# Patient Record
Sex: Female | Born: 1950
Health system: Southern US, Community
[De-identification: ages and names within clinical notes are randomized; demographics above are authoritative.]

## PROBLEM LIST (undated history)

## (undated) DIAGNOSIS — B019 Varicella without complication: Secondary | ICD-10-CM

## (undated) DIAGNOSIS — J189 Pneumonia, unspecified organism: Secondary | ICD-10-CM

## (undated) DIAGNOSIS — Z9889 Other specified postprocedural states: Secondary | ICD-10-CM

## (undated) DIAGNOSIS — K449 Diaphragmatic hernia without obstruction or gangrene: Secondary | ICD-10-CM

## (undated) DIAGNOSIS — K8020 Calculus of gallbladder without cholecystitis without obstruction: Secondary | ICD-10-CM

## (undated) DIAGNOSIS — I10 Essential (primary) hypertension: Secondary | ICD-10-CM

## (undated) DIAGNOSIS — T7840XA Allergy, unspecified, initial encounter: Secondary | ICD-10-CM

## (undated) DIAGNOSIS — D649 Anemia, unspecified: Secondary | ICD-10-CM

## (undated) DIAGNOSIS — K589 Irritable bowel syndrome without diarrhea: Secondary | ICD-10-CM

## (undated) DIAGNOSIS — K219 Gastro-esophageal reflux disease without esophagitis: Secondary | ICD-10-CM

## (undated) DIAGNOSIS — I272 Pulmonary hypertension, unspecified: Secondary | ICD-10-CM

## (undated) DIAGNOSIS — J302 Other seasonal allergic rhinitis: Secondary | ICD-10-CM

## (undated) DIAGNOSIS — K635 Polyp of colon: Secondary | ICD-10-CM

## (undated) DIAGNOSIS — M797 Fibromyalgia: Secondary | ICD-10-CM

## (undated) DIAGNOSIS — E039 Hypothyroidism, unspecified: Secondary | ICD-10-CM

## (undated) DIAGNOSIS — E041 Nontoxic single thyroid nodule: Secondary | ICD-10-CM

## (undated) DIAGNOSIS — Z91018 Allergy to other foods: Secondary | ICD-10-CM

## (undated) DIAGNOSIS — H269 Unspecified cataract: Secondary | ICD-10-CM

## (undated) DIAGNOSIS — E7800 Pure hypercholesterolemia, unspecified: Secondary | ICD-10-CM

## (undated) DIAGNOSIS — I499 Cardiac arrhythmia, unspecified: Secondary | ICD-10-CM

## (undated) DIAGNOSIS — K625 Hemorrhage of anus and rectum: Secondary | ICD-10-CM

## (undated) DIAGNOSIS — M1990 Unspecified osteoarthritis, unspecified site: Secondary | ICD-10-CM

## (undated) DIAGNOSIS — G43909 Migraine, unspecified, not intractable, without status migrainosus: Secondary | ICD-10-CM

## (undated) DIAGNOSIS — M8580 Other specified disorders of bone density and structure, unspecified site: Secondary | ICD-10-CM

## (undated) DIAGNOSIS — K5790 Diverticulosis of intestine, part unspecified, without perforation or abscess without bleeding: Secondary | ICD-10-CM

## (undated) DIAGNOSIS — R1310 Dysphagia, unspecified: Secondary | ICD-10-CM

## (undated) DIAGNOSIS — R112 Nausea with vomiting, unspecified: Secondary | ICD-10-CM

## (undated) HISTORY — DX: Dysphagia, unspecified: R13.10

## (undated) HISTORY — DX: Diaphragmatic hernia without obstruction or gangrene: K44.9

## (undated) HISTORY — PX: TONSILLECTOMY: SUR1361

## (undated) HISTORY — DX: Allergy to other foods: Z91.018

## (undated) HISTORY — DX: Nontoxic single thyroid nodule: E04.1

## (undated) HISTORY — DX: Diverticulosis of intestine, part unspecified, without perforation or abscess without bleeding: K57.90

## (undated) HISTORY — DX: Hemorrhage of anus and rectum: K62.5

## (undated) HISTORY — DX: Varicella without complication: B01.9

## (undated) HISTORY — PX: COLONOSCOPY: SHX174

## (undated) HISTORY — DX: Unspecified cataract: H26.9

## (undated) HISTORY — DX: Calculus of gallbladder without cholecystitis without obstruction: K80.20

## (undated) HISTORY — PX: COLON SURGERY: SHX602

## (undated) HISTORY — DX: Pulmonary hypertension, unspecified: I27.20

## (undated) HISTORY — PX: UPPER GI ENDOSCOPY: SHX6162

## (undated) HISTORY — DX: Other seasonal allergic rhinitis: J30.2

## (undated) HISTORY — DX: Gastro-esophageal reflux disease without esophagitis: K21.9

## (undated) HISTORY — PX: UPPER GASTROINTESTINAL ENDOSCOPY: SHX188

## (undated) HISTORY — DX: Other specified disorders of bone density and structure, unspecified site: M85.80

## (undated) HISTORY — DX: Unspecified osteoarthritis, unspecified site: M19.90

## (undated) HISTORY — DX: Pure hypercholesterolemia, unspecified: E78.00

## (undated) HISTORY — DX: Anemia, unspecified: D64.9

## (undated) HISTORY — DX: Migraine, unspecified, not intractable, without status migrainosus: G43.909

## (undated) HISTORY — DX: Polyp of colon: K63.5

## (undated) HISTORY — DX: Irritable bowel syndrome, unspecified: K58.9

## (undated) HISTORY — PX: ABDOMINAL HYSTERECTOMY: SHX81

## (undated) HISTORY — DX: Essential (primary) hypertension: I10

## (undated) HISTORY — PX: SMALL INTESTINE SURGERY: SHX150

## (undated) HISTORY — DX: Allergy, unspecified, initial encounter: T78.40XA

## (undated) HISTORY — DX: Hypothyroidism, unspecified: E03.9

## (undated) HISTORY — PX: OTHER SURGICAL HISTORY: SHX169

## (undated) HISTORY — DX: Fibromyalgia: M79.7

## (undated) HISTORY — PX: CATHETER REMOVAL: SHX911

---
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
## 1972-10-14 HISTORY — PX: TUBAL LIGATION: SHX77

## 1980-10-14 HISTORY — PX: APPENDECTOMY: SHX54

## 1986-10-14 HISTORY — PX: TOTAL ABDOMINAL HYSTERECTOMY W/ BILATERAL SALPINGOOPHORECTOMY: SHX83

## 1988-10-14 HISTORY — PX: COLON RESECTION: SHX5231

## 1989-10-14 HISTORY — PX: BREAST LUMPECTOMY: SHX2

## 1993-10-14 HISTORY — PX: BREAST EXCISIONAL BIOPSY: SUR124

## 2002-10-14 HISTORY — PX: NASAL SEPTUM SURGERY: SHX37

## 2003-05-12 ENCOUNTER — Encounter (INDEPENDENT_AMBULATORY_CARE_PROVIDER_SITE_OTHER): Payer: Self-pay

## 2003-05-12 ENCOUNTER — Ambulatory Visit (HOSPITAL_COMMUNITY): Admission: RE | Admit: 2003-05-12 | Discharge: 2003-05-12 | Payer: Self-pay | Admitting: Gastroenterology

## 2004-05-15 ENCOUNTER — Ambulatory Visit: Admission: RE | Admit: 2004-05-15 | Discharge: 2004-05-15 | Payer: Self-pay | Admitting: Pulmonary Disease

## 2004-06-07 ENCOUNTER — Ambulatory Visit (HOSPITAL_COMMUNITY): Admission: RE | Admit: 2004-06-07 | Discharge: 2004-06-07 | Payer: Self-pay | Admitting: Neurosurgery

## 2004-10-14 HISTORY — PX: CARPAL TUNNEL RELEASE: SHX101

## 2005-03-06 ENCOUNTER — Ambulatory Visit (HOSPITAL_COMMUNITY): Admission: RE | Admit: 2005-03-06 | Discharge: 2005-03-06 | Payer: Self-pay | Admitting: Gastroenterology

## 2005-03-06 ENCOUNTER — Encounter (INDEPENDENT_AMBULATORY_CARE_PROVIDER_SITE_OTHER): Payer: Self-pay | Admitting: Specialist

## 2005-04-05 ENCOUNTER — Encounter: Admission: RE | Admit: 2005-04-05 | Discharge: 2005-04-05 | Payer: Self-pay | Admitting: Gastroenterology

## 2005-09-12 ENCOUNTER — Other Ambulatory Visit: Admission: RE | Admit: 2005-09-12 | Discharge: 2005-09-12 | Payer: Self-pay | Admitting: Obstetrics and Gynecology

## 2006-02-27 ENCOUNTER — Encounter: Admission: RE | Admit: 2006-02-27 | Discharge: 2006-02-27 | Payer: Self-pay | Admitting: Gastroenterology

## 2006-05-01 ENCOUNTER — Encounter: Admission: RE | Admit: 2006-05-01 | Discharge: 2006-05-01 | Payer: Self-pay | Admitting: Neurosurgery

## 2006-05-01 HISTORY — PX: OTHER SURGICAL HISTORY: SHX169

## 2006-11-17 ENCOUNTER — Other Ambulatory Visit: Admission: RE | Admit: 2006-11-17 | Discharge: 2006-11-17 | Payer: Self-pay | Admitting: Family Medicine

## 2007-04-15 ENCOUNTER — Encounter: Admission: RE | Admit: 2007-04-15 | Discharge: 2007-04-15 | Payer: Self-pay | Admitting: Otolaryngology

## 2008-12-09 ENCOUNTER — Encounter: Admission: RE | Admit: 2008-12-09 | Discharge: 2008-12-09 | Payer: Self-pay | Admitting: Otolaryngology

## 2009-02-10 ENCOUNTER — Encounter: Admission: RE | Admit: 2009-02-10 | Discharge: 2009-02-10 | Payer: Self-pay | Admitting: Family Medicine

## 2009-12-19 ENCOUNTER — Encounter: Admission: RE | Admit: 2009-12-19 | Discharge: 2009-12-19 | Payer: Self-pay | Admitting: Family Medicine

## 2010-11-04 ENCOUNTER — Encounter: Payer: Self-pay | Admitting: Gastroenterology

## 2010-11-04 ENCOUNTER — Encounter: Payer: Self-pay | Admitting: Otolaryngology

## 2010-11-05 ENCOUNTER — Encounter: Payer: Self-pay | Admitting: Family Medicine

## 2010-11-21 ENCOUNTER — Other Ambulatory Visit: Payer: Self-pay

## 2010-11-21 ENCOUNTER — Other Ambulatory Visit: Payer: Self-pay | Admitting: Family Medicine

## 2010-11-21 DIAGNOSIS — Z1239 Encounter for other screening for malignant neoplasm of breast: Secondary | ICD-10-CM

## 2010-12-25 ENCOUNTER — Ambulatory Visit
Admission: RE | Admit: 2010-12-25 | Discharge: 2010-12-25 | Disposition: A | Discharge status: Home / Self Care | Payer: 59 | Source: Ambulatory Visit

## 2010-12-25 DIAGNOSIS — Z1239 Encounter for other screening for malignant neoplasm of breast: Secondary | ICD-10-CM

## 2011-03-01 NOTE — Op Note (Signed)
NAME:  Rita Levine, Rita Levine                          ACCOUNT NO.:  [id number]   MEDICAL RECORD NO.:  [id number]                   PATIENT TYPE:  OUT   LOCATION:  CARD                                 FACILITY:  [facility]   PHYSICIAN:  Oley Balm. Sung Amabile, M.D. [facility]          DATE OF BIRTH:  December 04, 1950   DATE OF PROCEDURE:  05/15/2004  DATE OF DISCHARGE:  05/15/2004                                 OPERATIVE REPORT   PROCEDURE:  Cardiopulmonary stress test.   INDICATION:  Unexplained dyspnea.   DESCRIPTION OF PROCEDURE:  Cardiopulmonary stress testing was intended to be  performed using the modified Bruce protocol and graded treadmill.  The  subject was able to perform pulmonary function tests but was unable to  maintain the mouthpiece during any exercise and developed a sense of panic  and shortness of breath.  Therefore, no further testing was performed.  Pulmonary function tests do reveal normal spirometry, normal lung volumes,  and normal diffusion capacity.   IMPRESSION:  Unable to perform cardiopulmonary stress testing due to  intolerance to the mouthpiece and due to anxiety.  Consideration could be  given to repeating the test using a face mask rather than a mouth piece.  This will be discussed further with Dr. Jayme Cloud.                                               Oley Balm Sung Amabile, M.D. [facility]    DBS/MEDQ  D:  05/20/2004  T:  05/20/2004  Job:  [id number]   cc:   Danice Goltz, M.D. [facility]

## 2011-03-01 NOTE — Op Note (Signed)
NAMESHANELL, ADEN                ACCOUNT NO.:  [id number]   MEDICAL RECORD NO.:  [id number]          PATIENT TYPE:  AMB   LOCATION:  ENDO                         FACILITY:  [facility]   PHYSICIAN:  Petra Kuba, M.D.    DATE OF BIRTH:  1951-10-10   DATE OF PROCEDURE:  DATE OF DISCHARGE:                                 OPERATIVE REPORT   Audio too short to transcribe (less than 5 seconds)      MEM/MEDQ  D:  03/06/2005  T:  03/06/2005  Job:  [id number]

## 2011-03-01 NOTE — Op Note (Signed)
   NAME:  Rita Levine, Rita Levine                          ACCOUNT NO.:  [id number]   MEDICAL RECORD NO.:  [id number]                   PATIENT TYPE:  AMB   LOCATION:  ENDO                                 FACILITY:  [facility]   PHYSICIAN:  Petra Kuba, M.D.                 DATE OF BIRTH:  [birth year]   DATE OF PROCEDURE:  05/12/2003  DATE OF DISCHARGE:                                 OPERATIVE REPORT   PROCEDURE:  Colonoscopy.   INDICATIONS FOR PROCEDURE:  Multiple abdominal complaints, history of colon  polyps.   Consent was signed after risks, benefits, methods, and options were  thoroughly discussed in the office.   MEDICATIONS:  Demerol 75, Versed 8.   DESCRIPTION OF PROCEDURE:  Rectal inspection was pertinent for external  hemorrhoids, small. Digital exam was negative. The pediatric video  adjustable colonoscope was inserted, fairly easily advanced around the colon  to the level of the ileocecal valve. Unfortunately at that point there was  some looping and to advance to the cecal pole required rolling her on her  back and finally on her right side. Other than melanosis coli and  diverticula, no abnormalities were seen on insertion. The cecum was  identified by the appendiceal orifice and the ileocecal valve. The prep was  fairly adequate. With lots of washing and suctioning, adequate visualization  was obtained. In the cecal pole, a 2 mm polyp was seen and on a low setting  was hot biopsied carefully x1. The scope was further withdrawn. Other than  the left greater than right diverticula and the significant melanosis coli,  no other polypoid lesions were seen as we slowly withdrew back to the  rectum. Anorectal pull-through and retroflexion confirmed the hemorrhoids  which were small. The scope was reinserted a short ways up the  left side of  the colon, air was suctioned, scope removed. The patient tolerated the  procedure well. There was no obvious or immediate complications.   ENDOSCOPIC DIAGNOSIS:  1. Internal and external small hemorrhoids.  2. Melanosis coli throughout.  3. Left greater than right diverticula.  4. Tiny cecal polyp hot biopsied.  5. Otherwise within normal limits to the cecum.   PLAN:  Await pathology and also get her [place] records to try to determine  when the appropriate time is for repeat screening and continue workup in the  meantime with an EGD. Please see that dictation for other recommendations,  workup and plans.                                               Petra Kuba, M.D.    MEM/MEDQ  D:  05/12/2003  T:  05/12/2003  Job:  [id number]   cc:   Okey Regal ______________

## 2011-03-01 NOTE — Op Note (Signed)
   NAME:  THEORA, VANKIRK                          ACCOUNT NO.:  [id number]   MEDICAL RECORD NO.:  [id number]                   PATIENT TYPE:  AMB   LOCATION:  ENDO                                 FACILITY:  [facility]   PHYSICIAN:  Petra Kuba, M.D.                 DATE OF BIRTH:  [birth year]   DATE OF PROCEDURE:  05/12/2003  DATE OF DISCHARGE:                                 OPERATIVE REPORT   PROCEDURE:  Esophagogastroduodenoscopy.   INDICATIONS FOR PROCEDURE:  Chronic upper tract symptoms.   Consent was signed after risks, benefits, methods, and options were  thoroughly discussed in the office. No additional medicines were used for  this procedure since it followed the colonoscopy.   DESCRIPTION OF PROCEDURE:  The video endoscope was inserted by direct  vision, the esophagus was normal, in the distal esophagus was a tiny hiatal  hernia. The scope passed into the stomach, advanced to the antrum where some  minimal antritis was seen and advanced through a normal duodenal bulb,  around the C loop to a normal second portion of the duodenum. Slow  withdrawal back to the bulb confirmed its normal appearance. The scope was  withdrawn back to the stomach which was evaluated on straight and  retroflexed visualization with a good look at the fundus, lesser and greater  curve without additional findings. Air was suctioned, scope slowly  withdrawn. Again a good look at the esophagus was normal, scope was removed.  The patient tolerated the procedure well. There was no obvious or immediate  complication.   ENDOSCOPIC DIAGNOSIS:  1. Tiny hiatal hernia.  2. Minimal antritis.  3. Otherwise normal esophagogastroduodenoscopy.   PLAN:  Continue pump inhibitors, happy to see back p.r.n.  Consideration of  small bowel follow-through or CAT scan if her symptoms continue. Could leave  that to Ms. Womble or happy to orchestrate on followup and see colon  dictation for other workup and plans.                                            Petra Kuba, M.D.    MEM/MEDQ  D:  05/12/2003  T:  05/12/2003  Job:  [id number]   cc:   Katrina Stack, M.D.

## 2011-03-01 NOTE — Op Note (Signed)
Rita Levine, Rita Levine                ACCOUNT NO.:  [id number]   MEDICAL RECORD NO.:  [id number]          PATIENT TYPE:  AMB   LOCATION:  ENDO                         FACILITY:  [facility]   PHYSICIAN:  Petra Kuba, M.D.    DATE OF BIRTH:  1951-05-31   DATE OF PROCEDURE:  03/06/2005  DATE OF DISCHARGE:                                 OPERATIVE REPORT   PROCEDURE:  Colonoscopy with polypectomy.   ENDOSCOPIST:  Petra Kuba, M.D.   INDICATIONS:  Patient with history of colon polyps, increasing abdominal  pain.  Consent was signed after risks, benefits, methods, and options  thoroughly discussed in the office in the past.   MEDICINES USED:  Demerol 75, Versed 8.   DESCRIPTION OF PROCEDURE:  Rectal inspection is pertinent for external  hemorrhoids, small.  A digital exam is negative.  Video pediatric adjustable  colonoscope was inserted and, again, fairly easily advanced around the colon  to the level of the ileocecal valve to advance to the cecal pole required  rolling her on her back and then on her right side with various abdominal  pressures.  The cecum was identified by the appendiceal orifice and the  ileocecal valve.  No obvious abnormality, but some scattered diverticula  were seen on insertion.   The scope was inserted a short ways into the terminal ileum which was  normal.  Photo documentation was obtained.  The scope was slowly withdrawn.  The prep was fairly adequate with lots of washing and suctioning, adequate  visualization was obtained.  On slow withdrawal through the colon noting the  scattered diverticula in the mid transverse colon.  A tiny-to-small polyp  was seen.  Snare electrocautery applied and the polyp was suctioned through  the scope and collected in the trap.  No other polypoid abnormalities were  seen as we slowly withdrew back to the rectum.  Anorectal pull through and  retroflexion confirms some small hemorrhoids.  The scope was straightened  and readvanced  a short ways up the left side of the colon. Air was suctioned  and the scope removed.  The patient tolerated the procedure well.  There  were no obvious immediate complications.   ENDOSCOPIC DIAGNOSIS:  1.  Internal and external hemorrhoids.  2.  Scattered diverticula throughout.  3.  Proximal transverse polyp, status post snare.  4.  Otherwise within normal limits to the terminal ileum.   PLAN:  Await pathology to determine future colonic screening.  Continue  workup with an EGD.      MEM/MEDQ  D:  03/06/2005  T:  03/06/2005  Job:  [id number]   cc:   Petra Kuba, M.D.  [address], [address]  [place]  [place] [address]  Fax: [phone]

## 2011-03-01 NOTE — Op Note (Signed)
Levine, KERN                ACCOUNT NO.:  [id number]   MEDICAL RECORD NO.:  [id number]          PATIENT TYPE:  AMB   LOCATION:  ENDO                         FACILITY:  [facility]   PHYSICIAN:  Petra Kuba, M.D.    DATE OF BIRTH:  11-27-1950   DATE OF PROCEDURE:  03/06/2005  DATE OF DISCHARGE:                                 OPERATIVE REPORT   PROCEDURE:  EGD with biopsy.   INDICATIONS FOR PROCEDURE:  Patient with chronic reflux, want to reevaluate.  Consent was signed after risks, benefits, methods and options were  thoroughly discussed in the office in the past and prior to any premeds  given.   ADDITIONAL MEDICINES FOR THIS PROCEDURE:  Versed 2.   PROCEDURE:  The video endoscope was inserted by direct vision.  Esophagus  was pertinent for some very minimal distal reflux changes but no significant  esophagus or signs of Barrett's.  She might have had a tiny hiatal hernia at  best.  Scope passed into the stomach, advanced to the antrum where some  minimal antritis was seen and advanced through a normal pylorus into a  normal duodenal bulb and around the C loop to a normal second portion of the  duodenum.  Scope was withdrawn back to the vault and a good look there ruled  out ulcers in that location.  Scope was withdrawn back to the stomach and  retroflexed.  Cardia, fundus, angularis, lesser and greater curve were  normal on retroflexed visualization.  Straight visualization in the stomach  did not reveal any additional findings.  Air was suctioned and the scope  slowly withdrawn.  Again, a good look at the esophagus did nontender reveal  any significant abnormalities.  We went ahead and took the few biopsies of  the distal and mid esophagus to rule out any microscopic changes.  Air was  suctioned.  The scope was slowly withdrawn.  Again, a good look at the  esophagus on withdrawal did not reveal any additional findings.  The scope  was removed.  Patient tolerated the procedure  well.  There was  noncontributory obvious immediate complications.   ENDOSCOPIC DIAGNOSES:  1.  Tiny hiatal hernia.  2.  Minimal distal reflux changes, status post biopsy including biopsies of      the mid esophagus which look normal.  3.  Minimal antritis.  4.  Otherwise normal esophagogastroduodenoscopy.   PLAN:  Await pathology.  For her abdominal pain, continue working with small-  bowel follow-through and CAT scan.  Also trial of antispasmodic would be  helpful.  Happy to see back p.r.n. to assist.  Otherwise return care to Dr.  Foy Guadalajara and Dr. Jearld Fenton.      MEM/MEDQ  D:  03/06/2005  T:  03/06/2005  Job:  [id number]   cc:   Molly Maduro L. Foy Guadalajara, M.D.  [address] [place]  [place] [address]  Fax: [phone]   Suzanna Obey, M.D.  [address] [place]  [place] [address]  Fax: [phone]

## 2011-03-01 NOTE — Op Note (Signed)
NAME:  Rita Levine, Rita Levine                          ACCOUNT NO.:  [id number]   MEDICAL RECORD NO.:  [id number]                   PATIENT TYPE:  OIB   LOCATION:  2899                                 FACILITY:  [facility]   PHYSICIAN:  Donalee Citrin, M.D.                     DATE OF BIRTH:  [birth year]   DATE OF PROCEDURE:  06/07/2004  DATE OF DISCHARGE:                                 OPERATIVE REPORT   PREOPERATIVE DIAGNOSIS:  Right carpal tunnel syndrome.   PROCEDURE:  Right carpal tunnel release.   SURGEON:  Donalee Citrin, M.D.   ANESTHESIA:  Bier block.   HISTORY OF PRESENT ILLNESS:  The patient is a very pleasant 60 year old  female who has had longstanding right hand and wrist pain with repetitive  motion.  EMG confirmed moderately severe right carpal tunnel syndrome.  The  patient was refractory to all forms of conservative treatment and was  recommended carpal tunnel release.  I extensively went over the risks and  benefits of surgery with her, and she understands and agreed to proceed  forward.   The patient was brought into the OR, was induced under Bier block  anesthesia.  The right hand was prepped and draped in the usual sterile  fashion.  An incision was made just from the distal wrist crease along the  palmar crease approximately 4 cm long.  The incision was made with a 15  blade scalpel and then the soft tissue was dissected free.  The retinaculum  was immediately identified, and this was incised with a 15 blade scalpel.  Then once the perineural sheath was identified, using a hemostat the  retinaculum was dissected free of the perineural sheath and incised both  proximally and distally.  The hemostats were easily passed both proximally  and distally with no further tension being appreciated.  Once the  retinaculum had been divided, there was no further pressure on the median  nerve.  Care was taken not to violate the perineural sheath.  The wound was  then copiously irrigated.   Meticulous hemostasis was maintained.  The skin  was then reapproximated with an interrupted vertical mattress.  The  tourniquet was released with approximately 35-minute tourniquet time, and  then the wound was dressed.  The patient went to the recovery room in stable  condition.  At the end of the case all needle count and sponge count  __________.                                               Donalee Citrin, M.D.    GC/MEDQ  D:  06/07/2004  T:  06/07/2004  Job:  [id number]

## 2011-03-06 ENCOUNTER — Other Ambulatory Visit: Payer: Self-pay | Admitting: Orthopedic Surgery

## 2011-03-06 ENCOUNTER — Ambulatory Visit
Admission: RE | Admit: 2011-03-06 | Discharge: 2011-03-06 | Disposition: A | Discharge status: Home / Self Care | Payer: 59 | Source: Ambulatory Visit | Attending: Orthopedic Surgery | Admitting: Orthopedic Surgery

## 2011-03-06 DIAGNOSIS — G8929 Other chronic pain: Secondary | ICD-10-CM

## 2011-03-08 ENCOUNTER — Institutional Professional Consult (permissible substitution): Payer: 59 | Admitting: Pulmonary Disease

## 2011-03-29 ENCOUNTER — Encounter: Payer: Self-pay | Admitting: Pulmonary Disease

## 2011-03-29 ENCOUNTER — Encounter: Payer: Self-pay | Admitting: *Deleted

## 2011-04-01 ENCOUNTER — Ambulatory Visit (INDEPENDENT_AMBULATORY_CARE_PROVIDER_SITE_OTHER): Payer: 59 | Admitting: Pulmonary Disease

## 2011-04-01 ENCOUNTER — Encounter: Payer: Self-pay | Admitting: Pulmonary Disease

## 2011-04-01 ENCOUNTER — Institutional Professional Consult (permissible substitution): Payer: 59 | Admitting: Pulmonary Disease

## 2011-04-01 VITALS — BP 118/74 | HR 73 | Temp 98.7°F | Ht 60.0 in | Wt 148.4 lb

## 2011-04-01 DIAGNOSIS — J45909 Unspecified asthma, uncomplicated: Secondary | ICD-10-CM | POA: Insufficient documentation

## 2011-04-01 NOTE — Patient Instructions (Signed)
Continue with advair.  Your spirometry is normal today, indicating the inhaler is doing a good job controlling your airway inflammation. Would recommend getting back with your ENT doctor.  Your asthma will not stay controlled if your sinuses keep flaring.  Stay on reflux meds. Would be happy to see you back if you have pulmonary issues despite control of your sinus disease.

## 2011-04-01 NOTE — Progress Notes (Signed)
  Subjective:    Patient ID: Rita Levine, female    DOB: August 30, 1951, 60 y.o.   MRN: [id number]  HPI The pt is a 60y/o female who I have been asked to see for asthma.  She was diagnosed with asthma about 21yrs ago, and feels the advair controls her symptoms very well unless her sinuses are causing her trouble.  The majority of her flares start with sinus issues.  She is followed by ENT, and most recently has required 2 rounds of abx and a course of prednisone for sinus/chest symptoms.  She is still having sinus pressure, and feels this is still an issue for her.  She feels she has reasonable exercise tolerance, and states that breathing does not limit her.  She has been allergy tested in the past, and currently sees an Administrator, [employer]" in [place].     Review of Systems  Constitutional: Negative for fever and unexpected weight change.  HENT: Positive for ear pain, congestion, sore throat and sneezing. Negative for nosebleeds, rhinorrhea, trouble swallowing, dental problem, postnasal drip and sinus pressure.   Eyes: Negative for redness and itching.  Respiratory: Positive for cough and shortness of breath. Negative for chest tightness and wheezing.   Cardiovascular: Negative for palpitations and leg swelling.  Gastrointestinal: Negative for nausea and vomiting.  Genitourinary: Negative for dysuria.  Musculoskeletal: Negative for joint swelling.  Skin: Negative for rash.  Neurological: Positive for headaches.  Hematological: Does not bruise/bleed easily.  Psychiatric/Behavioral: Negative for dysphoric mood. The patient is not nervous/anxious.        Objective:   Physical Exam Constitutional:  Well developed, no acute distress  HENT:  Nares patent without discharge  Oropharynx without exudate, palate and uvula are normal  Eyes:  Perrla, eomi, no scleral icterus  Neck:  No JVD, no TMG  Cardiovascular:  Normal rate, regular rhythm, no rubs or gallops.  No murmurs        Intact distal  pulses  Pulmonary :  Normal breath sounds, no stridor or respiratory distress   No rales, rhonchi, or wheezing  Abdominal:  Soft, nondistended, bowel sounds present.  No tenderness noted.   Musculoskeletal:  No lower extremity edema noted.  Lymph Nodes:  No cervical lymphadenopathy noted  Skin:  No cyanosis noted  Neurologic:  Alert, appropriate, moves all 4 extremities without obvious deficit.         Assessment & Plan:

## 2011-04-06 NOTE — Assessment & Plan Note (Signed)
The pt feels her current inhaler regimen controls her asthma quite well when her sinus disease is not causing an issue.  Her spirometry today is normal, indicating her asthma is really not an issue right now.  She is still having sinus symptoms despite being treated with 2 rounds of abx and a course of prednisone, and I think she may need ENT evaluation at this point.  She will call for an apptm.  I have discussed with her the role of sinusitis in overall airway inflammation.

## 2011-09-18 ENCOUNTER — Other Ambulatory Visit (INDEPENDENT_AMBULATORY_CARE_PROVIDER_SITE_OTHER): Payer: Self-pay | Admitting: Otolaryngology

## 2011-09-30 ENCOUNTER — Other Ambulatory Visit: Payer: 59

## 2011-10-11 ENCOUNTER — Other Ambulatory Visit: Payer: 59

## 2011-10-11 ENCOUNTER — Ambulatory Visit
Admission: RE | Admit: 2011-10-11 | Discharge: 2011-10-11 | Disposition: A | Discharge status: Home / Self Care | Payer: 59 | Source: Ambulatory Visit | Attending: Otolaryngology | Admitting: Otolaryngology

## 2011-10-23 ENCOUNTER — Other Ambulatory Visit: Payer: Self-pay | Admitting: Family Medicine

## 2011-10-23 DIAGNOSIS — Z1231 Encounter for screening mammogram for malignant neoplasm of breast: Secondary | ICD-10-CM

## 2011-11-08 ENCOUNTER — Other Ambulatory Visit (INDEPENDENT_AMBULATORY_CARE_PROVIDER_SITE_OTHER): Payer: Self-pay | Admitting: Otolaryngology

## 2011-12-31 ENCOUNTER — Ambulatory Visit
Admission: RE | Admit: 2011-12-31 | Discharge: 2011-12-31 | Disposition: A | Discharge status: Home / Self Care | Payer: 59 | Source: Ambulatory Visit | Attending: Family Medicine | Admitting: Family Medicine

## 2011-12-31 DIAGNOSIS — Z1231 Encounter for screening mammogram for malignant neoplasm of breast: Secondary | ICD-10-CM

## 2012-04-13 HISTORY — PX: OTHER SURGICAL HISTORY: SHX169

## 2012-06-12 ENCOUNTER — Other Ambulatory Visit (HOSPITAL_COMMUNITY): Payer: Self-pay | Admitting: Orthopedic Surgery

## 2012-06-12 DIAGNOSIS — M549 Dorsalgia, unspecified: Secondary | ICD-10-CM

## 2012-06-23 ENCOUNTER — Encounter (HOSPITAL_COMMUNITY)
Admission: RE | Admit: 2012-06-23 | Discharge: 2012-06-23 | Disposition: A | Discharge status: Home / Self Care | Payer: 59 | Source: Ambulatory Visit | Attending: Orthopedic Surgery | Admitting: Orthopedic Surgery

## 2012-06-23 DIAGNOSIS — M549 Dorsalgia, unspecified: Secondary | ICD-10-CM

## 2012-06-23 DIAGNOSIS — R079 Chest pain, unspecified: Secondary | ICD-10-CM | POA: Insufficient documentation

## 2012-06-23 MED ORDER — TECHNETIUM TC 99M MEDRONATE IV KIT
25.0000 | PACK | Freq: Once | INTRAVENOUS | Status: AC | PRN
  Administered 2012-06-23: 25 via INTRAVENOUS

## 2012-11-02 ENCOUNTER — Other Ambulatory Visit (HOSPITAL_BASED_OUTPATIENT_CLINIC_OR_DEPARTMENT_OTHER): Payer: Self-pay | Admitting: Family Medicine

## 2012-11-02 DIAGNOSIS — Z1231 Encounter for screening mammogram for malignant neoplasm of breast: Secondary | ICD-10-CM

## 2012-12-31 ENCOUNTER — Ambulatory Visit (HOSPITAL_BASED_OUTPATIENT_CLINIC_OR_DEPARTMENT_OTHER)
Admission: RE | Admit: 2012-12-31 | Discharge: 2012-12-31 | Disposition: A | Discharge status: Home / Self Care | Payer: 59 | Source: Ambulatory Visit | Attending: Family Medicine | Admitting: Family Medicine

## 2012-12-31 DIAGNOSIS — Z1231 Encounter for screening mammogram for malignant neoplasm of breast: Secondary | ICD-10-CM | POA: Insufficient documentation

## 2013-11-26 ENCOUNTER — Other Ambulatory Visit: Payer: Self-pay

## 2013-11-26 DIAGNOSIS — Z1231 Encounter for screening mammogram for malignant neoplasm of breast: Secondary | ICD-10-CM

## 2014-01-03 ENCOUNTER — Ambulatory Visit: Payer: 59

## 2014-01-04 ENCOUNTER — Ambulatory Visit
Admission: RE | Admit: 2014-01-04 | Discharge: 2014-01-04 | Disposition: A | Discharge status: Home / Self Care | Payer: Self-pay | Source: Ambulatory Visit

## 2014-01-04 DIAGNOSIS — Z1231 Encounter for screening mammogram for malignant neoplasm of breast: Secondary | ICD-10-CM

## 2014-04-05 ENCOUNTER — Encounter: Payer: Self-pay | Admitting: Cardiology

## 2014-04-07 ENCOUNTER — Ambulatory Visit (INDEPENDENT_AMBULATORY_CARE_PROVIDER_SITE_OTHER): Payer: 59 | Admitting: Interventional Cardiology

## 2014-04-07 ENCOUNTER — Encounter: Payer: Self-pay | Admitting: Interventional Cardiology

## 2014-04-07 VITALS — BP 126/78 | HR 62 | Ht 60.0 in | Wt 152.0 lb

## 2014-04-07 DIAGNOSIS — R002 Palpitations: Secondary | ICD-10-CM | POA: Insufficient documentation

## 2014-04-07 DIAGNOSIS — R079 Chest pain, unspecified: Secondary | ICD-10-CM

## 2014-04-07 DIAGNOSIS — E782 Mixed hyperlipidemia: Secondary | ICD-10-CM | POA: Insufficient documentation

## 2014-04-07 HISTORY — DX: Chest pain, unspecified: R07.9

## 2014-04-07 NOTE — Progress Notes (Signed)
Patient ID: ADRIEANA Levine, female   DOB: [birth year], 63 y.o.   MRN: [id number]     Patient ID: Rita Levine MRN: [id number] DOB/AGE: 31-Mar-1951 63 y.o.   Referring Physician Dr. Betty Martinique   Reason for Consultation: chest pressure  HPI: 63 y/o woman who has had high cholesterol.  Over the past six months, she has had some chest discomfort and associated arm tingling.  Sx occur about once a week.  They last a few minutes.  No pattern.  She has some nauseated and diaphoresis with the chest discomfort.  No relation to exertion.  No syncope.  Occasional lightheadedness with standing.  She has "bad equilibrium."  She describes palpitations.  Events occur at random-lasting a few seconds.  Rare episodes were she had to sit down in the past.  None recently.  She has had borderline blood sugar.   Mother had CHF.  No early CAD.  Siblings have no early CAD.    Current Outpatient Prescriptions  Medication Sig Dispense Refill  . albuterol (PROAIR HFA) 108 (90 BASE) MCG/ACT inhaler Inhale 2 puffs into the lungs every 6 (six) hours as needed.        Marland Kitchen albuterol (PROVENTIL) (2.5 MG/3ML) 0.083% nebulizer solution Take 2.5 mg by nebulization every 6 (six) hours as needed.        . Ascorbic Acid (VITAMIN C) 1000 MG tablet Take 3,000 mg by mouth daily.       Marland Kitchen atenolol (TENORMIN) 25 MG tablet Take 25 mg by mouth daily.        . Coenzyme Q10 (COQ-10) 100 MG CAPS Take 3 tablets by mouth daily.        Marland Kitchen dexlansoprazole (DEXILANT) 60 MG capsule Take 60 mg by mouth daily.      . Digestive Enzymes (SIMILASE PO) daily.        . fish oil-omega-3 fatty acids 1000 MG capsule Take 1 g by mouth daily.        . fluticasone (FLONASE) 50 MCG/ACT nasal spray Place 2 sprays into the nose daily.        . Fluticasone-Salmeterol (ADVAIR DISKUS) 250-50 MCG/DOSE AEPB Inhale 1 puff into the lungs. Once to twice daily      . Lactobacillus (ACIDOPHILUS PO) Take 1 capsule by mouth daily.        Marland Kitchen loratadine (CLARITIN) 10 MG  tablet Take 10 mg by mouth daily.      . montelukast (SINGULAIR) 10 MG tablet Take 10 mg by mouth at bedtime.      . Multiple Vitamin (MULTIVITAMIN) tablet Take 1 tablet by mouth 2 (two) times daily.       . NON FORMULARY Inhalent drops 3 times daily       . NON FORMULARY Glucose Optimizer 3 tabs daily       . Nutritional Supplements (DHEA PO) Take by mouth. 12.5 mg daily       . Nutritional Supplements (FOOD ALLERGY FORMULA PO) Take by mouth. 3 drops daily       . omeprazole (PRILOSEC) 40 MG capsule Take 40 mg by mouth 2 (two) times daily.        . pregabalin (LYRICA) 50 MG capsule Take 50 mg by mouth. Take 1 to 2 tabs daily      . PROGESTERONE MICRONIZED PO Take 75 mg by mouth daily.        . vitamin B-12 (CYANOCOBALAMIN) 1000 MCG tablet Take 1,000 mcg by mouth daily.        Marland Kitchen  zolpidem (AMBIEN) 10 MG tablet Take 10 mg by mouth at bedtime.         No current facility-administered medications for this visit.   Past Medical History  Diagnosis Date  . Reflux   . Hiatal hernia   . Migraine headache   . Fibromyalgia   . Hypercholesteremia   . Asthma   . Osteoarthritis   . Pulmonary hypertension   . Diverticulosis   . Allergic rhinitis     Family History  Problem Relation Age of Onset  . Hyperlipidemia Father   . Hypertension Father   . Arthritis Father   . Diabetes Father   . Hypertension Mother   . Hyperlipidemia Mother   . Macular degeneration Mother   . Arthritis Mother   . Throat cancer Brother     lung, and tongue  . Diabetes type II Sister   . Lung cancer Sister   . Breast cancer Maternal Aunt   . Lung cancer Other     uncle  . Emphysema Maternal Aunt   . Emphysema Maternal Uncle   . Asthma Father   . Clotting disorder Sister     History   Social History  . Marital Status: Married    Spouse Name: Windy Canny    Number of Children: 2  . Years of Education: N/A   Occupational History  . homemaker    Social History Main Topics  . Smoking status: Never  Smoker   . Smokeless tobacco: Not on file  . Alcohol Use: No  . Drug Use: No  . Sexual Activity: Not on file   Other Topics Concern  . Not on file   Social History Narrative  . No narrative on file    Past Surgical History  Procedure Laterality Date  . Carpal tunnel release  2006    Right wrist  . Appendectomy  1982  . Colon resection  1990  . Breast lumpectomy  1991  . Cesarean section      x 2  . Total abdominal hysterectomy w/ bilateral salpingoophorectomy  1988  . Tubal ligation  1974      (Not in a hospital admission)  Review of systems complete and found to be negative unless listed above .  No nausea, vomiting.  No fever chills, No focal weakness,  Rare palpitations.  Physical Exam: Filed Vitals:   04/07/14 0904  BP: 126/78  Pulse: 62    Weight: 152 lb (68.947 kg)  Physical exam:  /AT EOMI No JVD, No carotid bruit RRR S1S2  No wheezing Soft. NT, nondistended No edema. 2+ PT pulses bilaterally No focal motor or sensory deficits Normal affect  Labs:   No results found for this basename: WBC, HGB, HCT, MCV, PLT   No results found for this basename: NA, K, CL, CO2, BUN, CREATININE, CALCIUM, LABALBU, PROT, BILITOT, ALKPHOS, ALT, AST, GLUCOSE,  in the last 168 hours No results found for this basename: CKTOTAL, CKMB, CKMBINDEX, TROPONINI    No results found for this basename: CHOL   No results found for this basename: HDL   No results found for this basename: LDLCALC   No results found for this basename: TRIG   No results found for this basename: CHOLHDL   No results found for this basename: LDLDIRECT       EKG:  NSR, Q wave in lead 3, no ST segment changes.  ASSESSMENT AND PLAN:   Signed:  1) Chest pain:  Plan ETT.  Atypical chest pain.  Some features concerning for angina.  Ischemia evaluation is reasonable.  2) Hyperlipidemia: LDL 165 after dietary changes.  We discussed lipid-lowering therapy with statin. She is not interested in a  statin at this time. She will increase fish oil to 2 g by mouth twice a day.   3) Palpitations: Not as severe recently. Would not plan any monitor at this time. No syncope.   Mina Marble, MD, [facility] 04/07/2014, 9:17 AM

## 2014-04-07 NOTE — Patient Instructions (Signed)
Your physician has requested that you have an exercise tolerance test. For further information please visit [email or web address]. Please also follow instruction sheet, as given.   

## 2014-04-12 ENCOUNTER — Other Ambulatory Visit (HOSPITAL_BASED_OUTPATIENT_CLINIC_OR_DEPARTMENT_OTHER): Payer: Self-pay | Admitting: Family Medicine

## 2014-04-12 DIAGNOSIS — R109 Unspecified abdominal pain: Secondary | ICD-10-CM

## 2014-04-18 ENCOUNTER — Ambulatory Visit (HOSPITAL_BASED_OUTPATIENT_CLINIC_OR_DEPARTMENT_OTHER)
Admission: RE | Admit: 2014-04-18 | Discharge: 2014-04-18 | Disposition: A | Discharge status: Home / Self Care | Payer: 59 | Source: Ambulatory Visit | Attending: Family Medicine | Admitting: Family Medicine

## 2014-04-18 ENCOUNTER — Encounter (HOSPITAL_BASED_OUTPATIENT_CLINIC_OR_DEPARTMENT_OTHER): Payer: Self-pay

## 2014-04-18 DIAGNOSIS — K5730 Diverticulosis of large intestine without perforation or abscess without bleeding: Secondary | ICD-10-CM | POA: Insufficient documentation

## 2014-04-18 DIAGNOSIS — R109 Unspecified abdominal pain: Secondary | ICD-10-CM | POA: Insufficient documentation

## 2014-04-18 MED ORDER — IOHEXOL 300 MG/ML  SOLN
100.0000 mL | Freq: Once | INTRAMUSCULAR | Status: AC | PRN
  Administered 2014-04-18: 100 mL via INTRAVENOUS

## 2014-05-06 ENCOUNTER — Telehealth (HOSPITAL_COMMUNITY): Payer: Self-pay

## 2014-05-06 NOTE — Telephone Encounter (Signed)
Encounter complete. 

## 2014-05-11 ENCOUNTER — Ambulatory Visit (HOSPITAL_COMMUNITY)
Admission: RE | Admit: 2014-05-11 | Discharge: 2014-05-11 | Disposition: A | Discharge status: Home / Self Care | Payer: 59 | Source: Ambulatory Visit | Attending: Cardiovascular Disease | Admitting: Cardiovascular Disease

## 2014-05-11 DIAGNOSIS — Z0389 Encounter for observation for other suspected diseases and conditions ruled out: Secondary | ICD-10-CM | POA: Insufficient documentation

## 2014-05-11 DIAGNOSIS — R079 Chest pain, unspecified: Secondary | ICD-10-CM | POA: Diagnosis present

## 2014-05-11 NOTE — Procedures (Signed)
Exercise Treadmill Test  Pre-Exercise Testing Evaluation   Test  Exercise Tolerance Test Ordering MD: Larae Grooms    Unique Test No: 1   Treadmill:  1  Indication for ETT: chest pain - rule out ischemia  Contraindication to ETT: No   Stress Modality: exercise - treadmill  Cardiac Imaging Performed: non   Protocol: standard Bruce - maximal  Max BP:  215/84  Max MPHR (bpm):  157 85% MPR (bpm):  133  MPHR obtained (bpm):  166 % MPHR obtained:  105  Reached 85% MPHR (min:sec):  1:20 Total Exercise Time (min-sec):  6  Workload in METS:  7.0 Borg Scale: 15  Reason ETT Terminated:  dyspnea    ST Segment Analysis At Rest: normal ST segments - no evidence of significant ST depression With Exercise: no evidence of significant ST depression  Other Information Arrhythmia:  No Angina during ETT:  absent (0) Quality of ETT:  diagnostic  ETT Interpretation:  normal - no evidence of ischemia by ST analysis  Comments: The patient had a moderate exercise tolerance.  There was no chest pain.  There was an increased level of dyspnea.  There were no arrhythmias, a normal heart rate response and accelerated BP response.  There were no ischemic ST T wave changes.   Recommendations: Negative adequate ETT.  Hypertensive BP response.  Duke score 6.

## 2014-11-29 ENCOUNTER — Other Ambulatory Visit: Payer: Self-pay

## 2014-11-29 DIAGNOSIS — Z1231 Encounter for screening mammogram for malignant neoplasm of breast: Secondary | ICD-10-CM

## 2015-01-16 ENCOUNTER — Ambulatory Visit
Admission: RE | Admit: 2015-01-16 | Discharge: 2015-01-16 | Disposition: A | Discharge status: Home / Self Care | Payer: 59 | Source: Ambulatory Visit

## 2015-01-16 ENCOUNTER — Ambulatory Visit: Payer: Self-pay

## 2015-01-16 DIAGNOSIS — Z1231 Encounter for screening mammogram for malignant neoplasm of breast: Secondary | ICD-10-CM

## 2015-01-17 ENCOUNTER — Ambulatory Visit: Payer: Self-pay

## 2016-01-25 ENCOUNTER — Other Ambulatory Visit: Payer: Self-pay

## 2016-01-25 DIAGNOSIS — Z1231 Encounter for screening mammogram for malignant neoplasm of breast: Secondary | ICD-10-CM

## 2016-02-13 ENCOUNTER — Encounter: Payer: Self-pay | Admitting: Gastroenterology

## 2016-02-14 ENCOUNTER — Ambulatory Visit
Admission: RE | Admit: 2016-02-14 | Discharge: 2016-02-14 | Disposition: A | Discharge status: Home / Self Care | Payer: 59 | Source: Ambulatory Visit

## 2016-02-14 DIAGNOSIS — Z1231 Encounter for screening mammogram for malignant neoplasm of breast: Secondary | ICD-10-CM

## 2016-02-27 ENCOUNTER — Telehealth: Payer: Self-pay | Admitting: Gastroenterology

## 2016-02-27 NOTE — Telephone Encounter (Signed)
5/16 referral routed to Patty to see if we can get patient in sooner.

## 2016-04-15 ENCOUNTER — Encounter: Payer: Self-pay | Admitting: Gastroenterology

## 2016-04-15 ENCOUNTER — Ambulatory Visit (INDEPENDENT_AMBULATORY_CARE_PROVIDER_SITE_OTHER): Payer: 59 | Admitting: Gastroenterology

## 2016-04-15 VITALS — BP 130/80 | HR 68 | Ht 60.0 in | Wt 147.4 lb

## 2016-04-15 DIAGNOSIS — R1013 Epigastric pain: Secondary | ICD-10-CM | POA: Diagnosis not present

## 2016-04-15 NOTE — Patient Instructions (Addendum)
We will get records sent from your previous gastroenterologist Dr. Lavina Hamman [facility]) for review.  This will include any endoscopic (colonoscopy or upper endoscopy) procedures and any associated pathology reports.  Will decide on repeating colonoscopy/EGD after record review. You will be set up for an ultrasound for epigastric pains, intermittent. Please start taking citrucel (orange flavored) powder fiber supplement.  This may cause some bloating at first but that usually goes away. Begin with a small spoonful and work your way up to a large, heaping spoonful daily over a week.   You have been scheduled for an abdominal ultrasound at [facility] (1st floor of hospital) on 04/22/16 at 9:00AM. Please arrive 15 minutes prior to your appointment for registration. Make certain not to have anything to eat or drink 6 hours prior to your appointment. Should you need to reschedule your appointment, please contact radiology at [phone]. This test typically takes about 30 minutes to perform.    I appreciate the opportunity to care for you.

## 2016-04-15 NOTE — Progress Notes (Signed)
HPI: This is a   very pleasant 65 year old woman    who was referred to me by Briscoe Deutscher, MD  to evaluate  epigastric pain, alternating bowel habits, personal history of polyps, GERD .    Chief complaint is epigastric pain, GERD, personal history of colon polyps, alternating bowel habits  Blood work 01/2016: cbc normal, cmet normal.  Lipids elevated.  She says she is "past due" for colonoscopy and EGD.   No definite colon cancer in family.  She had segmental colectomy 20 years ago for diverticular disease.  She believes she had polyps in her colon; Audrie Gallus at [place] (in past years).  She has a hiatal hernia, has has intermittent severe pains in epigastrium.  These intermittent epigastric pains.  These can last 30 min.  She took an ASA and then metamucil.    She will have about once weekly epigastric pains, + nausea.  Not always associated with eating.  Under a lot of stress.  She rarely gets pyrosis.  She takes protonix daily; takes this in AM before BF and also bedtime ranitidine.  She has occasional dysphagia., chronic.  Gaining weight.  Tends to have alternating bowels, constipation/loose stools.  Never constipated more than 1-2 days.   Had 2 months of black stools.  She does not take fiber supplements regularly.  Review of systems: Pertinent positive and negative review of systems were noted in the above HPI section. Complete review of systems was performed and was otherwise normal.   Past Medical History  Diagnosis Date  . Reflux   . Hiatal hernia   . Migraine headache   . Fibromyalgia   . Hypercholesteremia   . Asthma   . Osteoarthritis   . Pulmonary hypertension ([place])   . Diverticulosis   . Allergic rhinitis   . Anemia   . Colon polyp   . GERD (gastroesophageal reflux disease)   . Hypothyroidism     Past Surgical History  Procedure Laterality Date  . Carpal tunnel release  2006    Right wrist  . Appendectomy  1982  . Colon resection  1990   . Breast lumpectomy  1991  . Cesarean section      x 2  . Total abdominal hysterectomy w/ bilateral salpingoophorectomy  1988  . Tubal ligation  1974    Current Outpatient Prescriptions  Medication Sig Dispense Refill  . albuterol (PROAIR HFA) 108 (90 BASE) MCG/ACT inhaler Inhale 2 puffs into the lungs every 6 (six) hours as needed.      Marland Kitchen albuterol (PROVENTIL) (2.5 MG/3ML) 0.083% nebulizer solution Take 2.5 mg by nebulization every 6 (six) hours as needed.      Francia Greaves THYROID 15 MG tablet     . ARMOUR THYROID 30 MG tablet     . Ascorbic Acid (VITAMIN C) 1000 MG tablet Take 3,000 mg by mouth daily.     Marland Kitchen atenolol (TENORMIN) 25 MG tablet Take 25 mg by mouth daily.      . CHLORDIAZEPOXIDE-CLIDINIUM PO Take by mouth.    . Digestive Enzymes (SIMILASE PO) daily.      . fish oil-omega-3 fatty acids 1000 MG capsule Take 1 g by mouth daily.      . fluticasone (FLONASE) 50 MCG/ACT nasal spray Place 2 sprays into the nose daily.      . Fluticasone-Salmeterol (ADVAIR DISKUS) 250-50 MCG/DOSE AEPB Inhale 1 puff into the lungs. Once to twice daily    . Lactobacillus (ACIDOPHILUS PO) Take 1 capsule by  mouth daily.      Marland Kitchen loratadine (CLARITIN) 10 MG tablet Take 10 mg by mouth daily.    . montelukast (SINGULAIR) 10 MG tablet Take 10 mg by mouth at bedtime.    . Multiple Vitamin (MULTIVITAMIN) tablet Take 1 tablet by mouth 2 (two) times daily.     . Nutritional Supplements (DHEA PO) Take by mouth. 12.5 mg daily     . PROGESTERONE MICRONIZED PO Take 75 mg by mouth daily.      . vitamin B-12 (CYANOCOBALAMIN) 1000 MCG tablet Take 1,000 mcg by mouth daily.      Marland Kitchen zolpidem (AMBIEN) 10 MG tablet Take 5 mg by mouth at bedtime.      No current facility-administered medications for this visit.    Allergies as of 04/15/2016 - Review Complete 04/15/2016  Allergen Reaction Noted  . Avelox [moxifloxacin hcl in nacl]  03/29/2011  . Cephalexin Hives 03/29/2011  . Codeine  03/29/2011  . Erythromycin Hives  03/29/2011  . Flagyl [metronidazole hcl]  03/29/2011  . Propranolol Hives 04/15/2016  . Septra [bactrim] Hives 03/29/2011  . Sulfa antibiotics Hives 03/29/2011  . Tetanus toxoids  03/29/2011  . Tramadol Hives 04/05/2014    Family History  Problem Relation Age of Onset  . Hyperlipidemia Father   . Hypertension Father   . Arthritis Father   . Diabetes Father   . Hypertension Mother   . Hyperlipidemia Mother   . Macular degeneration Mother   . Arthritis Mother   . Throat cancer Brother     lung, and tongue  . Diabetes type II Sister   . Lung cancer Sister   . Breast cancer Maternal Aunt   . Lung cancer Other     uncle  . Emphysema Maternal Aunt   . Emphysema Maternal Uncle   . Asthma Father   . Clotting disorder Sister     Social History   Social History  . Marital Status: Married    Spouse Name: Windy Canny  . Number of Children: 2  . Years of Education: N/A   Occupational History  . homemaker    Social History Main Topics  . Smoking status: Never Smoker   . Smokeless tobacco: Never Used  . Alcohol Use: No  . Drug Use: No  . Sexual Activity: Not on file   Other Topics Concern  . Not on file   Social History Narrative     Physical Exam: BP 130/80 mmHg  Pulse 68  Ht 5' (1.524 m)  Wt 147 lb 6.4 oz (66.86 kg)  BMI 28.79 kg/m2 Constitutional: generally well-appearing Psychiatric: alert and oriented x3 Eyes: extraocular movements intact Mouth: oral pharynx moist, no lesions Neck: supple no lymphadenopathy Cardiovascular: heart regular rate and rhythm Lungs: clear to auscultation bilaterally Abdomen: soft, nontender, nondistended, no obvious ascites, no peritoneal signs, normal bowel sounds Extremities: no lower extremity edema bilaterally Skin: no lesions on visible extremities   Assessment and plan: 65 y.o. female with  epigastric pain, GERD, personal history of colon polyps, alternating bowel habits  First she has intermittent epigastric pain  associated with nausea. These are discrete episodes and I think it seems more likely that these are gallstone related than GERD, hiatal hernia related. She takes proton pump inhibitor once daily and H2 blocker at night and on that she has no classic GERD symptoms. I recommended abdominal ultrasound for her to check for gallstones and if there are gallstones in her gallbladder I will likely refer her to general surgery  consider cholecystectomy. She has alternating constipation, loose stools and has never really tried fiber supplements on a daily basis but she will do so now. We will get records sent here for review including her most recent colonoscopy, upper endoscopy reports and after reviewing those I will recommend timing for her next surveillance, screening exam.   Owens Loffler, MD [facility] 04/15/2016, 11:27 AM  Cc: Briscoe Deutscher, MD

## 2016-04-19 ENCOUNTER — Telehealth: Payer: Self-pay | Admitting: Gastroenterology

## 2016-04-19 NOTE — Telephone Encounter (Signed)
Colonoscopy Dr. Lavina Hamman 07/2011 at [facility]) done for "screening... Last colonoscopy: 1993."  Findings diverticulosis throughout the colon, internal hemorrhoids, no polyps. He recommended repeat colonoscopy in 10 years EGD Dr. Lavina Hamman 07/2011 done for "epigastric abd pain, RUQ pain." Findings small HH, otherwise normal. He recommended abd [place]   Please let her know I reviewed her 2012 procedures.  Put her in for recall colonoscopy 07/2021.

## 2016-04-19 NOTE — Telephone Encounter (Signed)
Pt aware and recall in EPIC

## 2016-04-22 ENCOUNTER — Ambulatory Visit (HOSPITAL_COMMUNITY)
Admission: RE | Admit: 2016-04-22 | Discharge: 2016-04-22 | Disposition: A | Discharge status: Home / Self Care | Payer: 59 | Source: Ambulatory Visit | Attending: Gastroenterology | Admitting: Gastroenterology

## 2016-04-22 DIAGNOSIS — R932 Abnormal findings on diagnostic imaging of liver and biliary tract: Secondary | ICD-10-CM | POA: Insufficient documentation

## 2016-04-22 DIAGNOSIS — R1013 Epigastric pain: Secondary | ICD-10-CM | POA: Insufficient documentation

## 2016-05-18 DIAGNOSIS — J029 Acute pharyngitis, unspecified: Secondary | ICD-10-CM | POA: Diagnosis not present

## 2016-05-29 DIAGNOSIS — N951 Menopausal and female climacteric states: Secondary | ICD-10-CM | POA: Diagnosis not present

## 2016-05-29 DIAGNOSIS — R635 Abnormal weight gain: Secondary | ICD-10-CM | POA: Diagnosis not present

## 2016-05-31 ENCOUNTER — Ambulatory Visit (AMBULATORY_SURGERY_CENTER): Payer: Self-pay | Admitting: *Deleted

## 2016-05-31 VITALS — Ht 60.0 in | Wt 147.6 lb

## 2016-05-31 DIAGNOSIS — R1084 Generalized abdominal pain: Secondary | ICD-10-CM

## 2016-06-10 DIAGNOSIS — Z6830 Body mass index (BMI) 30.0-30.9, adult: Secondary | ICD-10-CM | POA: Diagnosis not present

## 2016-06-10 DIAGNOSIS — R7301 Impaired fasting glucose: Secondary | ICD-10-CM | POA: Diagnosis not present

## 2016-06-10 DIAGNOSIS — E538 Deficiency of other specified B group vitamins: Secondary | ICD-10-CM | POA: Diagnosis not present

## 2016-06-14 ENCOUNTER — Ambulatory Visit (AMBULATORY_SURGERY_CENTER): Payer: Medicare Other | Admitting: Gastroenterology

## 2016-06-14 ENCOUNTER — Encounter: Payer: Self-pay | Admitting: Gastroenterology

## 2016-06-14 VITALS — BP 137/60 | HR 76 | Temp 98.4°F | Resp 19 | Ht 60.0 in | Wt 147.0 lb

## 2016-06-14 DIAGNOSIS — K2970 Gastritis, unspecified, without bleeding: Secondary | ICD-10-CM | POA: Diagnosis not present

## 2016-06-14 DIAGNOSIS — R1084 Generalized abdominal pain: Secondary | ICD-10-CM | POA: Diagnosis not present

## 2016-06-14 DIAGNOSIS — K2990 Gastroduodenitis, unspecified, without bleeding: Secondary | ICD-10-CM

## 2016-06-14 DIAGNOSIS — K2950 Unspecified chronic gastritis without bleeding: Secondary | ICD-10-CM | POA: Diagnosis not present

## 2016-06-14 DIAGNOSIS — R109 Unspecified abdominal pain: Secondary | ICD-10-CM | POA: Diagnosis not present

## 2016-06-14 MED ORDER — SODIUM CHLORIDE 0.9 % IV SOLN: 500.0000 mL | INTRAVENOUS | Status: DC

## 2016-06-14 NOTE — Patient Instructions (Signed)
Gastritis biopsies taken today. Handout given on gastritis. Result letter in your mail in 2-3 weeks. Resume current medications. Call us with any questions or concerns. Thank you!   YOU HAD AN ENDOSCOPIC PROCEDURE TODAY AT [facility]:   Refer to the procedure report that was given to you for any specific questions about what was found during the examination.  If the procedure report does not answer your questions, please call your gastroenterologist to clarify.  If you requested that your care partner not be given the details of your procedure findings, then the procedure report has been included in a sealed envelope for you to review at your convenience later.  YOU SHOULD EXPECT: Some feelings of bloating in the abdomen. Passage of more gas than usual.  Walking can help get rid of the air that was put into your GI tract during the procedure and reduce the bloating. If you had a lower endoscopy (such as a colonoscopy or flexible sigmoidoscopy) you may notice spotting of blood in your stool or on the toilet paper. If you underwent a bowel prep for your procedure, you may not have a normal bowel movement for a few days.  Please Note:  You might notice some irritation and congestion in your nose or some drainage.  This is from the oxygen used during your procedure.  There is no need for concern and it should clear up in a day or so.  SYMPTOMS TO REPORT IMMEDIATELY:    Following upper endoscopy (EGD)  Vomiting of blood or coffee ground material  New chest pain or pain under the shoulder blades  Painful or persistently difficult swallowing  New shortness of breath  Fever of 100F or higher  Black, tarry-looking stools  For urgent or emergent issues, a gastroenterologist can be reached at any hour by calling [phone].   DIET:  We do recommend a small meal at first, but then you may proceed to your regular diet.  Drink plenty of fluids but you should avoid alcoholic  beverages for 24 hours.  ACTIVITY:  You should plan to take it easy for the rest of today and you should NOT DRIVE or use heavy machinery until tomorrow (because of the sedation medicines used during the test).    FOLLOW UP: Our staff will call the number listed on your records the next business day following your procedure to check on you and address any questions or concerns that you may have regarding the information given to you following your procedure. If we do not reach you, we will leave a message.  However, if you are feeling well and you are not experiencing any problems, there is no need to return our call.  We will assume that you have returned to your regular daily activities without incident.  If any biopsies were taken you will be contacted by phone or by letter within the next 1-3 weeks.  Please call us at [phone] if you have not heard about the biopsies in 3 weeks.    SIGNATURES/CONFIDENTIALITY: You and/or your care partner have signed paperwork which will be entered into your electronic medical record.  These signatures attest to the fact that that the information above on your After Visit Summary has been reviewed and is understood.  Full responsibility of the confidentiality of this discharge information lies with you and/or your care-partner.

## 2016-06-14 NOTE — Progress Notes (Signed)
Dental advisory given to patient 

## 2016-06-14 NOTE — Progress Notes (Signed)
A/ox3 pleased with MAC, report to Robbin RN 

## 2016-06-14 NOTE — Progress Notes (Signed)
Called to room to assist during endoscopic procedure.  Patient ID and intended procedure confirmed with present staff. Received instructions for my participation in the procedure from the performing physician.  

## 2016-06-14 NOTE — Op Note (Signed)
[facility] Patient Name: Rita Levine Procedure Date: 06/14/2016 10:06 AM MRN: [id number] Endoscopist: Milus Banister , MD Age: 65 Referring MD:  Date of Birth: [birth year] Gender: Female Account #: [id number] Procedure:                Upper GI endoscopy Indications:              Epigastric abdominal pain, Abdominal pain in the                            right upper quadrant, Diarrhea, Nausea Medicines:                Monitored Anesthesia Care Procedure:                Pre-Anesthesia Assessment:                           - Prior to the procedure, a History and Physical                            was performed, and patient medications and                            allergies were reviewed. The patient's tolerance of                            previous anesthesia was also reviewed. The risks                            and benefits of the procedure and the sedation                            options and risks were discussed with the patient.                            All questions were answered, and informed consent                            was obtained. Prior Anticoagulants: The patient has                            taken no previous anticoagulant or antiplatelet                            agents. ASA Grade Assessment: II - A patient with                            mild systemic disease. After reviewing the risks                            and benefits, the patient was deemed in                            satisfactory condition to undergo the procedure.  After obtaining informed consent, the endoscope was                            passed under direct vision. Throughout the                            procedure, the patient's blood pressure, pulse, and                            oxygen saturations were monitored continuously. The                            Model GIF-HQ190 [id number]) scope was introduced                            through the mouth,  and advanced to the second part                            of duodenum. The upper GI endoscopy was                            accomplished without difficulty. The patient                            tolerated the procedure well. Scope In: Scope Out: Findings:                 Diffuse mild inflammation characterized by erythema                            and friability was found in the gastric antrum.                            Biopsies were taken with a cold forceps for                            histology.                           The exam was otherwise without abnormality. Complications:            No immediate complications. Estimated blood loss:                            None. Estimated Blood Loss:     Estimated blood loss: none. Impression:               - Gastritis. Biopsied.                           - The examination was otherwise normal. Recommendation:           - Patient has a contact number available for                            emergencies. The signs and symptoms of potential  delayed complications were discussed with the                            patient. Return to normal activities tomorrow.                            Written discharge instructions were provided to the                            patient.                           - Resume previous diet.                           - Continue present medications.                           - Await pathology results. Milus Banister, MD 06/14/2016 10:33:06 AM This report has been signed electronically.

## 2016-06-18 ENCOUNTER — Other Ambulatory Visit: Payer: Self-pay

## 2016-06-18 ENCOUNTER — Telehealth: Payer: Self-pay

## 2016-06-18 ENCOUNTER — Telehealth: Payer: Self-pay | Admitting: Gastroenterology

## 2016-06-18 DIAGNOSIS — K625 Hemorrhage of anus and rectum: Secondary | ICD-10-CM

## 2016-06-18 NOTE — Telephone Encounter (Signed)
Left message on answering machine. 

## 2016-06-18 NOTE — Telephone Encounter (Signed)
Called pt back, pt states she has had maroon stool since Sunday 06/16/16. Pt had EGD with Dr Ardis Hughs on Friday 06/14/16, advised pt we would contact Dr Ardis Hughs. Dr Ardis Hughs advised for pt to have labs drawn today or tomorrow (cbc and bmp) but it pt starts to see a lot of bright red blood to go to ER. Called pt back and advised of Dr Ardis Hughs recommendations, pt verbalized understanding. Patty was called to place lab orders for cbc, bmp.-cm

## 2016-06-19 ENCOUNTER — Other Ambulatory Visit (INDEPENDENT_AMBULATORY_CARE_PROVIDER_SITE_OTHER): Payer: Medicare Other

## 2016-06-19 DIAGNOSIS — K625 Hemorrhage of anus and rectum: Secondary | ICD-10-CM | POA: Diagnosis not present

## 2016-06-19 LAB — CBC WITH DIFFERENTIAL/PLATELET
BASOS ABS: 0 10*3/uL (ref 0.0–0.1)
BASOS PCT: 0.6 % (ref 0.0–3.0)
EOS PCT: 1.9 % (ref 0.0–5.0)
Eosinophils Absolute: 0.1 10*3/uL (ref 0.0–0.7)
HEMATOCRIT: 34.3 % — AB (ref 36.0–46.0)
Hemoglobin: 11.6 g/dL — ABNORMAL LOW (ref 12.0–15.0)
LYMPHS ABS: 1.9 10*3/uL (ref 0.7–4.0)
LYMPHS PCT: 31.8 % (ref 12.0–46.0)
MCHC: 33.9 g/dL (ref 30.0–36.0)
MCV: 82 fl (ref 78.0–100.0)
MONOS PCT: 10.8 % (ref 3.0–12.0)
Monocytes Absolute: 0.6 10*3/uL (ref 0.1–1.0)
NEUTROS ABS: 3.2 10*3/uL (ref 1.4–7.7)
NEUTROS PCT: 54.9 % (ref 43.0–77.0)
PLATELETS: 242 10*3/uL (ref 150.0–400.0)
RBC: 4.18 Mil/uL (ref 3.87–5.11)
RDW: 14.4 % (ref 11.5–15.5)
WBC: 5.9 10*3/uL (ref 4.0–10.5)

## 2016-06-19 LAB — BASIC METABOLIC PANEL
BUN: 9 mg/dL (ref 6–23)
CALCIUM: 9.1 mg/dL (ref 8.4–10.5)
CHLORIDE: 106 meq/L (ref 96–112)
CO2: 27 mEq/L (ref 19–32)
CREATININE: 0.71 mg/dL (ref 0.40–1.20)
GFR: 87.79 mL/min (ref >60.00)
Glucose, Bld: 105 mg/dL — ABNORMAL HIGH (ref 70–99)
Potassium: 4.7 mEq/L (ref 3.5–5.1)
Sodium: 139 mEq/L (ref 135–145)

## 2016-06-21 ENCOUNTER — Other Ambulatory Visit: Payer: Self-pay

## 2016-06-21 DIAGNOSIS — D649 Anemia, unspecified: Secondary | ICD-10-CM

## 2016-06-26 ENCOUNTER — Other Ambulatory Visit (INDEPENDENT_AMBULATORY_CARE_PROVIDER_SITE_OTHER): Payer: Medicare Other

## 2016-06-26 DIAGNOSIS — D649 Anemia, unspecified: Secondary | ICD-10-CM | POA: Diagnosis not present

## 2016-06-26 LAB — CBC WITH DIFFERENTIAL/PLATELET
BASOS PCT: 0.3 % (ref 0.0–3.0)
Basophils Absolute: 0 10*3/uL (ref 0.0–0.1)
EOS ABS: 0.1 10*3/uL (ref 0.0–0.7)
Eosinophils Relative: 1.5 % (ref 0.0–5.0)
HEMATOCRIT: 33.4 % — AB (ref 36.0–46.0)
HEMOGLOBIN: 11.4 g/dL — AB (ref 12.0–15.0)
LYMPHS PCT: 24.8 % (ref 12.0–46.0)
Lymphs Abs: 1.8 10*3/uL (ref 0.7–4.0)
MCHC: 34.1 g/dL (ref 30.0–36.0)
MCV: 81.2 fl (ref 78.0–100.0)
Monocytes Absolute: 0.6 10*3/uL (ref 0.1–1.0)
Monocytes Relative: 7.9 % (ref 3.0–12.0)
Neutro Abs: 4.7 10*3/uL (ref 1.4–7.7)
Neutrophils Relative %: 65.5 % (ref 43.0–77.0)
Platelets: 289 10*3/uL (ref 150.0–400.0)
RBC: 4.11 Mil/uL (ref 3.87–5.11)
RDW: 14.3 % (ref 11.5–15.5)
WBC: 7.2 10*3/uL (ref 4.0–10.5)

## 2016-06-28 ENCOUNTER — Telehealth: Payer: Self-pay | Admitting: Gastroenterology

## 2016-06-28 DIAGNOSIS — E538 Deficiency of other specified B group vitamins: Secondary | ICD-10-CM | POA: Diagnosis not present

## 2016-06-28 NOTE — Telephone Encounter (Signed)
See result note sent to Dr Ardis Hughs

## 2016-07-03 DIAGNOSIS — Z124 Encounter for screening for malignant neoplasm of cervix: Secondary | ICD-10-CM | POA: Diagnosis not present

## 2016-07-03 DIAGNOSIS — M79642 Pain in left hand: Secondary | ICD-10-CM | POA: Diagnosis not present

## 2016-07-03 DIAGNOSIS — Z23 Encounter for immunization: Secondary | ICD-10-CM | POA: Diagnosis not present

## 2016-07-03 DIAGNOSIS — M6530 Trigger finger, unspecified finger: Secondary | ICD-10-CM | POA: Diagnosis not present

## 2016-07-30 ENCOUNTER — Encounter: Payer: Medicare Other | Admitting: Gastroenterology

## 2016-08-01 ENCOUNTER — Ambulatory Visit (AMBULATORY_SURGERY_CENTER): Payer: Self-pay | Admitting: *Deleted

## 2016-08-01 VITALS — Ht 60.0 in | Wt 150.0 lb

## 2016-08-01 DIAGNOSIS — K625 Hemorrhage of anus and rectum: Secondary | ICD-10-CM

## 2016-08-01 MED ORDER — NA SULFATE-K SULFATE-MG SULF 17.5-3.13-1.6 GM/177ML PO SOLN: 1.0000 | Freq: Once | ORAL | 0 refills | Status: AC

## 2016-08-01 NOTE — Progress Notes (Signed)
No egg or soy allergy. No anesthesia problems.  No home O2.  No diet meds.  

## 2016-08-05 ENCOUNTER — Encounter: Payer: Self-pay | Admitting: Gastroenterology

## 2016-08-05 ENCOUNTER — Ambulatory Visit (AMBULATORY_SURGERY_CENTER): Payer: Medicare Other | Admitting: Gastroenterology

## 2016-08-05 VITALS — BP 129/69 | HR 66 | Temp 98.0°F | Resp 12 | Ht 60.0 in | Wt 147.0 lb

## 2016-08-05 DIAGNOSIS — J45909 Unspecified asthma, uncomplicated: Secondary | ICD-10-CM | POA: Diagnosis not present

## 2016-08-05 DIAGNOSIS — K625 Hemorrhage of anus and rectum: Secondary | ICD-10-CM | POA: Diagnosis not present

## 2016-08-05 DIAGNOSIS — K5730 Diverticulosis of large intestine without perforation or abscess without bleeding: Secondary | ICD-10-CM | POA: Diagnosis not present

## 2016-08-05 DIAGNOSIS — I272 Pulmonary hypertension, unspecified: Secondary | ICD-10-CM | POA: Diagnosis not present

## 2016-08-05 DIAGNOSIS — E039 Hypothyroidism, unspecified: Secondary | ICD-10-CM | POA: Diagnosis not present

## 2016-08-05 DIAGNOSIS — K649 Unspecified hemorrhoids: Secondary | ICD-10-CM | POA: Diagnosis not present

## 2016-08-05 MED ORDER — SODIUM CHLORIDE 0.9 % IV SOLN: 500.0000 mL | INTRAVENOUS | Status: DC

## 2016-08-05 NOTE — Progress Notes (Signed)
To recovery, report to RN, VSS. 

## 2016-08-05 NOTE — Patient Instructions (Signed)
YOU HAD AN ENDOSCOPIC PROCEDURE TODAY AT [facility]:   Refer to the procedure report that was given to you for any specific questions about what was found during the examination.  If the procedure report does not answer your questions, please call your gastroenterologist to clarify.  If you requested that your care partner not be given the details of your procedure findings, then the procedure report has been included in a sealed envelope for you to review at your convenience later.  YOU SHOULD EXPECT: Some feelings of bloating in the abdomen. Passage of more gas than usual.  Walking can help get rid of the air that was put into your GI tract during the procedure and reduce the bloating. If you had a lower endoscopy (such as a colonoscopy or flexible sigmoidoscopy) you may notice spotting of blood in your stool or on the toilet paper. If you underwent a bowel prep for your procedure, you may not have a normal bowel movement for a few days.  Please Note:  You might notice some irritation and congestion in your nose or some drainage.  This is from the oxygen used during your procedure.  There is no need for concern and it should clear up in a day or so.  SYMPTOMS TO REPORT IMMEDIATELY:   Following lower endoscopy (colonoscopy or flexible sigmoidoscopy):  Excessive amounts of blood in the stool  Significant tenderness or worsening of abdominal pains  Swelling of the abdomen that is new, acute  Fever of 100F or higher   For urgent or emergent issues, a gastroenterologist can be reached at any hour by calling [phone]. Please read all your handouts given to you by your recovery Rn today.   DIET:  We do recommend a small meal at first, but then you may proceed to your regular diet.  Drink plenty of fluids but you should avoid alcoholic beverages for 24 hours.  ACTIVITY:  You should plan to take it easy for the rest of today and you should NOT DRIVE or use heavy machinery  until tomorrow (because of the sedation medicines used during the test).    FOLLOW UP: Our staff will call the number listed on your records the next business day following your procedure to check on you and address any questions or concerns that you may have regarding the information given to you following your procedure. If we do not reach you, we will leave a message.  However, if you are feeling well and you are not experiencing any problems, there is no need to return our call.  We will assume that you have returned to your regular daily activities without incident.  If any biopsies were taken you will be contacted by phone or by letter within the next 1-3 weeks.  Please call us at [phone] if you have not heard about the biopsies in 3 weeks.    SIGNATURES/CONFIDENTIALITY: You and/or your care partner have signed paperwork which will be entered into your electronic medical record.  These signatures attest to the fact that that the information above on your After Visit Summary has been reviewed and is understood.  Full responsibility of the confidentiality of this discharge information lies with you and/or your care-partner.  Thank you for letting us take care of your healthcare needs today.

## 2016-08-05 NOTE — Op Note (Signed)
[place] Patient Name: Rita Levine Procedure Date: 08/05/2016 7:49 AM MRN: [id number] Endoscopist: Milus Banister , MD Age: 65 Referring MD:  Date of Birth: Nov 23, 1950 Gender: Female Account #: [id number] Procedure:                Colonoscopy Indications:              New Hematochezia; Colonoscopy Dr. Lavina Hamman 07/2011 at                            [facility]) done for "screening... Last                            colonoscopy: 1993." Findings diverticulosis                            throughout the colon, internal hemorrhoids, no                            polyps. He recommended repeat colonoscopy in 10                            years Medicines:                Monitored Anesthesia Care Procedure:                Pre-Anesthesia Assessment:                           - Prior to the procedure, a History and Physical                            was performed, and patient medications and                            allergies were reviewed. The patient's tolerance of                            previous anesthesia was also reviewed. The risks                            and benefits of the procedure and the sedation                            options and risks were discussed with the patient.                            All questions were answered, and informed consent                            was obtained. Prior Anticoagulants: The patient has                            taken no previous anticoagulant or antiplatelet  agents. ASA Grade Assessment: II - A patient with                            mild systemic disease. After reviewing the risks                            and benefits, the patient was deemed in                            satisfactory condition to undergo the procedure.                           After obtaining informed consent, the colonoscope                            was passed under direct vision. Throughout the                             procedure, the patient's blood pressure, pulse, and                            oxygen saturations were monitored continuously. The                            Model CF-HQ190L [id number]) scope was introduced                            through the anus and advanced to the the cecum,                            identified by appendiceal orifice and ileocecal                            valve. The colonoscopy was performed without                            difficulty. The patient tolerated the procedure                            well. The quality of the bowel preparation was                            good. The ileocecal valve, appendiceal orifice, and                            rectum were photographed. Scope In: 7:53:01 AM Scope Out: 7:59:04 AM Scope Withdrawal Time: 0 hours 4 minutes 40 seconds  Total Procedure Duration: 0 hours 6 minutes 3 seconds  Findings:                 Many small and large-mouthed diverticula were found                            in the entire colon.  External and internal hemorrhoids were found. The                            hemorrhoids were small.                           The exam was otherwise without abnormality on                            direct and retroflexion views. Complications:            No immediate complications. Estimated blood loss:                            None. Estimated Blood Loss:     Estimated blood loss: none. Impression:               - Diverticulosis in the entire examined colon.                           - External and internal hemorrhoids. These are                            small, likely the source of your recent rectal                            bleeding.                           - The examination was otherwise normal on direct                            and retroflexion views.                           - No polyps or cancers Recommendation:           - Patient has a contact number available for                             emergencies. The signs and symptoms of potential                            delayed complications were discussed with the                            patient. Return to normal activities tomorrow.                            Written discharge instructions were provided to the                            patient.                           - Resume previous diet.                           -  Continue present medications.                           - Repeat colonoscopy in 10 years for screening                            purposes. Milus Banister, MD 08/05/2016 8:03:08 AM This report has been signed electronically.

## 2016-08-06 ENCOUNTER — Telehealth: Payer: Self-pay

## 2016-08-06 NOTE — Telephone Encounter (Signed)
  Follow up Call-  Call back number 08/05/2016 06/14/2016  Post procedure Call Back phone  # [phone]  Permission to leave phone message Yes Yes  Some recent data might be hidden     Patient questions:  Do you have a fever, pain , or abdominal swelling? No. Pain Score  0 *  Have you tolerated food without any problems? Yes.    Have you been able to return to your normal activities? Yes.    Do you have any questions about your discharge instructions: Diet   No. Medications  No. Follow up visit  No.  Do you have questions or concerns about your Care? No.  Actions: * If pain score is 4 or above: No action needed, pain <4.

## 2016-08-30 DIAGNOSIS — E538 Deficiency of other specified B group vitamins: Secondary | ICD-10-CM | POA: Diagnosis not present

## 2016-09-03 DIAGNOSIS — E039 Hypothyroidism, unspecified: Secondary | ICD-10-CM | POA: Diagnosis not present

## 2016-09-03 DIAGNOSIS — M797 Fibromyalgia: Secondary | ICD-10-CM | POA: Diagnosis not present

## 2016-09-03 DIAGNOSIS — J069 Acute upper respiratory infection, unspecified: Secondary | ICD-10-CM | POA: Diagnosis not present

## 2016-09-03 DIAGNOSIS — R7301 Impaired fasting glucose: Secondary | ICD-10-CM | POA: Diagnosis not present

## 2016-09-03 DIAGNOSIS — M792 Neuralgia and neuritis, unspecified: Secondary | ICD-10-CM | POA: Diagnosis not present

## 2016-09-03 DIAGNOSIS — N958 Other specified menopausal and perimenopausal disorders: Secondary | ICD-10-CM | POA: Diagnosis not present

## 2016-09-12 DIAGNOSIS — E538 Deficiency of other specified B group vitamins: Secondary | ICD-10-CM | POA: Diagnosis not present

## 2016-09-18 DIAGNOSIS — E538 Deficiency of other specified B group vitamins: Secondary | ICD-10-CM | POA: Diagnosis not present

## 2016-09-25 DIAGNOSIS — M65331 Trigger finger, right middle finger: Secondary | ICD-10-CM | POA: Diagnosis not present

## 2016-09-25 DIAGNOSIS — E538 Deficiency of other specified B group vitamins: Secondary | ICD-10-CM | POA: Diagnosis not present

## 2016-10-02 DIAGNOSIS — E538 Deficiency of other specified B group vitamins: Secondary | ICD-10-CM | POA: Diagnosis not present

## 2016-10-02 DIAGNOSIS — N958 Other specified menopausal and perimenopausal disorders: Secondary | ICD-10-CM | POA: Diagnosis not present

## 2016-10-08 DIAGNOSIS — N951 Menopausal and female climacteric states: Secondary | ICD-10-CM | POA: Diagnosis not present

## 2016-10-08 DIAGNOSIS — E039 Hypothyroidism, unspecified: Secondary | ICD-10-CM | POA: Diagnosis not present

## 2016-10-08 DIAGNOSIS — N958 Other specified menopausal and perimenopausal disorders: Secondary | ICD-10-CM | POA: Diagnosis not present

## 2016-10-08 DIAGNOSIS — E538 Deficiency of other specified B group vitamins: Secondary | ICD-10-CM | POA: Diagnosis not present

## 2016-10-16 DIAGNOSIS — R05 Cough: Secondary | ICD-10-CM | POA: Diagnosis not present

## 2016-10-16 DIAGNOSIS — E538 Deficiency of other specified B group vitamins: Secondary | ICD-10-CM | POA: Diagnosis not present

## 2016-10-24 DIAGNOSIS — M797 Fibromyalgia: Secondary | ICD-10-CM | POA: Diagnosis not present

## 2016-10-24 DIAGNOSIS — J4520 Mild intermittent asthma, uncomplicated: Secondary | ICD-10-CM | POA: Diagnosis not present

## 2016-10-24 DIAGNOSIS — R062 Wheezing: Secondary | ICD-10-CM | POA: Diagnosis not present

## 2016-10-24 DIAGNOSIS — R05 Cough: Secondary | ICD-10-CM | POA: Diagnosis not present

## 2016-10-24 DIAGNOSIS — J208 Acute bronchitis due to other specified organisms: Secondary | ICD-10-CM | POA: Diagnosis not present

## 2016-11-05 DIAGNOSIS — E538 Deficiency of other specified B group vitamins: Secondary | ICD-10-CM | POA: Diagnosis not present

## 2016-11-11 DIAGNOSIS — H1045 Other chronic allergic conjunctivitis: Secondary | ICD-10-CM | POA: Diagnosis not present

## 2016-11-11 DIAGNOSIS — R05 Cough: Secondary | ICD-10-CM | POA: Diagnosis not present

## 2016-11-11 DIAGNOSIS — J309 Allergic rhinitis, unspecified: Secondary | ICD-10-CM | POA: Diagnosis not present

## 2016-11-11 DIAGNOSIS — K219 Gastro-esophageal reflux disease without esophagitis: Secondary | ICD-10-CM | POA: Diagnosis not present

## 2016-11-13 DIAGNOSIS — L570 Actinic keratosis: Secondary | ICD-10-CM | POA: Diagnosis not present

## 2016-11-13 DIAGNOSIS — Z809 Family history of malignant neoplasm, unspecified: Secondary | ICD-10-CM | POA: Diagnosis not present

## 2016-11-13 DIAGNOSIS — D2239 Melanocytic nevi of other parts of face: Secondary | ICD-10-CM | POA: Diagnosis not present

## 2016-11-13 DIAGNOSIS — Z1283 Encounter for screening for malignant neoplasm of skin: Secondary | ICD-10-CM | POA: Diagnosis not present

## 2016-11-25 DIAGNOSIS — E538 Deficiency of other specified B group vitamins: Secondary | ICD-10-CM | POA: Diagnosis not present

## 2016-12-04 DIAGNOSIS — E538 Deficiency of other specified B group vitamins: Secondary | ICD-10-CM | POA: Diagnosis not present

## 2016-12-10 DIAGNOSIS — N958 Other specified menopausal and perimenopausal disorders: Secondary | ICD-10-CM | POA: Diagnosis not present

## 2016-12-10 DIAGNOSIS — L680 Hirsutism: Secondary | ICD-10-CM | POA: Diagnosis not present

## 2016-12-10 DIAGNOSIS — E039 Hypothyroidism, unspecified: Secondary | ICD-10-CM | POA: Diagnosis not present

## 2016-12-11 DIAGNOSIS — L680 Hirsutism: Secondary | ICD-10-CM | POA: Diagnosis not present

## 2016-12-11 DIAGNOSIS — E039 Hypothyroidism, unspecified: Secondary | ICD-10-CM | POA: Diagnosis not present

## 2016-12-11 DIAGNOSIS — N958 Other specified menopausal and perimenopausal disorders: Secondary | ICD-10-CM | POA: Diagnosis not present

## 2016-12-11 DIAGNOSIS — E538 Deficiency of other specified B group vitamins: Secondary | ICD-10-CM | POA: Diagnosis not present

## 2016-12-18 DIAGNOSIS — E538 Deficiency of other specified B group vitamins: Secondary | ICD-10-CM | POA: Diagnosis not present

## 2016-12-18 DIAGNOSIS — Z6830 Body mass index (BMI) 30.0-30.9, adult: Secondary | ICD-10-CM | POA: Diagnosis not present

## 2016-12-18 DIAGNOSIS — R7301 Impaired fasting glucose: Secondary | ICD-10-CM | POA: Diagnosis not present

## 2016-12-25 DIAGNOSIS — E039 Hypothyroidism, unspecified: Secondary | ICD-10-CM | POA: Diagnosis not present

## 2016-12-25 DIAGNOSIS — R7301 Impaired fasting glucose: Secondary | ICD-10-CM | POA: Diagnosis not present

## 2017-01-02 ENCOUNTER — Other Ambulatory Visit: Payer: Self-pay | Admitting: Family Medicine

## 2017-01-02 DIAGNOSIS — Z1231 Encounter for screening mammogram for malignant neoplasm of breast: Secondary | ICD-10-CM

## 2017-01-03 DIAGNOSIS — E538 Deficiency of other specified B group vitamins: Secondary | ICD-10-CM | POA: Diagnosis not present

## 2017-01-03 DIAGNOSIS — R7301 Impaired fasting glucose: Secondary | ICD-10-CM | POA: Diagnosis not present

## 2017-01-03 DIAGNOSIS — Z6828 Body mass index (BMI) 28.0-28.9, adult: Secondary | ICD-10-CM | POA: Diagnosis not present

## 2017-01-03 DIAGNOSIS — E039 Hypothyroidism, unspecified: Secondary | ICD-10-CM | POA: Diagnosis not present

## 2017-01-09 DIAGNOSIS — Z6828 Body mass index (BMI) 28.0-28.9, adult: Secondary | ICD-10-CM | POA: Diagnosis not present

## 2017-01-09 DIAGNOSIS — R7301 Impaired fasting glucose: Secondary | ICD-10-CM | POA: Diagnosis not present

## 2017-01-09 DIAGNOSIS — E039 Hypothyroidism, unspecified: Secondary | ICD-10-CM | POA: Diagnosis not present

## 2017-01-09 DIAGNOSIS — E538 Deficiency of other specified B group vitamins: Secondary | ICD-10-CM | POA: Diagnosis not present

## 2017-01-16 DIAGNOSIS — Z6828 Body mass index (BMI) 28.0-28.9, adult: Secondary | ICD-10-CM | POA: Diagnosis not present

## 2017-01-16 DIAGNOSIS — E538 Deficiency of other specified B group vitamins: Secondary | ICD-10-CM | POA: Diagnosis not present

## 2017-01-16 DIAGNOSIS — R7301 Impaired fasting glucose: Secondary | ICD-10-CM | POA: Diagnosis not present

## 2017-01-23 DIAGNOSIS — Z6828 Body mass index (BMI) 28.0-28.9, adult: Secondary | ICD-10-CM | POA: Diagnosis not present

## 2017-01-23 DIAGNOSIS — R7301 Impaired fasting glucose: Secondary | ICD-10-CM | POA: Diagnosis not present

## 2017-01-23 DIAGNOSIS — E039 Hypothyroidism, unspecified: Secondary | ICD-10-CM | POA: Diagnosis not present

## 2017-01-23 DIAGNOSIS — E538 Deficiency of other specified B group vitamins: Secondary | ICD-10-CM | POA: Diagnosis not present

## 2017-01-29 DIAGNOSIS — E538 Deficiency of other specified B group vitamins: Secondary | ICD-10-CM | POA: Diagnosis not present

## 2017-01-29 DIAGNOSIS — E039 Hypothyroidism, unspecified: Secondary | ICD-10-CM | POA: Diagnosis not present

## 2017-01-29 DIAGNOSIS — Z6827 Body mass index (BMI) 27.0-27.9, adult: Secondary | ICD-10-CM | POA: Diagnosis not present

## 2017-01-29 DIAGNOSIS — R7301 Impaired fasting glucose: Secondary | ICD-10-CM | POA: Diagnosis not present

## 2017-02-05 DIAGNOSIS — Z6827 Body mass index (BMI) 27.0-27.9, adult: Secondary | ICD-10-CM | POA: Diagnosis not present

## 2017-02-05 DIAGNOSIS — E538 Deficiency of other specified B group vitamins: Secondary | ICD-10-CM | POA: Diagnosis not present

## 2017-02-05 DIAGNOSIS — R7301 Impaired fasting glucose: Secondary | ICD-10-CM | POA: Diagnosis not present

## 2017-02-05 DIAGNOSIS — E039 Hypothyroidism, unspecified: Secondary | ICD-10-CM | POA: Diagnosis not present

## 2017-02-12 DIAGNOSIS — Z6827 Body mass index (BMI) 27.0-27.9, adult: Secondary | ICD-10-CM | POA: Diagnosis not present

## 2017-02-12 DIAGNOSIS — E039 Hypothyroidism, unspecified: Secondary | ICD-10-CM | POA: Diagnosis not present

## 2017-02-12 DIAGNOSIS — E538 Deficiency of other specified B group vitamins: Secondary | ICD-10-CM | POA: Diagnosis not present

## 2017-02-12 DIAGNOSIS — R7301 Impaired fasting glucose: Secondary | ICD-10-CM | POA: Diagnosis not present

## 2017-02-17 ENCOUNTER — Ambulatory Visit
Admission: RE | Admit: 2017-02-17 | Discharge: 2017-02-17 | Disposition: A | Discharge status: Home / Self Care | Payer: Medicare Other | Source: Ambulatory Visit | Attending: Family Medicine | Admitting: Family Medicine

## 2017-02-17 DIAGNOSIS — Z1231 Encounter for screening mammogram for malignant neoplasm of breast: Secondary | ICD-10-CM

## 2017-02-19 DIAGNOSIS — Z6826 Body mass index (BMI) 26.0-26.9, adult: Secondary | ICD-10-CM | POA: Diagnosis not present

## 2017-02-19 DIAGNOSIS — R7301 Impaired fasting glucose: Secondary | ICD-10-CM | POA: Diagnosis not present

## 2017-02-19 DIAGNOSIS — E039 Hypothyroidism, unspecified: Secondary | ICD-10-CM | POA: Diagnosis not present

## 2017-02-19 DIAGNOSIS — E538 Deficiency of other specified B group vitamins: Secondary | ICD-10-CM | POA: Diagnosis not present

## 2017-02-27 DIAGNOSIS — R7301 Impaired fasting glucose: Secondary | ICD-10-CM | POA: Diagnosis not present

## 2017-02-27 DIAGNOSIS — N958 Other specified menopausal and perimenopausal disorders: Secondary | ICD-10-CM | POA: Diagnosis not present

## 2017-02-27 DIAGNOSIS — R635 Abnormal weight gain: Secondary | ICD-10-CM | POA: Diagnosis not present

## 2017-02-27 DIAGNOSIS — E039 Hypothyroidism, unspecified: Secondary | ICD-10-CM | POA: Diagnosis not present

## 2017-02-27 DIAGNOSIS — Z6826 Body mass index (BMI) 26.0-26.9, adult: Secondary | ICD-10-CM | POA: Diagnosis not present

## 2017-03-06 DIAGNOSIS — Z6826 Body mass index (BMI) 26.0-26.9, adult: Secondary | ICD-10-CM | POA: Diagnosis not present

## 2017-03-06 DIAGNOSIS — E039 Hypothyroidism, unspecified: Secondary | ICD-10-CM | POA: Diagnosis not present

## 2017-03-06 DIAGNOSIS — E538 Deficiency of other specified B group vitamins: Secondary | ICD-10-CM | POA: Diagnosis not present

## 2017-03-06 DIAGNOSIS — R7301 Impaired fasting glucose: Secondary | ICD-10-CM | POA: Diagnosis not present

## 2017-03-13 DIAGNOSIS — R7301 Impaired fasting glucose: Secondary | ICD-10-CM | POA: Diagnosis not present

## 2017-03-13 DIAGNOSIS — E039 Hypothyroidism, unspecified: Secondary | ICD-10-CM | POA: Diagnosis not present

## 2017-03-13 DIAGNOSIS — E538 Deficiency of other specified B group vitamins: Secondary | ICD-10-CM | POA: Diagnosis not present

## 2017-04-03 DIAGNOSIS — M79645 Pain in left finger(s): Secondary | ICD-10-CM | POA: Diagnosis not present

## 2017-04-03 DIAGNOSIS — M1812 Unilateral primary osteoarthritis of first carpometacarpal joint, left hand: Secondary | ICD-10-CM | POA: Diagnosis not present

## 2017-04-08 DIAGNOSIS — Z780 Asymptomatic menopausal state: Secondary | ICD-10-CM | POA: Diagnosis not present

## 2017-04-08 DIAGNOSIS — I10 Essential (primary) hypertension: Secondary | ICD-10-CM | POA: Diagnosis not present

## 2017-04-08 DIAGNOSIS — E782 Mixed hyperlipidemia: Secondary | ICD-10-CM | POA: Diagnosis not present

## 2017-04-08 DIAGNOSIS — N898 Other specified noninflammatory disorders of vagina: Secondary | ICD-10-CM | POA: Diagnosis not present

## 2017-04-08 DIAGNOSIS — E559 Vitamin D deficiency, unspecified: Secondary | ICD-10-CM | POA: Diagnosis not present

## 2017-04-08 DIAGNOSIS — Z0000 Encounter for general adult medical examination without abnormal findings: Secondary | ICD-10-CM | POA: Diagnosis not present

## 2017-04-08 DIAGNOSIS — M8580 Other specified disorders of bone density and structure, unspecified site: Secondary | ICD-10-CM | POA: Diagnosis not present

## 2017-04-08 DIAGNOSIS — K5790 Diverticulosis of intestine, part unspecified, without perforation or abscess without bleeding: Secondary | ICD-10-CM | POA: Diagnosis not present

## 2017-05-05 DIAGNOSIS — M8588 Other specified disorders of bone density and structure, other site: Secondary | ICD-10-CM | POA: Diagnosis not present

## 2017-05-14 DIAGNOSIS — R05 Cough: Secondary | ICD-10-CM | POA: Diagnosis not present

## 2017-05-14 DIAGNOSIS — H1045 Other chronic allergic conjunctivitis: Secondary | ICD-10-CM | POA: Diagnosis not present

## 2017-05-14 DIAGNOSIS — J300 Vasomotor rhinitis: Secondary | ICD-10-CM | POA: Diagnosis not present

## 2017-05-14 DIAGNOSIS — K219 Gastro-esophageal reflux disease without esophagitis: Secondary | ICD-10-CM | POA: Diagnosis not present

## 2017-06-09 DIAGNOSIS — H524 Presbyopia: Secondary | ICD-10-CM | POA: Diagnosis not present

## 2017-06-09 DIAGNOSIS — H2513 Age-related nuclear cataract, bilateral: Secondary | ICD-10-CM | POA: Diagnosis not present

## 2017-06-09 DIAGNOSIS — H16223 Keratoconjunctivitis sicca, not specified as Sjogren's, bilateral: Secondary | ICD-10-CM | POA: Diagnosis not present

## 2017-06-09 DIAGNOSIS — H5203 Hypermetropia, bilateral: Secondary | ICD-10-CM | POA: Diagnosis not present

## 2017-06-09 DIAGNOSIS — H52223 Regular astigmatism, bilateral: Secondary | ICD-10-CM | POA: Diagnosis not present

## 2017-07-21 DIAGNOSIS — Z23 Encounter for immunization: Secondary | ICD-10-CM | POA: Diagnosis not present

## 2017-09-10 DIAGNOSIS — J45909 Unspecified asthma, uncomplicated: Secondary | ICD-10-CM | POA: Diagnosis not present

## 2017-09-10 DIAGNOSIS — J320 Chronic maxillary sinusitis: Secondary | ICD-10-CM | POA: Diagnosis not present

## 2017-10-14 HISTORY — PX: LAPAROSCOPIC ABDOMINAL EXPLORATION: SHX6249

## 2017-10-20 DIAGNOSIS — R1030 Lower abdominal pain, unspecified: Secondary | ICD-10-CM | POA: Diagnosis not present

## 2017-10-20 DIAGNOSIS — K56609 Unspecified intestinal obstruction, unspecified as to partial versus complete obstruction: Secondary | ICD-10-CM | POA: Diagnosis not present

## 2017-10-20 DIAGNOSIS — R112 Nausea with vomiting, unspecified: Secondary | ICD-10-CM | POA: Diagnosis not present

## 2017-10-20 DIAGNOSIS — R109 Unspecified abdominal pain: Secondary | ICD-10-CM | POA: Diagnosis not present

## 2017-10-20 DIAGNOSIS — R142 Eructation: Secondary | ICD-10-CM | POA: Diagnosis not present

## 2017-10-20 DIAGNOSIS — Z886 Allergy status to analgesic agent status: Secondary | ICD-10-CM | POA: Diagnosis not present

## 2017-10-20 DIAGNOSIS — Z888 Allergy status to other drugs, medicaments and biological substances status: Secondary | ICD-10-CM | POA: Diagnosis not present

## 2017-10-20 DIAGNOSIS — I10 Essential (primary) hypertension: Secondary | ICD-10-CM | POA: Diagnosis not present

## 2017-10-20 DIAGNOSIS — Z90710 Acquired absence of both cervix and uterus: Secondary | ICD-10-CM | POA: Diagnosis not present

## 2017-10-20 DIAGNOSIS — K56600 Partial intestinal obstruction, unspecified as to cause: Secondary | ICD-10-CM | POA: Diagnosis not present

## 2017-10-20 DIAGNOSIS — K5652 Intestinal adhesions [bands] with complete obstruction: Secondary | ICD-10-CM | POA: Diagnosis not present

## 2017-10-20 DIAGNOSIS — K5730 Diverticulosis of large intestine without perforation or abscess without bleeding: Secondary | ICD-10-CM | POA: Diagnosis not present

## 2017-10-20 DIAGNOSIS — Z79899 Other long term (current) drug therapy: Secondary | ICD-10-CM | POA: Diagnosis not present

## 2017-10-20 DIAGNOSIS — R110 Nausea: Secondary | ICD-10-CM | POA: Diagnosis not present

## 2017-10-20 DIAGNOSIS — Z8719 Personal history of other diseases of the digestive system: Secondary | ICD-10-CM | POA: Diagnosis not present

## 2017-10-20 DIAGNOSIS — K5900 Constipation, unspecified: Secondary | ICD-10-CM | POA: Diagnosis present

## 2017-10-20 DIAGNOSIS — R1084 Generalized abdominal pain: Secondary | ICD-10-CM | POA: Diagnosis not present

## 2017-10-20 DIAGNOSIS — R630 Anorexia: Secondary | ICD-10-CM | POA: Diagnosis not present

## 2017-10-20 HISTORY — DX: Unspecified intestinal obstruction, unspecified as to partial versus complete obstruction: K56.609

## 2017-11-21 DIAGNOSIS — R1310 Dysphagia, unspecified: Secondary | ICD-10-CM | POA: Diagnosis not present

## 2017-11-21 DIAGNOSIS — R070 Pain in throat: Secondary | ICD-10-CM | POA: Diagnosis not present

## 2017-12-08 DIAGNOSIS — R070 Pain in throat: Secondary | ICD-10-CM | POA: Diagnosis not present

## 2017-12-17 DIAGNOSIS — M2550 Pain in unspecified joint: Secondary | ICD-10-CM | POA: Diagnosis not present

## 2017-12-22 DIAGNOSIS — R51 Headache: Secondary | ICD-10-CM | POA: Diagnosis not present

## 2017-12-22 DIAGNOSIS — M797 Fibromyalgia: Secondary | ICD-10-CM | POA: Diagnosis not present

## 2017-12-22 DIAGNOSIS — I10 Essential (primary) hypertension: Secondary | ICD-10-CM | POA: Diagnosis not present

## 2017-12-22 DIAGNOSIS — M2550 Pain in unspecified joint: Secondary | ICD-10-CM | POA: Diagnosis not present

## 2018-01-09 ENCOUNTER — Other Ambulatory Visit: Payer: Self-pay | Admitting: Family Medicine

## 2018-01-09 DIAGNOSIS — Z1231 Encounter for screening mammogram for malignant neoplasm of breast: Secondary | ICD-10-CM

## 2018-01-15 DIAGNOSIS — R0781 Pleurodynia: Secondary | ICD-10-CM | POA: Diagnosis not present

## 2018-01-15 DIAGNOSIS — R05 Cough: Secondary | ICD-10-CM | POA: Diagnosis not present

## 2018-01-15 DIAGNOSIS — J209 Acute bronchitis, unspecified: Secondary | ICD-10-CM | POA: Diagnosis not present

## 2018-01-15 DIAGNOSIS — J45901 Unspecified asthma with (acute) exacerbation: Secondary | ICD-10-CM | POA: Diagnosis not present

## 2018-01-15 DIAGNOSIS — R062 Wheezing: Secondary | ICD-10-CM | POA: Diagnosis not present

## 2018-02-13 DIAGNOSIS — R319 Hematuria, unspecified: Secondary | ICD-10-CM | POA: Diagnosis not present

## 2018-02-13 DIAGNOSIS — R109 Unspecified abdominal pain: Secondary | ICD-10-CM | POA: Diagnosis not present

## 2018-02-13 DIAGNOSIS — R8290 Unspecified abnormal findings in urine: Secondary | ICD-10-CM | POA: Diagnosis not present

## 2018-02-18 ENCOUNTER — Ambulatory Visit
Admission: RE | Admit: 2018-02-18 | Discharge: 2018-02-18 | Disposition: A | Discharge status: Home / Self Care | Payer: Medicare Other | Source: Ambulatory Visit | Attending: Family Medicine | Admitting: Family Medicine

## 2018-02-18 DIAGNOSIS — Z1231 Encounter for screening mammogram for malignant neoplasm of breast: Secondary | ICD-10-CM

## 2018-03-05 DIAGNOSIS — R310 Gross hematuria: Secondary | ICD-10-CM | POA: Diagnosis not present

## 2018-03-05 DIAGNOSIS — E569 Vitamin deficiency, unspecified: Secondary | ICD-10-CM | POA: Diagnosis not present

## 2018-03-05 DIAGNOSIS — R1084 Generalized abdominal pain: Secondary | ICD-10-CM | POA: Diagnosis not present

## 2018-03-05 DIAGNOSIS — Z79899 Other long term (current) drug therapy: Secondary | ICD-10-CM | POA: Diagnosis not present

## 2018-03-05 DIAGNOSIS — I10 Essential (primary) hypertension: Secondary | ICD-10-CM | POA: Diagnosis not present

## 2018-03-20 DIAGNOSIS — R1032 Left lower quadrant pain: Secondary | ICD-10-CM | POA: Diagnosis not present

## 2018-03-20 DIAGNOSIS — R109 Unspecified abdominal pain: Secondary | ICD-10-CM | POA: Diagnosis not present

## 2018-03-20 DIAGNOSIS — K8020 Calculus of gallbladder without cholecystitis without obstruction: Secondary | ICD-10-CM | POA: Diagnosis not present

## 2018-03-20 DIAGNOSIS — K5730 Diverticulosis of large intestine without perforation or abscess without bleeding: Secondary | ICD-10-CM | POA: Diagnosis not present

## 2018-06-08 DIAGNOSIS — M25512 Pain in left shoulder: Secondary | ICD-10-CM | POA: Diagnosis not present

## 2018-06-08 DIAGNOSIS — M797 Fibromyalgia: Secondary | ICD-10-CM | POA: Diagnosis not present

## 2018-06-08 DIAGNOSIS — E079 Disorder of thyroid, unspecified: Secondary | ICD-10-CM | POA: Diagnosis not present

## 2018-06-08 DIAGNOSIS — G4700 Insomnia, unspecified: Secondary | ICD-10-CM | POA: Diagnosis not present

## 2018-06-08 DIAGNOSIS — K5900 Constipation, unspecified: Secondary | ICD-10-CM | POA: Diagnosis not present

## 2018-06-08 DIAGNOSIS — J0140 Acute pansinusitis, unspecified: Secondary | ICD-10-CM | POA: Diagnosis not present

## 2018-06-08 DIAGNOSIS — K5790 Diverticulosis of intestine, part unspecified, without perforation or abscess without bleeding: Secondary | ICD-10-CM | POA: Diagnosis not present

## 2018-06-08 DIAGNOSIS — E782 Mixed hyperlipidemia: Secondary | ICD-10-CM | POA: Diagnosis not present

## 2018-06-08 DIAGNOSIS — Z0000 Encounter for general adult medical examination without abnormal findings: Secondary | ICD-10-CM | POA: Diagnosis not present

## 2018-06-08 DIAGNOSIS — K219 Gastro-esophageal reflux disease without esophagitis: Secondary | ICD-10-CM | POA: Diagnosis not present

## 2018-06-08 DIAGNOSIS — J45901 Unspecified asthma with (acute) exacerbation: Secondary | ICD-10-CM | POA: Diagnosis not present

## 2018-06-08 DIAGNOSIS — I10 Essential (primary) hypertension: Secondary | ICD-10-CM | POA: Diagnosis not present

## 2018-06-10 DIAGNOSIS — M25512 Pain in left shoulder: Secondary | ICD-10-CM | POA: Diagnosis not present

## 2018-06-10 DIAGNOSIS — M19012 Primary osteoarthritis, left shoulder: Secondary | ICD-10-CM | POA: Diagnosis not present

## 2018-06-17 DIAGNOSIS — E079 Disorder of thyroid, unspecified: Secondary | ICD-10-CM | POA: Diagnosis not present

## 2018-06-17 DIAGNOSIS — I10 Essential (primary) hypertension: Secondary | ICD-10-CM | POA: Diagnosis not present

## 2018-06-17 DIAGNOSIS — E782 Mixed hyperlipidemia: Secondary | ICD-10-CM | POA: Diagnosis not present

## 2018-07-07 DIAGNOSIS — Z23 Encounter for immunization: Secondary | ICD-10-CM | POA: Diagnosis not present

## 2018-08-13 DIAGNOSIS — K210 Gastro-esophageal reflux disease with esophagitis: Secondary | ICD-10-CM | POA: Diagnosis not present

## 2018-08-13 DIAGNOSIS — R1013 Epigastric pain: Secondary | ICD-10-CM | POA: Diagnosis not present

## 2018-08-13 DIAGNOSIS — E039 Hypothyroidism, unspecified: Secondary | ICD-10-CM | POA: Diagnosis not present

## 2018-08-13 DIAGNOSIS — R0789 Other chest pain: Secondary | ICD-10-CM | POA: Diagnosis not present

## 2018-08-13 DIAGNOSIS — R1310 Dysphagia, unspecified: Secondary | ICD-10-CM | POA: Diagnosis not present

## 2018-08-14 DIAGNOSIS — E039 Hypothyroidism, unspecified: Secondary | ICD-10-CM | POA: Diagnosis not present

## 2018-08-14 DIAGNOSIS — M542 Cervicalgia: Secondary | ICD-10-CM | POA: Diagnosis not present

## 2018-08-14 DIAGNOSIS — E041 Nontoxic single thyroid nodule: Secondary | ICD-10-CM | POA: Diagnosis not present

## 2018-08-14 DIAGNOSIS — R0989 Other specified symptoms and signs involving the circulatory and respiratory systems: Secondary | ICD-10-CM | POA: Diagnosis not present

## 2018-08-14 DIAGNOSIS — R1310 Dysphagia, unspecified: Secondary | ICD-10-CM | POA: Diagnosis not present

## 2018-09-16 ENCOUNTER — Telehealth: Payer: Self-pay | Admitting: Gastroenterology

## 2018-09-16 NOTE — Telephone Encounter (Signed)
Pt states that her acid reflux has gotten worse despite taking dexilant. She just scheduled on appt with Dr. Ardis Hughs in Jan but wants to know if she could have some advise on what to do in the meantime.

## 2018-09-16 NOTE — Telephone Encounter (Signed)
I called the pt and discussed anti reflux precautions and advised she can take protonix BID until appt.  She will try that and keep appt as planned or call if needed prior

## 2018-10-02 DIAGNOSIS — J0100 Acute maxillary sinusitis, unspecified: Secondary | ICD-10-CM | POA: Diagnosis not present

## 2018-10-02 DIAGNOSIS — R05 Cough: Secondary | ICD-10-CM | POA: Diagnosis not present

## 2018-10-14 HISTORY — PX: UPPER GASTROINTESTINAL ENDOSCOPY: SHX188

## 2018-11-03 ENCOUNTER — Ambulatory Visit: Payer: Medicare Other | Admitting: Gastroenterology

## 2018-11-03 DIAGNOSIS — H52223 Regular astigmatism, bilateral: Secondary | ICD-10-CM | POA: Diagnosis not present

## 2018-11-03 DIAGNOSIS — H25813 Combined forms of age-related cataract, bilateral: Secondary | ICD-10-CM | POA: Diagnosis not present

## 2018-12-01 ENCOUNTER — Encounter: Payer: Self-pay | Admitting: Gastroenterology

## 2018-12-01 ENCOUNTER — Ambulatory Visit (INDEPENDENT_AMBULATORY_CARE_PROVIDER_SITE_OTHER): Payer: Medicare Other | Admitting: Gastroenterology

## 2018-12-01 ENCOUNTER — Encounter

## 2018-12-01 VITALS — BP 132/60 | HR 68 | Ht 60.0 in | Wt 141.0 lb

## 2018-12-01 DIAGNOSIS — R1310 Dysphagia, unspecified: Secondary | ICD-10-CM

## 2018-12-01 DIAGNOSIS — K219 Gastro-esophageal reflux disease without esophagitis: Secondary | ICD-10-CM | POA: Diagnosis not present

## 2018-12-01 DIAGNOSIS — N951 Menopausal and female climacteric states: Secondary | ICD-10-CM | POA: Diagnosis not present

## 2018-12-01 DIAGNOSIS — R490 Dysphonia: Secondary | ICD-10-CM | POA: Diagnosis not present

## 2018-12-01 DIAGNOSIS — R5383 Other fatigue: Secondary | ICD-10-CM | POA: Diagnosis not present

## 2018-12-01 DIAGNOSIS — E039 Hypothyroidism, unspecified: Secondary | ICD-10-CM | POA: Diagnosis not present

## 2018-12-01 NOTE — Patient Instructions (Addendum)
You will be set up for an upper endoscopy for dysphagia, GERD, hoarseness.  You should change the way you are taking your antiacid medicine (protonix) so that you are taking it 20-30 minutes prior to a decent meal as that is the way the pill is designed to work most effectively.  Normal BMI (Body Mass Index- based on height and weight) is between 23 and 30. Your BMI today is Body mass index is 27.54 kg/m. Marland Kitchen Please consider follow up  regarding your BMI with your Primary Care Provider.  Thank you for entrusting me with your care and choosing [facility].  Dr Ardis Hughs

## 2018-12-01 NOTE — Progress Notes (Signed)
Review of pertinent gastrointestinal problems: 1. Routine risk for colon cancer: Colonoscopy Dr. Lavina Hamman 07/2011 at [facility]) done for "screening... Last colonoscopy: 1993."  Findings diverticulosis throughout the colon, internal hemorrhoids, no polyps. He recommended repeat colonoscopy in 10 years. Colonoscopy Dr. Ardis Hughs 2017 found diverticlosis throughout and hemorrhoids. 2. Chronic epigastric pains:  EGD Dr. Lavina Hamman 07/2011 done for "epigastric abd pain, RUQ pain." Findings small HH, otherwise normal. He recommended abd [place]. Abd [place] 04/2016 mild fatty liver, otherwise normal examination.  EGD 06/2016 Dr. Thalia Party RUQ, epigastric abd pains: found non-specific gastritis.  Biopsies show no H. Pylori.   HPI: This is a pleasant 68 year old woman whom I last saw the time of a colonoscopy about 2 years ago.  Cough since august, she is hoarse.  She has nodule on her thyroid.  She has had heartburn and pyrosis for years.  She describes some acid regurgitation.  Mild pill associated dysphasia and also cold liquids cause dysphasia as well.  Her weight has been increasing most recently.  Had SBO about a year ago, required x-lap.  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  She is not sure if this was the cause of her hoarseness or not however.   Several of her siblings have had cancer ENT cancer, brother, 2 sisters to lung cancer., Sister with brain tumor., Sister with thyroid nodule, pre-cancerous.  Changed to protonix BID recently and that has helped but incompletely..  Takes 3 hours before breakfast.  And 1.5 hours before dinner.  Famotidine 20mg  at bedtime.  She's been gaining weight.  Chief complaint is GERD, hoarseness, mild intermittent dysphasia  ROS: complete GI ROS as described in HPI, all other review negative.  Constitutional:  No unintentional weight loss   Past Medical History:  Diagnosis Date  . Allergic rhinitis   . Anemia   . Asthma   . Colon polyp    . Diverticulosis   . Fibromyalgia   . GERD (gastroesophageal reflux disease)   . Hiatal hernia   . Hypercholesteremia   . Hypothyroidism   . Migraine headache   . Osteoarthritis   . Osteopenia   . Pulmonary hypertension ([place])    pt denies  . Reflux   . Seasonal allergies     Past Surgical History:  Procedure Laterality Date  . APPENDECTOMY  1982  . BREAST EXCISIONAL BIOPSY Right 1995   benign  . BREAST LUMPECTOMY  1991  . CARPAL TUNNEL RELEASE  2006   Right wrist  . CESAREAN SECTION     x 2  . COLON RESECTION  1990  . COLONOSCOPY    . NASAL SEPTUM SURGERY  2004  . small bowel blockage    . TONSILLECTOMY    . TOTAL ABDOMINAL HYSTERECTOMY W/ BILATERAL SALPINGOOPHORECTOMY  1988  . TUBAL LIGATION  1974  . UPPER GI ENDOSCOPY      Current Outpatient Medications  Medication Sig Dispense Refill  . albuterol (PROAIR HFA) 108 (90 BASE) MCG/ACT inhaler Inhale 2 puffs into the lungs every 6 (six) hours as needed.      Marland Kitchen albuterol (PROVENTIL) (2.5 MG/3ML) 0.083% nebulizer solution Take 2.5 mg by nebulization every 6 (six) hours as needed.      Francia Greaves THYROID 15 MG tablet     . ARMOUR THYROID 30 MG tablet     . Ascorbic Acid (VITAMIN C) 1000 MG tablet Take 3,000 mg by mouth daily.     Marland Kitchen atenolol (TENORMIN) 25 MG  tablet Take 25 mg by mouth daily.      Marland Kitchen azelastine (ASTELIN) 0.1 % nasal spray Place 1 spray into both nostrils 2 (two) times daily. Use in each nostril as directed    . clobetasol cream (TEMOVATE) 8.54 % Apply 1 application topically as needed.    . famotidine (PEPCID) 20 MG tablet Take 20 mg by mouth daily.    . fluticasone (FLONASE) 50 MCG/ACT nasal spray Place 2 sprays into the nose daily.      . Lactobacillus (ACIDOPHILUS PO) Take 1 capsule by mouth daily.      Marland Kitchen loratadine (CLARITIN) 10 MG tablet Take 10 mg by mouth daily.    . montelukast (SINGULAIR) 10 MG tablet Take 10 mg by mouth at bedtime.    . Multiple Vitamin (MULTIVITAMIN) tablet Take 1 tablet by mouth  2 (two) times daily.     . NON FORMULARY Take 1 capsule by mouth daily.    . NON FORMULARY Take 2 tablets by mouth 2 (two) times daily. Glucose Optimizer    . pantoprazole (PROTONIX) 40 MG tablet Take 40 mg by mouth daily.    Marland Kitchen tiZANidine (ZANAFLEX) 4 MG tablet Take 4 mg by mouth every 6 (six) hours as needed for muscle spasms.    . ValACYclovir HCl (VALTREX PO) Take by mouth as needed.    Marland Kitchen VITAMIN A PO Take 1 tablet by mouth daily.    . vitamin B-12 (CYANOCOBALAMIN) 1000 MCG tablet Take 1,000 mcg by mouth daily.      Marland Kitchen zolpidem (AMBIEN) 10 MG tablet Take 5 mg by mouth at bedtime.      Current Facility-Administered Medications  Medication Dose Route Frequency Provider Last Rate Last Dose  . 0.9 %  sodium chloride infusion  500 mL Intravenous Continuous Milus Banister, MD      . 0.9 %  sodium chloride infusion  500 mL Intravenous Continuous Milus Banister, MD        Allergies as of 12/01/2018 - Review Complete 12/01/2018  Allergen Reaction Noted  . Avelox [moxifloxacin hcl in nacl]  03/29/2011  . Cephalexin Hives 03/29/2011  . Codeine  03/29/2011  . Erythromycin Hives 03/29/2011  . Flagyl [metronidazole hcl]  03/29/2011  . Propranolol Hives 04/15/2016  . Septra [bactrim] Hives 03/29/2011  . Sulfa antibiotics Hives 03/29/2011  . Tetanus toxoids  03/29/2011  . Tramadol Hives 04/05/2014    Family History  Problem Relation Age of Onset  . Hyperlipidemia Father   . Hypertension Father   . Arthritis Father   . Diabetes Father   . Asthma Father   . Hypertension Mother   . Hyperlipidemia Mother   . Macular degeneration Mother   . Arthritis Mother   . Throat cancer Brother        lung, and tongue  . Diabetes type II Sister   . Lung cancer Sister   . Breast cancer Maternal Aunt   . Lung cancer Other        uncle  . Emphysema Maternal Aunt   . Emphysema Maternal Uncle   . Clotting disorder Sister   . Breast cancer Cousin   . Colon cancer Neg Hx   . Esophageal cancer Neg  Hx   . Rectal cancer Neg Hx   . Stomach cancer Neg Hx     Social History   Socioeconomic History  . Marital status: Married    Spouse name: Windy Canny  . Number of children: 2  . Years of education:  Not on file  . Highest education level: Not on file  Occupational History  . Occupation: homemaker  Social Needs  . Financial resource strain: Not on file  . Food insecurity:    Worry: Not on file    Inability: Not on file  . Transportation needs:    Medical: Not on file    Non-medical: Not on file  Tobacco Use  . Smoking status: Never Smoker  . Smokeless tobacco: Never Used  Substance and Sexual Activity  . Alcohol use: Yes    Alcohol/week: 1.0 standard drinks    Types: 1 Glasses of wine per week  . Drug use: No  . Sexual activity: Not on file  Lifestyle  . Physical activity:    Days per week: Not on file    Minutes per session: Not on file  . Stress: Not on file  Relationships  . Social connections:    Talks on phone: Not on file    Gets together: Not on file    Attends religious service: Not on file    Active member of club or organization: Not on file    Attends meetings of clubs or organizations: Not on file    Relationship status: Not on file  . Intimate partner violence:    Fear of current or ex partner: Not on file    Emotionally abused: Not on file    Physically abused: Not on file    Forced sexual activity: Not on file  Other Topics Concern  . Not on file  Social History Narrative  . Not on file     Physical Exam: BP 132/60   Pulse 68   Ht 5' (1.524 m)   Wt 141 lb (64 kg)   BMI 27.54 kg/m  Constitutional: generally well-appearing Psychiatric: alert and oriented x3 Abdomen: soft, nontender, nondistended, no obvious ascites, no peritoneal signs, normal bowel sounds No peripheral edema noted in lower extremities  Assessment and plan: 68 y.o. female with GERD, hoarseness, mild intermittent dysphasia  Proton pump inhibitor twice daily is  helping.  She is not taking at the correct time in relation to her meals and she will alter that.  She is very nervous about the possibility that she has underlying malignancy.  I tried to reassure her that that is very unlikely given that she is gaining weight, had an EGD 2 or 3 years ago.  She said she would feel much better if she had an upper endoscopy again and so we agreed to go ahead with that for her.  Please see the "Patient Instructions" section for addition details about the plan.  Owens Loffler, MD [facility] 12/01/2018, 3:51 PM

## 2018-12-07 DIAGNOSIS — R5383 Other fatigue: Secondary | ICD-10-CM | POA: Diagnosis not present

## 2018-12-07 DIAGNOSIS — N951 Menopausal and female climacteric states: Secondary | ICD-10-CM | POA: Diagnosis not present

## 2018-12-07 DIAGNOSIS — Z72820 Sleep deprivation: Secondary | ICD-10-CM | POA: Diagnosis not present

## 2018-12-07 DIAGNOSIS — E039 Hypothyroidism, unspecified: Secondary | ICD-10-CM | POA: Diagnosis not present

## 2018-12-07 DIAGNOSIS — N898 Other specified noninflammatory disorders of vagina: Secondary | ICD-10-CM | POA: Diagnosis not present

## 2018-12-08 ENCOUNTER — Encounter: Payer: Self-pay | Admitting: Gastroenterology

## 2018-12-08 ENCOUNTER — Ambulatory Visit (AMBULATORY_SURGERY_CENTER): Payer: Medicare Other | Admitting: Gastroenterology

## 2018-12-08 VITALS — BP 108/58 | HR 59 | Temp 97.3°F | Resp 10 | Ht 60.0 in | Wt 141.0 lb

## 2018-12-08 DIAGNOSIS — R1310 Dysphagia, unspecified: Secondary | ICD-10-CM

## 2018-12-08 MED ORDER — SODIUM CHLORIDE 0.9 % IV SOLN: 500.0000 mL | Freq: Once | INTRAVENOUS | Status: DC

## 2018-12-08 NOTE — Progress Notes (Signed)
PT taken to PACU. Monitors in place. VSS. Report given to RN. 

## 2018-12-08 NOTE — Op Note (Signed)
[place] Patient Name: Rita Levine Procedure Date: 12/08/2018 9:52 AM MRN: [id number] Endoscopist: Milus Banister , MD Age: 68 Referring MD:  Date of Birth: 1951/05/11 Gender: Female Account #: [id number] Procedure:                Upper GI endoscopy Indications:              New pill associated and cold liquid associated                            dysphagia; also Chronic epigastric pains: EGD Dr.                            Lavina Hamman 07/2011 done for "epigastric abd pain, RUQ                            pain." Findings small HH, otherwise normal. He                            recommended abd [place]. Abd [place] 04/2016 mild fatty liver,                            otherwise normal examination. EGD 06/2016 Dr.                            Thalia Party RUQ, epigastric abd pains: found                            non-specific gastritis. Biopsies showed no H.                            Pylori. Medicines:                Monitored Anesthesia Care Procedure:                Pre-Anesthesia Assessment:                           - Prior to the procedure, a History and Physical                            was performed, and patient medications and                            allergies were reviewed. The patient's tolerance of                            previous anesthesia was also reviewed. The risks                            and benefits of the procedure and the sedation                            options and risks were discussed with the patient.  All questions were answered, and informed consent                            was obtained. Prior Anticoagulants: The patient has                            taken no previous anticoagulant or antiplatelet                            agents. ASA Grade Assessment: II - A patient with                            mild systemic disease. After reviewing the risks                            and benefits, the patient was deemed in              satisfactory condition to undergo the procedure.                           After obtaining informed consent, the endoscope was                            passed under direct vision. Throughout the                            procedure, the patient's blood pressure, pulse, and                            oxygen saturations were monitored continuously. The                            Endoscope was introduced through the mouth, and                            advanced to the second part of duodenum. The upper                            GI endoscopy was accomplished without difficulty.                            The patient tolerated the procedure well. Scope In: Scope Out: Findings:                 The esophagus was normal.                           The stomach was normal.                           The examined duodenum was normal. Complications:            No immediate complications. Estimated blood loss:                            None. Estimated Blood  Loss:     Estimated blood loss: none. Impression:               - Normal UGI tract. Recommendation:           - Patient has a contact number available for                            emergencies. The signs and symptoms of potential                            delayed complications were discussed with the                            patient. Return to normal activities tomorrow.                            Written discharge instructions were provided to the                            patient.                           - Resume previous diet.                           - Continue present medications. Milus Banister, MD 12/08/2018 10:05:38 AM This report has been signed electronically.

## 2018-12-08 NOTE — Patient Instructions (Signed)
YOU HAD AN ENDOSCOPIC PROCEDURE TODAY AT THE  ENDOSCOPY CENTER:   Refer to the procedure report that was given to you for any specific questions about what was found during the examination.  If the procedure report does not answer your questions, please call your gastroenterologist to clarify.  If you requested that your care partner not be given the details of your procedure findings, then the procedure report has been included in a sealed envelope for you to review at your convenience later.  YOU SHOULD EXPECT: Some feelings of bloating in the abdomen. Passage of more gas than usual.  Walking can help get rid of the air that was put into your GI tract during the procedure and reduce the bloating. If you had a lower endoscopy (such as a colonoscopy or flexible sigmoidoscopy) you may notice spotting of blood in your stool or on the toilet paper. If you underwent a bowel prep for your procedure, you may not have a normal bowel movement for a few days.  Please Note:  You might notice some irritation and congestion in your nose or some drainage.  This is from the oxygen used during your procedure.  There is no need for concern and it should clear up in a day or so.  SYMPTOMS TO REPORT IMMEDIATELY:   Following upper endoscopy (EGD)  Vomiting of blood or coffee ground material  New chest pain or pain under the shoulder blades  Painful or persistently difficult swallowing  New shortness of breath  Fever of 100F or higher  Black, tarry-looking stools  For urgent or emergent issues, a gastroenterologist can be reached at any hour by calling [phone].   DIET:  We do recommend a small meal at first, but then you may proceed to your regular diet.  Drink plenty of fluids but you should avoid alcoholic beverages for 24 hours.  ACTIVITY:  You should plan to take it easy for the rest of today and you should NOT DRIVE or use heavy machinery until tomorrow (because of the sedation medicines used  during the test).    FOLLOW UP: Our staff will call the number listed on your records the next business day following your procedure to check on you and address any questions or concerns that you may have regarding the information given to you following your procedure. If we do not reach you, we will leave a message.  However, if you are feeling well and you are not experiencing any problems, there is no need to return our call.  We will assume that you have returned to your regular daily activities without incident.  If any biopsies were taken you will be contacted by phone or by letter within the next 1-3 weeks.  Please call us at [phone] if you have not heard about the biopsies in 3 weeks.    SIGNATURES/CONFIDENTIALITY: You and/or your care partner have signed paperwork which will be entered into your electronic medical record.  These signatures attest to the fact that that the information above on your After Visit Summary has been reviewed and is understood.  Full responsibility of the confidentiality of this discharge information lies with you and/or your care-partner. 

## 2018-12-09 ENCOUNTER — Telehealth: Payer: Self-pay

## 2018-12-09 NOTE — Telephone Encounter (Signed)
  Follow up Call-  Call back number 12/08/2018 08/05/2016 06/14/2016  Post procedure Call Back phone  # [phone]  Permission to leave phone message Yes Yes Yes  Some recent data might be hidden     Patient questions:  Do you have a fever, pain , or abdominal swelling? No. Pain Score  0 *  Have you tolerated food without any problems? Yes.    Have you been able to return to your normal activities? Yes.    Do you have any questions about your discharge instructions: Diet   No. Medications  No. Follow up visit  No.  Do you have questions or concerns about your Care? No.  Actions: * If pain score is 4 or above: No action needed, pain <4.

## 2018-12-09 NOTE — Telephone Encounter (Signed)
First post procedure follow up call, no answer 

## 2018-12-10 ENCOUNTER — Telehealth: Payer: Self-pay

## 2018-12-10 DIAGNOSIS — K589 Irritable bowel syndrome without diarrhea: Secondary | ICD-10-CM | POA: Diagnosis not present

## 2018-12-10 DIAGNOSIS — R6889 Other general symptoms and signs: Secondary | ICD-10-CM | POA: Diagnosis not present

## 2018-12-10 DIAGNOSIS — J189 Pneumonia, unspecified organism: Secondary | ICD-10-CM | POA: Diagnosis not present

## 2018-12-10 NOTE — Telephone Encounter (Signed)
  Follow up Call-  Call back number 12/08/2018 08/05/2016 06/14/2016  Post procedure Call Back phone  # [phone]  Permission to leave phone message Yes Yes Yes  Some recent data might be hidden     Patient questions:  Do you have a fever, pain , or abdominal swelling? No. Pain Score  0 *  Have you tolerated food without any problems? Yes.    Have you been able to return to your normal activities? Yes.    Do you have any questions about your discharge instructions: Diet   No. Medications  No. Follow up visit  No.  Do you have questions or concerns about your Care? No.  Actions: * If pain score is 4 or above: No action needed, pain <4.

## 2018-12-15 ENCOUNTER — Telehealth: Payer: Self-pay | Admitting: Gastroenterology

## 2018-12-15 NOTE — Telephone Encounter (Signed)
The pt developed an URI after her procedure and has since developed "abnormal stools" very small and not normal for her.  She was prescribed an abx for the URI and the symptoms developed after starting.  She was advised to call the prescriber and let them know what symptoms she is having.

## 2018-12-15 NOTE — Telephone Encounter (Signed)
Pt called to inform that she has not had a normal bm since her procedure last Tuesday, pls call her.

## 2018-12-22 DIAGNOSIS — E039 Hypothyroidism, unspecified: Secondary | ICD-10-CM | POA: Diagnosis not present

## 2018-12-22 DIAGNOSIS — M545 Low back pain: Secondary | ICD-10-CM | POA: Diagnosis not present

## 2018-12-22 DIAGNOSIS — I10 Essential (primary) hypertension: Secondary | ICD-10-CM | POA: Diagnosis not present

## 2018-12-22 DIAGNOSIS — E782 Mixed hyperlipidemia: Secondary | ICD-10-CM | POA: Diagnosis not present

## 2018-12-22 DIAGNOSIS — J4520 Mild intermittent asthma, uncomplicated: Secondary | ICD-10-CM | POA: Diagnosis not present

## 2018-12-22 DIAGNOSIS — N951 Menopausal and female climacteric states: Secondary | ICD-10-CM | POA: Diagnosis not present

## 2018-12-22 DIAGNOSIS — G4700 Insomnia, unspecified: Secondary | ICD-10-CM | POA: Diagnosis not present

## 2018-12-22 DIAGNOSIS — R05 Cough: Secondary | ICD-10-CM | POA: Diagnosis not present

## 2018-12-22 DIAGNOSIS — K589 Irritable bowel syndrome without diarrhea: Secondary | ICD-10-CM | POA: Diagnosis not present

## 2018-12-22 DIAGNOSIS — K219 Gastro-esophageal reflux disease without esophagitis: Secondary | ICD-10-CM | POA: Diagnosis not present

## 2018-12-22 DIAGNOSIS — M797 Fibromyalgia: Secondary | ICD-10-CM | POA: Diagnosis not present

## 2018-12-22 DIAGNOSIS — G43009 Migraine without aura, not intractable, without status migrainosus: Secondary | ICD-10-CM | POA: Diagnosis not present

## 2019-03-23 DIAGNOSIS — Z03818 Encounter for observation for suspected exposure to other biological agents ruled out: Secondary | ICD-10-CM | POA: Diagnosis not present

## 2019-06-11 DIAGNOSIS — Z23 Encounter for immunization: Secondary | ICD-10-CM | POA: Diagnosis not present

## 2019-06-22 ENCOUNTER — Other Ambulatory Visit: Payer: Self-pay | Admitting: Physician Assistant

## 2019-06-22 DIAGNOSIS — Z1231 Encounter for screening mammogram for malignant neoplasm of breast: Secondary | ICD-10-CM

## 2019-07-29 DIAGNOSIS — M797 Fibromyalgia: Secondary | ICD-10-CM | POA: Diagnosis not present

## 2019-07-29 DIAGNOSIS — E782 Mixed hyperlipidemia: Secondary | ICD-10-CM | POA: Diagnosis not present

## 2019-07-29 DIAGNOSIS — I10 Essential (primary) hypertension: Secondary | ICD-10-CM | POA: Diagnosis not present

## 2019-07-29 DIAGNOSIS — E041 Nontoxic single thyroid nodule: Secondary | ICD-10-CM | POA: Diagnosis not present

## 2019-07-29 DIAGNOSIS — E039 Hypothyroidism, unspecified: Secondary | ICD-10-CM | POA: Diagnosis not present

## 2019-07-29 DIAGNOSIS — G479 Sleep disorder, unspecified: Secondary | ICD-10-CM | POA: Diagnosis not present

## 2019-07-29 DIAGNOSIS — J45901 Unspecified asthma with (acute) exacerbation: Secondary | ICD-10-CM | POA: Diagnosis not present

## 2019-08-03 DIAGNOSIS — K219 Gastro-esophageal reflux disease without esophagitis: Secondary | ICD-10-CM | POA: Diagnosis not present

## 2019-08-03 DIAGNOSIS — E039 Hypothyroidism, unspecified: Secondary | ICD-10-CM | POA: Diagnosis not present

## 2019-08-03 DIAGNOSIS — K5790 Diverticulosis of intestine, part unspecified, without perforation or abscess without bleeding: Secondary | ICD-10-CM | POA: Diagnosis not present

## 2019-08-03 DIAGNOSIS — R140 Abdominal distension (gaseous): Secondary | ICD-10-CM | POA: Diagnosis not present

## 2019-08-03 DIAGNOSIS — I10 Essential (primary) hypertension: Secondary | ICD-10-CM | POA: Diagnosis not present

## 2019-08-03 DIAGNOSIS — Z23 Encounter for immunization: Secondary | ICD-10-CM | POA: Diagnosis not present

## 2019-08-03 DIAGNOSIS — E782 Mixed hyperlipidemia: Secondary | ICD-10-CM | POA: Diagnosis not present

## 2019-08-04 ENCOUNTER — Other Ambulatory Visit: Payer: Self-pay

## 2019-08-04 ENCOUNTER — Ambulatory Visit
Admission: RE | Admit: 2019-08-04 | Discharge: 2019-08-04 | Disposition: A | Discharge status: Home / Self Care | Payer: Medicare Other | Source: Ambulatory Visit | Attending: Physician Assistant | Admitting: Physician Assistant

## 2019-08-04 DIAGNOSIS — Z1231 Encounter for screening mammogram for malignant neoplasm of breast: Secondary | ICD-10-CM

## 2019-08-06 ENCOUNTER — Other Ambulatory Visit: Payer: Self-pay | Admitting: Physician Assistant

## 2019-08-06 DIAGNOSIS — R928 Other abnormal and inconclusive findings on diagnostic imaging of breast: Secondary | ICD-10-CM

## 2019-08-10 ENCOUNTER — Ambulatory Visit
Admission: RE | Admit: 2019-08-10 | Discharge: 2019-08-10 | Disposition: A | Discharge status: Home / Self Care | Payer: Medicare Other | Source: Ambulatory Visit | Attending: Physician Assistant | Admitting: Physician Assistant

## 2019-08-10 ENCOUNTER — Other Ambulatory Visit: Payer: Self-pay

## 2019-08-10 DIAGNOSIS — R922 Inconclusive mammogram: Secondary | ICD-10-CM | POA: Diagnosis not present

## 2019-08-10 DIAGNOSIS — R928 Other abnormal and inconclusive findings on diagnostic imaging of breast: Secondary | ICD-10-CM

## 2019-08-10 DIAGNOSIS — N6489 Other specified disorders of breast: Secondary | ICD-10-CM | POA: Diagnosis not present

## 2019-08-20 ENCOUNTER — Telehealth: Payer: Self-pay | Admitting: Gastroenterology

## 2019-08-20 NOTE — Telephone Encounter (Signed)
Tried to call the pt 2 times and someone answered the phone but could not hear me.  I tried again and no answer

## 2019-08-24 NOTE — Telephone Encounter (Signed)
I have been unable to reach the pt.  Will wait for further communication from her.

## 2019-10-14 ENCOUNTER — Telehealth: Payer: Self-pay | Admitting: Gastroenterology

## 2019-10-14 NOTE — Telephone Encounter (Signed)
Left message on machine to call back  

## 2019-10-18 NOTE — Telephone Encounter (Signed)
Unable to reach pt will await further communication from the pt  

## 2019-10-18 NOTE — Telephone Encounter (Signed)
Left message on machine to call back  

## 2019-10-29 DIAGNOSIS — J019 Acute sinusitis, unspecified: Secondary | ICD-10-CM | POA: Diagnosis not present

## 2019-11-30 ENCOUNTER — Other Ambulatory Visit: Payer: Self-pay

## 2019-12-01 ENCOUNTER — Encounter: Payer: Self-pay | Admitting: Family Medicine

## 2019-12-01 ENCOUNTER — Ambulatory Visit (INDEPENDENT_AMBULATORY_CARE_PROVIDER_SITE_OTHER): Payer: Medicare HMO | Admitting: Family Medicine

## 2019-12-01 ENCOUNTER — Other Ambulatory Visit: Payer: Self-pay

## 2019-12-01 VITALS — BP 167/90 | HR 73 | Temp 98.1°F | Resp 16 | Ht <= 58 in | Wt 144.0 lb

## 2019-12-01 DIAGNOSIS — J301 Allergic rhinitis due to pollen: Secondary | ICD-10-CM

## 2019-12-01 DIAGNOSIS — E782 Mixed hyperlipidemia: Secondary | ICD-10-CM

## 2019-12-01 DIAGNOSIS — E041 Nontoxic single thyroid nodule: Secondary | ICD-10-CM

## 2019-12-01 DIAGNOSIS — E039 Hypothyroidism, unspecified: Secondary | ICD-10-CM | POA: Diagnosis not present

## 2019-12-01 DIAGNOSIS — J45909 Unspecified asthma, uncomplicated: Secondary | ICD-10-CM

## 2019-12-01 DIAGNOSIS — Z91018 Allergy to other foods: Secondary | ICD-10-CM | POA: Diagnosis not present

## 2019-12-01 DIAGNOSIS — Z7689 Persons encountering health services in other specified circumstances: Secondary | ICD-10-CM

## 2019-12-01 DIAGNOSIS — M5136 Other intervertebral disc degeneration, lumbar region: Secondary | ICD-10-CM

## 2019-12-01 DIAGNOSIS — E669 Obesity, unspecified: Secondary | ICD-10-CM

## 2019-12-01 DIAGNOSIS — J302 Other seasonal allergic rhinitis: Secondary | ICD-10-CM

## 2019-12-01 DIAGNOSIS — K219 Gastro-esophageal reflux disease without esophagitis: Secondary | ICD-10-CM

## 2019-12-01 DIAGNOSIS — K588 Other irritable bowel syndrome: Secondary | ICD-10-CM

## 2019-12-01 DIAGNOSIS — I10 Essential (primary) hypertension: Secondary | ICD-10-CM

## 2019-12-01 DIAGNOSIS — M1990 Unspecified osteoarthritis, unspecified site: Secondary | ICD-10-CM

## 2019-12-01 DIAGNOSIS — Z5320 Procedure and treatment not carried out because of patient's decision for unspecified reasons: Secondary | ICD-10-CM

## 2019-12-01 DIAGNOSIS — M797 Fibromyalgia: Secondary | ICD-10-CM

## 2019-12-01 DIAGNOSIS — G479 Sleep disorder, unspecified: Secondary | ICD-10-CM

## 2019-12-01 DIAGNOSIS — R002 Palpitations: Secondary | ICD-10-CM

## 2019-12-01 DIAGNOSIS — M48061 Spinal stenosis, lumbar region without neurogenic claudication: Secondary | ICD-10-CM

## 2019-12-01 MED ORDER — ALBUTEROL SULFATE HFA 108 (90 BASE) MCG/ACT IN AERS: 2.0000 | INHALATION_SPRAY | Freq: Four times a day (QID) | RESPIRATORY_TRACT | 5 refills | Status: DC | PRN

## 2019-12-01 MED ORDER — ARMOUR THYROID 30 MG PO TABS: 30.0000 mg | ORAL_TABLET | Freq: Every day | ORAL | 2 refills | Status: DC

## 2019-12-01 MED ORDER — HYDROCHLOROTHIAZIDE 12.5 MG PO CAPS: 12.5000 mg | ORAL_CAPSULE | ORAL | 1 refills | Status: DC

## 2019-12-01 MED ORDER — TIZANIDINE HCL 4 MG PO TABS: 4.0000 mg | ORAL_TABLET | Freq: Four times a day (QID) | ORAL | 5 refills | Status: DC | PRN

## 2019-12-01 MED ORDER — MONTELUKAST SODIUM 10 MG PO TABS: 10.0000 mg | ORAL_TABLET | Freq: Every day | ORAL | 1 refills | Status: DC

## 2019-12-01 MED ORDER — FEXOFENADINE HCL 180 MG PO TABS: 180.0000 mg | ORAL_TABLET | Freq: Every day | ORAL | 1 refills | Status: DC

## 2019-12-01 MED ORDER — ARMOUR THYROID 15 MG PO TABS: 15.0000 mg | ORAL_TABLET | Freq: Every day | ORAL | 2 refills | Status: DC

## 2019-12-01 MED ORDER — MELOXICAM 15 MG PO TABS: 15.0000 mg | ORAL_TABLET | Freq: Every day | ORAL | 1 refills | Status: DC

## 2019-12-01 MED ORDER — FLUTICASONE PROPIONATE 50 MCG/ACT NA SUSP: 2.0000 | Freq: Every day | NASAL | 5 refills | Status: DC

## 2019-12-01 MED ORDER — HYDROXYZINE PAMOATE 25 MG PO CAPS: 25.0000 mg | ORAL_CAPSULE | Freq: Every day | ORAL | 1 refills | Status: DC

## 2019-12-01 MED ORDER — BUDESONIDE-FORMOTEROL FUMARATE 80-4.5 MCG/ACT IN AERO: 2.0000 | INHALATION_SPRAY | Freq: Two times a day (BID) | RESPIRATORY_TRACT | 5 refills | Status: DC

## 2019-12-01 MED ORDER — HYOSCYAMINE SULFATE 0.125 MG PO TABS: 0.1250 mg | ORAL_TABLET | Freq: Four times a day (QID) | ORAL | 1 refills | Status: DC | PRN

## 2019-12-01 MED ORDER — PANTOPRAZOLE SODIUM 40 MG PO TBEC: 40.0000 mg | DELAYED_RELEASE_TABLET | Freq: Every day | ORAL | 1 refills | Status: DC

## 2019-12-01 MED ORDER — ATENOLOL 25 MG PO TABS: 25.0000 mg | ORAL_TABLET | Freq: Every day | ORAL | 1 refills | Status: DC

## 2019-12-01 MED ORDER — FAMOTIDINE 20 MG PO TABS: 20.0000 mg | ORAL_TABLET | Freq: Every day | ORAL | 1 refills | Status: DC

## 2019-12-01 NOTE — Progress Notes (Signed)
Patient ID: Rita Levine, female  DOB: December 19, 1950, 69 y.o.   MRN: [id number] Patient Care Team    Relationship Specialty Notifications Start End  Ma Hillock, DO PCP - General Family Medicine  12/01/19   Milus Banister, MD Attending Physician Gastroenterology  12/01/19     Chief Complaint  Patient presents with  . Establish Care    Prior PCP Sammuel Hines PA. Pt has allergies and sinus issues for years and feels she needs different medications. She has taken abx and steroid in Jan for this issues     Subjective:  Rita Levine is a 69 y.o.  female present for new patient establishment. All past medical history, surgical history, allergies, family history, immunizations, medications and social history were updated in the electronic medical record today. All recent labs, ED visits and hospitalizations within the last year were reviewed.  Gastroesophageal reflux disease, unspecified whether esophagitis present Patient reports symptoms are controlled on omeprazole and Pepcid.  Essential hypertension/HLD/Statin declined/Palpitations/Obesity (BMI 30-39.9) Pt reports compliance with atenolol and Microzide-although she admits she forgot to take them this morning. Blood pressures ranges at home within normal limits. Patient denies chest pain, shortness of breath or lower extremity edema. Pt does not daily baby ASA. Pt is not prescribed statin. Labs up-to-date 07/2019 at prior PCP  Fibromyalgia/osteoarthritis, unspecified osteoarthritis type, unspecified site/ DDD (degenerative disc disease), lumbar/Spinal stenosis of lumbar region, unspecified whether neurogenic claudication present Patient reports her fibromyalgia and arthritis symptoms are well controlled on Mobic and Zanaflex.  Seasonal allergies/Allergic rhinitis due to pollen, unspecified seasonality/ Multiple food allergies Patient reports her allergies have always been rather uncontrolled.  [place] she lived in a different  state she had food allergy testing and was allergic to many foods.  She at one time had allergy shots and this is when her allergies were the best as far as control.  She reports frequent occurrence of sinus infections and sinus headaches.  She had an MRI in 2013.  Her current regimen consist of Xyzal, Allegra, Singulair and Flonase.  She has taking Zyrtec in the past.  She reports the Xyzal was added last year but she does not feel its been helpful.  irritable bowel syndrome She reports an extensive history of diverticulitis requiring colon resection and later exploratory lap of the abdomen with removal of adhesions for short bowel obstruction.  SHe has continued irritable bowel symptoms that are controlled on Levsin.  She is established with gastroenterology.  Last colonoscopy 2017, with Dr. Ardis Hughs  Asthma, intrinsic Patient reports her asthma is rather controlled on Symbicort.  She rarely needs to use her albuterol inhaler.  Hypothyroidism, unspecified type/Thyroid nodule Thyroid nodule history.  Last ultrasound 08/17/2018 with left thyroid nodule.  Per radiology report 1 year follow-up was recommended.  Patient does endorse mild compression-like symptoms.  She states her prior PCP had ordered follow-up, but it was canceled secondary to Covid pandemic.  Patient had thyroid ultrasound completed at [facility] radiology in [place].  08/17/2018 11:58 AM EST COMPARISON:  None INDICATION: Other specified symptoms and signs involving the circulatory and respiratory systems  hypothyroid. Globus sensation, dysphagia, neck pain for several months TECHNIQUE: Thyroid ultrasound was performed, utilizing grayscale and color Doppler sonography. Exam date/time:  08/14/2018 2:52 PM FINDINGS:  Right thyroid lobe:   4 x 1.2 x 1.2 cm. No abnormal masses. Normal vascularity. Homogeneous parenchyma. Left thyroid lobe:   3.4 x 1.2 x 1.1 cm.   Heterogeneous isoechoic solid nodule in  the upper pole measures 1.1 x  0.7 x 0.8 cm. Vascularity within the nodule. Normal vascularity. Homogeneous parenchyma. Isthmus:  3 mm (AP diameter). No abnormal masses. IMPRESSION: Small left thyroid nodule measuring 1.1 cm. Recommend follow-up ultrasound in one year.   Depression screen [facility] 2/9 12/03/2019  Decreased Interest 0  Down, Depressed, Hopeless 0  PHQ - 2 Score 0   No flowsheet data found.      Fall Risk  12/03/2019  Falls in the past year? 0  Number falls in past yr: 0  Injury with Fall? 0  Follow up Falls evaluation completed     Immunization History  Administered Date(s) Administered  . Influenza-Unspecified 06/11/2019  . Pneumococcal Conjugate-13 07/03/2016  . Pneumococcal Polysaccharide-23 08/03/2019  . Tdap 05/02/2014  . Zoster 10/14/2010    No exam data present  Past Medical History:  Diagnosis Date  . Allergic rhinitis   . Anemia   . Asthma   . Chest pain, unspecified 04/07/2014  . Chicken pox   . Colon polyp   . Diverticulosis   . Dysphagia   . Fibromyalgia   . Food allergy   . Gallstone   . GERD (gastroesophageal reflux disease)   . Hiatal hernia   . Hypercholesteremia   . Hypertension   . Hypothyroidism   . IBS (irritable bowel syndrome)   . Migraine headache   . Osteoarthritis   . Osteopenia   . Pulmonary hypertension ([place])    pt denies  . Rectal bleeding   . SBO (small bowel obstruction) ([place]) 10/20/2017  . Seasonal allergies   . Thyroid nodule    Allergies  Allergen Reactions  . Sulfamethoxazole-Trimethoprim Hives  . Avelox [Moxifloxacin Hcl In Nacl]     Racing heart  . Cephalexin Hives  . Codeine     Heart racing/hives  . Erythromycin Hives  . Flagyl [Metronidazole]   . Propranolol Hives  . Septra [Bactrim] Hives  . Sulfa Antibiotics Hives  . Tetanus Toxoid Swelling    Reports a fever and headache with swelling.   . Tetanus Toxoids   . Tramadol Hives  . Statins Other (See Comments)    Myalgia    Past Surgical History:  Procedure Laterality  Date  . APPENDECTOMY  1982  . BREAST EXCISIONAL BIOPSY Right 1995   benign  . BREAST LUMPECTOMY  1991  . CARPAL TUNNEL RELEASE Right 2006   Right wrist  . CESAREAN SECTION     x 2  . COLON RESECTION  1990  . COLONOSCOPY    . IMAGE MRI brain:  04/2012   No focal IAC or inner ear lesion to explain hearing loss. Slightly greater than expected number of subcortical T2- hyperintensities bilaterally.  These are nonspecific.  They can be seen in the setting of chronic migraine headaches, demyelinating process, chronic microvascular ischemic, prior infection or inflammation, or vasculitis  . IMAGE MRI lumbar:  05/01/2006   Mild central canal stenosis with facet arthropathy and mildly bulging disc at L4-5.  There is mild narrowing in the left lateral recess at this level.  No definite neural impingement.  Appearance at this level has not markedly Moderately severe facet arthropathy at L5-S1 with mild interval progression of a diffuse broad based disc bulge.  There is mild biforaminal narrowing.  Central canal is open  . LAPAROSCOPIC ABDOMINAL EXPLORATION  2019   adhesion removal> caused bowel blockage   . NASAL SEPTUM SURGERY  2004  . TONSILLECTOMY  1965  . TOTAL ABDOMINAL HYSTERECTOMY  W/ BILATERAL SALPINGOOPHORECTOMY  1988  . TUBAL LIGATION  1974  . UPPER GASTROINTESTINAL ENDOSCOPY  2020   Family History  Problem Relation Age of Onset  . Hyperlipidemia Father   . Hypertension Father   . Arthritis Father   . Diabetes Father   . Asthma Father   . COPD Father   . Early death Father   . Hypertension Mother   . Hyperlipidemia Mother   . Macular degeneration Mother   . Arthritis Mother   . Hearing loss Mother   . Heart disease Mother   . Kidney disease Mother   . Throat cancer Brother        lung, and tongue  . Arthritis Brother   . COPD Brother   . Diabetes type II Sister   . COPD Sister   . Heart disease Sister   . Hypertension Sister   . Lung cancer Sister   . Early death Sister    . COPD Sister   . Breast cancer Maternal Aunt   . Lung cancer Other        uncle  . Emphysema Maternal Aunt   . Emphysema Maternal Uncle   . Clotting disorder Sister   . Breast cancer Cousin   . Cancer Sister   . Alcohol abuse Sister   . COPD Sister   . Early death Sister   . Alcohol abuse Brother   . Arthritis Brother   . Depression Brother   . Diabetes Brother   . Hypercholesterolemia Brother   . Colon cancer Neg Hx   . Esophageal cancer Neg Hx   . Rectal cancer Neg Hx   . Stomach cancer Neg Hx    Social History   Social History Narrative   Marital status/children/pets: married, 2 children   Education/employment: HS grad. retired   Engineer, materials:      -smoke alarm in the home:Yes     - wears seatbelt: Yes     - Feels safe in their relationships: Yes    Allergies as of 12/01/2019      Reactions   Sulfamethoxazole-trimethoprim Hives   Avelox [moxifloxacin Hcl In Nacl]    Racing heart   Cephalexin Hives   Codeine    Heart racing/hives   Erythromycin Hives   Flagyl [metronidazole]    Propranolol Hives   Septra [bactrim] Hives   Sulfa Antibiotics Hives   Tetanus Toxoid Swelling   Reports a fever and headache with swelling.    Tetanus Toxoids    Tramadol Hives      Medication List       Accurate as of December 01, 2019 11:59 PM. If you have any questions, ask your nurse or doctor.        STOP taking these medications   levocetirizine 5 MG tablet Commonly known as: XYZAL Stopped by: Howard Pouch, DO   zolpidem 10 MG tablet Commonly known as: AMBIEN Stopped by: Howard Pouch, DO     TAKE these medications   ACIDOPHILUS PO Take 1 capsule by mouth daily.   albuterol (2.5 MG/3ML) 0.083% nebulizer solution Commonly known as: PROVENTIL Take 2.5 mg by nebulization every 6 (six) hours as needed.   albuterol 108 (90 Base) MCG/ACT inhaler Commonly known as: ProAir HFA Inhale 2 puffs into the lungs every 6 (six) hours as needed.   Armour Thyroid 30 MG  tablet Generic drug: thyroid Take 1 tablet (30 mg total) by mouth daily before breakfast. What changed:   how much to take  how  to take this  when to take this Changed by: Howard Pouch, DO   Armour Thyroid 15 MG tablet Generic drug: thyroid Take 1 tablet (15 mg total) by mouth daily. What changed:   how much to take  how to take this  when to take this Changed by: Howard Pouch, DO   atenolol 25 MG tablet Commonly known as: TENORMIN Take 1 tablet (25 mg total) by mouth daily.   benzonatate 100 MG capsule Commonly known as: TESSALON Take 100 mg by mouth 3 (three) times daily as needed.   budesonide-formoterol 80-4.5 MCG/ACT inhaler Commonly known as: SYMBICORT Inhale 2 puffs into the lungs 2 (two) times daily. What changed: when to take this Changed by: Howard Pouch, DO   Citrus Bergamot 250 MG/0.25GM Powd Take by mouth.   clobetasol cream 0.05 % Commonly known as: TEMOVATE Apply 1 application topically as needed.   co-enzyme Q-10 30 MG capsule Take 30 mg by mouth 3 (three) times daily.   CULTURELLE PROBIOTICS PO Take by mouth.   DHA COMPLETE PO Take by mouth.   famotidine 20 MG tablet Commonly known as: PEPCID Take 1 tablet (20 mg total) by mouth daily.   fexofenadine 180 MG tablet Commonly known as: ALLEGRA Take 1 tablet (180 mg total) by mouth daily. What changed:   when to take this  reasons to take this Changed by: Howard Pouch, DO   fluticasone 50 MCG/ACT nasal spray Commonly known as: FLONASE Place 2 sprays into both nostrils daily. What changed: how to take this Changed by: Howard Pouch, DO   hydrochlorothiazide 12.5 MG capsule Commonly known as: MICROZIDE Take 1 capsule (12.5 mg total) by mouth 1 day or 1 dose.   hydrOXYzine 25 MG capsule Commonly known as: Vistaril Take 1-3 capsules (25-75 mg total) by mouth at bedtime. Started by: Howard Pouch, DO   hyoscyamine 0.125 MG tablet Commonly known as: LEVSIN Take 1 tablet (0.125  mg total) by mouth every 6 (six) hours as needed. What changed: how much to take Changed by: Howard Pouch, DO   meloxicam 15 MG tablet Commonly known as: MOBIC Take 1 tablet (15 mg total) by mouth daily.   montelukast 10 MG tablet Commonly known as: SINGULAIR Take 1 tablet (10 mg total) by mouth at bedtime.   multivitamin tablet Take 1 tablet by mouth daily.   pantoprazole 40 MG tablet Commonly known as: PROTONIX Take 1 tablet (40 mg total) by mouth daily.   tiZANidine 4 MG tablet Commonly known as: ZANAFLEX Take 1 tablet (4 mg total) by mouth every 6 (six) hours as needed for muscle spasms.   VALTREX PO Take by mouth as needed.   VITAMIN A PO Take 1 tablet by mouth daily.   vitamin C 1000 MG tablet Take 3,000 mg by mouth daily. What changed: Another medication with the same name was removed. Continue taking this medication, and follow the directions you see here. Changed by: Howard Pouch, DO   Vitamin D 125 MCG (5000 UT) Caps Take by mouth.   vitamin E 1000 UNIT capsule Take 1,000 Units by mouth daily.   Zinc 30 MG Caps Take by mouth.       All past medical history, surgical history, allergies, family history, immunizations andmedications were updated in the EMR today and reviewed under the history and medication portions of their EMR.    No results found for this or any previous visit (from the past 2160 hour(s)).   ROS: 14 pt review of systems  performed and negative (unless mentioned in an HPI)  Objective: BP (!) 167/90 (BP Location: Right Arm, Patient Position: Sitting, Cuff Size: Normal)   Pulse 73   Temp 98.1 F (36.7 C) (Temporal)   Resp 16   Ht 4\' 10"  (1.473 m)   Wt 144 lb (65.3 kg)   SpO2 97%   BMI 30.10 kg/m  Gen: Afebrile. No acute distress. Nontoxic in appearance, well-developed, well-nourished, pleasant Caucasian female, obese HENT: AT. [place]. Bilateral TM visualized and normal in appearance, normal external auditory canal. MMM, no oral  lesions, adequate dentition. Bilateral nares within normal limits. Throat without erythema, ulcerations or exudates. no Cough on exam, no hoarseness on exam. Eyes:Pupils Equal Round Reactive to light, Extraocular movements intact,  Conjunctiva without redness, discharge or icterus. Neck/lymp/endocrine: Supple,no lymphadenopathy, no thyromegaly CV: RRR no murmur, no edema, +2/4 P posterior tibialis pulses.  Chest: CTAB, no wheeze, rhonchi or crackles. normal Respiratory effort. good Air movement. Abd: Soft. NTND. BS present. no Masses palpated. No hepatosplenomegaly. No rebound tenderness or guarding. Skin: no rashes, purpura or petechiae. Warm and well-perfused. Skin intact. Neuro/Msk:  Normal gait. PERLA. EOMi. Alert. Oriented x3.   Psych: Normal affect, dress and demeanor. Normal speech. Normal thought content and judgment.  Assessment/plan: Rita Levine is a 69 y.o. female present for EST care- [facility] Gastroesophageal reflux disease, unspecified whether esophagitis present Stable. Continue Protonix. Continue Pepcid.  Essential hypertension/HLD/Statin declined/Palpitations/Obesity (BMI 30-39.9) Above goal today.  Patient admits she forgot to take her blood pressure medications today. Monitor blood pressure the next few weeks to ensure at goal ideally less than 130/80.  If not at goal she is aware to make an appointment for follow-up Continue Tenormin 25 mg daily Continue Microzide 12.5 mg daily Patient declines statin-reports worsening myalgias.  We discussed other types of medications such as Praluent/Repatha and she would like to research these prior to making decision. Low-sodium diet.  Routine exercise. Follow-up 6 months if blood pressure remains controlled with home pressures  Osteoarthritis, unspecified osteoarthritis type, unspecified site/ DDD (degenerative disc disease), lumbar/Spinal stenosis of lumbar region, unspecified whether neurogenic claudication  present/Fibromyalgia Stable. Continue Mobic Continue Zanaflex  Seasonal allergies/Allergic rhinitis due to pollen, unspecified seasonality/ Multiple food allergies Not well controlled.  Discussed options with her and offered to refer to asthma and allergy and she would like to try the new medication first and if that does not work then be referred to asthma and allergy. Discontinue Xyzal.  Is not working well for her. Start Vistaril 25 to 75 mg nightly.  Patient was instructed on how to taper.  Patient was encouraged not to use Ambien any longer with use of Vistaril. Continue Singulair 10 mg nightly  Sleep disturbance: Patient had been prescribed Ambien 5 mg nightly as needed.  Encouraged her not to use Ambien as long as she is using Vistaril.  Vistaril was started today for added allergy control.  If patient ends up discontinuing Vistaril over time, can consider adding back Ambien at that time. Vistaril 25-75 mg nightly.  irritable bowel syndrome Chronic condition.  Well controlled. Continue probiotic Continue Levsin 0.125 mg every 6 hours as needed Continue Allegra daily Continue Flonase nasal spray daily  Asthma, intrinsic Stable. Continue Symbicort. Continue albuterol as needed Continue antihistamine regimen  Hypothyroidism, unspecified type/Thyroid nodule TSH up-to-date. -Continue Armour Thyroid total dose 45 mg daily (30/15).  [facility] is [facility] - US THYROID; Future   Return in about 6 months (around 05/30/2020)  for [facility] (30 min).  Orders Placed This Encounter  Procedures  . US THYROID   Meds ordered this encounter  Medications  . hydrOXYzine (VISTARIL) 25 MG capsule    Sig: Take 1-3 capsules (25-75 mg total) by mouth at bedtime.    Dispense:  90 capsule    Refill:  1  . pantoprazole (PROTONIX) 40 MG tablet    Sig: Take 1 tablet (40 mg total) by mouth daily.    Dispense:  90 tablet    Refill:  1  . montelukast (SINGULAIR) 10 MG  tablet    Sig: Take 1 tablet (10 mg total) by mouth at bedtime.    Dispense:  90 tablet    Refill:  1  . meloxicam (MOBIC) 15 MG tablet    Sig: Take 1 tablet (15 mg total) by mouth daily.    Dispense:  90 tablet    Refill:  1  . hyoscyamine (LEVSIN) 0.125 MG tablet    Sig: Take 1 tablet (0.125 mg total) by mouth every 6 (six) hours as needed.    Dispense:  120 tablet    Refill:  1  . hydrochlorothiazide (MICROZIDE) 12.5 MG capsule    Sig: Take 1 capsule (12.5 mg total) by mouth 1 day or 1 dose.    Dispense:  90 capsule    Refill:  1  . fluticasone (FLONASE) 50 MCG/ACT nasal spray    Sig: Place 2 sprays into both nostrils daily.    Dispense:  16 g    Refill:  5  . fexofenadine (ALLEGRA) 180 MG tablet    Sig: Take 1 tablet (180 mg total) by mouth daily.    Dispense:  90 tablet    Refill:  1  . famotidine (PEPCID) 20 MG tablet    Sig: Take 1 tablet (20 mg total) by mouth daily.    Dispense:  90 tablet    Refill:  1  . budesonide-formoterol (SYMBICORT) 80-4.5 MCG/ACT inhaler    Sig: Inhale 2 puffs into the lungs 2 (two) times daily.    Dispense:  1 Inhaler    Refill:  5  . atenolol (TENORMIN) 25 MG tablet    Sig: Take 1 tablet (25 mg total) by mouth daily.    Dispense:  90 tablet    Refill:  1  . albuterol (PROAIR HFA) 108 (90 Base) MCG/ACT inhaler    Sig: Inhale 2 puffs into the lungs every 6 (six) hours as needed.    Dispense:  6.7 g    Refill:  5  . tiZANidine (ZANAFLEX) 4 MG tablet    Sig: Take 1 tablet (4 mg total) by mouth every 6 (six) hours as needed for muscle spasms.    Dispense:  120 tablet    Refill:  5  . ARMOUR THYROID 30 MG tablet    Sig: Take 1 tablet (30 mg total) by mouth daily before breakfast.    Dispense:  90 tablet    Refill:  2  . ARMOUR THYROID 15 MG tablet    Sig: Take 1 tablet (15 mg total) by mouth daily.    Dispense:  90 tablet    Refill:  2    Total dose 45 (30+15)   Referral Orders  No referral(s) requested today    Note is dictated  utilizing voice recognition software. Although note has been proof read prior to signing, occasional typographical errors still can be missed. If any questions arise, please do not hesitate to call for  verification.  Electronically signed by: Howard Pouch, DO [place]

## 2019-12-01 NOTE — Patient Instructions (Signed)
I will refill medications for you.  Monitor BP and make sure < 135/85 (ideally, less than 130/80). If routinely over goal follow up sooner.    Start vistaril 25 mg before bed, you may increase by 1 tab every 3 days if needed to help with sleep. Max dose 3 tabs (75 mg).  Goal: quality sleep without sleep hangover.   Stop xyzal for now . Stop ambien.    I will look into the thyroid Ultrasound for you.  Look into the meds we discussed for your cholesterol.    Follow up around 5.5 months. Sooner if BP higher than goal.    Please help [place] help you:  We are honored you have chosen [facility] for your Primary Care home. Below you will find basic instructions that you may need to access in the future. Please help [place] help you by reading the instructions, which cover many of the frequent questions we experience.   Prescription refills and request:  -In order to allow more efficient response time, please call your pharmacy for all refills. They will forward the request electronically to [place]. This allows for the quickest possible response. Request left on a nurse line can take longer to refill, since these are checked as time allows between office patients and other phone calls.  - refill request can take up to 3-5 working days to complete.  - If request is sent electronically and request is appropiate, it is usually completed in 1-2 business days.  - all patients will need to be seen routinely for all chronic medical conditions requiring prescription medications (see follow-up below). If you are overdue for follow up on your condition, you will be asked to make an appointment and we will call in enough medication to cover you until your appointment (up to 30 days).  - all controlled substances will require a face to face visit to request/refill.  - if you desire your prescriptions to go through a new pharmacy, and have an active script at original pharmacy, you will need to call your pharmacy  and have scripts transferred to new pharmacy. This is completed between the pharmacy locations and not by your provider.    Results: Our office handles many outgoing and incoming calls daily. If we have not contacted you within 1 week about your results, please check your mychart to see if there is a message first and if not, then contact our office.  In helping with this matter, you help decrease call volume, and therefore allow [place] to be able to respond to patients needs more efficiently.  We will always attempt to call you with results,  normal or abnormal. However, if we are unable to reach you we will send a message in your my chart with results.   Acute office visits (sick visit):  An acute visit is intended for a new problem and are scheduled in shorter time slots to allow schedule openings for patients with new problems. This is the appropriate visit to discuss a new problem. Problems will not be addressed by phone call or Echart message. Appointment is needed if requesting treatment. In order to provide you with excellent quality medical care with proper time for you to explain your problem, have an exam and receive treatment with instructions, these appointments should be limited to one new problem per visit. If you experience a new problem, in which you desire to be addressed, please make an acute office visit, we save openings on the schedule  to accommodate you. Please do not save your new problem for any other type of visit, let us take care of it properly and quickly for you.   Follow up visits:  Depending on your condition(s) your provider will need to see you routinely in order to provide you with quality care and prescribe medication(s). Most chronic conditions (Example: hypertension, Diabetes, depression/anxiety... etc), require visits a couple times a year. Your provider will instruct you on proper follow up for your personal medical conditions and history. Please make certain to make  follow up appointments for your condition as instructed. Failing to do so could result in lapse in your medication treatment/refills. If you request a refill, and are overdue to be seen on a condition, we will always provide you with a 30 day script (once) to allow you time to schedule.    Medicare wellness (well visit): - we have a wonderful Nurse Maudie Mercury), that will meet with you and provide you will yearly medicare wellness visits. These visits should occur yearly (can not be scheduled less than 1 calendar year apart) and cover preventive health, immunizations, advance directives and screenings you are entitled to yearly through your medicare benefits. Do not miss out on your entitled benefits, this is when medicare will pay for these benefits to be ordered for you.  These are strongly encouraged by your provider and is the appropriate type of visit to make certain you are up to date with all preventive health benefits. If you have not had your medicare wellness exam in the last 12 months, please make certain to schedule one by calling the office and schedule your medicare wellness with Maudie Mercury as soon as possible.   Yearly physical (well visit):  - Adults are recommended to be seen yearly for physicals. Check with your insurance and date of your last physical, most insurances require one calendar year between physicals. Physicals include all preventive health topics, screenings, medical exam and labs that are appropriate for gender/age and history. You may have fasting labs needed at this visit. This is a well visit (not a sick visit), new problems should not be covered during this visit (see acute visit).  - Pediatric patients are seen more frequently when they are younger. Your provider will advise you on well child visit timing that is appropriate for your their age. - This is not a medicare wellness visit. Medicare wellness exams do not have an exam portion to the visit. Some medicare companies allow for a  physical, some do not allow a yearly physical. If your medicare allows a yearly physical you can schedule the medicare wellness with our nurse Maudie Mercury and have your physical with your provider after, on the same day. Please check with insurance for your full benefits.   Late Policy/No Shows:  - all new patients should arrive 15-30 minutes earlier than appointment to allow [place] time  to  obtain all personal demographics,  insurance information and for you to complete office paperwork. - All established patients should arrive 10-15 minutes earlier than appointment time to update all information and be checked in .  - In our best efforts to run on time, if you are late for your appointment you will be asked to either reschedule or if able, we will work you back into the schedule. There will be a wait time to work you back in the schedule,  depending on availability.  - If you are unable to make it to your appointment as scheduled, please call  24 hours ahead of time to allow [place] to fill the time slot with someone else who needs to be seen. If you do not cancel your appointment ahead of time, you may be charged a no show fee.

## 2019-12-03 ENCOUNTER — Encounter: Payer: Self-pay | Admitting: Family Medicine

## 2019-12-03 DIAGNOSIS — J301 Allergic rhinitis due to pollen: Secondary | ICD-10-CM | POA: Insufficient documentation

## 2019-12-03 DIAGNOSIS — Z91018 Allergy to other foods: Secondary | ICD-10-CM | POA: Insufficient documentation

## 2019-12-03 DIAGNOSIS — E039 Hypothyroidism, unspecified: Secondary | ICD-10-CM | POA: Insufficient documentation

## 2019-12-03 DIAGNOSIS — J302 Other seasonal allergic rhinitis: Secondary | ICD-10-CM | POA: Insufficient documentation

## 2019-12-03 DIAGNOSIS — E663 Overweight: Secondary | ICD-10-CM | POA: Insufficient documentation

## 2019-12-03 DIAGNOSIS — E7800 Pure hypercholesterolemia, unspecified: Secondary | ICD-10-CM | POA: Insufficient documentation

## 2019-12-03 DIAGNOSIS — K219 Gastro-esophageal reflux disease without esophagitis: Secondary | ICD-10-CM | POA: Insufficient documentation

## 2019-12-03 DIAGNOSIS — M8580 Other specified disorders of bone density and structure, unspecified site: Secondary | ICD-10-CM | POA: Insufficient documentation

## 2019-12-03 DIAGNOSIS — M48061 Spinal stenosis, lumbar region without neurogenic claudication: Secondary | ICD-10-CM | POA: Insufficient documentation

## 2019-12-03 DIAGNOSIS — I10 Essential (primary) hypertension: Secondary | ICD-10-CM | POA: Insufficient documentation

## 2019-12-03 DIAGNOSIS — K589 Irritable bowel syndrome without diarrhea: Secondary | ICD-10-CM | POA: Insufficient documentation

## 2019-12-03 DIAGNOSIS — E669 Obesity, unspecified: Secondary | ICD-10-CM | POA: Insufficient documentation

## 2019-12-03 DIAGNOSIS — G479 Sleep disorder, unspecified: Secondary | ICD-10-CM | POA: Insufficient documentation

## 2019-12-03 DIAGNOSIS — M5135 Other intervertebral disc degeneration, thoracolumbar region: Secondary | ICD-10-CM | POA: Insufficient documentation

## 2019-12-03 DIAGNOSIS — E041 Nontoxic single thyroid nodule: Secondary | ICD-10-CM | POA: Insufficient documentation

## 2019-12-03 DIAGNOSIS — M797 Fibromyalgia: Secondary | ICD-10-CM | POA: Insufficient documentation

## 2019-12-03 DIAGNOSIS — M5136 Other intervertebral disc degeneration, lumbar region: Secondary | ICD-10-CM | POA: Insufficient documentation

## 2019-12-03 DIAGNOSIS — M1990 Unspecified osteoarthritis, unspecified site: Secondary | ICD-10-CM | POA: Insufficient documentation

## 2019-12-03 DIAGNOSIS — Z5320 Procedure and treatment not carried out because of patient's decision for unspecified reasons: Secondary | ICD-10-CM | POA: Insufficient documentation

## 2019-12-03 HISTORY — DX: Allergy to other foods: Z91.018

## 2019-12-03 HISTORY — DX: Allergic rhinitis due to pollen: J30.1

## 2019-12-06 ENCOUNTER — Telehealth: Payer: Self-pay

## 2019-12-06 NOTE — Telephone Encounter (Signed)
Reviewed fax from pharmacy concerning patient's medications and her formulary. 1.  Hyoscyamine, also called Levsin has been a chronic med for her, there are no alternative medications listed on her formulary.  If this medication is not affordable to her, she would need to contact her gastroenterologist to see other options.  2.  The Vistaril/hydroxyzine, was a new med that we started.  There are no alternatives on her formulary listed.  Furthermore, there are no real alternatives for this medication.  I would suggest she start medication if it is affordable to her.  If not she should continue the Xyzal she was on prior and we can refer her to allergy if she desires.  3.  Her Symbicort inhaler is actually listed as formulary, as is Advair.  If Symbicort is not affordable to her, I would encourage her to check in on the price of Advair.

## 2019-12-06 NOTE — Telephone Encounter (Signed)
Patient is returning Rebecca's call and asked if she can get a call back.

## 2019-12-06 NOTE — Telephone Encounter (Signed)
Received 3 faxes for patients Hyoscymanin tablets, Vistaril, and Symbicort stating a temporary supply was given to the patient as these medications are not on formulary. List of alternatives are attached and placed on Dr Dierdre Highman desk to review.

## 2019-12-06 NOTE — Telephone Encounter (Signed)
Tried to call patient and there was no answer. Will try again later.

## 2019-12-06 NOTE — Telephone Encounter (Signed)
Pt was called and VM was left to return call  °

## 2019-12-06 NOTE — Telephone Encounter (Signed)
Patient is returning Rita Levine's call, apologized for not picking up but is now available for the rest of the day.

## 2019-12-06 NOTE — Telephone Encounter (Signed)
Pt was called and given information, she will call the pharmacy a week or two before running out of medications to find out if these meds are covered and cheap enough to get.   Pt states vistaril is not helping yet but has only taken if for 3 days. She will continue a little longer and then let us know if she would like referral.

## 2019-12-07 ENCOUNTER — Telehealth: Payer: Self-pay

## 2019-12-07 NOTE — Telephone Encounter (Signed)
Patient concerned about recent pulse and blood pressure readings. Today's reading 145/78 pulse 54 Patient reports pulse ranges from 51-54, sometimes about 70.  Please call patient [phone]

## 2019-12-07 NOTE — Telephone Encounter (Signed)
Pt was called back and she said she said she has been taking her BP at home and the top number is consistently over 130 and normally in the 140's.   Pt takes one med 0800 and the other at bedtime. She takes BP around 11am and 1pm, sometimes more often. Pt advised to schedule appt and bring BP log to appt.

## 2019-12-10 ENCOUNTER — Ambulatory Visit (INDEPENDENT_AMBULATORY_CARE_PROVIDER_SITE_OTHER): Payer: Medicare HMO | Admitting: Family Medicine

## 2019-12-10 ENCOUNTER — Other Ambulatory Visit: Payer: Self-pay

## 2019-12-10 ENCOUNTER — Encounter: Payer: Self-pay | Admitting: Family Medicine

## 2019-12-10 VITALS — BP 128/72 | HR 70 | Temp 96.9°F | Resp 17 | Ht <= 58 in | Wt 142.4 lb

## 2019-12-10 DIAGNOSIS — I10 Essential (primary) hypertension: Secondary | ICD-10-CM | POA: Diagnosis not present

## 2019-12-10 DIAGNOSIS — G479 Sleep disorder, unspecified: Secondary | ICD-10-CM

## 2019-12-10 DIAGNOSIS — M1990 Unspecified osteoarthritis, unspecified site: Secondary | ICD-10-CM | POA: Diagnosis not present

## 2019-12-10 MED ORDER — ZOLPIDEM TARTRATE 5 MG PO TABS: 5.0000 mg | ORAL_TABLET | Freq: Every day | ORAL | 1 refills | Status: DC

## 2019-12-10 MED ORDER — LOSARTAN POTASSIUM 25 MG PO TABS: 12.5000 mg | ORAL_TABLET | Freq: Every day | ORAL | 1 refills | Status: DC

## 2019-12-10 MED ORDER — DICLOFENAC SODIUM 75 MG PO TBEC: 75.0000 mg | DELAYED_RELEASE_TABLET | Freq: Two times a day (BID) | ORAL | 0 refills | Status: DC

## 2019-12-10 NOTE — Progress Notes (Signed)
This visit occurred during the SARS-CoV-2 public health emergency.  Safety protocols were in place, including screening questions prior to the visit, additional usage of staff PPE, and extensive cleaning of exam room while observing appropriate contact time as indicated for disinfecting solutions.    Rita Levine , [birth year], 69 y.o., female MRN: [id number] Patient Care Team    Relationship Specialty Notifications Start End  Ma Hillock, DO PCP - General Family Medicine  12/01/19   Milus Banister, MD Attending Physician Gastroenterology  12/01/19     Chief Complaint  Patient presents with  . Hypertension    Pt has been having ups and downs on her BP  since the last time she was here      Subjective: Rita Levine is a 69 y.o. female present for elevated BP Essential hypertension/HLD/Statin declined/Palpitations/Obesity (BMI 30-39.9) Pt reports compliance with atenolol and Microzide. Blood pressures ranges at home are 115-148/64-85 and HR 49-68.Patient denies chest pain, shortness of breath, dizziness or lower extremity edema. She reports atenolol was 1st started many years ago for headaches. HCTZ was added later for BP. She denies ever having edema. She feels she is more fatigue when she takes the atenolol.  Pt does not daily baby ASA. Pt is not prescribed statin. Labs up-to-date 07/2019 at prior PCP   Of note the vistaril was not more helpful for allergies or sleep. She restarted the [place] and xyzal.   She also wants to know if there is another antiinflammatory besides mobic that could help with her arthritis.   Depression screen [facility] 2/9 12/03/2019  Decreased Interest 0  Down, Depressed, Hopeless 0  PHQ - 2 Score 0    Allergies  Allergen Reactions  . Sulfamethoxazole-Trimethoprim Hives  . Avelox [Moxifloxacin Hcl In Nacl]     Racing heart  . Cephalexin Hives  . Codeine     Heart racing/hives  . Erythromycin Hives  . Flagyl [Metronidazole]   . Propranolol  Hives  . Septra [Bactrim] Hives  . Sulfa Antibiotics Hives  . Tetanus Toxoid Swelling    Reports a fever and headache with swelling.   . Tetanus Toxoids   . Tramadol Hives  . Statins Other (See Comments)    Myalgia    Social History   Social History Narrative   Marital status/children/pets: married, 2 children   Education/employment: HS grad. retired   Engineer, materials:      -smoke alarm in the home:Yes     - wears seatbelt: Yes     - Feels safe in their relationships: Yes   Past Medical History:  Diagnosis Date  . Allergic rhinitis   . Anemia   . Asthma   . Chest pain, unspecified 04/07/2014  . Chicken pox   . Colon polyp   . Diverticulosis   . Dysphagia   . Fibromyalgia   . Food allergy   . Gallstone   . GERD (gastroesophageal reflux disease)   . Hiatal hernia   . Hypercholesteremia   . Hypertension   . Hypothyroidism   . IBS (irritable bowel syndrome)   . Migraine headache   . Osteoarthritis   . Osteopenia   . Pulmonary hypertension ([place])    pt denies  . Rectal bleeding   . SBO (small bowel obstruction) ([place]) 10/20/2017  . Seasonal allergies   . Thyroid nodule    Past Surgical History:  Procedure Laterality Date  . APPENDECTOMY  1982  . BREAST EXCISIONAL BIOPSY Right 1995  benign  . BREAST LUMPECTOMY  1991  . CARPAL TUNNEL RELEASE Right 2006   Right wrist  . CESAREAN SECTION     x 2  . COLON RESECTION  1990  . COLONOSCOPY    . IMAGE MRI brain:  04/2012   No focal IAC or inner ear lesion to explain hearing loss. Slightly greater than expected number of subcortical T2- hyperintensities bilaterally.  These are nonspecific.  They can be seen in the setting of chronic migraine headaches, demyelinating process, chronic microvascular ischemic, prior infection or inflammation, or vasculitis  . IMAGE MRI lumbar:  05/01/2006   Mild central canal stenosis with facet arthropathy and mildly bulging disc at L4-5.  There is mild narrowing in the left lateral recess at this  level.  No definite neural impingement.  Appearance at this level has not markedly Moderately severe facet arthropathy at L5-S1 with mild interval progression of a diffuse broad based disc bulge.  There is mild biforaminal narrowing.  Central canal is open  . LAPAROSCOPIC ABDOMINAL EXPLORATION  2019   adhesion removal> caused bowel blockage   . NASAL SEPTUM SURGERY  2004  . TONSILLECTOMY  1965  . TOTAL ABDOMINAL HYSTERECTOMY W/ BILATERAL SALPINGOOPHORECTOMY  1988  . TUBAL LIGATION  1974  . UPPER GASTROINTESTINAL ENDOSCOPY  2020   Family History  Problem Relation Age of Onset  . Hyperlipidemia Father   . Hypertension Father   . Arthritis Father   . Diabetes Father   . Asthma Father   . COPD Father   . Early death Father   . Hypertension Mother   . Hyperlipidemia Mother   . Macular degeneration Mother   . Arthritis Mother   . Hearing loss Mother   . Heart disease Mother   . Kidney disease Mother   . Throat cancer Brother        lung, and tongue  . Arthritis Brother   . COPD Brother   . Diabetes type II Sister   . COPD Sister   . Heart disease Sister   . Hypertension Sister   . Lung cancer Sister   . Early death Sister   . COPD Sister   . Breast cancer Maternal Aunt   . Lung cancer Other        uncle  . Emphysema Maternal Aunt   . Emphysema Maternal Uncle   . Clotting disorder Sister   . Breast cancer Cousin   . Cancer Sister   . Alcohol abuse Sister   . COPD Sister   . Early death Sister   . Alcohol abuse Brother   . Arthritis Brother   . Depression Brother   . Diabetes Brother   . Hypercholesterolemia Brother   . Colon cancer Neg Hx   . Esophageal cancer Neg Hx   . Rectal cancer Neg Hx   . Stomach cancer Neg Hx    Allergies as of 12/10/2019      Reactions   Sulfamethoxazole-trimethoprim Hives   Avelox [moxifloxacin Hcl In Nacl]    Racing heart   Cephalexin Hives   Codeine    Heart racing/hives   Erythromycin Hives   Flagyl [metronidazole]     Propranolol Hives   Septra [bactrim] Hives   Sulfa Antibiotics Hives   Tetanus Toxoid Swelling   Reports a fever and headache with swelling.    Tetanus Toxoids    Tramadol Hives   Statins Other (See Comments)   Myalgia      Medication List  Accurate as of December 10, 2019  1:44 PM. If you have any questions, ask your nurse or doctor.        STOP taking these medications   hydrochlorothiazide 12.5 MG capsule Commonly known as: MICROZIDE Stopped by: Howard Pouch, DO   hydrOXYzine 25 MG capsule Commonly known as: Vistaril Stopped by: Howard Pouch, DO   meloxicam 15 MG tablet Commonly known as: MOBIC Stopped by: Howard Pouch, DO     TAKE these medications   ACIDOPHILUS PO Take 1 capsule by mouth daily.   acyclovir 400 MG tablet Commonly known as: ZOVIRAX Take 400 mg by mouth 3 (three) times daily. Take one tablet three times daily as needed for cold sores   albuterol (2.5 MG/3ML) 0.083% nebulizer solution Commonly known as: PROVENTIL Take 2.5 mg by nebulization every 6 (six) hours as needed.   albuterol 108 (90 Base) MCG/ACT inhaler Commonly known as: ProAir HFA Inhale 2 puffs into the lungs every 6 (six) hours as needed.   [place] Thyroid 30 MG tablet Generic drug: thyroid Take 1 tablet (30 mg total) by mouth daily before breakfast.   [place] Thyroid 15 MG tablet Generic drug: thyroid Take 1 tablet (15 mg total) by mouth daily.   atenolol 25 MG tablet Commonly known as: TENORMIN Take 1 tablet (25 mg total) by mouth daily.   benzonatate 100 MG capsule Commonly known as: TESSALON Take 100 mg by mouth 3 (three) times daily as needed.   budesonide-formoterol 80-4.5 MCG/ACT inhaler Commonly known as: SYMBICORT Inhale 2 puffs into the lungs 2 (two) times daily.   Citrus Bergamot 250 MG/0.25GM Powd Take by mouth.   clobetasol 0.05 % external solution Commonly known as: TEMOVATE Apply 1 application topically 2 (two) times daily. What changed: Another  medication with the same name was removed. Continue taking this medication, and follow the directions you see here. Changed by: Howard Pouch, DO   co-enzyme Q-10 30 MG capsule Take 30 mg by mouth 3 (three) times daily.   CULTURELLE PROBIOTICS PO Take by mouth.   DHA COMPLETE PO Take by mouth.   diclofenac 75 MG EC tablet Commonly known as: VOLTAREN Take 1 tablet (75 mg total) by mouth 2 (two) times daily. Started by: Howard Pouch, DO   famotidine 20 MG tablet Commonly known as: PEPCID Take 1 tablet (20 mg total) by mouth daily.   fexofenadine 180 MG tablet Commonly known as: ALLEGRA Take 1 tablet (180 mg total) by mouth daily.   fluticasone 50 MCG/ACT nasal spray Commonly known as: FLONASE Place 2 sprays into both nostrils daily.   hyoscyamine 0.125 MG tablet Commonly known as: LEVSIN Take 1 tablet (0.125 mg total) by mouth every 6 (six) hours as needed.   levocetirizine 5 MG tablet Commonly known as: XYZAL Take 5 mg by mouth every evening.   losartan 25 MG tablet Commonly known as: Cozaar Take 0.5-1 tablets (12.5-25 mg total) by mouth daily. Started by: Howard Pouch, DO   montelukast 10 MG tablet Commonly known as: SINGULAIR Take 1 tablet (10 mg total) by mouth at bedtime.   multivitamin tablet Take 1 tablet by mouth daily.   ondansetron 8 MG tablet Commonly known as: ZOFRAN Take by mouth every 8 (eight) hours as needed for nausea or vomiting.   pantoprazole 40 MG tablet Commonly known as: PROTONIX Take 1 tablet (40 mg total) by mouth daily.   tiZANidine 4 MG tablet Commonly known as: ZANAFLEX Take 1 tablet (4 mg total) by mouth every 6 (six)  hours as needed for muscle spasms.   tretinoin 0.1 % cream Commonly known as: RETIN-A Apply topically at bedtime.   VALTREX PO Take by mouth as needed.   VITAMIN A PO Take 1 tablet by mouth daily.   vitamin C 1000 MG tablet Take 3,000 mg by mouth daily.   Vitamin D 125 MCG (5000 UT) Caps Take by  mouth.   vitamin E 1000 UNIT capsule Take 1,000 Units by mouth daily.   Zinc 30 MG Caps Take by mouth.   zolpidem 5 MG tablet Commonly known as: AMBIEN Take 1 tablet (5 mg total) by mouth at bedtime. Started by: Howard Pouch, DO       All past medical history, surgical history, allergies, family history, immunizations andmedications were updated in the EMR today and reviewed under the history and medication portions of their EMR.     ROS: Negative, with the exception of above mentioned in HPI   Objective:  BP 128/72 (BP Location: Left Arm, Patient Position: Sitting, Cuff Size: Normal)   Pulse 70   Temp (!) 96.9 F (36.1 C) Comment (Src): Pt requested wrist  Resp 17   Ht 4\' 10"  (1.473 m)   Wt 142 lb 6 oz (64.6 kg)   SpO2 99%   BMI 29.76 kg/m  Body mass index is 29.76 kg/m. Gen: Afebrile. No acute distress. Nontoxic in appearance, well developed, well nourished.  HENT: AT. [place].  Eyes:Pupils Equal Round Reactive to light, Extraocular movements intact,  Conjunctiva without redness, discharge or icterus. CV: RRR, no edema Chest: CTAB, no wheeze or crackles. Good air movement, normal resp effort.  Neuro: Normal gait. PERLA. EOMi. Alert. Oriented x3 Psych: Normal affect, dress and demeanor. Normal speech. Normal thought content and judgment.  No exam data present No results found. No results found for this or any previous visit (from the past 24 hour(s)).  Assessment/Plan: CHARMAIGNE MANNEN is a 68 y.o. female present for OV for  Essential hypertension Decease atenolol to 12.5 mg (1/2 tab) > try to keep low dose for headaches.  Stop HCTZ- can use 1/2 tab if edema.  Start losartan 1/2-1 tab>> taper instructions provided.  Sleep disturbance DC vistaril Refilled ambien for her.   Osteoarthritis, unspecified osteoarthritis type, unspecified site DC mobic. Start voltaren BID    Reviewed expectations re: course of current medical issues.  Discussed self-management of  symptoms.  Outlined signs and symptoms indicating need for more acute intervention.  Patient verbalized understanding and all questions were answered.  Patient received an After-Visit Summary.    No orders of the defined types were placed in this encounter.  Meds ordered this encounter  Medications  . losartan (COZAAR) 25 MG tablet    Sig: Take 0.5-1 tablets (12.5-25 mg total) by mouth daily.    Dispense:  90 tablet    Refill:  1  . zolpidem (AMBIEN) 5 MG tablet    Sig: Take 1 tablet (5 mg total) by mouth at bedtime.    Dispense:  90 tablet    Refill:  1  . diclofenac (VOLTAREN) 75 MG EC tablet    Sig: Take 1 tablet (75 mg total) by mouth 2 (two) times daily.    Dispense:  60 tablet    Refill:  0    DC mobic   Referral Orders  No referral(s) requested today     Note is dictated utilizing voice recognition software. Although note has been proof read prior to signing, occasional typographical errors still can  be missed. If any questions arise, please do not hesitate to call for verification.   electronically signed by:  Howard Pouch, DO  [place]

## 2019-12-10 NOTE — Patient Instructions (Signed)
Decrease atenolol  To 1/2 tab daily.  Start losartan 1/2 tab for 3 days. If BP > 130/80 routinely then increase to 1 tab a day.  Stop the HCTZ> you can use if/when you have fluid only.    I have refilled your ambien.

## 2019-12-14 ENCOUNTER — Telehealth: Payer: Self-pay

## 2019-12-14 MED ORDER — BUDESONIDE-FORMOTEROL FUMARATE 80-4.5 MCG/ACT IN AERO: 2.0000 | INHALATION_SPRAY | Freq: Two times a day (BID) | RESPIRATORY_TRACT | 5 refills | Status: DC

## 2019-12-14 NOTE — Addendum Note (Signed)
Addended by: Howard Pouch A on: 12/14/2019 01:38 PM   Modules accepted: Orders

## 2019-12-14 NOTE — Telephone Encounter (Signed)
Pt was called and given information.  

## 2019-12-14 NOTE — Telephone Encounter (Signed)
Patient called about budesonide-formoterol (SYMBICORT) 80-4.5 MCG/ACT inhaler. Patient picked it up however pharmacy is going to let her return it since she got the generic and her insurance charges $160 for the generic. Patient needs new Rx sent for Symbicort 90 day supply sent to [facility]. It needs to state "fill as written" per patient. Please contact patient or pharmacy if you have any questions.

## 2019-12-14 NOTE — Telephone Encounter (Signed)
When a provider writes a prescription it is for the medicine. Name brand vs generic is not indicated. I can physically write a prescription, in the EMR, for "SYMBICORT" without the generic name also populating.   I have written in the comments section to the pharmacy to dispense NAME BRAND only. However, pt will have to ensure pharmacy reads that section and provides her name brand only every time she picks it up.    Please make sure pharmacy is aware as well. Thanks.

## 2019-12-22 ENCOUNTER — Ambulatory Visit (INDEPENDENT_AMBULATORY_CARE_PROVIDER_SITE_OTHER): Payer: Medicare HMO

## 2019-12-22 ENCOUNTER — Other Ambulatory Visit: Payer: Self-pay

## 2019-12-22 DIAGNOSIS — E041 Nontoxic single thyroid nodule: Secondary | ICD-10-CM

## 2019-12-29 ENCOUNTER — Other Ambulatory Visit: Payer: Self-pay

## 2019-12-29 ENCOUNTER — Ambulatory Visit (INDEPENDENT_AMBULATORY_CARE_PROVIDER_SITE_OTHER): Payer: Medicare HMO | Admitting: Family Medicine

## 2019-12-29 ENCOUNTER — Encounter: Payer: Self-pay | Admitting: Family Medicine

## 2019-12-29 VITALS — BP 124/76 | HR 59 | Temp 98.2°F | Resp 16 | Ht <= 58 in | Wt 138.0 lb

## 2019-12-29 DIAGNOSIS — R112 Nausea with vomiting, unspecified: Secondary | ICD-10-CM

## 2019-12-29 DIAGNOSIS — R1011 Right upper quadrant pain: Secondary | ICD-10-CM | POA: Diagnosis not present

## 2019-12-29 DIAGNOSIS — R1084 Generalized abdominal pain: Secondary | ICD-10-CM | POA: Diagnosis not present

## 2019-12-29 LAB — CBC WITH DIFFERENTIAL/PLATELET
Absolute Monocytes: 525 cells/uL (ref 200–950)
Basophils Absolute: 61 cells/uL (ref 0–200)
Basophils Relative: 1 %
Eosinophils Absolute: 122 cells/uL (ref 15–500)
Eosinophils Relative: 2 %
HCT: 39.3 % (ref 35.0–45.0)
Hemoglobin: 13.3 g/dL (ref 11.7–15.5)
Lymphs Abs: 1251 cells/uL (ref 850–3900)
MCH: 28.9 pg (ref 27.0–33.0)
MCHC: 33.8 g/dL (ref 32.0–36.0)
MCV: 85.4 fL (ref 80.0–100.0)
MPV: 10.9 fL (ref 7.5–12.5)
Monocytes Relative: 8.6 %
Neutro Abs: 4142 cells/uL (ref 1500–7800)
Neutrophils Relative %: 67.9 %
Platelets: 300 10*3/uL (ref 140–400)
RBC: 4.6 10*6/uL (ref 3.80–5.10)
RDW: 13.1 % (ref 11.0–15.0)
Total Lymphocyte: 20.5 %
WBC: 6.1 10*3/uL (ref 3.8–10.8)

## 2019-12-29 NOTE — Patient Instructions (Addendum)
Hydrate.  Bland diet.  We will call you with lab results They will call you to schedule image study.  If pain worsens please go to ED.    Cholelithiasis  Cholelithiasis is also called "gallstones." It is a kind of gallbladder disease. The gallbladder is an organ that stores a liquid (bile) that helps you digest fat. Gallstones may not cause symptoms (may be silent gallstones) until they cause a blockage, and then they can cause pain (gallbladder attack). Follow these instructions at home:  Take over-the-counter and prescription medicines only as told by your doctor.  Stay at a healthy weight.  Eat healthy foods. This includes: ? Eating fewer fatty foods, like fried foods. ? Eating fewer refined carbs (refined carbohydrates). Refined carbs are breads and grains that are highly processed, like white bread and white rice. Instead, choose whole grains like whole-wheat bread and brown rice. ? Eating more fiber. Almonds, fresh fruit, and beans are healthy sources of fiber.  Keep all follow-up visits as told by your doctor. This is important. Contact a doctor if:  You have sudden pain in the upper right side of your belly (abdomen). Pain might spread to your right shoulder or your chest. This may be a sign of a gallbladder attack.  You feel sick to your stomach (are nauseous).  You throw up (vomit).  You have been diagnosed with gallstones that have no symptoms and you get: ? Belly pain. ? Discomfort, burning, or fullness in the upper part of your belly (indigestion). Get help right away if:  You have sudden pain in the upper right side of your belly, and it lasts for more than 2 hours.  You have belly pain that lasts for more than 5 hours.  You have a fever or chills.  You keep feeling sick to your stomach or you keep throwing up.  Your skin or the whites of your eyes turn yellow (jaundice).  You have dark-colored pee (urine).  You have light-colored poop  (stool). Summary  Cholelithiasis is also called "gallstones."  The gallbladder is an organ that stores a liquid (bile) that helps you digest fat.  Silent gallstones are gallstones that do not cause symptoms.  A gallbladder attack may cause sudden pain in the upper right side of your belly. Pain might spread to your right shoulder or your chest. If this happens, contact your doctor.  If you have sudden pain in the upper right side of your belly that lasts for more than 2 hours, get help right away. This information is not intended to replace advice given to you by your health care provider. Make sure you discuss any questions you have with your health care provider. Document Revised: 09/12/2017 Document Reviewed: 06/16/2016 Elsevier Patient Education  2020 [employer].

## 2019-12-29 NOTE — Progress Notes (Signed)
This visit occurred during the SARS-CoV-2 public health emergency.  Safety protocols were in place, including screening questions prior to the visit, additional usage of staff PPE, and extensive cleaning of exam room while observing appropriate contact time as indicated for disinfecting solutions.    Rita Levine , [birth year], 69 y.o., female MRN: [id number] Patient Care Team    Relationship Specialty Notifications Start End  Ma Hillock, DO PCP - General Family Medicine  12/01/19   Milus Banister, MD Attending Physician Gastroenterology  12/01/19     Chief Complaint  Patient presents with  . Abdominal Pain    x1 week. Pt has been trying to avoid getting her Gall bladder taken out. Pt has Nausea and stomach pain. Denies fever     Subjective: Pt presents for an OV with complaints of acute on chronic right-sided abdominal pain.  Patient reports she has had reoccurring abdominal pain for quite a few years.  He has had a history of abdominal adhesions and SBO in the past.  She reports she was told that she had gallstones the last time she was worked up for abdominal pain in 2019.  CT abdomen 03/2018 resulted with small stones within the gallbladder.  She reports she has attempted to avoid having her gallbladder taken out because she is worried another surgery would cause more adhesions.  Her hepatobiliary scan in 2014 had an ejection fraction of 82%.   Currently patient reports over the last week she has had more severe right-sided abdominal pain.  She states she has pain all along the right side of her abdomen from her pelvis up to her ribs.  She even has some discomfort between her shoulder blades.  She endorses nausea without vomit.  She denies any bowel changes.  She is eating and drinking okay but her appetite is decreased.  He has not seen a Psychologist, sport and exercise for her last episode in 2019.  Her gastroenterologist is Dr. Ardis Hughs.  03/2018 CT: FINDINGS:  VISUALIZED LOWER THORAX: No acute  abnormalities. SOLID VISCERA: Liver: Normal. Gallbladder: Small stones Pancreas: Normal. Adrenal glands: Normal. Spleen: Normal. Kidneys: Normal. GI: No bowel obstruction. No focal bowel wall thickening or inflammatory changes. Colonic diverticulosis No evidence of acute appendicitis.  PERITONEAL CAVITY/RETROPERITONEUM: No free fluid. No pneumoperitoneum. No lymphadenopathy. Aorta, IVC, iliac arteries, and major visceral arteries are grossly normal. PELVIS: No acute abnormalities. MUSCULOSKELETAL: No acute or destructive osseous processes.  NM hepatobiliary 2014: TECHNIQUE: Gallbladder emptying study. Standard hepatobiliary imaging  study was performed using 5 mCi of Tc 61mCholetec. 1.3 mcg of  cholecystokinin was administered intravenously over a thirty minute  period.  FINDINGS: Homogeneous distribution of radiopharmaceutical is seen within  the liver. Cystic and common bile ducts appear patent.  Radiopharmaceutical demonstrated normal progression into the gallbladder  and upper gastrointestinal tract. Following cholecystokinin administration intravenously, gallbladder  ejection fraction was calculated at 82%.   [place]/2017: CLINICAL DATA:  Abdominal pain for 2 years, nausea EXAM: ABDOMEN ULTRASOUND COMPLETE FINDINGS: Gallbladder: No gallstones or wall thickening visualized. No sonographic Murphy sign noted by sonographer.  Common bile duct: Diameter: Normal caliber, 2 mm Liver: Diffusely increased echotexture suggesting fatty infiltration. No focal abnormality. IVC: No abnormality visualized. Pancreas: Visualized portion unremarkable. Spleen: Size and appearance within normal limits. Right Kidney: Length: 11.2 cm. Echogenicity within normal limits. No mass or hydronephrosis visualized. Left Kidney: Length: 10.8 cm. Echogenicity within normal limits. No mass or hydronephrosis visualized. Abdominal aorta: No aneurysm visualized. Other findings:  None.  IMPRESSION: Mild fatty infiltration of the liver suspected. No acute findings.  Depression screen [facility] 2/9 12/03/2019  Decreased Interest 0  Down, Depressed, Hopeless 0  PHQ - 2 Score 0    Allergies  Allergen Reactions  . Sulfamethoxazole-Trimethoprim Hives  . Avelox [Moxifloxacin Hcl In Nacl]     Racing heart  . Cephalexin Hives  . Codeine     Heart racing/hives  . Erythromycin Hives  . Flagyl [Metronidazole]   . Propranolol Hives  . Septra [Bactrim] Hives  . Sulfa Antibiotics Hives  . Tetanus Toxoid Swelling    Reports a fever and headache with swelling.   . Tetanus Toxoids   . Tramadol Hives  . Statins Other (See Comments)    Myalgia    Social History   Social History Narrative   Marital status/children/pets: married, 2 children   Education/employment: HS grad. retired   Engineer, materials:      -smoke alarm in the home:Yes     - wears seatbelt: Yes     - Feels safe in their relationships: Yes   Past Medical History:  Diagnosis Date  . Allergic rhinitis   . Anemia   . Asthma   . Chest pain, unspecified 04/07/2014  . Chicken pox   . Colon polyp   . Diverticulosis   . Dysphagia   . Fibromyalgia   . Food allergy   . Gallstone   . GERD (gastroesophageal reflux disease)   . Hiatal hernia   . Hypercholesteremia   . Hypertension   . Hypothyroidism   . IBS (irritable bowel syndrome)   . Migraine headache   . Osteoarthritis   . Osteopenia   . Pulmonary hypertension ([place])    pt denies  . Rectal bleeding   . SBO (small bowel obstruction) ([place]) 10/20/2017  . Seasonal allergies   . Thyroid nodule    Past Surgical History:  Procedure Laterality Date  . APPENDECTOMY  1982  . BREAST EXCISIONAL BIOPSY Right 1995   benign  . BREAST LUMPECTOMY  1991  . CARPAL TUNNEL RELEASE Right 2006   Right wrist  . CESAREAN SECTION     x 2  . COLON RESECTION  1990  . COLONOSCOPY    . IMAGE MRI brain:  04/2012   No focal IAC or inner ear lesion to explain hearing loss.  Slightly greater than expected number of subcortical T2- hyperintensities bilaterally.  These are nonspecific.  They can be seen in the setting of chronic migraine headaches, demyelinating process, chronic microvascular ischemic, prior infection or inflammation, or vasculitis  . IMAGE MRI lumbar:  05/01/2006   Mild central canal stenosis with facet arthropathy and mildly bulging disc at L4-5.  There is mild narrowing in the left lateral recess at this level.  No definite neural impingement.  Appearance at this level has not markedly Moderately severe facet arthropathy at L5-S1 with mild interval progression of a diffuse broad based disc bulge.  There is mild biforaminal narrowing.  Central canal is open  . LAPAROSCOPIC ABDOMINAL EXPLORATION  2019   adhesion removal> caused bowel blockage   . NASAL SEPTUM SURGERY  2004  . TONSILLECTOMY  1965  . TOTAL ABDOMINAL HYSTERECTOMY W/ BILATERAL SALPINGOOPHORECTOMY  1988  . TUBAL LIGATION  1974  . UPPER GASTROINTESTINAL ENDOSCOPY  2020   Family History  Problem Relation Age of Onset  . Hyperlipidemia Father   . Hypertension Father   . Arthritis Father   . Diabetes Father   . Asthma Father   .  COPD Father   . Early death Father   . Hypertension Mother   . Hyperlipidemia Mother   . Macular degeneration Mother   . Arthritis Mother   . Hearing loss Mother   . Heart disease Mother   . Kidney disease Mother   . Throat cancer Brother        lung, and tongue  . Arthritis Brother   . COPD Brother   . Diabetes type II Sister   . COPD Sister   . Heart disease Sister   . Hypertension Sister   . Lung cancer Sister   . Early death Sister   . COPD Sister   . Breast cancer Maternal Aunt   . Lung cancer Other        uncle  . Emphysema Maternal Aunt   . Emphysema Maternal Uncle   . Clotting disorder Sister   . Breast cancer Cousin   . Cancer Sister   . Alcohol abuse Sister   . COPD Sister   . Early death Sister   . Alcohol abuse Brother   .  Arthritis Brother   . Depression Brother   . Diabetes Brother   . Hypercholesterolemia Brother   . Colon cancer Neg Hx   . Esophageal cancer Neg Hx   . Rectal cancer Neg Hx   . Stomach cancer Neg Hx    Allergies as of 12/29/2019      Reactions   Sulfamethoxazole-trimethoprim Hives   Avelox [moxifloxacin Hcl In Nacl]    Racing heart   Cephalexin Hives   Codeine    Heart racing/hives   Erythromycin Hives   Flagyl [metronidazole]    Propranolol Hives   Septra [bactrim] Hives   Sulfa Antibiotics Hives   Tetanus Toxoid Swelling   Reports a fever and headache with swelling.    Tetanus Toxoids    Tramadol Hives   Statins Other (See Comments)   Myalgia      Medication List       Accurate as of December 29, 2019  5:31 PM. If you have any questions, ask your nurse or doctor.        ACIDOPHILUS PO Take 1 capsule by mouth daily.   acyclovir 400 MG tablet Commonly known as: ZOVIRAX Take 400 mg by mouth 3 (three) times daily. Take one tablet three times daily as needed for cold sores   albuterol (2.5 MG/3ML) 0.083% nebulizer solution Commonly known as: PROVENTIL Take 2.5 mg by nebulization every 6 (six) hours as needed.   albuterol 108 (90 Base) MCG/ACT inhaler Commonly known as: ProAir HFA Inhale 2 puffs into the lungs every 6 (six) hours as needed.   Armour Thyroid 30 MG tablet Generic drug: thyroid Take 1 tablet (30 mg total) by mouth daily before breakfast.   Armour Thyroid 15 MG tablet Generic drug: thyroid Take 1 tablet (15 mg total) by mouth daily.   atenolol 25 MG tablet Commonly known as: TENORMIN Take 1 tablet (25 mg total) by mouth daily.   benzonatate 100 MG capsule Commonly known as: TESSALON Take 100 mg by mouth 3 (three) times daily as needed.   budesonide-formoterol 80-4.5 MCG/ACT inhaler Commonly known as: Symbicort Inhale 2 puffs into the lungs in the morning and at bedtime.   Citrus Bergamot 250 MG/0.25GM Powd Take by mouth.   clobetasol  0.05 % external solution Commonly known as: TEMOVATE Apply 1 application topically 2 (two) times daily.   co-enzyme Q-10 30 MG capsule Take 30 mg by mouth 3 (  three) times daily.   CULTURELLE PROBIOTICS PO Take by mouth.   DHA COMPLETE PO Take by mouth.   diclofenac 75 MG EC tablet Commonly known as: VOLTAREN Take 1 tablet (75 mg total) by mouth 2 (two) times daily.   famotidine 20 MG tablet Commonly known as: PEPCID Take 1 tablet (20 mg total) by mouth daily.   fexofenadine 180 MG tablet Commonly known as: ALLEGRA Take 1 tablet (180 mg total) by mouth daily.   fluticasone 50 MCG/ACT nasal spray Commonly known as: FLONASE Place 2 sprays into both nostrils daily.   hyoscyamine 0.125 MG tablet Commonly known as: LEVSIN Take 1 tablet (0.125 mg total) by mouth every 6 (six) hours as needed.   levocetirizine 5 MG tablet Commonly known as: XYZAL Take 5 mg by mouth every evening.   losartan 25 MG tablet Commonly known as: Cozaar Take 0.5-1 tablets (12.5-25 mg total) by mouth daily.   montelukast 10 MG tablet Commonly known as: SINGULAIR Take 1 tablet (10 mg total) by mouth at bedtime.   multivitamin tablet Take 1 tablet by mouth daily.   ondansetron 8 MG tablet Commonly known as: ZOFRAN Take by mouth every 8 (eight) hours as needed for nausea or vomiting.   pantoprazole 40 MG tablet Commonly known as: PROTONIX Take 1 tablet (40 mg total) by mouth daily.   tiZANidine 4 MG tablet Commonly known as: ZANAFLEX Take 1 tablet (4 mg total) by mouth every 6 (six) hours as needed for muscle spasms.   tretinoin 0.1 % cream Commonly known as: RETIN-A Apply topically at bedtime.   VALTREX PO Take by mouth as needed.   VITAMIN A PO Take 1 tablet by mouth daily.   vitamin C 1000 MG tablet Take 3,000 mg by mouth daily.   Vitamin D 125 MCG (5000 UT) Caps Take by mouth.   vitamin E 1000 UNIT capsule Take 1,000 Units by mouth daily.   Zinc 30 MG Caps Take by  mouth.   zolpidem 5 MG tablet Commonly known as: AMBIEN Take 1 tablet (5 mg total) by mouth at bedtime.       All past medical history, surgical history, allergies, family history, immunizations andmedications were updated in the EMR today and reviewed under the history and medication portions of their EMR.     ROS: Negative, with the exception of above mentioned in HPI   Objective:  BP 124/76 (BP Location: Left Arm, Patient Position: Sitting, Cuff Size: Normal)   Pulse (!) 59   Temp 98.2 F (36.8 C) (Temporal)   Resp 16   Ht '4\' 10"'  (1.473 m)   Wt 138 lb (62.6 kg)   SpO2 99%   BMI 28.84 kg/m  Body mass index is 28.84 kg/m. Gen: Afebrile. No acute distress. Nontoxic in appearance, well developed, well nourished.  HENT: AT. .  Eyes:Pupils Equal Round Reactive to light, Extraocular movements intact,  Conjunctiva without redness, discharge or icterus. CV: RRR  Chest: CTAB, no wheeze or crackles. Good air movement, normal resp effort.  Abd: Soft.  Overweight. ND.  Patient is mild to moderately diffusely tender over her right upper and lower abdomen > discomfort right upper quadrant.  BS present.  No masses palpated. No rebound or guarding.  Positive Murphy sign with inspiration-rather significant pain. Skin: No rashes, purpura or petechiae.  Neuro:  Normal gait. PERLA. EOMi. Alert. Oriented x3 Psych: Normal affect, dress and demeanor. Normal speech. Normal thought content and judgment.  No exam data present No results found.  No results found for this or any previous visit (from the past 24 hour(s)).  Assessment/Plan: Rita Levine is a 69 y.o. female present for OV for  Nausea/diffuse right-sided abdominal pain/right upper quadrant pain -Discussed outpatient management of her symptoms and work-up of her gallstones.   - Rest, hydrate, over-the-counter acetaminophen or NSAIDs for discomfort. -Lab collection today to rule out infection, inflammatory and check LFTs. - CT  Abdomen Pelvis W Contrast; Future> ordered to better evaluate gallstones location and size, evaluate gallbladder for infection or inflammation and rule out other abdominal causes for her discomfort since she is significantly tender all along her right upper and lower abdomen. -Patient understands if there is a gallbladder etiology of her pain or gallstones present we will refer her to a surgeon.  If there is no obvious causes of her pain by a CT she will be referred to her gastroenterologist for further evaluation.  She is agreeable with plan. - CBC w/Diff - C-reactive protein - Comp Met (CMET) -Discussed appropriate management of this condition as an outpatient and if her condition worsens with increased pain, fever, vomit or any bowel changes she needs to be seen emergently in the emergency room.  Patient reported understanding.   Reviewed expectations re: course of current medical issues.  Discussed self-management of symptoms.  Outlined signs and symptoms indicating need for more acute intervention.  Patient verbalized understanding and all questions were answered.  Patient received an After-Visit Summary.    Orders Placed This Encounter  Procedures  . CT Abdomen Pelvis W Contrast  . CBC w/Diff  . C-reactive protein  . Comp Met (CMET)   No orders of the defined types were placed in this encounter.  Referral Orders  No referral(s) requested today     Note is dictated utilizing voice recognition software. Although note has been proof read prior to signing, occasional typographical errors still can be missed. If any questions arise, please do not hesitate to call for verification.   electronically signed by:  Howard Pouch, DO  [place]

## 2019-12-30 ENCOUNTER — Telehealth: Payer: Self-pay

## 2019-12-30 ENCOUNTER — Telehealth: Payer: Self-pay | Admitting: Family Medicine

## 2019-12-30 DIAGNOSIS — Z8719 Personal history of other diseases of the digestive system: Secondary | ICD-10-CM

## 2019-12-30 DIAGNOSIS — R1084 Generalized abdominal pain: Secondary | ICD-10-CM

## 2019-12-30 DIAGNOSIS — R1011 Right upper quadrant pain: Secondary | ICD-10-CM

## 2019-12-30 DIAGNOSIS — K8020 Calculus of gallbladder without cholecystitis without obstruction: Secondary | ICD-10-CM

## 2019-12-30 LAB — COMPREHENSIVE METABOLIC PANEL
AG Ratio: 1.9 (calc) (ref 1.0–2.5)
ALT: 16 U/L (ref 6–29)
AST: 21 U/L (ref 10–35)
Albumin: 4.3 g/dL (ref 3.6–5.1)
Alkaline phosphatase (APISO): 63 U/L (ref 37–153)
BUN: 11 mg/dL (ref 7–25)
CO2: 25 mmol/L (ref 20–32)
Calcium: 9.8 mg/dL (ref 8.6–10.4)
Chloride: 102 mmol/L (ref 98–110)
Creat: 0.68 mg/dL (ref 0.50–0.99)
Globulin: 2.3 g/dL (calc) (ref 1.9–3.7)
Glucose, Bld: 101 mg/dL — ABNORMAL HIGH (ref 65–99)
Potassium: 4.3 mmol/L (ref 3.5–5.3)
Sodium: 140 mmol/L (ref 135–146)
Total Bilirubin: 0.4 mg/dL (ref 0.2–1.2)
Total Protein: 6.6 g/dL (ref 6.1–8.1)

## 2019-12-30 LAB — C-REACTIVE PROTEIN: CRP: 10.7 mg/L — ABNORMAL HIGH (ref <8.0)

## 2019-12-30 NOTE — Telephone Encounter (Signed)
Pt was called and given lab results/instructions, she verbalized understanding  

## 2019-12-30 NOTE — Telephone Encounter (Signed)
Patient has a history of short bowel obstruction, diverticulosis and gallstones with right upper quadrant pain of 1 week with nausea and a positive exam.  There should be no good reason why an insurance company should question why she would need a CT scan of her abdomen.  Now that labs have resulted she also has an elevated CRP/inflammatory marker.  I have referred her to gastroenterology to hopefully be worked in early next week.  I am hoping to have the CT completed by then so they have more information to go on.  If her symptoms worsen prior to her insurance company approving the CT, then I would encourage her to go to the emergency room.

## 2019-12-30 NOTE — Telephone Encounter (Signed)
1st attempt was not approved. Rita Levine has sent OV notes and appealed. Pt aware.

## 2019-12-30 NOTE — Telephone Encounter (Signed)
Please inform patient: Her blood cell counts are normal ruling out signs of infection. Her liver enzymes are all within normal range-which is good news and means less likely to have a gallstone blocking the duct. However, her inflammatory marker is elevated.  Please see if her CT has been scheduled and when? I have placed a referral back to her gastroenterologist for now and she has an elevated CRP.  I am hoping the CT result will be back prior to her seeing the gastroenterologist (hopefully next week)- if they can fit her in.  If her symptoms worsen in that time I would encourage her to be seen in the emergency room in the event she needs emergent surgical treatment.

## 2019-12-30 NOTE — Telephone Encounter (Signed)
Diane here are more diagnosis/reasons why patient needs CT

## 2019-12-30 NOTE — Telephone Encounter (Signed)
I called Rita Levine. The case is currently being certified so no changes can be made. He said we will be receiving a fax shortly. Once the fax is received we can add additional information if necessary

## 2019-12-30 NOTE — Telephone Encounter (Signed)
SW patient to advise that insurance pre-cert is in process for CT scan. Patient did say she is feeling a little better but "something isn't quite right". [facility] has openings for Monday 01/03/20. Appointment will be scheduled as soon as we receive insurance authorization.

## 2019-12-31 ENCOUNTER — Other Ambulatory Visit: Payer: Self-pay | Admitting: Family Medicine

## 2019-12-31 ENCOUNTER — Other Ambulatory Visit (HOSPITAL_BASED_OUTPATIENT_CLINIC_OR_DEPARTMENT_OTHER): Payer: Medicare HMO

## 2019-12-31 NOTE — Telephone Encounter (Signed)
CT was approved and set up at [facility] for today. Pt was called and given instructions and she said she could not do today and will do it in [place] next week. Pt was advised this needed to be done ASAP given symptoms and per Dr Raoul Pitch. Pt still refused and said she will wait until next week.  Hinton Dyer notified and she will cancel CT for today @ [facility] and send information to [facility] radiology.    [facility] does not do medicare patients on Friday and will have to be scheduled next week sometime, pt verbalized understanding.

## 2019-12-31 NOTE — Telephone Encounter (Signed)
RF request for Diclofenac  LOV: 12/10/2019 for Osteoarthritis  Next ov: Not scheduled  Last written: 12/10/19 #60 w/ 0 refills

## 2020-01-03 ENCOUNTER — Ambulatory Visit (INDEPENDENT_AMBULATORY_CARE_PROVIDER_SITE_OTHER): Payer: Medicare HMO

## 2020-01-03 ENCOUNTER — Other Ambulatory Visit: Payer: Self-pay

## 2020-01-03 DIAGNOSIS — R1084 Generalized abdominal pain: Secondary | ICD-10-CM

## 2020-01-03 DIAGNOSIS — R1011 Right upper quadrant pain: Secondary | ICD-10-CM | POA: Diagnosis not present

## 2020-01-03 DIAGNOSIS — R112 Nausea with vomiting, unspecified: Secondary | ICD-10-CM

## 2020-01-03 DIAGNOSIS — R1110 Vomiting, unspecified: Secondary | ICD-10-CM | POA: Diagnosis not present

## 2020-01-03 HISTORY — PX: CT ABDOMEN PELVIS W (ARMC HX): HXRAD1333

## 2020-01-03 MED ORDER — IOPAMIDOL (ISOVUE-300) INJECTION 61%
100.0000 mL | Freq: Once | INTRAVENOUS | Status: AC | PRN
  Administered 2020-01-03: 100 mL via INTRAVENOUS

## 2020-01-04 ENCOUNTER — Encounter: Payer: Self-pay | Admitting: Family Medicine

## 2020-01-05 ENCOUNTER — Telehealth: Payer: Self-pay | Admitting: Family Medicine

## 2020-01-05 DIAGNOSIS — K8020 Calculus of gallbladder without cholecystitis without obstruction: Secondary | ICD-10-CM

## 2020-01-05 DIAGNOSIS — K828 Other specified diseases of gallbladder: Secondary | ICD-10-CM

## 2020-01-05 DIAGNOSIS — R1011 Right upper quadrant pain: Secondary | ICD-10-CM

## 2020-01-05 DIAGNOSIS — I700 Atherosclerosis of aorta: Secondary | ICD-10-CM

## 2020-01-05 NOTE — Telephone Encounter (Signed)
Please call patient:  I understand her concern.  Atherosclerosis is caused from elevated cholesterol.    Atherosclerosis is the fatty plaque-like deposition that builds up over time and the vessels of the body. She has had atherosclerotic changes since her abdominal CT in 2010. Per prior labs and records able to review, she has had elevated cholesterol for many years and intolerant to statins. She was established with cardiology- last seen in 2015 by Dr. Irish Lack.  A plan of action would be to start a medication to lower her cholesterol, Increase her exercise and make dietary changes-such as a Mediterranean diet to help lower her cholesterol. Its great she is interested in a plan of action.  I will refer her back to cardiology so that they can discuss other medications to help control her atherosclerosis and cholesterol.  There are now other medications available to help control cholesterol.  In the meantime she can work on increasing her exercise and try Mediterranean diet.  There are many available recipes and books on this diet available.   A mediterranean diet is high in fruits, vegetables, whole grains, fish, chicken, nuts, healthy fats (olive oil or canola oil). Low fat dairy.Limit butter, margarine, red meat and sweets.

## 2020-01-05 NOTE — Telephone Encounter (Signed)
Pt was called and given lab results, she verbalized understanding.   Pt is concerned with seeing "Advanced atherosclerotic calcifications involving the aorta and iliac arteries for the patient's age." She would like to know what that means and the plan of action. She also is asking if this is why her BP is always high and she cannot get it controlled.

## 2020-01-05 NOTE — Telephone Encounter (Signed)
Pt was called and given information. She verbalized understanding

## 2020-01-05 NOTE — Telephone Encounter (Signed)
Pt was called and VM was left to return call  °

## 2020-01-05 NOTE — Addendum Note (Signed)
Addended by: Howard Pouch A on: 01/05/2020 12:50 PM   Modules accepted: Orders

## 2020-01-05 NOTE — Telephone Encounter (Signed)
Please inform patient  Her abdominal CT resulted with gallbladder sludge and gallstones which is likely creating her abdominal pain. She does have quite extensive amount of diverticulosis diffusely throughout her colon without findings of diverticulitis (inflamed or infected diverticulosis).   I have went ahead and referred her to a surgeon to discuss since she continues to have chronic pain in her right upper quadrant.  If surgeon feels pain is coming from gallstones or gallbladder sludge, it is better to schedule gallbladder removal then have to perform emergently.  She should be hearing from surgery to schedule consult

## 2020-01-07 ENCOUNTER — Ambulatory Visit: Payer: Medicare HMO | Admitting: Gastroenterology

## 2020-02-03 ENCOUNTER — Other Ambulatory Visit: Payer: Self-pay

## 2020-02-03 ENCOUNTER — Ambulatory Visit: Payer: Medicare HMO | Admitting: Interventional Cardiology

## 2020-02-03 ENCOUNTER — Encounter: Payer: Self-pay | Admitting: Interventional Cardiology

## 2020-02-03 VITALS — BP 132/74 | HR 64 | Ht <= 58 in | Wt 133.8 lb

## 2020-02-03 DIAGNOSIS — E782 Mixed hyperlipidemia: Secondary | ICD-10-CM | POA: Diagnosis not present

## 2020-02-03 DIAGNOSIS — I700 Atherosclerosis of aorta: Secondary | ICD-10-CM | POA: Diagnosis not present

## 2020-02-03 DIAGNOSIS — Z01810 Encounter for preprocedural cardiovascular examination: Secondary | ICD-10-CM

## 2020-02-03 MED ORDER — ROSUVASTATIN CALCIUM 10 MG PO TABS: ORAL_TABLET | ORAL | 0 refills | Status: DC

## 2020-02-03 NOTE — Progress Notes (Signed)
Cardiology Office Note   Date:  02/03/2020   ID:  Rita Levine, DOB [birth year], MRN [id number]  PCP:  Ma Hillock, DO    No chief complaint on file.  Aortic atherosclerosis  Wt Readings from Last 3 Encounters:  02/03/20 133 lb 12.8 oz (60.7 kg)  12/29/19 138 lb (62.6 kg)  12/10/19 142 lb 6 oz (64.6 kg)       History of Present Illness: Rita Levine is a 69 y.o. female who is being seen today for the evaluation of aortic atherosclerosis at the request of [place], [place], DO.  3/21 CT: Vascular/Lymphatic: Advanced atherosclerotic calcifications involving the aorta and iliac arteries for the patient's age.   She has no family h/o CAD.  Mother was 51 when she passed away.  Siblings without CAD.    She had a negative ETT in 2015.  She had intolerance to Zocor in the past.   Denies : Chest pain.  Leg edema. Nitroglycerin use. Orthopnea. Palpitations. Paroxysmal nocturnal dyspnea. Shortness of breath. Syncope.   Mild DOE with bending down.  She walks regularly.  Knee pain limits walking stairs.  She walks 12K-20K steps /day, including dedicated walk for exercise.    She is friends with Chrissie Noa and Ardis Rowan.   Past Medical History:  Diagnosis Date  . Allergic rhinitis   . Anemia   . Asthma   . Chest pain, unspecified 04/07/2014  . Chicken pox   . Colon polyp   . Diverticulosis   . Dysphagia   . Fibromyalgia   . Food allergy   . Gallstone   . GERD (gastroesophageal reflux disease)   . Hiatal hernia   . Hypercholesteremia   . Hypertension   . Hypothyroidism   . IBS (irritable bowel syndrome)   . Migraine headache   . Osteoarthritis   . Osteopenia   . Pulmonary hypertension ([place])    pt denies  . Rectal bleeding   . SBO (small bowel obstruction) ([place]) 10/20/2017  . Seasonal allergies   . Thyroid nodule     Past Surgical History:  Procedure Laterality Date  . APPENDECTOMY  1982  . BREAST EXCISIONAL BIOPSY Right 1995   benign  . BREAST  LUMPECTOMY  1991  . CARPAL TUNNEL RELEASE Right 2006   Right wrist  . CESAREAN SECTION     x 2  . COLON RESECTION  1990  . COLONOSCOPY    . IMAGE MRI brain:  04/2012   No focal IAC or inner ear lesion to explain hearing loss. Slightly greater than expected number of subcortical T2- hyperintensities bilaterally.  These are nonspecific.  They can be seen in the setting of chronic migraine headaches, demyelinating process, chronic microvascular ischemic, prior infection or inflammation, or vasculitis  . IMAGE MRI lumbar:  05/01/2006   Mild central canal stenosis with facet arthropathy and mildly bulging disc at L4-5.  There is mild narrowing in the left lateral recess at this level.  No definite neural impingement.  Appearance at this level has not markedly Moderately severe facet arthropathy at L5-S1 with mild interval progression of a diffuse broad based disc bulge.  There is mild biforaminal narrowing.  Central canal is open  . LAPAROSCOPIC ABDOMINAL EXPLORATION  2019   adhesion removal> caused bowel blockage   . NASAL SEPTUM SURGERY  2004  . TONSILLECTOMY  1965  . TOTAL ABDOMINAL HYSTERECTOMY W/ BILATERAL SALPINGOOPHORECTOMY  1988  . TUBAL LIGATION  1974  . UPPER GASTROINTESTINAL ENDOSCOPY  2020     Current Outpatient Medications  Medication Sig Dispense Refill  . acyclovir (ZOVIRAX) 400 MG tablet Take 400 mg by mouth 3 (three) times daily. Take one tablet three times daily as needed for cold sores    . albuterol (PROAIR HFA) 108 (90 Base) MCG/ACT inhaler Inhale 2 puffs into the lungs every 6 (six) hours as needed. 6.7 g 5  . albuterol (PROVENTIL) (2.5 MG/3ML) 0.083% nebulizer solution Take 2.5 mg by nebulization every 6 (six) hours as needed.      Francia Greaves THYROID 15 MG tablet Take 1 tablet (15 mg total) by mouth daily. 90 tablet 2  . ARMOUR THYROID 30 MG tablet Take 1 tablet (30 mg total) by mouth daily before breakfast. 90 tablet 2  . Ascorbic Acid (VITAMIN C) 1000 MG tablet Take  3,000 mg by mouth daily.     Marland Kitchen atenolol (TENORMIN) 25 MG tablet Take 1 tablet (25 mg total) by mouth daily. 90 tablet 1  . benzonatate (TESSALON) 100 MG capsule Take 100 mg by mouth 3 (three) times daily as needed.    . budesonide-formoterol (SYMBICORT) 80-4.5 MCG/ACT inhaler Inhale 2 puffs into the lungs in the morning and at bedtime. 1 Inhaler 5  . Cholecalciferol (VITAMIN D) 125 MCG (5000 UT) CAPS Take by mouth.    . Citrus Bergamot 250 MG/0.25GM POWD Take by mouth.    . clobetasol (TEMOVATE) 0.05 % external solution Apply 1 application topically 2 (two) times daily.    Marland Kitchen co-enzyme Q-10 30 MG capsule Take 30 mg by mouth 3 (three) times daily.    . diclofenac (VOLTAREN) 75 MG EC tablet TAKE 1 TABLET BY MOUTH TWICE A DAY 60 tablet 5  . Docosahexaenoic Acid (DHA COMPLETE PO) Take by mouth.    . famotidine (PEPCID) 20 MG tablet Take 1 tablet (20 mg total) by mouth daily. 90 tablet 1  . fexofenadine (ALLEGRA) 180 MG tablet Take 1 tablet (180 mg total) by mouth daily. 90 tablet 1  . fluticasone (FLONASE) 50 MCG/ACT nasal spray Place 2 sprays into both nostrils daily. 16 g 5  . hyoscyamine (LEVSIN) 0.125 MG tablet Take 1 tablet (0.125 mg total) by mouth every 6 (six) hours as needed. 120 tablet 1  . Lactobacillus (ACIDOPHILUS PO) Take 1 capsule by mouth daily.      Marland Kitchen levocetirizine (XYZAL) 5 MG tablet Take 5 mg by mouth every evening.    Marland Kitchen losartan (COZAAR) 25 MG tablet Take 0.5-1 tablets (12.5-25 mg total) by mouth daily. 90 tablet 1  . montelukast (SINGULAIR) 10 MG tablet Take 1 tablet (10 mg total) by mouth at bedtime. 90 tablet 1  . Multiple Vitamin (MULTIVITAMIN) tablet Take 1 tablet by mouth daily.    . ondansetron (ZOFRAN) 8 MG tablet Take by mouth every 8 (eight) hours as needed for nausea or vomiting.    . pantoprazole (PROTONIX) 40 MG tablet Take 1 tablet (40 mg total) by mouth daily. 90 tablet 1  . Probiotic Product (CULTURELLE PROBIOTICS PO) Take by mouth.    Marland Kitchen tiZANidine (ZANAFLEX) 4  MG tablet Take 1 tablet (4 mg total) by mouth every 6 (six) hours as needed for muscle spasms. 120 tablet 5  . tretinoin (RETIN-A) 0.1 % cream Apply topically at bedtime.    . ValACYclovir HCl (VALTREX PO) Take by mouth as needed.    Marland Kitchen VITAMIN A PO Take 1 tablet by mouth daily.    . vitamin E 1000 UNIT capsule Take 1,000 Units by  mouth daily.    . Zinc 30 MG CAPS Take by mouth.    . zolpidem (AMBIEN) 5 MG tablet Take 1 tablet (5 mg total) by mouth at bedtime. 90 tablet 1   Current Facility-Administered Medications  Medication Dose Route Frequency Provider Last Rate Last Admin  . 0.9 %  sodium chloride infusion  500 mL Intravenous Continuous Milus Banister, MD      . 0.9 %  sodium chloride infusion  500 mL Intravenous Once Milus Banister, MD        Allergies:   Sulfamethoxazole-trimethoprim, Avelox [moxifloxacin hcl in nacl], Cephalexin, Codeine, Erythromycin, Flagyl [metronidazole], Propranolol, Septra [bactrim], Sulfa antibiotics, Tetanus toxoid, Tetanus toxoids, Tramadol, and Statins    Social History:  The patient  reports that she has never smoked. She has never used smokeless tobacco. She reports current alcohol use. She reports that she does not use drugs.   Family History:  The patient's family history includes Alcohol abuse in her brother and sister; Arthritis in her brother, brother, father, and mother; Asthma in her father; Breast cancer in her cousin and maternal aunt; COPD in her brother, father, sister, sister, and sister; Cancer in her sister; Clotting disorder in her sister; Depression in her brother; Diabetes in her brother and father; Diabetes type II in her sister; Early death in her father, sister, and sister; Emphysema in her maternal aunt and maternal uncle; Hearing loss in her mother; Heart disease in her mother and sister; Hypercholesterolemia in her brother; Hyperlipidemia in her father and mother; Hypertension in her father, mother, and sister; Kidney disease in her  mother; Lung cancer in her sister and another family member; Macular degeneration in her mother; Throat cancer in her brother.    ROS:  Please see the history of present illness.   Otherwise, review of systems are positive for .   All other systems are reviewed and negative.    PHYSICAL EXAM: VS:  BP 132/74   Pulse 64   Ht 4\' 10"  (1.473 m)   Wt 133 lb 12.8 oz (60.7 kg)   SpO2 97%   BMI 27.96 kg/m  , BMI Body mass index is 27.96 kg/m. GEN: Well nourished, well developed, in no acute distress  HEENT: normal  Neck: no JVD, carotid bruits, or masses Cardiac: RRR; no murmurs, rubs, or gallops,no edema  Respiratory:  clear to auscultation bilaterally, normal work of breathing GI: soft, nontender, nondistended, + BS MS: no deformity or atrophy  Skin: warm and dry, no rash Neuro:  Strength and sensation are intact Psych: euthymic mood, full affect   EKG:   The ekg ordered today demonstrates NSR, no ST changes   Recent Labs: 12/29/2019: ALT 16; BUN 11; Creat 0.68; Hemoglobin 13.3; Platelets 300; Potassium 4.3; Sodium 140   Lipid Panel No results found for: CHOL, TRIG, HDL, CHOLHDL, VLDL, LDLCALC, LDLDIRECT   Other studies Reviewed: Additional studies/ records that were reviewed today with results demonstrating: LDL 185 in 07/2019.   ASSESSMENT AND PLAN:  1. Aortic atherosclerosis /hyperlipidemia: start Crestor 10 mg once a week to start with.  After 2 weeks, will increase to twice a week. Check labs in 8 weeks.  Will try to titrate as her sx allow.  Would like to see LDL below 100.  If she has difficulty tolerating statin, could consider adding co-Q10. 2. Preoperative eval: No further cardiac testing needed given that she is very physically active an has no sx. 3. Diet limited by gall bladder issues.  After cholecystectomy, will have to resassess.    Current medicines are reviewed at length with the patient today.  The patient concerns regarding her medicines were  addressed.  The following changes have been made:  No change  Labs/ tests ordered today include:  No orders of the defined types were placed in this encounter.   Recommend 150 minutes/week of aerobic exercise   Disposition:   FU in 1 year   Signed, Larae Grooms, MD  02/03/2020 11:23 AM    [facility] [address], [place], [place]  [address] Phone: [phone]; Fax: [phone]

## 2020-02-03 NOTE — Patient Instructions (Signed)
Medication Instructions:  Your physician has recommended you make the following change in your medication:   START: rosuvastatin (crestor) 10 mg tablet: Take 1 tablet by mouth once a week for 1 week, then increase to taking 1 tablet by mouth twice a week  *If you need a refill on your cardiac medications before your next appointment, please call your pharmacy*   Lab Work: Your physician recommends that you return for a FASTING lipid profile and liver function panel in 2 months  If you have labs (blood work) drawn today and your tests are completely normal, you will receive your results only by: Marland Kitchen MyChart Message (if you have MyChart) OR . A paper copy in the mail If you have any lab test that is abnormal or we need to change your treatment, we will call you to review the results.   Testing/Procedures: None ordered   Follow-Up: At [facility], you and your health needs are our priority.  As part of our continuing mission to provide you with exceptional heart care, we have created designated Provider Care Teams.  These Care Teams include your primary Cardiologist (physician) and Advanced Practice Providers (APPs -  Physician Assistants and Nurse Practitioners) who all work together to provide you with the care you need, when you need it.  We recommend signing up for the patient portal called "MyChart".  Sign up information is provided on this After Visit Summary.  MyChart is used to connect with patients for Virtual Visits (Telemedicine).  Patients are able to view lab/test results, encounter notes, upcoming appointments, etc.  Non-urgent messages can be sent to your provider as well.   To learn more about what you can do with MyChart, go to [email or web address].    Your next appointment:   12 month(s)  The format for your next appointment:   In Person  Provider:   You may see Larae Grooms, MD or one of the following Advanced Practice Providers on your designated Care Team:     Melina Copa, PA-C  Ermalinda Barrios, PA-C    Other Instructions

## 2020-02-29 ENCOUNTER — Other Ambulatory Visit: Payer: Self-pay | Admitting: General Surgery

## 2020-02-29 DIAGNOSIS — K811 Chronic cholecystitis: Secondary | ICD-10-CM | POA: Diagnosis not present

## 2020-03-15 ENCOUNTER — Inpatient Hospital Stay (HOSPITAL_COMMUNITY): Admission: RE | Admit: 2020-03-15 | Payer: Medicare HMO | Source: Ambulatory Visit

## 2020-03-18 ENCOUNTER — Other Ambulatory Visit (HOSPITAL_COMMUNITY): Payer: Medicare HMO

## 2020-03-22 ENCOUNTER — Ambulatory Visit (HOSPITAL_COMMUNITY): Admission: RE | Admit: 2020-03-22 | Payer: Medicare HMO | Source: Home / Self Care | Admitting: General Surgery

## 2020-03-22 ENCOUNTER — Encounter (HOSPITAL_COMMUNITY): Admission: RE | Payer: Self-pay | Source: Home / Self Care

## 2020-03-22 SURGERY — LAPAROSCOPIC CHOLECYSTECTOMY
Anesthesia: General

## 2020-03-24 ENCOUNTER — Encounter: Payer: Self-pay | Admitting: Family Medicine

## 2020-03-24 ENCOUNTER — Telehealth (INDEPENDENT_AMBULATORY_CARE_PROVIDER_SITE_OTHER): Payer: Medicare HMO | Admitting: Family Medicine

## 2020-03-24 VITALS — BP 119/73 | HR 55 | Temp 98.1°F | Ht <= 58 in | Wt 130.0 lb

## 2020-03-24 DIAGNOSIS — B9689 Other specified bacterial agents as the cause of diseases classified elsewhere: Secondary | ICD-10-CM

## 2020-03-24 DIAGNOSIS — J329 Chronic sinusitis, unspecified: Secondary | ICD-10-CM | POA: Diagnosis not present

## 2020-03-24 MED ORDER — PREDNISONE 20 MG PO TABS: 20.0000 mg | ORAL_TABLET | Freq: Every day | ORAL | 0 refills | Status: DC

## 2020-03-24 MED ORDER — DOXYCYCLINE HYCLATE 100 MG PO TABS
100.0000 mg | ORAL_TABLET | Freq: Two times a day (BID) | ORAL | 0 refills | Status: DC
Start: 2020-03-24 — End: 2020-07-18

## 2020-03-24 NOTE — Progress Notes (Signed)
VIRTUAL VISIT VIA VIDEO  I connected with Rita Levine on 03/24/20 at  3:30 PM EDT by elemedicine application and verified that I am speaking with the correct person using two identifiers. Location patient: Home Location provider: [facility], Office Persons participating in the virtual visit: Patient, Dr. Raoul Pitch and R.Baker, LPN  I discussed the limitations of evaluation and management by telemedicine and the availability of in person appointments. The patient expressed understanding and agreed to proceed.  Interactive audio and video telecommunications were attempted between this provider and patient, however failed, due to patient having technical difficulties. We continued and completed visit with audio only.   SUBJECTIVE Chief Complaint  Patient presents with  . Nasal Congestion    Pt feels she has a sinus infection x3 weeks. No fever.   . Facial Pain  . Ear Pain    HPI: Rita Levine is a 69 y.o. female present for sinus pressure and pain of 3 week duration. She reports ear pain and pressure. She denies fever, chills, nausea, vomit, rash or GI symptoms. She suffers from chronic allergies and routinely takes singulair, xyzal, allegra, flonase. She has a h/o asthma. She reports her asthma has been fairly controlled during acute illness, but an occasional wheeze/tightness feeling in her chest.   ROS: See pertinent positives and negatives per HPI.  Patient Active Problem List   Diagnosis Date Noted  . Aortic atherosclerosis ([place]) 02/03/2020  . Allergic rhinitis due to pollen 12/03/2019  . Multiple food allergies 12/03/2019  . IBS (irritable bowel syndrome) 12/03/2019  . Statin declined 12/03/2019  . DDD (degenerative disc disease), lumbar 12/03/2019  . Lumbar stenosis 12/03/2019  . Obesity (BMI 30-39.9) 12/03/2019  . Sleep disturbance 12/03/2019  . Fibromyalgia   . GERD (gastroesophageal reflux disease)   . Hypertension   . Hypothyroidism   . Osteoarthritis   .  Osteopenia   . Thyroid nodule   . Seasonal allergies   . Palpitations 04/07/2014  . Mixed hyperlipidemia 04/07/2014  . Asthma, intrinsic 04/01/2011    Social History   Tobacco Use  . Smoking status: Never Smoker  . Smokeless tobacco: Never Used  Substance Use Topics  . Alcohol use: Yes    Alcohol/week: 0.0 standard drinks    Comment: occasionally    Current Outpatient Medications:  .  albuterol (PROAIR HFA) 108 (90 Base) MCG/ACT inhaler, Inhale 2 puffs into the lungs every 6 (six) hours as needed., Disp: 6.7 g, Rfl: 5 .  ARMOUR THYROID 15 MG tablet, Take 1 tablet (15 mg total) by mouth daily., Disp: 90 tablet, Rfl: 2 .  ARMOUR THYROID 30 MG tablet, Take 1 tablet (30 mg total) by mouth daily before breakfast., Disp: 90 tablet, Rfl: 2 .  Ascorbic Acid (VITAMIN C) 1000 MG tablet, Take 3,000 mg by mouth daily. , Disp: , Rfl:  .  atenolol (TENORMIN) 25 MG tablet, Take 1 tablet (25 mg total) by mouth daily. (Patient taking differently: Take 25 mg by mouth every evening. ), Disp: 90 tablet, Rfl: 1 .  budesonide-formoterol (SYMBICORT) 80-4.5 MCG/ACT inhaler, Inhale 2 puffs into the lungs in the morning and at bedtime., Disp: 1 Inhaler, Rfl: 5 .  Cholecalciferol (VITAMIN D) 125 MCG (5000 UT) CAPS, Take 5,000 Units by mouth daily. , Disp: , Rfl:  .  clobetasol (TEMOVATE) 0.05 % external solution, Apply 1 application topically 2 (two) times daily., Disp: , Rfl:  .  co-enzyme Q-10 30 MG capsule, Take 30 mg by mouth daily. ,  Disp: , Rfl:  .  diclofenac (VOLTAREN) 75 MG EC tablet, TAKE 1 TABLET BY MOUTH TWICE A DAY, Disp: 60 tablet, Rfl: 5 .  famotidine (PEPCID) 20 MG tablet, Take 1 tablet (20 mg total) by mouth daily., Disp: 90 tablet, Rfl: 1 .  fexofenadine (ALLEGRA) 180 MG tablet, Take 1 tablet (180 mg total) by mouth daily., Disp: 90 tablet, Rfl: 1 .  fluticasone (FLONASE) 50 MCG/ACT nasal spray, Place 2 sprays into both nostrils daily. (Patient taking differently: Place 1 spray into both  nostrils in the morning and at bedtime. ), Disp: 16 g, Rfl: 5 .  hyoscyamine (LEVSIN) 0.125 MG tablet, Take 1 tablet (0.125 mg total) by mouth every 6 (six) hours as needed., Disp: 120 tablet, Rfl: 1 .  levocetirizine (XYZAL) 5 MG tablet, Take 5 mg by mouth every evening., Disp: , Rfl:  .  losartan (COZAAR) 25 MG tablet, Take 0.5-1 tablets (12.5-25 mg total) by mouth daily. (Patient taking differently: Take 25 mg by mouth daily. ), Disp: 90 tablet, Rfl: 1 .  meloxicam (MOBIC) 15 MG tablet, Take 15 mg by mouth every evening., Disp: , Rfl:  .  montelukast (SINGULAIR) 10 MG tablet, Take 1 tablet (10 mg total) by mouth at bedtime. (Patient taking differently: Take 10 mg by mouth in the morning. ), Disp: 90 tablet, Rfl: 1 .  Multiple Vitamin (MULTIVITAMIN) tablet, Take 1 tablet by mouth daily., Disp: , Rfl:  .  pantoprazole (PROTONIX) 40 MG tablet, Take 1 tablet (40 mg total) by mouth daily., Disp: 90 tablet, Rfl: 1 .  rosuvastatin (CRESTOR) 10 MG tablet, Take 1 tablet once a week for 1 week, then increase to taking 1 tablet twice a week (Patient taking differently: Take 10 mg by mouth 2 (two) times a week. ), Disp: 30 tablet, Rfl: 0 .  tiZANidine (ZANAFLEX) 4 MG tablet, Take 1 tablet (4 mg total) by mouth every 6 (six) hours as needed for muscle spasms., Disp: 120 tablet, Rfl: 5 .  VITAMIN A PO, Take 1 tablet by mouth daily., Disp: , Rfl:  .  vitamin E 1000 UNIT capsule, Take 1,000 Units by mouth daily., Disp: , Rfl:  .  Zinc 30 MG CAPS, Take 30 mg by mouth daily. , Disp: , Rfl:  .  zolpidem (AMBIEN) 5 MG tablet, Take 1 tablet (5 mg total) by mouth at bedtime. (Patient taking differently: Take 5 mg by mouth at bedtime as needed for sleep. ), Disp: 90 tablet, Rfl: 1 .  doxycycline (VIBRA-TABS) 100 MG tablet, Take 1 tablet (100 mg total) by mouth 2 (two) times daily., Disp: 20 tablet, Rfl: 0 .  predniSONE (DELTASONE) 20 MG tablet, Take 1 tablet (20 mg total) by mouth daily with breakfast., Disp: 8 tablet,  Rfl: 0  Current Facility-Administered Medications:  .  0.9 %  sodium chloride infusion, 500 mL, Intravenous, Continuous, Milus Banister, MD .  0.9 %  sodium chloride infusion, 500 mL, Intravenous, Once, Milus Banister, MD  Allergies  Allergen Reactions  . Sulfamethoxazole-Trimethoprim Hives  . Avelox [Moxifloxacin Hcl In Nacl]     Racing heart  . Cephalexin Hives  . Codeine     Heart racing/hives  . Erythromycin Hives  . Flagyl [Metronidazole]   . Propranolol Hives  . Septra [Bactrim] Hives  . Sulfa Antibiotics Hives  . Tetanus Toxoid Swelling    Reports a fever and headache with swelling.   . Tetanus Toxoids   . Tramadol Hives  . Statins Other (See  Comments)    Myalgia     OBJECTIVE: BP 119/73   Pulse (!) 55   Temp 98.1 F (36.7 C) (Temporal)   Ht 4\' 10"  (1.473 m)   Wt 130 lb (59 kg)   BMI 27.17 kg/m  Gen: No acute distress. Sounds congested.   Chest: Cough not present on exam.  Neuro:  Alert. Oriented x3   ASSESSMENT AND PLAN: DOROTHEY OETKEN is a 69 y.o. female present for  Bacterial sinusitis Rest, hydrate.  Continue  Flonase, xyzal, singulair and  mucinex (DM if cough), nettie pot or nasal saline.  Prednisone burst and doxy prescribed, take until completed.  F/U 2 weeks of not improved.   > 15 Minutes was dedicated to this patient's encounter to include pre-visit review of chart, face-to-face time with patient and post-visit work- which include documentation and prescribing medications and/or ordering test when necessary.     No orders of the defined types were placed in this encounter.  Meds ordered this encounter  Medications  . predniSONE (DELTASONE) 20 MG tablet    Sig: Take 1 tablet (20 mg total) by mouth daily with breakfast.    Dispense:  8 tablet    Refill:  0  . doxycycline (VIBRA-TABS) 100 MG tablet    Sig: Take 1 tablet (100 mg total) by mouth 2 (two) times daily.    Dispense:  20 tablet    Refill:  0   Referral Orders  No  referral(s) requested today

## 2020-04-04 ENCOUNTER — Other Ambulatory Visit: Payer: Medicare HMO

## 2020-04-05 ENCOUNTER — Other Ambulatory Visit (INDEPENDENT_AMBULATORY_CARE_PROVIDER_SITE_OTHER): Payer: Medicare HMO | Admitting: *Deleted

## 2020-04-05 ENCOUNTER — Other Ambulatory Visit: Payer: Self-pay

## 2020-04-05 DIAGNOSIS — E782 Mixed hyperlipidemia: Secondary | ICD-10-CM | POA: Diagnosis not present

## 2020-04-05 DIAGNOSIS — I700 Atherosclerosis of aorta: Secondary | ICD-10-CM | POA: Diagnosis not present

## 2020-04-05 LAB — HEPATIC FUNCTION PANEL
ALT: 18 IU/L (ref 0–32)
AST: 19 IU/L (ref 0–40)
Albumin: 4.1 g/dL (ref 3.8–4.8)
Alkaline Phosphatase: 80 IU/L (ref 48–121)
Bilirubin Total: 0.3 mg/dL (ref 0.0–1.2)
Bilirubin, Direct: 0.1 mg/dL (ref 0.00–0.40)
Total Protein: 6.4 g/dL (ref 6.0–8.5)

## 2020-04-05 LAB — LIPID PANEL
Chol/HDL Ratio: 4.4 ratio (ref 0.0–4.4)
Cholesterol, Total: 221 mg/dL — ABNORMAL HIGH (ref 100–199)
HDL: 50 mg/dL (ref >39)
LDL Chol Calc (NIH): 148 mg/dL — ABNORMAL HIGH (ref 0–99)
Triglycerides: 129 mg/dL (ref 0–149)
VLDL Cholesterol Cal: 23 mg/dL (ref 5–40)

## 2020-04-07 ENCOUNTER — Other Ambulatory Visit: Payer: Self-pay

## 2020-04-07 DIAGNOSIS — E782 Mixed hyperlipidemia: Secondary | ICD-10-CM

## 2020-04-07 MED ORDER — ROSUVASTATIN CALCIUM 10 MG PO TABS: ORAL_TABLET | ORAL | 3 refills | Status: DC

## 2020-04-07 MED ORDER — COENZYME Q10 30 MG PO CAPS: 30.0000 mg | ORAL_CAPSULE | Freq: Every day | ORAL | 3 refills | Status: DC

## 2020-04-18 NOTE — Telephone Encounter (Signed)
Patient c/o increased aches and pains in MyChart message with increased frequency of crestor. Please advise.  Please see previous instructions from result note below:   Cleon Gustin, RN  04/07/2020 1:25 PM EDT Back to Top    The patient has been notified of the result and verbalized understanding. She has not been taking her CoQ10. She has been taking rosuvastatin 2x/weekly. Discussed with JV and she will increase rosuvastatin to 3x/week and restart CoQ10. Repeat Lipids scheduled for 9/27. All questions (if any) were answered. Cleon Gustin, RN 04/07/2020 1:23 PM    Jettie Booze, MD  04/05/2020 6:04 PM EDT     LDL > 100. Starting statin. Target LDL < 100. Could try CoQ10 to help tolerate statin. Could consider Zetia as well.

## 2020-04-19 MED ORDER — ROSUVASTATIN CALCIUM 10 MG PO TABS: ORAL_TABLET | ORAL | 3 refills | Status: DC

## 2020-04-19 MED ORDER — EZETIMIBE 10 MG PO TABS
10.0000 mg | ORAL_TABLET | Freq: Every day | ORAL | 3 refills | Status: DC
Start: 2020-04-19 — End: 2021-03-16

## 2020-04-21 ENCOUNTER — Telehealth: Payer: Self-pay

## 2020-04-21 DIAGNOSIS — H9202 Otalgia, left ear: Secondary | ICD-10-CM | POA: Diagnosis not present

## 2020-04-21 NOTE — Telephone Encounter (Signed)
She will have to take first available or we can place her with another provider somewhere else.

## 2020-04-21 NOTE — Telephone Encounter (Signed)
Pt left VM on nurses line asking for appt today. We have no openings. Please schedule with another provider or pt will need to go to urgent care for her ear pain

## 2020-04-21 NOTE — Telephone Encounter (Signed)
Patient is requesting an appointment next week for ear pain &  tickling sensation. No appointments available.

## 2020-04-25 ENCOUNTER — Other Ambulatory Visit: Payer: Self-pay | Admitting: Interventional Cardiology

## 2020-05-07 ENCOUNTER — Other Ambulatory Visit: Payer: Self-pay | Admitting: Family Medicine

## 2020-05-17 DIAGNOSIS — J310 Chronic rhinitis: Secondary | ICD-10-CM | POA: Diagnosis not present

## 2020-05-17 DIAGNOSIS — H9202 Otalgia, left ear: Secondary | ICD-10-CM | POA: Diagnosis not present

## 2020-05-17 DIAGNOSIS — L299 Pruritus, unspecified: Secondary | ICD-10-CM | POA: Diagnosis not present

## 2020-05-17 DIAGNOSIS — K1120 Sialoadenitis, unspecified: Secondary | ICD-10-CM | POA: Diagnosis not present

## 2020-05-29 ENCOUNTER — Telehealth: Payer: Self-pay

## 2020-05-29 DIAGNOSIS — L708 Other acne: Secondary | ICD-10-CM

## 2020-05-29 DIAGNOSIS — L219 Seborrheic dermatitis, unspecified: Secondary | ICD-10-CM

## 2020-05-29 NOTE — Telephone Encounter (Signed)
Patient seeking: Rertin-A & Temovate.

## 2020-05-29 NOTE — Telephone Encounter (Signed)
Needs new prescriptions for   tretinoin (RETIN-A) 0.1 % cream [[id number] clobetasol cream (TEMOVATE) 0.05 % [[id number]]   [facility]

## 2020-05-29 NOTE — Telephone Encounter (Signed)
Requested medications of Retin-A cream and clobetasol cream have not been prescribed by this provider in the past.  Retin-A cream is also not on her medication list.  Who has prescribed this in the past for her?  When is the last time she used it?     What is she using each of the medications for?

## 2020-05-30 NOTE — Telephone Encounter (Signed)
LM for pt to returncall

## 2020-06-02 DIAGNOSIS — L219 Seborrheic dermatitis, unspecified: Secondary | ICD-10-CM | POA: Insufficient documentation

## 2020-06-02 DIAGNOSIS — L709 Acne, unspecified: Secondary | ICD-10-CM

## 2020-06-02 HISTORY — DX: Seborrheic dermatitis, unspecified: L21.9

## 2020-06-02 HISTORY — DX: Acne, unspecified: L70.9

## 2020-06-02 MED ORDER — CLOBETASOL PROPIONATE 0.05 % EX CREA: 1.0000 "application " | TOPICAL_CREAM | Freq: Two times a day (BID) | CUTANEOUS | 1 refills | Status: DC

## 2020-06-02 MED ORDER — TRETINOIN 0.1 % EX CREA: TOPICAL_CREAM | Freq: Every day | CUTANEOUS | 1 refills | Status: DC

## 2020-06-02 NOTE — Telephone Encounter (Signed)
Refill of both requested creams.

## 2020-06-02 NOTE — Addendum Note (Signed)
Addended by: Howard Pouch A on: 06/02/2020 04:48 PM   Modules accepted: Orders

## 2020-06-02 NOTE — Telephone Encounter (Signed)
Patient returned call. Advised she was using clobetasol cream for an ongoing scalp condition and the retin-a for breakout around her nose. She could not recall last prescriber but last used both yesterday. Pharmacy verified. Advised message would be resent for review to PCP

## 2020-06-02 NOTE — Telephone Encounter (Signed)
Left detailed message on cell advising refills sent to local pharmacy, okay per dpr.

## 2020-06-26 ENCOUNTER — Other Ambulatory Visit: Payer: Self-pay | Admitting: Family Medicine

## 2020-06-30 ENCOUNTER — Other Ambulatory Visit: Payer: Self-pay | Admitting: Family Medicine

## 2020-07-05 ENCOUNTER — Other Ambulatory Visit: Payer: Self-pay

## 2020-07-05 ENCOUNTER — Other Ambulatory Visit: Payer: Medicare HMO

## 2020-07-05 DIAGNOSIS — E782 Mixed hyperlipidemia: Secondary | ICD-10-CM

## 2020-07-05 LAB — LIPID PANEL
Chol/HDL Ratio: 4.2 ratio (ref 0.0–4.4)
Cholesterol, Total: 174 mg/dL (ref 100–199)
HDL: 41 mg/dL (ref >39)
LDL Chol Calc (NIH): 105 mg/dL — ABNORMAL HIGH (ref 0–99)
Triglycerides: 162 mg/dL — ABNORMAL HIGH (ref 0–149)
VLDL Cholesterol Cal: 28 mg/dL (ref 5–40)

## 2020-07-06 ENCOUNTER — Other Ambulatory Visit: Payer: Self-pay

## 2020-07-06 DIAGNOSIS — E782 Mixed hyperlipidemia: Secondary | ICD-10-CM

## 2020-07-09 ENCOUNTER — Other Ambulatory Visit: Payer: Self-pay | Admitting: Family Medicine

## 2020-07-09 DIAGNOSIS — K219 Gastro-esophageal reflux disease without esophagitis: Secondary | ICD-10-CM

## 2020-07-10 ENCOUNTER — Other Ambulatory Visit: Payer: Medicare HMO

## 2020-07-13 ENCOUNTER — Telehealth: Payer: Self-pay

## 2020-07-13 DIAGNOSIS — H1131 Conjunctival hemorrhage, right eye: Secondary | ICD-10-CM | POA: Diagnosis not present

## 2020-07-13 MED ORDER — ONDANSETRON 8 MG PO TBDP: 8.0000 mg | ORAL_TABLET | Freq: Three times a day (TID) | ORAL | 1 refills | Status: DC | PRN

## 2020-07-13 MED ORDER — ONDANSETRON HCL 8 MG PO TABS: 8.0000 mg | ORAL_TABLET | Freq: Three times a day (TID) | ORAL | 1 refills | Status: DC | PRN

## 2020-07-13 NOTE — Addendum Note (Signed)
Addended by: Howard Pouch A on: 07/13/2020 03:49 PM   Modules accepted: Orders

## 2020-07-13 NOTE — Telephone Encounter (Signed)
Pt requesting medication for nausea. States that she had it back in March for nausea also. Please advise.

## 2020-07-13 NOTE — Telephone Encounter (Signed)
Rx sent to pharmacy   

## 2020-07-13 NOTE — Telephone Encounter (Signed)
I have called in the [facility] for her.  If needing further medical care or intervention I would encourage her to schedule an appointment.

## 2020-07-13 NOTE — Telephone Encounter (Signed)
Notified pt of Rx fill

## 2020-07-13 NOTE — Telephone Encounter (Signed)
Most insurances do not pay for sublingual.   If she would like sublingual that is okay, please change the prescription to sublingual and send it to her pharmacy.

## 2020-07-13 NOTE — Telephone Encounter (Signed)
Pt states that the tablets are too expensive and would like the sublingual like she had last time because they are cheaper with her insurance.

## 2020-07-13 NOTE — Addendum Note (Signed)
Addended by: Octaviano Glow on: 07/13/2020 04:48 PM   Modules accepted: Orders

## 2020-07-13 NOTE — Telephone Encounter (Signed)
  LAST APPOINTMENT DATE: 03/24/20  NEXT APPOINTMENT DATE:@Visit  date not found  MEDICATION: Ondansetron 8 MG  PHARMACY: [id number] - [place], [place] - [address]  COMMENTS: Patient states she is needing it desperately as she has been nauseated for a few days now.

## 2020-07-14 DIAGNOSIS — R69 Illness, unspecified: Secondary | ICD-10-CM | POA: Diagnosis not present

## 2020-07-18 ENCOUNTER — Encounter: Payer: Self-pay | Admitting: Family Medicine

## 2020-07-18 ENCOUNTER — Other Ambulatory Visit: Payer: Self-pay

## 2020-07-18 ENCOUNTER — Ambulatory Visit (INDEPENDENT_AMBULATORY_CARE_PROVIDER_SITE_OTHER): Payer: Medicare HMO | Admitting: Family Medicine

## 2020-07-18 VITALS — BP 128/68 | HR 87 | Temp 98.9°F | Ht 60.0 in | Wt 132.0 lb

## 2020-07-18 DIAGNOSIS — I700 Atherosclerosis of aorta: Secondary | ICD-10-CM | POA: Diagnosis not present

## 2020-07-18 DIAGNOSIS — R002 Palpitations: Secondary | ICD-10-CM

## 2020-07-18 DIAGNOSIS — L600 Ingrowing nail: Secondary | ICD-10-CM | POA: Diagnosis not present

## 2020-07-18 DIAGNOSIS — I10 Essential (primary) hypertension: Secondary | ICD-10-CM | POA: Diagnosis not present

## 2020-07-18 LAB — CBC WITH DIFFERENTIAL/PLATELET
Basophils Absolute: 0.1 10*3/uL (ref 0.0–0.1)
Basophils Relative: 1 % (ref 0.0–3.0)
Eosinophils Absolute: 0.1 10*3/uL (ref 0.0–0.7)
Eosinophils Relative: 2.2 % (ref 0.0–5.0)
HCT: 37.2 % (ref 36.0–46.0)
Hemoglobin: 12.3 g/dL (ref 12.0–15.0)
Lymphocytes Relative: 20.7 % (ref 12.0–46.0)
Lymphs Abs: 1.4 10*3/uL (ref 0.7–4.0)
MCHC: 33.1 g/dL (ref 30.0–36.0)
MCV: 82.6 fl (ref 78.0–100.0)
Monocytes Absolute: 0.5 10*3/uL (ref 0.1–1.0)
Monocytes Relative: 8 % (ref 3.0–12.0)
Neutro Abs: 4.5 10*3/uL (ref 1.4–7.7)
Neutrophils Relative %: 68.1 % (ref 43.0–77.0)
Platelets: 287 10*3/uL (ref 150.0–400.0)
RBC: 4.51 Mil/uL (ref 3.87–5.11)
RDW: 13.3 % (ref 11.5–15.5)
WBC: 6.6 10*3/uL (ref 4.0–10.5)

## 2020-07-18 LAB — TSH: TSH: 1.14 u[IU]/mL (ref 0.35–4.50)

## 2020-07-18 LAB — BASIC METABOLIC PANEL
BUN: 11 mg/dL (ref 6–23)
CO2: 28 mEq/L (ref 19–32)
Calcium: 9.2 mg/dL (ref 8.4–10.5)
Chloride: 103 mEq/L (ref 96–112)
Creatinine, Ser: 0.74 mg/dL (ref 0.40–1.20)
GFR: 82.56 mL/min (ref >60.00)
Glucose, Bld: 122 mg/dL — ABNORMAL HIGH (ref 70–99)
Potassium: 4.4 mEq/L (ref 3.5–5.1)
Sodium: 139 mEq/L (ref 135–145)

## 2020-07-18 MED ORDER — DOXYCYCLINE HYCLATE 100 MG PO TABS: 100.0000 mg | ORAL_TABLET | Freq: Two times a day (BID) | ORAL | 0 refills | Status: DC

## 2020-07-18 NOTE — Progress Notes (Signed)
This visit occurred during the SARS-CoV-2 public health emergency.  Safety protocols were in place, including screening questions prior to the visit, additional usage of staff PPE, and extensive cleaning of exam room while observing appropriate contact time as indicated for disinfecting solutions.    Rita Levine , 1950-11-16, 69 y.o., female MRN: [id number] Patient Care Team    Relationship Specialty Notifications Start End  Ma Hillock, DO PCP - General Family Medicine  12/01/19   Jettie Booze, MD PCP - Cardiology Cardiology  01/26/20   Milus Banister, MD Attending Physician Gastroenterology  12/01/19     Chief Complaint  Patient presents with  . Hypertension    pt c/o low bp readings at home range 146-86/ 91-50, and headaches x 1 mos     Subjective: Rita Levine is a 69 y.o. female present for elevated BP Essential hypertension/HLD/Statin declined/Palpitations/Obesity (BMI 30-39.9) Patient reports she has had low blood pressure readings this week and her monitor is saying irregular heartbeat.  She brings with her blood pressure readings since September 5 which were basically all in normal range between 106- 143/52-91, with the exception of 1 significantly low blood pressure of 86/54 on October 4.  She states she did feel dizzy at that time.  She endorses having more palpitations.  She reports she had been compliant with atenolol 25 mg daily and losartan 25 mg daily.  She does admit when her blood pressures are significantly lower than 097 systolic prior to medication she will take a half of a tab of each instead.  She feels she is more fatigue when she takes the full tab of atenolol.  Pt does not daily baby ASA. Pt is prescribed statin.  She is taking Zetia.  Ingrown toenail: Patient reports she has had an ingrown toenail that she has been placing triple antibiotic ointment over and soaking in peroxide or Epson salt soaks.  She is concerned it is become infected and  would like this looked at today.  She denies fevers.  She denies drainage.  Depression screen [facility] 2/9 07/18/2020 12/03/2019  Decreased Interest 0 0  Down, Depressed, Hopeless 0 0  PHQ - 2 Score 0 0    Allergies  Allergen Reactions  . Sulfamethoxazole-Trimethoprim Hives  . Avelox [Moxifloxacin Hcl In Nacl]     Racing heart  . Cephalexin Hives  . Codeine Hives    Heart racing  . Erythromycin Hives  . Flagyl [Metronidazole]     Unknown reaction  . Propranolol Hives  . Sulfa Antibiotics Hives  . Tetanus Toxoid Swelling    Reports a fever and headache with swelling.   . Tramadol Hives  . Statins Other (See Comments)    Myalgia    Social History   Social History Narrative   Marital status/children/pets: married, 2 children   Education/employment: HS grad. retired   Engineer, materials:      -smoke alarm in the home:Yes     - wears seatbelt: Yes     - Feels safe in their relationships: Yes   Past Medical History:  Diagnosis Date  . Allergic rhinitis   . Allergic rhinitis due to pollen 12/03/2019  . Anemia   . Asthma   . Chest pain, unspecified 04/07/2014  . Chicken pox   . Colon polyp   . Diverticulosis   . Dysphagia   . Fibromyalgia   . Food allergy   . Gallstone   . GERD (gastroesophageal reflux disease)   .  Hiatal hernia   . Hypercholesteremia   . Hypertension   . Hypothyroidism   . IBS (irritable bowel syndrome)   . Migraine headache   . Osteoarthritis   . Osteopenia   . Pulmonary hypertension ([place])    pt denies  . Rectal bleeding   . SBO (small bowel obstruction) ([place]) 10/20/2017  . Seasonal allergies   . Thyroid nodule    Past Surgical History:  Procedure Laterality Date  . APPENDECTOMY  1982  . BREAST EXCISIONAL BIOPSY Right 1995   benign  . BREAST LUMPECTOMY  1991  . CARPAL TUNNEL RELEASE Right 2006   Right wrist  . CESAREAN SECTION     x 2  . COLON RESECTION  1990  . COLONOSCOPY    . CT ABDOMEN PELVIS W ([place] HX)  01/03/2020  . IMAGE MRI brain:  04/2012    No focal IAC or inner ear lesion to explain hearing loss. Slightly greater than expected number of subcortical T2- hyperintensities bilaterally.  These are nonspecific.  They can be seen in the setting of chronic migraine headaches, demyelinating process, chronic microvascular ischemic, prior infection or inflammation, or vasculitis  . IMAGE MRI lumbar:  05/01/2006   Mild central canal stenosis with facet arthropathy and mildly bulging disc at L4-5.  There is mild narrowing in the left lateral recess at this level.  No definite neural impingement.  Appearance at this level has not markedly Moderately severe facet arthropathy at L5-S1 with mild interval progression of a diffuse broad based disc bulge.  There is mild biforaminal narrowing.  Central canal is open  . LAPAROSCOPIC ABDOMINAL EXPLORATION  2019   adhesion removal> caused bowel blockage   . NASAL SEPTUM SURGERY  2004  . TONSILLECTOMY  1965  . TOTAL ABDOMINAL HYSTERECTOMY W/ BILATERAL SALPINGOOPHORECTOMY  1988  . TUBAL LIGATION  1974  . UPPER GASTROINTESTINAL ENDOSCOPY  2020   Family History  Problem Relation Age of Onset  . Hyperlipidemia Father   . Hypertension Father   . Arthritis Father   . Diabetes Father   . Asthma Father   . COPD Father   . Early death Father   . Hypertension Mother   . Hyperlipidemia Mother   . Macular degeneration Mother   . Arthritis Mother   . Hearing loss Mother   . Heart disease Mother   . Kidney disease Mother   . Throat cancer Brother        lung, and tongue  . Arthritis Brother   . COPD Brother   . Diabetes type II Sister   . COPD Sister   . Heart disease Sister   . Hypertension Sister   . Lung cancer Sister   . Early death Sister   . COPD Sister   . Breast cancer Maternal Aunt   . Lung cancer Other        uncle  . Emphysema Maternal Aunt   . Emphysema Maternal Uncle   . Clotting disorder Sister   . Breast cancer Cousin   . Cancer Sister   . Alcohol abuse Sister   . COPD Sister    . Early death Sister   . Alcohol abuse Brother   . Arthritis Brother   . Depression Brother   . Diabetes Brother   . Hypercholesterolemia Brother   . Colon cancer Neg Hx   . Esophageal cancer Neg Hx   . Rectal cancer Neg Hx   . Stomach cancer Neg Hx    Allergies as of  07/18/2020      Reactions   Sulfamethoxazole-trimethoprim Hives   Avelox [moxifloxacin Hcl In Nacl]    Racing heart   Cephalexin Hives   Codeine Hives   Heart racing   Erythromycin Hives   Flagyl [metronidazole]    Unknown reaction   Propranolol Hives   Sulfa Antibiotics Hives   Tetanus Toxoid Swelling   Reports a fever and headache with swelling.    Tramadol Hives   Statins Other (See Comments)   Myalgia      Medication List       Accurate as of July 18, 2020 11:59 PM. If you have any questions, ask your nurse or doctor.        STOP taking these medications   predniSONE 20 MG tablet Commonly known as: DELTASONE Stopped by: Howard Pouch, DO     TAKE these medications   albuterol 108 (90 Base) MCG/ACT inhaler Commonly known as: ProAir HFA Inhale 2 puffs into the lungs every 6 (six) hours as needed. What changed: reasons to take this   Allergy Relief 180 MG tablet Generic drug: fexofenadine TAKE ONE TABLET BY MOUTH EVERY DAY What changed: how much to take   [employer]our Thyroid 30 MG tablet Generic drug: thyroid Take 1 tablet (30 mg total) by mouth daily before breakfast. What changed: additional instructions   [employer]our Thyroid 15 MG tablet Generic drug: thyroid Take 1 tablet (15 mg total) by mouth daily. What changed: additional instructions   atenolol 25 MG tablet Commonly known as: TENORMIN Take 1 tablet (25 mg total) by mouth daily.   calcium carbonate 500 MG chewable tablet Commonly known as: TUMS - dosed in mg elemental calcium Chew 3 tablets by mouth daily as needed for indigestion or heartburn.   clobetasol cream 0.05 % Commonly known as: TEMOVATE Apply 1 application topically  2 (two) times daily. What changed: Another medication with the same name was removed. Continue taking this medication, and follow the directions you see here. Changed by: Howard Pouch, DO   CoQ-10 100 MG Caps Take 100 mg by mouth daily. What changed: Another medication with the same name was removed. Continue taking this medication, and follow the directions you see here. Changed by: Howard Pouch, DO   diclofenac 75 MG EC tablet Commonly known as: VOLTAREN TAKE 1 TABLET BY MOUTH TWICE A DAY   doxycycline 100 MG tablet Commonly known as: VIBRA-TABS Take 1 tablet (100 mg total) by mouth 2 (two) times daily.   ezetimibe 10 MG tablet Commonly known as: ZETIA Take 1 tablet (10 mg total) by mouth daily. What changed: when to take this   famotidine 20 MG tablet Commonly known as: PEPCID TAKE 1 TABLET BY MOUTH EVERY DAY What changed: when to take this   fluticasone 50 MCG/ACT nasal spray Commonly known as: FLONASE SPRAY 2 SPRAYS INTO EACH NOSTRIL EVERY DAY What changed: See the new instructions.   hyoscyamine 0.125 MG tablet Commonly known as: LEVSIN Take 1 tablet (0.125 mg total) by mouth every 6 (six) hours as needed. What changed: reasons to take this   levocetirizine 5 MG tablet Commonly known as: XYZAL Take 5 mg by mouth every evening.   losartan 25 MG tablet Commonly known as: COZAAR TAKE 0.5-1 TABLETS (12.5-25 MG TOTAL) BY MOUTH DAILY.   mometasone 0.1 % lotion Commonly known as: ELOCON Apply 1 application topically daily as needed (ear irritation).   montelukast 10 MG tablet Commonly known as: SINGULAIR TAKE 1 TABLET BY MOUTH EVERYDAY AT BEDTIME  What changed: See the new instructions.   ondansetron 8 MG disintegrating tablet Commonly known as: ZOFRAN-ODT Take 1 tablet (8 mg total) by mouth every 8 (eight) hours as needed for nausea or vomiting.   pantoprazole 40 MG tablet Commonly known as: PROTONIX TAKE 1 TABLET BY MOUTH EVERY DAY   Probiotic Caps Take  1 capsule by mouth daily.   rosuvastatin 10 MG tablet Commonly known as: CRESTOR TAKE 1 TABLET ONCE A WEEK FOR 1 WEEK, THEN INCREASE TO TAKING 1 TABLET TWICE A WEEK What changed:   how much to take  how to take this  when to take this  additional instructions   Symbicort 80-4.5 MCG/ACT inhaler Generic drug: budesonide-formoterol INHALE 2 PUFFS INTO THE LUNGS IN THE MORNING AND AT BEDTIME. What changed: See the new instructions.   THERATEARS OP Place 1 drop into both eyes 2 (two) times daily.   tiZANidine 4 MG tablet Commonly known as: ZANAFLEX Take 1 tablet (4 mg total) by mouth every 6 (six) hours as needed for muscle spasms. What changed: when to take this   tretinoin 0.1 % cream Commonly known as: Retin-A Apply topically at bedtime.   VITAMIN A PO Take 1 tablet by mouth daily.   vitamin C 1000 MG tablet Take 3,000 mg by mouth daily.   Vitamin D 125 MCG (5000 UT) Caps Take 5,000 Units by mouth daily.   vitamin E 1000 UNIT capsule Take 1,000 Units by mouth daily.   zinc gluconate 50 MG tablet Take 100 mg by mouth daily.   zolpidem 5 MG tablet Commonly known as: AMBIEN Take 1 tablet (5 mg total) by mouth at bedtime. What changed:   when to take this  reasons to take this       All past medical history, surgical history, allergies, family history, immunizations andmedications were updated in the EMR today and reviewed under the history and medication portions of their EMR.     ROS: Negative, with the exception of above mentioned in HPI   Objective:  BP 128/68   Pulse 87   Temp 98.9 F (37.2 C) (Oral)   Ht 5' (1.524 m)   Wt 132 lb (59.9 kg)   SpO2 97%   BMI 25.78 kg/m  Body mass index is 25.78 kg/m. Gen: Afebrile. No acute distress.  Nontoxic in presentation. HENT: AT. [place].  No cough.  No hoarseness. Eyes:Pupils Equal Round Reactive to light, Extraocular movements intact,  Conjunctiva without redness, discharge or icterus. CV: RRR no murmur,  no edema, +2/4 P posterior tibialis pulses Chest: CTAB, no wheeze or crackles Skin/large toe: no rashes, purpura or petechiae.  Mild erythema medial nail bed fold of large toe.  No drainage. Neuro: Normal gait. PERLA. EOMi. Alert. Oriented x3  Psych: Normal affect, dress and demeanor. Normal speech. Normal thought content and judgment.   No exam data present No results found. No results found for this or any previous visit (from the past 24 hour(s)).  Assessment/Plan: Rita Levine is a 69 y.o. female present for OV for  Essential hypertension/palpitations/hyperlipidemia/aortic atherosclerosis Leg pressure is stable today however she has not taken her blood pressure regimen in 2 days.  She does however endorse increased palpitations even prior to stopping her atenolol over the last 2 days.  Encouraged her to restart atenolol 12.5 mg (1/2 tab) to help control palpitations and advised her to follow-up with her cardiologist for further evaluation.  Did place referral back to her cardiology team to generate phone  call to get her scheduled. Patient reports understanding. Recheck TSH and BMP  Ingrown toenail: Short course of doxycycline prescribed.  Does not look grossly infected today but there is some mild redness.  No drainage.  Referral to podiatry placed  (Documentation only) Sleep disturbance Refilled ambien for her.  Osteoarthritis, unspecified osteoarthritis type, unspecified site Continue voltaren BID    Reviewed expectations re: course of current medical issues.  Discussed self-management of symptoms.  Outlined signs and symptoms indicating need for more acute intervention.  Patient verbalized understanding and all questions were answered.  Patient received an After-Visit Summary.    Orders Placed This Encounter  Procedures  . Basic Metabolic Panel (BMET)  . TSH  . CBC w/Diff  . Ambulatory referral to Podiatry  . Ambulatory referral to Cardiology   Meds ordered  this encounter  Medications  . doxycycline (VIBRA-TABS) 100 MG tablet    Sig: Take 1 tablet (100 mg total) by mouth 2 (two) times daily.    Dispense:  10 tablet    Refill:  0    Referral Orders     Ambulatory referral to Podiatry     Ambulatory referral to Cardiology   Note is dictated utilizing voice recognition software. Although note has been proof read prior to signing, occasional typographical errors still can be missed. If any questions arise, please do not hesitate to call for verification.   electronically signed by:  Howard Pouch, DO  [place]

## 2020-07-18 NOTE — Patient Instructions (Signed)
We will call you with labs and refer you to cardiology for further eval.  Hold losartan unless BP > 135 on top number. Then only take 1/2 tab   Restart atenolol 1/2 tab only.  Referred to podiatry for foot and called in short course of doxycyline. Keep covered and triple antibiotic ointment. Epson salt soaks are helpful.     Please help [place] help you:  We are honored you have chosen [facility] for your Primary Care home. Below you will find basic instructions that you may need to access in the future. Please help [place] help you by reading the instructions, which cover many of the frequent questions we experience.   Prescription refills and request:  -In order to allow more efficient response time, please call your pharmacy for all refills. They will forward the request electronically to [place]. This allows for the quickest possible response. Request left on a nurse line can take longer to refill, since these are checked as time allows between office patients and other phone calls.  - refill request can take up to 3-5 working days to complete.  - If request is sent electronically and request is appropiate, it is usually completed in 1-2 business days.  - all patients will need to be seen routinely for all chronic medical conditions requiring prescription medications (see follow-up below). If you are overdue for follow up on your condition, you will be asked to make an appointment and we will call in enough medication to cover you until your appointment (up to 30 days).  - all controlled substances will require a face to face visit to request/refill.  - if you desire your prescriptions to go through a new pharmacy, and have an active script at original pharmacy, you will need to call your pharmacy and have scripts transferred to new pharmacy. This is completed between the pharmacy locations and not by your provider.    Results: Our office handles many outgoing and incoming calls daily. If we have not  contacted you within 1 week about your results, please check your mychart to see if there is a message first and if not, then contact our office.  In helping with this matter, you help decrease call volume, and therefore allow [place] to be able to respond to patients needs more efficiently.  We will always attempt to call you with results,  normal or abnormal. However, if we are unable to reach you we will send a message in your my chart with results.   Acute office visits (sick visit):  An acute visit is intended for a new problem and are scheduled in shorter time slots to allow schedule openings for patients with new problems. This is the appropriate visit to discuss a new problem. Problems will not be addressed by phone call or Echart message. Appointment is needed if requesting treatment. In order to provide you with excellent quality medical care with proper time for you to explain your problem, have an exam and receive treatment with instructions, these appointments should be limited to one new problem per visit. If you experience a new problem, in which you desire to be addressed, please make an acute office visit, we save openings on the schedule to accommodate you. Please do not save your new problem for any other type of visit, let us take care of it properly and quickly for you.   Follow up visits:  Depending on your condition(s) your provider will need to see you routinely in  order to provide you with quality care and prescribe medication(s). Most chronic conditions (Example: hypertension, Diabetes, depression/anxiety... etc), require visits a couple times a year. Your provider will instruct you on proper follow up for your personal medical conditions and history. Please make certain to make follow up appointments for your condition as instructed. Failing to do so could result in lapse in your medication treatment/refills. If you request a refill, and are overdue to be seen on a condition, we will  always provide you with a 30 day script (once) to allow you time to schedule.    Medicare wellness (well visit): - we have a wonderful Nurse Maudie Mercury), that will meet with you and provide you will yearly medicare wellness visits. These visits should occur yearly (can not be scheduled less than 1 calendar year apart) and cover preventive health, immunizations, advance directives and screenings you are entitled to yearly through your medicare benefits. Do not miss out on your entitled benefits, this is when medicare will pay for these benefits to be ordered for you.  These are strongly encouraged by your provider and is the appropriate type of visit to make certain you are up to date with all preventive health benefits. If you have not had your medicare wellness exam in the last 12 months, please make certain to schedule one by calling the office and schedule your medicare wellness with Maudie Mercury as soon as possible.   Yearly physical (well visit):  - Adults are recommended to be seen yearly for physicals. Check with your insurance and date of your last physical, most insurances require one calendar year between physicals. Physicals include all preventive health topics, screenings, medical exam and labs that are appropriate for gender/age and history. You may have fasting labs needed at this visit. This is a well visit (not a sick visit), new problems should not be covered during this visit (see acute visit).  - Pediatric patients are seen more frequently when they are younger. Your provider will advise you on well child visit timing that is appropriate for your their age. - This is not a medicare wellness visit. Medicare wellness exams do not have an exam portion to the visit. Some medicare companies allow for a physical, some do not allow a yearly physical. If your medicare allows a yearly physical you can schedule the medicare wellness with our nurse Maudie Mercury and have your physical with your provider after, on the same  day. Please check with insurance for your full benefits.   Late Policy/No Shows:  - all new patients should arrive 15-30 minutes earlier than appointment to allow [place] time  to  obtain all personal demographics,  insurance information and for you to complete office paperwork. - All established patients should arrive 10-15 minutes earlier than appointment time to update all information and be checked in .  - In our best efforts to run on time, if you are late for your appointment you will be asked to either reschedule or if able, we will work you back into the schedule. There will be a wait time to work you back in the schedule,  depending on availability.  - If you are unable to make it to your appointment as scheduled, please call 24 hours ahead of time to allow [place] to fill the time slot with someone else who needs to be seen. If you do not cancel your appointment ahead of time, you may be charged a no show fee.

## 2020-07-19 ENCOUNTER — Other Ambulatory Visit: Payer: Self-pay | Admitting: General Surgery

## 2020-07-19 ENCOUNTER — Encounter: Payer: Self-pay | Admitting: Family Medicine

## 2020-07-20 ENCOUNTER — Other Ambulatory Visit: Payer: Self-pay | Admitting: Family Medicine

## 2020-07-20 DIAGNOSIS — Z1231 Encounter for screening mammogram for malignant neoplasm of breast: Secondary | ICD-10-CM

## 2020-07-20 NOTE — Pre-Procedure Instructions (Signed)
Rita Levine  07/20/2020    Your procedure is scheduled on Tuesday, July 25, 2020 at 1:00 PM.   Report to [facility] Entrance "A" Admitting Office at 11:00 AM.   Call this number if you have problems the morning of surgery: [phone]   Questions prior to day of surgery, please call [phone] between 8 & 4 PM.   Remember:  Do not eat food after midnight Monday, 07/24/20.  You may drink clear liquids until 10:00 AM.  Clear liquids allowed are: Water, Clear tea, black coffee, Gatorade, clear juices (non-pulp or citrus), carbonated beverages    Take these medicines the morning of surgery with A SIP OF WATER: Armour Thyroid, Atenolol (Tenormin), Pantoprazole (Protonix), Symbicort inhaler, Eye drops, Flonase, Ondansetron (Zofran) - if needed, Albuterol inhaler - if needed (bring this inhaler with you day of surgery)  Continue to hold Vitamins, CoQ10, Probiotic prior to surgery. Stop NSAIDS (Diclofenac, Voltaren, Ibuprofen, Aleve, etc) as of today prior to surgery. Do not use Aspirin containing products or Herbal medications prior to surgery.    Do not wear jewelry, make-up or nail polish.  Do not wear lotions, powders, perfumes or deodorant.  Do not shave 48 hours prior to surgery.    Do not bring valuables to the hospital.  [facility] is not responsible for any belongings or valuables.  Contacts, dentures or bridgework may not be worn into surgery.  Leave your suitcase in the car.  After surgery it may be brought to your room.  For patients admitted to the hospital, discharge time will be determined by your treatment team.  Patients discharged the day of surgery will not be allowed to drive home.   [place] [facility] - Preparing for Surgery  Before surgery, you can play an important role.  Because skin is not sterile, your skin needs to be as free of germs as possible.  You can reduce the number of germs on you skin by washing with CHG (chlorahexidine gluconate) soap before  surgery.  CHG is an antiseptic cleaner which kills germs and bonds with the skin to continue killing germs even after washing.  Oral Hygiene is also important in reducing the risk of infection.  Remember to brush your teeth with your regular toothpaste the morning of surgery.  Please DO NOT use if you have an allergy to CHG or antibacterial soaps.  If your skin becomes reddened/irritated stop using the CHG and inform your nurse when you arrive at Short Stay.  Do not shave (including legs and underarms) for at least 48 hours prior to the first CHG shower.  You may shave your face.  Please follow these instructions carefully:   1.  Shower with CHG Soap the night before surgery and the morning of Surgery.  2.  If you choose to wash your hair, wash your hair first as usual with your normal shampoo.  3.  After you shampoo, rinse your hair and body thoroughly to remove the shampoo. 4.  Use CHG as you would any other liquid soap.  You can apply chg directly to the skin and wash gently with a      scrungie or washcloth.           5.  Apply the CHG Soap to your body ONLY FROM THE NECK DOWN.   Do not use on open wounds or open sores. Avoid contact with your eyes, ears, mouth and genitals (private parts).  Wash genitals (private parts) with your normal soap.  6.  Wash thoroughly, paying special attention to the area where your surgery will be performed.  7.  Thoroughly rinse your body with warm water from the neck down.  8.  DO NOT shower/wash with your normal soap after using and rinsing off the CHG Soap - do this prior to using CHG soap.  9.  Pat yourself dry with a clean towel.            10.  Wear clean pajamas.            11.  Place clean sheets on your bed the night of your first shower and do not sleep with pets.  Day of Surgery  Shower as above. Do not apply any lotions/deodorants the morning of surgery.   Please wear clean clothes to the hospital. Remember to brush your teeth with  toothpaste.   Please read over the fact sheets that you were given.

## 2020-07-21 ENCOUNTER — Encounter (HOSPITAL_COMMUNITY): Payer: Self-pay

## 2020-07-21 ENCOUNTER — Other Ambulatory Visit (HOSPITAL_COMMUNITY)
Admission: RE | Admit: 2020-07-21 | Discharge: 2020-07-21 | Disposition: A | Discharge status: Home / Self Care | Payer: Medicare HMO | Source: Ambulatory Visit | Attending: General Surgery | Admitting: General Surgery

## 2020-07-21 ENCOUNTER — Other Ambulatory Visit: Payer: Self-pay

## 2020-07-21 ENCOUNTER — Encounter (HOSPITAL_COMMUNITY)
Admission: RE | Admit: 2020-07-21 | Discharge: 2020-07-21 | Disposition: A | Discharge status: Home / Self Care | Payer: Medicare HMO | Source: Ambulatory Visit | Attending: General Surgery | Admitting: General Surgery

## 2020-07-21 DIAGNOSIS — Z20822 Contact with and (suspected) exposure to covid-19: Secondary | ICD-10-CM | POA: Insufficient documentation

## 2020-07-21 DIAGNOSIS — Z01812 Encounter for preprocedural laboratory examination: Secondary | ICD-10-CM | POA: Insufficient documentation

## 2020-07-21 HISTORY — DX: Pneumonia, unspecified organism: J18.9

## 2020-07-21 HISTORY — DX: Other specified postprocedural states: Z98.890

## 2020-07-21 HISTORY — DX: Cardiac arrhythmia, unspecified: I49.9

## 2020-07-21 HISTORY — DX: Nausea with vomiting, unspecified: R11.2

## 2020-07-21 HISTORY — DX: Other specified postprocedural states: R11.2

## 2020-07-21 LAB — COMPREHENSIVE METABOLIC PANEL
ALT: 18 U/L (ref 0–44)
AST: 20 U/L (ref 15–41)
Albumin: 3.4 g/dL — ABNORMAL LOW (ref 3.5–5.0)
Alkaline Phosphatase: 65 U/L (ref 38–126)
Anion gap: 7 (ref 5–15)
BUN: 9 mg/dL (ref 8–23)
CO2: 26 mmol/L (ref 22–32)
Calcium: 9.3 mg/dL (ref 8.9–10.3)
Chloride: 107 mmol/L (ref 98–111)
Creatinine, Ser: 0.67 mg/dL (ref 0.44–1.00)
GFR calc non Af Amer: >60 mL/min (ref >60)
Glucose, Bld: 97 mg/dL (ref 70–99)
Potassium: 3.9 mmol/L (ref 3.5–5.1)
Sodium: 140 mmol/L (ref 135–145)
Total Bilirubin: 0.2 mg/dL — ABNORMAL LOW (ref 0.3–1.2)
Total Protein: 6.2 g/dL — ABNORMAL LOW (ref 6.5–8.1)

## 2020-07-21 LAB — SARS CORONAVIRUS 2 (TAT 6-24 HRS): SARS Coronavirus 2: NEGATIVE

## 2020-07-21 LAB — PROTIME-INR
INR: 1 (ref 0.8–1.2)
Prothrombin Time: 13 seconds (ref 11.4–15.2)

## 2020-07-21 NOTE — Progress Notes (Addendum)
PCP - Dr. Howard Pouch Cardiologist - Dr. Irish Lack  EKG - 02/03/20 ECHO - 2005   ERAS Protcol - yes, no drink ordered  Pt had blood work done 07/18/20. Did not repeat the CBC with diff today   COVID TEST- 07/21/20, result pending   Anesthesia review: Yes. Pt had issues with her BP being low for a few days, didn't take her meds. Saw Dr. Raoul Pitch and she decreased her Atenolol. She also complained of palpitations and Dr. Raoul Pitch is having her follow up with Dr. Irish Lack. She has an appt on 08/28/20 with Dr. Irish Lack  Patient denies shortness of breath, fever, cough and chest pain at PAT appointment   All instructions explained to the patient, with a verbal understanding of the material. Patient agrees to go over the instructions while at home for a better understanding. Patient also instructed to self quarantine after being tested for COVID-19. The opportunity to ask questions was provided.

## 2020-07-24 NOTE — H&P (Signed)
Rita Levine Location: [facility] Patient #: [id number] DOB: 1951/07/21 Married / Language: English / Race: White Female   History of Present Illness The patient is a 69 year old female who presents for evaluation of gall stones. Pt is a 69 yo F who has a long history of abdominal pain. She has required colon resection for bad diverticulitis and has h/o SBO that resolved without surgery. She presents with new episodes of RUQ/upper abdominal pain. She was told she had gallbladder issues several years ago, but changed her diet because she was concerned about more adhesions and more SBOs. This time she had three severe episodes in a short period of time and desires surgery.   She reports sharp RUQ pain migrating the the epigastrium in the late evening around 2 hours after she eats. She eats healthy foods, no nothing fried or greasy. The pain is severe enough that she has mild nausea and has to stop what she is doing and allow it to pass. She denies change in bowel habits. She alternates constipation and diarrhea.    Of note, I did a distal pancreatectomy on her husband around 10 years ago.   CT 01/03/2020  IMPRESSION: 1. No acute abdominal/pelvic findings, mass lesions or lymphadenopathy. 2. Diffuse colonic diverticulosis without findings for acute diverticulitis. 3. Advanced atherosclerotic calcifications involving the aorta and iliac arteries for the patient's age. 4. Layering gallbladder sludge versus small gallstones. 5. Status post hysterectomy and appendectomy.  Aortic Atherosclerosis (ICD10-I70.0).   Past Surgical History Appendectomy  Breast Biopsy  Right. Cesarean Section - Multiple  Colon Polyp Removal - Colonoscopy  Colon Removal - Partial  Hysterectomy (not due to cancer) - Complete  Resection of Small Bowel  Tonsillectomy   Diagnostic Studies History Colonoscopy  1-5 years ago Mammogram  within last year Pap Smear  >5 years  ago  Allergies  Sulfamethizole *SULFONAMIDES*  Avelox *FLUOROQUINOLONES*  Cephalexin *CEPHALOSPORINS*  Codeine Sulfate *ANALGESICS - OPIOID*  Erythromycin *DERMATOLOGICALS*  Flagyl *ANTI-INFECTIVE AGENTS - MISC.*  Septra *ANTI-INFECTIVE AGENTS - MISC.*  Sulfa 10 *OPHTHALMIC AGENTS*  Tetanus-Diphtheria Toxoids Td *TOXOIDS*  traMADol HCl *ANALGESICS - OPIOID*  Statins Depletion *DIETARY PRODUCTS/DIETARY MANAGEMENT PRODUCTS*  Fibromyalgia Symptom Relief *MISCELLANEOUS THERAPEUTIC CLASSES*   Medication History Atenolol (25MG  Tablet, Oral) Active. Benzonatate (100MG  Capsule, Oral) Active. Budesonide-Formoterol Fumarate (80-4.5MCG/ACT Aerosol, Inhalation) Active. Diclofenac Sodium (75MG  Tablet DR, Oral) Active. Famotidine (20MG  Tablet, Oral) Active. Fluticasone Propionate (50MCG/ACT Suspension, Nasal) Active. Hyoscyamine Sulfate (0.125MG  Tablet, Oral) Active. Levocetirizine Dihydrochloride (5MG  Tablet, Oral) Active. Montelukast Sodium (10MG  Tablet, Oral) Active. Ondansetron (8MG  Tablet Disint, Oral) Active. Pantoprazole Sodium (40MG  Tablet DR, Oral) Active. Armour Thyroid (15MG  Tablet, Oral) Active. Armour Thyroid (30MG  Tablet, Oral) Active. tiZANidine HCl (4MG  Tablet, Oral) Active. Rosuvastatin Calcium (10MG  Tablet, Oral) Active. Medications Reconciled  Social History Alcohol use  Occasional alcohol use. Caffeine use  Coffee. No drug use  Tobacco use  Never smoker.  Family History Arthritis  Brother, Father, Mother, Sister. Cancer  Brother. Diabetes Mellitus  Father, Sister. Hypertension  Father, Mother, Sister. Melanoma  Sister. Respiratory Condition  Brother, Father, Sister. Thyroid problems  Sister.  Pregnancy / Birth History Age at menarche  48 years. Age of menopause  <45 Contraceptive History  Oral contraceptives. Gravida  2 Maternal age  76-20 Para  2  Other Problems Arthritis  Asthma  Back Pain  Chest pain   Cholelithiasis  Diverticulosis  Gastroesophageal Reflux Disease  General anesthesia - complications  Hemorrhoids  High blood pressure  Hypercholesterolemia  Lump In Breast  Migraine Headache  Oophorectomy  Bilateral. Other disease, cancer, significant illness  Thyroid Disease  Vascular Disease     Review of Systems General Present- Chills and Fatigue. Not Present- Appetite Loss, Fever, Night Sweats, Weight Gain and Weight Loss. Skin Present- Change in Wart/Mole. Not Present- Dryness, Hives, Jaundice, New Lesions, Non-Healing Wounds, Rash and Ulcer. HEENT Present- Hoarseness, Oral Ulcers, Ringing in the Ears, Seasonal Allergies and Wears glasses/contact lenses. Not Present- Earache, Hearing Loss, Nose Bleed, Sinus Pain, Sore Throat, Visual Disturbances and Yellow Eyes. Breast Not Present- Breast Mass, Breast Pain, Nipple Discharge and Skin Changes. Cardiovascular Present- Difficulty Breathing Lying Down, Palpitations and Shortness of Breath. Not Present- Chest Pain, Leg Cramps, Rapid Heart Rate and Swelling of Extremities. Gastrointestinal Present- Abdominal Pain, Bloating and Nausea. Not Present- Bloody Stool, Change in Bowel Habits, Chronic diarrhea, Constipation, Difficulty Swallowing, Excessive gas, Gets full quickly at meals, Hemorrhoids, Indigestion, Rectal Pain and Vomiting. Female Genitourinary Present- Nocturia and Urgency. Not Present- Frequency, Painful Urination and Pelvic Pain. Musculoskeletal Present- Back Pain, Joint Pain, Joint Stiffness, Muscle Pain and Muscle Weakness. Not Present- Swelling of Extremities. Neurological Present- Headaches and Weakness. Not Present- Decreased Memory, Fainting, Numbness, Seizures, Tingling, Tremor and Trouble walking. Psychiatric Not Present- Anxiety, Bipolar, Change in Sleep Pattern, Depression, Fearful and Frequent crying. Endocrine Present- Cold Intolerance, Excessive Hunger, Heat Intolerance and Hot flashes. Not Present-  Hair Changes and New Diabetes. Hematology Present- Easy Bruising and Gland problems. Not Present- Blood Thinners, Excessive bleeding, HIV and Persistent Infections.  Vitals  Weight: 131.6 lb Height: 58in Body Surface Area: 1.52 m Body Mass Index: 27.5 kg/m  Temp.: 56F (Tympanic)  Pulse: 74 (Regular)  BP: 134/82(Sitting, Left Arm, Standard)       Physical Exam  General Mental Status-Alert. General Appearance-Consistent with stated age. Hydration-Well hydrated. Voice-Normal.  Head and Neck Head-normocephalic, atraumatic with no lesions or palpable masses. Trachea-midline. Thyroid Gland Characteristics - normal size and consistency.  Eye Eyeball - Bilateral-Extraocular movements intact. Sclera/Conjunctiva - Bilateral-No scleral icterus.  Chest and Lung Exam Chest and lung exam reveals -quiet, even and easy respiratory effort with no use of accessory muscles and on auscultation, normal breath sounds, no adventitious sounds and normal vocal resonance. Inspection Chest Wall - Normal. Back - normal.  Cardiovascular Cardiovascular examination reveals -normal heart sounds, regular rate and rhythm with no murmurs and normal pedal pulses bilaterally.  Abdomen Inspection Inspection of the abdomen reveals - No Hernias. Palpation/Percussion Palpation and Percussion of the abdomen reveal - Soft, No Rebound tenderness, No Rigidity (guarding) and No hepatosplenomegaly. Note: tender RUQ. Auscultation Auscultation of the abdomen reveals - Bowel sounds normal.  Neurologic Neurologic evaluation reveals -alert and oriented x 3 with no impairment of recent or remote memory. Mental Status-Normal.  Musculoskeletal Global Assessment -Note: no gross deformities.  Normal Exam - Left-Upper Extremity Strength Normal and Lower Extremity Strength Normal. Normal Exam - Right-Upper Extremity Strength Normal and Lower Extremity Strength  Normal.  Lymphatic Head & Neck  General Head & Neck Lymphatics: Bilateral - Description - Normal. Axillary  General Axillary Region: Bilateral - Description - Normal. Tenderness - Non Tender. Femoral & Inguinal  Generalized Femoral & Inguinal Lymphatics: Bilateral - Description - No Generalized lymphadenopathy.    Assessment & Plan CHRONIC CHOLECYSTITIS (K81.1) Impression: Pt has imaging c/w sludge and symptoms c/w chronic cholecystitis.  Will plan lap chole.  The surgical procedure was described to the patient in detail. The patient was given educational material. I discussed the incision type and  location, the location of the gallbladder, the anatomy of the bile ducts and arteries, and the typical progression of surgery. I discussed the possibility of converting to an open operation. I advised of the risks of bleeding, infection, damage to other structures (such as the bile duct, intestine or liver), bile leak, need for other procedures or surgeries, and post op diarrhea/constipation. We discussed the risk of blood clot. We discussed the recovery period and post operative restrictions. The patient was advised against taking blood thinners the week before surgery. Current Plans You are being scheduled for surgery- Our schedulers will call you.  You should hear from our office's scheduling department within 5 working days about the location, date, and time of surgery. We try to make accommodations for patient's preferences in scheduling surgery, but sometimes the OR schedule or the surgeon's schedule prevents [place] from making those accommodations.  If you have not heard from our office [phone]) in 5 working days, call the office and ask for your surgeon's nurse.  If you have other questions about your diagnosis, plan, or surgery, call the office and ask for your surgeon's nurse.  Pt Education - Laparoscopic Cholecystectomy: gallbladder

## 2020-07-24 NOTE — Progress Notes (Signed)
Anesthesia Chart Review:  Recently referred to cardiology by PCP for evaluation/management of aortic atherosclerosis and hyperlipidemia as well as preoperative evaluation.  Seen by Dr. Irish Lack 02/03/2020.  Per note, "Preoperative eval: No further cardiac testing needed given that she is very physically active an has no sx."  Preop labs reviewed, unremarkable.  EKG 02/03/2020: Sinus rhythm.  Rate 64.  Exercise tolerance test 05/11/2014: Comments:  The patient had a moderate exercise tolerance. There was no  chest pain. There was an increased level of dyspnea. There were  no arrhythmias, a normal heart rate response and accelerated BP  response. There were no ischemic ST T wave changes.   Recommendations:  Negative adequate ETT. Hypertensive BP response. Duke score 6.   Wynonia Musty [facility] Short Stay Center/Anesthesiology Phone [phone] 07/24/2020 10:06 AM

## 2020-07-24 NOTE — Anesthesia Preprocedure Evaluation (Addendum)
Anesthesia Evaluation  Patient identified by MRN, date of birth, ID band Patient awake    Reviewed: Allergy & Precautions, NPO status , Patient's Chart, lab work & pertinent test results  History of Anesthesia Complications (+) PONV and history of anesthetic complications  Airway Mallampati: II  TM Distance: >3 FB Neck ROM: Full    Dental  (+) Dental Advisory Given, Teeth Intact   Pulmonary asthma ,    Pulmonary exam normal        Cardiovascular hypertension, Pt. on medications and Pt. on home beta blockers Normal cardiovascular exam     Neuro/Psych  Headaches, negative psych ROS   GI/Hepatic Neg liver ROS, hiatal hernia, GERD  Medicated and Controlled, IBS    Endo/Other  Hypothyroidism   Renal/GU negative Renal ROS     Musculoskeletal  (+) Arthritis , Fibromyalgia -  Abdominal   Peds  Hematology negative hematology ROS (+)   Anesthesia Other Findings Covid test negative   Reproductive/Obstetrics                           Anesthesia Physical Anesthesia Plan  ASA: II  Anesthesia Plan: General   Post-op Pain Management:    Induction: Intravenous  PONV Risk Score and Plan: 4 or greater and Treatment may vary due to age or medical condition, Ondansetron, Dexamethasone and Propofol infusion  Airway Management Planned: Oral ETT  Additional Equipment: None  Intra-op Plan:   Post-operative Plan: Extubation in OR  Informed Consent: I have reviewed the patients History and Physical, chart, labs and discussed the procedure including the risks, benefits and alternatives for the proposed anesthesia with the patient or authorized representative who has indicated his/her understanding and acceptance.     Dental advisory given  Plan Discussed with: CRNA and Anesthesiologist  Anesthesia Plan Comments:       Anesthesia Quick Evaluation

## 2020-07-25 ENCOUNTER — Ambulatory Visit (HOSPITAL_COMMUNITY): Payer: Medicare HMO | Admitting: Anesthesiology

## 2020-07-25 ENCOUNTER — Encounter (HOSPITAL_COMMUNITY)
Admission: RE | Disposition: A | Discharge status: Home / Self Care | Payer: Self-pay | Source: Home / Self Care | Attending: General Surgery

## 2020-07-25 ENCOUNTER — Ambulatory Visit (HOSPITAL_COMMUNITY)
Admission: RE | Admit: 2020-07-25 | Discharge: 2020-07-25 | Disposition: A | Discharge status: Home / Self Care | Payer: Medicare HMO | Attending: General Surgery | Admitting: General Surgery

## 2020-07-25 ENCOUNTER — Encounter (HOSPITAL_COMMUNITY): Payer: Self-pay | Admitting: General Surgery

## 2020-07-25 ENCOUNTER — Other Ambulatory Visit: Payer: Self-pay

## 2020-07-25 ENCOUNTER — Ambulatory Visit (HOSPITAL_COMMUNITY): Payer: Medicare HMO | Admitting: Physician Assistant

## 2020-07-25 DIAGNOSIS — M1990 Unspecified osteoarthritis, unspecified site: Secondary | ICD-10-CM | POA: Insufficient documentation

## 2020-07-25 DIAGNOSIS — K5900 Constipation, unspecified: Secondary | ICD-10-CM | POA: Diagnosis not present

## 2020-07-25 DIAGNOSIS — Z79899 Other long term (current) drug therapy: Secondary | ICD-10-CM | POA: Diagnosis not present

## 2020-07-25 DIAGNOSIS — K5730 Diverticulosis of large intestine without perforation or abscess without bleeding: Secondary | ICD-10-CM | POA: Diagnosis not present

## 2020-07-25 DIAGNOSIS — Z887 Allergy status to serum and vaccine status: Secondary | ICD-10-CM | POA: Diagnosis not present

## 2020-07-25 DIAGNOSIS — Z86010 Personal history of colonic polyps: Secondary | ICD-10-CM | POA: Insufficient documentation

## 2020-07-25 DIAGNOSIS — K828 Other specified diseases of gallbladder: Secondary | ICD-10-CM | POA: Diagnosis not present

## 2020-07-25 DIAGNOSIS — Z8261 Family history of arthritis: Secondary | ICD-10-CM | POA: Insufficient documentation

## 2020-07-25 DIAGNOSIS — Z882 Allergy status to sulfonamides status: Secondary | ICD-10-CM | POA: Diagnosis not present

## 2020-07-25 DIAGNOSIS — Z886 Allergy status to analgesic agent status: Secondary | ICD-10-CM | POA: Insufficient documentation

## 2020-07-25 DIAGNOSIS — I700 Atherosclerosis of aorta: Secondary | ICD-10-CM | POA: Diagnosis not present

## 2020-07-25 DIAGNOSIS — Z888 Allergy status to other drugs, medicaments and biological substances status: Secondary | ICD-10-CM | POA: Insufficient documentation

## 2020-07-25 DIAGNOSIS — K449 Diaphragmatic hernia without obstruction or gangrene: Secondary | ICD-10-CM | POA: Diagnosis not present

## 2020-07-25 DIAGNOSIS — I10 Essential (primary) hypertension: Secondary | ICD-10-CM | POA: Insufficient documentation

## 2020-07-25 DIAGNOSIS — Z8249 Family history of ischemic heart disease and other diseases of the circulatory system: Secondary | ICD-10-CM | POA: Insufficient documentation

## 2020-07-25 DIAGNOSIS — E782 Mixed hyperlipidemia: Secondary | ICD-10-CM | POA: Diagnosis not present

## 2020-07-25 DIAGNOSIS — R197 Diarrhea, unspecified: Secondary | ICD-10-CM | POA: Diagnosis not present

## 2020-07-25 DIAGNOSIS — E039 Hypothyroidism, unspecified: Secondary | ICD-10-CM | POA: Insufficient documentation

## 2020-07-25 DIAGNOSIS — Z90710 Acquired absence of both cervix and uterus: Secondary | ICD-10-CM | POA: Insufficient documentation

## 2020-07-25 DIAGNOSIS — K8010 Calculus of gallbladder with chronic cholecystitis without obstruction: Secondary | ICD-10-CM | POA: Diagnosis not present

## 2020-07-25 DIAGNOSIS — G43909 Migraine, unspecified, not intractable, without status migrainosus: Secondary | ICD-10-CM | POA: Insufficient documentation

## 2020-07-25 DIAGNOSIS — K811 Chronic cholecystitis: Secondary | ICD-10-CM | POA: Diagnosis not present

## 2020-07-25 DIAGNOSIS — M797 Fibromyalgia: Secondary | ICD-10-CM | POA: Diagnosis not present

## 2020-07-25 DIAGNOSIS — K589 Irritable bowel syndrome without diarrhea: Secondary | ICD-10-CM | POA: Insufficient documentation

## 2020-07-25 DIAGNOSIS — Z881 Allergy status to other antibiotic agents status: Secondary | ICD-10-CM | POA: Insufficient documentation

## 2020-07-25 DIAGNOSIS — Z836 Family history of other diseases of the respiratory system: Secondary | ICD-10-CM | POA: Insufficient documentation

## 2020-07-25 DIAGNOSIS — E7800 Pure hypercholesterolemia, unspecified: Secondary | ICD-10-CM | POA: Diagnosis not present

## 2020-07-25 DIAGNOSIS — Z808 Family history of malignant neoplasm of other organs or systems: Secondary | ICD-10-CM | POA: Insufficient documentation

## 2020-07-25 DIAGNOSIS — K219 Gastro-esophageal reflux disease without esophagitis: Secondary | ICD-10-CM | POA: Diagnosis not present

## 2020-07-25 DIAGNOSIS — Z809 Family history of malignant neoplasm, unspecified: Secondary | ICD-10-CM | POA: Insufficient documentation

## 2020-07-25 DIAGNOSIS — Z833 Family history of diabetes mellitus: Secondary | ICD-10-CM | POA: Insufficient documentation

## 2020-07-25 DIAGNOSIS — Z885 Allergy status to narcotic agent status: Secondary | ICD-10-CM | POA: Diagnosis not present

## 2020-07-25 DIAGNOSIS — J45909 Unspecified asthma, uncomplicated: Secondary | ICD-10-CM | POA: Insufficient documentation

## 2020-07-25 DIAGNOSIS — Z8349 Family history of other endocrine, nutritional and metabolic diseases: Secondary | ICD-10-CM | POA: Insufficient documentation

## 2020-07-25 HISTORY — PX: CHOLECYSTECTOMY: SHX55

## 2020-07-25 SURGERY — LAPAROSCOPIC CHOLECYSTECTOMY
Anesthesia: General | Site: Abdomen

## 2020-07-25 MED ORDER — ONDANSETRON HCL 4 MG/2ML IJ SOLN
INTRAMUSCULAR | Status: AC
  Filled 2020-07-25: qty 2

## 2020-07-25 MED ORDER — BUPIVACAINE LIPOSOME 1.3 % IJ SUSP
INTRAMUSCULAR | Status: DC | PRN
  Administered 2020-07-25: 20 mL

## 2020-07-25 MED ORDER — LIDOCAINE-EPINEPHRINE 1 %-1:100000 IJ SOLN
INTRAMUSCULAR | Status: AC
  Filled 2020-07-25: qty 1

## 2020-07-25 MED ORDER — BUPIVACAINE HCL (PF) 0.25 % IJ SOLN
INTRAMUSCULAR | Status: AC
  Filled 2020-07-25: qty 30

## 2020-07-25 MED ORDER — FENTANYL CITRATE (PF) 250 MCG/5ML IJ SOLN
INTRAMUSCULAR | Status: DC | PRN
Start: 2020-07-25 — End: 2020-07-25
  Administered 2020-07-25: 100 ug via INTRAVENOUS
  Administered 2020-07-25 (×3): 50 ug via INTRAVENOUS

## 2020-07-25 MED ORDER — ONDANSETRON HCL 4 MG/2ML IJ SOLN: 4.0000 mg | Freq: Once | INTRAMUSCULAR | Status: DC | PRN

## 2020-07-25 MED ORDER — LIDOCAINE 2% (20 MG/ML) 5 ML SYRINGE
INTRAMUSCULAR | Status: DC | PRN
  Administered 2020-07-25: 60 mg via INTRAVENOUS

## 2020-07-25 MED ORDER — LACTATED RINGERS IV SOLN: INTRAVENOUS | Status: DC

## 2020-07-25 MED ORDER — EPHEDRINE 5 MG/ML INJ
INTRAVENOUS | Status: AC
  Filled 2020-07-25: qty 10

## 2020-07-25 MED ORDER — CHLORHEXIDINE GLUCONATE 0.12 % MT SOLN
OROMUCOSAL | Status: AC
  Administered 2020-07-25: 15 mL
  Filled 2020-07-25: qty 15

## 2020-07-25 MED ORDER — FENTANYL CITRATE (PF) 100 MCG/2ML IJ SOLN
25.0000 ug | INTRAMUSCULAR | Status: DC | PRN
  Administered 2020-07-25 (×2): 50 ug via INTRAVENOUS

## 2020-07-25 MED ORDER — ROCURONIUM BROMIDE 10 MG/ML (PF) SYRINGE
PREFILLED_SYRINGE | INTRAVENOUS | Status: AC
  Filled 2020-07-25: qty 20

## 2020-07-25 MED ORDER — MIDAZOLAM HCL 2 MG/2ML IJ SOLN
INTRAMUSCULAR | Status: DC | PRN
  Administered 2020-07-25 (×2): 1 mg via INTRAVENOUS

## 2020-07-25 MED ORDER — PHENYLEPHRINE HCL-NACL 10-0.9 MG/250ML-% IV SOLN
INTRAVENOUS | Status: DC | PRN
  Administered 2020-07-25: 25 ug/min via INTRAVENOUS

## 2020-07-25 MED ORDER — IBUPROFEN 800 MG PO TABS: 800.0000 mg | ORAL_TABLET | Freq: Three times a day (TID) | ORAL | 0 refills | Status: DC | PRN

## 2020-07-25 MED ORDER — CLINDAMYCIN PHOSPHATE 900 MG/50ML IV SOLN
900.0000 mg | INTRAVENOUS | Status: AC
  Administered 2020-07-25: 900 mg via INTRAVENOUS
  Filled 2020-07-25: qty 50

## 2020-07-25 MED ORDER — SODIUM CHLORIDE 0.9 % IR SOLN
Status: DC | PRN
  Administered 2020-07-25: 1000 mL

## 2020-07-25 MED ORDER — PROPOFOL 1000 MG/100ML IV EMUL
INTRAVENOUS | Status: AC
  Filled 2020-07-25: qty 100

## 2020-07-25 MED ORDER — SUGAMMADEX SODIUM 200 MG/2ML IV SOLN
INTRAVENOUS | Status: DC | PRN
  Administered 2020-07-25: 250 mg via INTRAVENOUS

## 2020-07-25 MED ORDER — ONDANSETRON HCL 4 MG/2ML IJ SOLN
INTRAMUSCULAR | Status: DC | PRN
  Administered 2020-07-25: 4 mg via INTRAVENOUS

## 2020-07-25 MED ORDER — DEXAMETHASONE SODIUM PHOSPHATE 10 MG/ML IJ SOLN
INTRAMUSCULAR | Status: DC | PRN
  Administered 2020-07-25: 10 mg via INTRAVENOUS

## 2020-07-25 MED ORDER — CHLORHEXIDINE GLUCONATE CLOTH 2 % EX PADS: 6.0000 | MEDICATED_PAD | Freq: Once | CUTANEOUS | Status: DC

## 2020-07-25 MED ORDER — PROPOFOL 500 MG/50ML IV EMUL
INTRAVENOUS | Status: DC | PRN
  Administered 2020-07-25: 25 ug/kg/min via INTRAVENOUS

## 2020-07-25 MED ORDER — 0.9 % SODIUM CHLORIDE (POUR BTL) OPTIME
TOPICAL | Status: DC | PRN
  Administered 2020-07-25: 1000 mL

## 2020-07-25 MED ORDER — PROPOFOL 10 MG/ML IV BOLUS
INTRAVENOUS | Status: AC
  Filled 2020-07-25: qty 20

## 2020-07-25 MED ORDER — FENTANYL CITRATE (PF) 250 MCG/5ML IJ SOLN
INTRAMUSCULAR | Status: AC
Start: 2020-07-25 — End: ?
  Filled 2020-07-25: qty 5

## 2020-07-25 MED ORDER — MIDAZOLAM HCL 2 MG/2ML IJ SOLN
INTRAMUSCULAR | Status: AC
  Filled 2020-07-25: qty 2

## 2020-07-25 MED ORDER — LIDOCAINE 2% (20 MG/ML) 5 ML SYRINGE
INTRAMUSCULAR | Status: AC
  Filled 2020-07-25: qty 5

## 2020-07-25 MED ORDER — ACETAMINOPHEN 500 MG PO TABS
1000.0000 mg | ORAL_TABLET | ORAL | Status: AC
  Administered 2020-07-25: 1000 mg via ORAL
  Filled 2020-07-25: qty 2

## 2020-07-25 MED ORDER — DEXAMETHASONE SODIUM PHOSPHATE 10 MG/ML IJ SOLN
INTRAMUSCULAR | Status: AC
  Filled 2020-07-25: qty 1

## 2020-07-25 MED ORDER — OXYCODONE HCL 5 MG PO TABS: 5.0000 mg | ORAL_TABLET | Freq: Once | ORAL | Status: DC | PRN

## 2020-07-25 MED ORDER — FENTANYL CITRATE (PF) 100 MCG/2ML IJ SOLN
INTRAMUSCULAR | Status: AC
  Filled 2020-07-25: qty 2

## 2020-07-25 MED ORDER — ROCURONIUM BROMIDE 10 MG/ML (PF) SYRINGE
PREFILLED_SYRINGE | INTRAVENOUS | Status: DC | PRN
  Administered 2020-07-25: 50 mg via INTRAVENOUS
  Administered 2020-07-25: 20 mg via INTRAVENOUS

## 2020-07-25 MED ORDER — OXYCODONE HCL 5 MG/5ML PO SOLN: 5.0000 mg | Freq: Once | ORAL | Status: DC | PRN

## 2020-07-25 MED ORDER — PROPOFOL 10 MG/ML IV BOLUS
INTRAVENOUS | Status: DC | PRN
  Administered 2020-07-25: 150 mg via INTRAVENOUS

## 2020-07-25 SURGICAL SUPPLY — 44 items
APPLIER CLIP ROT 10 11.4 M/L (STAPLE) ×3
BLADE CLIPPER SURG (BLADE) IMPLANT
CANISTER SUCT 3000ML PPV (MISCELLANEOUS) ×3 IMPLANT
CHLORAPREP W/TINT 26 (MISCELLANEOUS) ×3 IMPLANT
CLIP APPLIE ROT 10 11.4 M/L (STAPLE) ×2 IMPLANT
CLIP VESOLOCK XL 6/CT (CLIP) IMPLANT
COVER MAYO STAND STRL (DRAPES) IMPLANT
COVER SURGICAL LIGHT HANDLE (MISCELLANEOUS) ×6 IMPLANT
COVER WAND RF STERILE (DRAPES) IMPLANT
DERMABOND ADVANCED (GAUZE/BANDAGES/DRESSINGS) ×1
DERMABOND ADVANCED .7 DNX12 (GAUZE/BANDAGES/DRESSINGS) ×2 IMPLANT
DRAPE C-ARM 42X120 X-RAY (DRAPES) IMPLANT
DRAPE WARM FLUID 44X44 (DRAPES) IMPLANT
ELECT REM PT RETURN 9FT ADLT (ELECTROSURGICAL) ×3
ELECTRODE REM PT RTRN 9FT ADLT (ELECTROSURGICAL) ×2 IMPLANT
FILTER SMOKE EVAC LAPAROSHD (FILTER) IMPLANT
GLOVE BIO SURGEON STRL SZ 6 (GLOVE) ×3 IMPLANT
GLOVE INDICATOR 6.5 STRL GRN (GLOVE) ×3 IMPLANT
GOWN STRL REUS W/ TWL LRG LVL3 (GOWN DISPOSABLE) ×4 IMPLANT
GOWN STRL REUS W/TWL 2XL LVL3 (GOWN DISPOSABLE) ×3 IMPLANT
GOWN STRL REUS W/TWL LRG LVL3 (GOWN DISPOSABLE) ×6
KIT BASIN OR (CUSTOM PROCEDURE TRAY) ×3 IMPLANT
KIT TURNOVER KIT B (KITS) ×3 IMPLANT
L-HOOK LAP DISP 36CM (ELECTROSURGICAL) ×3
LHOOK LAP DISP 36CM (ELECTROSURGICAL) ×2 IMPLANT
NS IRRIG 1000ML POUR BTL (IV SOLUTION) ×3 IMPLANT
PAD ARMBOARD 7.5X6 YLW CONV (MISCELLANEOUS) ×3 IMPLANT
PENCIL BUTTON HOLSTER BLD 10FT (ELECTRODE) ×3 IMPLANT
POUCH RETRIEVAL ECOSAC 10 (ENDOMECHANICALS) ×2 IMPLANT
POUCH RETRIEVAL ECOSAC 10MM (ENDOMECHANICALS) ×3
SCISSORS LAP 5X35 DISP (ENDOMECHANICALS) ×3 IMPLANT
SET CHOLANGIOGRAPH 5 50 .035 (SET/KITS/TRAYS/PACK) IMPLANT
SET IRRIG TUBING LAPAROSCOPIC (IRRIGATION / IRRIGATOR) ×3 IMPLANT
SET TUBE SMOKE EVAC HIGH FLOW (TUBING) ×3 IMPLANT
SLEEVE ENDOPATH XCEL 5M (ENDOMECHANICALS) ×3 IMPLANT
SPECIMEN JAR SMALL (MISCELLANEOUS) ×3 IMPLANT
SUT MNCRL AB 4-0 PS2 18 (SUTURE) ×3 IMPLANT
TOWEL GREEN STERILE (TOWEL DISPOSABLE) ×3 IMPLANT
TOWEL GREEN STERILE FF (TOWEL DISPOSABLE) ×3 IMPLANT
TRAY LAPAROSCOPIC MC (CUSTOM PROCEDURE TRAY) ×3 IMPLANT
TROCAR XCEL BLUNT TIP 100MML (ENDOMECHANICALS) ×3 IMPLANT
TROCAR XCEL NON-BLD 11X100MML (ENDOMECHANICALS) ×3 IMPLANT
TROCAR XCEL NON-BLD 5MMX100MML (ENDOMECHANICALS) ×3 IMPLANT
WATER STERILE IRR 1000ML POUR (IV SOLUTION) ×3 IMPLANT

## 2020-07-25 NOTE — Discharge Instructions (Addendum)
[facility] °Office Phone Number [phone] ° ° POST OP INSTRUCTIONS ° °Always review your discharge instruction sheet given to you by the facility where your surgery was performed. ° °IF YOU HAVE DISABILITY OR FAMILY LEAVE FORMS, YOU MUST BRING THEM TO THE OFFICE FOR PROCESSING.  DO NOT GIVE THEM TO YOUR DOCTOR. ° °1. A prescription for pain medication may be given to you upon discharge.  Take your pain medication as prescribed, if needed.  If narcotic pain medicine is not needed, then you may take acetaminophen (Tylenol) or ibuprofen (Advil) as needed. °2. Take your usually prescribed medications unless otherwise directed °3. If you need a refill on your pain medication, please contact your pharmacy.  They will contact our office to request authorization.  Prescriptions will not be filled after 5pm or on week-ends. °4. You should eat very light the first 24 hours after surgery, such as soup, crackers, pudding, etc.  Resume your normal diet the day after surgery °5. It is common to experience some constipation if taking pain medication after surgery.  Increasing fluid intake and taking a stool softener will usually help or prevent this problem from occurring.  A mild laxative (Milk of Magnesia or Miralax) should be taken according to package directions if there are no bowel movements after 48 hours. °6. You may shower in 48 hours.  The surgical glue will flake off in 2-3 weeks.   °7. ACTIVITIES:  No strenuous activity or heavy lifting for 2 week.   °a. You may drive when you no longer are taking prescription pain medication, you can comfortably wear a seatbelt, and you can safely maneuver your car and apply brakes. °b. RETURN TO WORK:  __________n/a_______________ °You should see your doctor in the office for a follow-up appointment approximately three-four weeks after your surgery.   ° °WHEN TO CALL YOUR DOCTOR: °1. Fever over 101.0 °2. Nausea and/or vomiting. °3. Extreme swelling or  bruising. °4. Continued bleeding from incision. °5. Increased pain, redness, or drainage from the incision. ° °The clinic staff is available to answer your questions during regular business hours.  Please don’t hesitate to call and ask to speak to one of the nurses for clinical concerns.  If you have a medical emergency, go to the nearest emergency room or call 911.  A surgeon from [facility] is always on call at the hospital. ° °For further questions, please visit [email or web address]  ° °

## 2020-07-25 NOTE — Anesthesia Procedure Notes (Signed)
Procedure Name: Intubation Date/Time: 07/25/2020 1:12 PM Performed by: Bryson Corona, CRNA Pre-anesthesia Checklist: Patient identified, Emergency Drugs available, Suction available and Patient being monitored Patient Re-evaluated:Patient Re-evaluated prior to induction Oxygen Delivery Method: Circle System Utilized Preoxygenation: Pre-oxygenation with 100% oxygen Induction Type: IV induction Ventilation: Oral airway inserted - appropriate to patient size Laryngoscope Size: Mac and 3 Grade View: Grade II Tube type: Oral Tube size: 7.0 mm Number of attempts: 1 Airway Equipment and Method: Stylet and Oral airway Placement Confirmation: ETT inserted through vocal cords under direct vision,  positive ETCO2 and breath sounds checked- equal and bilateral Secured at: 22 cm Tube secured with: Tape Dental Injury: Teeth and Oropharynx as per pre-operative assessment

## 2020-07-25 NOTE — Op Note (Signed)
Laparoscopic Cholecystectomy  Indications: This patient presents with chronic RUQ pain and episodes of severe pain with sludge/small stones in the gallbladder.    Pre-operative Diagnosis: chronic calculous cholecystitis  Post-operative Diagnosis: Same  Surgeon: [name]   Assistants: Eliot Ford, MD, PGY 5  Anesthesia: General endotracheal anesthesia and local  ASA Class: 2  Procedure Details  The patient was seen again in the Holding Room. The risks, benefits, complications, treatment options, and expected outcomes were discussed with the patient. The possibilities of  bleeding, recurrent infection, damage to nearby structures, the need for additional procedures, failure to diagnose a condition, the possible need to convert to an open procedure, and creating a complication requiring transfusion or operation were discussed with the patient. The likelihood of improving the patient's symptoms with return to their baseline status is good.    The patient and/or family concurred with the proposed plan, giving informed consent. The site of surgery properly noted. The patient was taken to Operating Room, and the procedure verified as Laparoscopic Cholecystectomy with Intraoperative Cholangiogram. A Time Out was held and the above information confirmed.  Prior to the induction of general anesthesia, antibiotic prophylaxis was administered. General endotracheal anesthesia was then administered and tolerated well. After the induction, the abdomen was prepped with Chloraprep and draped in the sterile fashion. The patient was positioned in the supine position.  Local anesthetic agent was injected into the skin near the umbilicus and a vertical incision was made with a #11 blade. I dissected down to the abdominal fascia with blunt dissection.  The fascia was incised vertically and we entered the peritoneal cavity bluntly.  A pursestring suture of 0-Vicryl was placed around the fascial opening.  The  Hasson cannula was inserted and secured with the stay suture.  Pneumoperitoneum was then created with CO2 and tolerated well without any adverse changes in the patient's vital signs. An 11-mm port was placed in the subxiphoid position.  Two 5-mm ports were placed in the right upper quadrant. All skin incisions were infiltrated with a local anesthetic agent before making the incision and placing the trocars.   We positioned the patient in reverse Trendelenburg, tilted slightly to the patient's left.  The gallbladder was identified, the fundus grasped and retracted cephalad. Adhesions were lysed bluntly and with the electrocautery where indicated, taking care not to injure any adjacent organs or viscus. The infundibulum was grasped and retracted laterally, exposing the peritoneum overlying the triangle of Calot. This was then divided and exposed in a blunt fashion.   The cystic duct and cystic artery was clearly identified and bluntly dissected circumferentially. A critical view of the cystic duct and cystic artery was obtained. The cystic duct was very small.  The cystic duct was then ligated with clips and divided. The cystic artery was identified, dissected free, ligated with clips and divided as well.   The gallbladder was dissected from the liver bed in retrograde fashion with the electrocautery. The gallbladder was removed and placed in an Ecosac.  The gallbladder and bag were then removed through the umbilical port site.  The liver bed was irrigated and inspected. Hemostasis was achieved with the electrocautery. Copious irrigation was utilized and was repeatedly aspirated until clear.    We again inspected the right upper quadrant for hemostasis. A 4 quadrant inspection was performed, and no pooling of blood or succus was seen.   Pneumoperitoneum was released as we removed the trocars.   The pursestring suture was used to close the umbilical fascia.  There was no residual palpable fascial defect.  4-0  Monocryl was used to close the skin.   The skin was cleaned and dry, and Dermabond was applied. The patient was then extubated and brought to the recovery room in stable condition. Instrument, sponge, and needle counts were correct at closure and at the conclusion of the case.   Findings: Mild chronic inflammation, intrahepatic gallbladder.  Soft liver tissue.    Estimated Blood Loss: minimal         Drains: none          Specimens: Gallbladder to pathology       Complications: None; patient tolerated the procedure well.         Disposition: PACU - hemodynamically stable.         Condition: stable

## 2020-07-25 NOTE — Progress Notes (Signed)
Pt is awake,alert and oriented. Pt is in NAD at this time. Pt and family verbalized understanding of poc and discharge instructions. instructions given to family and reviewed prior to discharge.Pt is ambulatory to wheelchair with stand by assist but not needed with steady gait, also ambulated to restroom.  Pt taken to front lobby and placed in car with family.  Pt has glasses in place

## 2020-07-25 NOTE — Transfer of Care (Signed)
Immediate Anesthesia Transfer of Care Note  Patient: Rita Levine  Procedure(s) Performed: LAPAROSCOPIC CHOLECYSTECTOMY (N/A Abdomen)  Patient Location: PACU  Anesthesia Type:General  Level of Consciousness: awake and alert   Airway & Oxygen Therapy: Patient Spontanous Breathing and Patient connected to face mask oxygen  Post-op Assessment: Report given to RN and Post -op Vital signs reviewed and stable  Post vital signs: Reviewed and stable  Last Vitals:  Vitals Value Taken Time  BP 170/82 07/25/20 1416  Temp    Pulse 68 07/25/20 1418  Resp 17 07/25/20 1418  SpO2 100 % 07/25/20 1418  Vitals shown include unvalidated device data.  Last Pain:  Vitals:   07/25/20 1125  TempSrc:   PainSc: 0-No pain         Complications: No complications documented.

## 2020-07-25 NOTE — Anesthesia Postprocedure Evaluation (Signed)
Anesthesia Post Note  Patient: Rita Levine  Procedure(s) Performed: LAPAROSCOPIC CHOLECYSTECTOMY (N/A Abdomen)     Patient location during evaluation: PACU Anesthesia Type: General Level of consciousness: awake and alert Pain management: pain level controlled Vital Signs Assessment: post-procedure vital signs reviewed and stable Respiratory status: spontaneous breathing, nonlabored ventilation and respiratory function stable Cardiovascular status: blood pressure returned to baseline and stable Postop Assessment: no apparent nausea or vomiting Anesthetic complications: no   No complications documented.  Last Vitals:  Vitals:   07/25/20 1430 07/25/20 1445  BP: (!) 154/84 (!) 152/92  Pulse: 66 65  Resp: 17 16  Temp:    SpO2: 100% 100%    Last Pain:  Vitals:   07/25/20 1459  TempSrc:   PainSc: [place]

## 2020-07-25 NOTE — Interval H&P Note (Signed)
History and Physical Interval Note:  07/25/2020 12:40 PM  Rita Levine  has presented today for surgery, with the diagnosis of gallbladder sludge, chronic cholecystis.  The various methods of treatment have been discussed with the patient and family. After consideration of risks, benefits and other options for treatment, the patient has consented to  Procedure(s): LAPAROSCOPIC CHOLECYSTECTOMY WITH POSSIBLE INTRAOPERATIVE CHOLANGIOGRAM (N/A) as a surgical intervention.  The patient's history has been reviewed, patient examined, no change in status, stable for surgery.  I have reviewed the patient's chart and labs.  Questions were answered to the patient's satisfaction.     [name]

## 2020-07-26 ENCOUNTER — Encounter (HOSPITAL_COMMUNITY): Payer: Self-pay | Admitting: General Surgery

## 2020-07-26 LAB — SURGICAL PATHOLOGY

## 2020-08-05 ENCOUNTER — Other Ambulatory Visit: Payer: Self-pay

## 2020-08-05 MED ORDER — DICLOFENAC SODIUM 75 MG PO TBEC
75.0000 mg | DELAYED_RELEASE_TABLET | Freq: Two times a day (BID) | ORAL | 5 refills | Status: DC
Start: 2020-08-05 — End: 2020-11-28

## 2020-08-08 ENCOUNTER — Other Ambulatory Visit: Payer: Self-pay

## 2020-08-08 ENCOUNTER — Ambulatory Visit (INDEPENDENT_AMBULATORY_CARE_PROVIDER_SITE_OTHER): Payer: Medicare HMO | Admitting: Podiatry

## 2020-08-08 DIAGNOSIS — L600 Ingrowing nail: Secondary | ICD-10-CM

## 2020-08-08 DIAGNOSIS — M79674 Pain in right toe(s): Secondary | ICD-10-CM | POA: Diagnosis not present

## 2020-08-08 MED ORDER — MUPIROCIN 2 % EX OINT: 1.0000 "application " | TOPICAL_OINTMENT | Freq: Two times a day (BID) | CUTANEOUS | 2 refills | Status: DC

## 2020-08-08 NOTE — Patient Instructions (Signed)
Place 1/4 cup of epsom salts in a quart of warm tap water.  Submerge your foot or feet in the solution and soak for 20 minutes.  This soak should be done twice a day.  Next, remove your foot or feet from solution, blot dry the affected area. Apply ointment and cover if instructed by your doctor.   IF YOUR SKIN BECOMES IRRITATED WHILE USING THESE INSTRUCTIONS, IT IS OKAY TO SWITCH TO  WHITE VINEGAR AND WATER.  As another alternative soak, you may use antibacterial soap and water.  Monitor for any signs/symptoms of infection. Call the office immediately if any occur or go directly to the emergency room. Call with any questions/concerns.  Ingrown Toenail An ingrown toenail occurs when the corner or sides of a toenail grow into the surrounding skin. This causes discomfort and pain. The big toe is most commonly affected, but any of the toes can be affected. If an ingrown toenail is not treated, it can become infected. What are the causes? This condition may be caused by:  Wearing shoes that are too small or tight.  An injury, such as stubbing your toe or having your toe stepped on.  Improper cutting or care of your toenails.  Having nail or foot abnormalities that were present from birth (congenital abnormalities), such as having a nail that is too big for your toe. What increases the risk? The following factors may make you more likely to develop ingrown toenails:  Age. Nails tend to get thicker with age, so ingrown nails are more common among older people.  Cutting your toenails incorrectly, such as cutting them very short or cutting them unevenly. An ingrown toenail is more likely to get infected if you have:  Diabetes.  Blood flow (circulation) problems. What are the signs or symptoms? Symptoms of an ingrown toenail may include:  Pain, soreness, or tenderness.  Redness.  Swelling.  Hardening of the skin that surrounds the toenail. Signs that an ingrown toenail may be infected  include:  Fluid or pus.  Symptoms that get worse instead of better. How is this diagnosed? An ingrown toenail may be diagnosed based on your medical history, your symptoms, and a physical exam. If you have fluid or blood coming from your toenail, a sample may be collected to test for the specific type of bacteria that is causing the infection. How is this treated? Treatment depends on how severe your ingrown toenail is. You may be able to care for your toenail at home.  If you have an infection, you may be prescribed antibiotic medicines.  If you have fluid or pus draining from your toenail, your health care provider may drain it.  If you have trouble walking, you may be given crutches to use.  If you have a severe or infected ingrown toenail, you may need a procedure to remove part or all of the nail. Follow these instructions at home: Foot care   Do not pick at your toenail or try to remove it yourself.  Soak your foot in warm, soapy water. Do this for 20 minutes, 3 times a day, or as often as told by your health care provider. This helps to keep your toe clean and keep your skin soft.  Wear shoes that fit well and are not too tight. Your health care provider may recommend that you wear open-toed shoes while you heal.  Trim your toenails regularly and carefully. Cut your toenails straight across to prevent injury to the skin at the   corners of the toenail. Do not cut your nails in a curved shape.  Keep your feet clean and dry to help prevent infection. Medicines  Take over-the-counter and prescription medicines only as told by your health care provider.  If you were prescribed an antibiotic, take it as told by your health care provider. Do not stop taking the antibiotic even if you start to feel better. Activity  Return to your normal activities as told by your health care provider. Ask your health care provider what activities are safe for you.  Avoid activities that cause  pain. General instructions  If your health care provider told you to use crutches to help you move around, use them as instructed.  Keep all follow-up visits as told by your health care provider. This is important. Contact a health care provider if:  You have more redness, swelling, pain, or other symptoms that do not improve with treatment.  You have fluid, blood, or pus coming from your toenail. Get help right away if:  You have a red streak on your skin that starts at your foot and spreads up your leg.  You have a fever. Summary  An ingrown toenail occurs when the corner or sides of a toenail grow into the surrounding skin. This causes discomfort and pain. The big toe is most commonly affected, but any of the toes can be affected.  If an ingrown toenail is not treated, it can become infected.  Fluid or pus draining from your toenail is a sign of infection. Your health care provider may need to drain it. You may be given antibiotics to treat the infection.  Trimming your toenails regularly and properly can help you prevent an ingrown toenail. This information is not intended to replace advice given to you by your health care provider. Make sure you discuss any questions you have with your health care provider. Document Revised: 01/22/2019 Document Reviewed: 06/18/2017 Elsevier Patient Education  2020 [employer] Inc[employer]  

## 2020-08-12 NOTE — Progress Notes (Signed)
Subjective:   Patient ID: Rita Levine, female   DOB: 69 y.o.   MRN: [id number]   HPI 69 year old female presents the office today for concerns of ingrown toenail right big toe, lateral aspect.  She states this been ongoing since the summer and did get somewhat better but is still inflamed and red in the corners causing discomfort.  She previously was on antibiotics as well as soaking as well as antibiotic ointment.  She has no other concerns today.   Review of Systems  All other systems reviewed and are negative.  Past Medical History:  Diagnosis Date  . Allergic rhinitis   . Allergic rhinitis due to pollen 12/03/2019  . Anemia   . Asthma   . Chest pain, unspecified 04/07/2014  . Chicken pox   . Colon polyp   . Diverticulosis   . Dysphagia   . Dysrhythmia    Palpitations  . Fibromyalgia   . Food allergy   . Gallstone   . GERD (gastroesophageal reflux disease)   . Hiatal hernia   . Hypercholesteremia   . Hypertension   . Hypothyroidism   . IBS (irritable bowel syndrome)   . Migraine headache    occasionally  . Osteoarthritis   . Osteopenia   . Pneumonia   . PONV (postoperative nausea and vomiting)   . Pulmonary hypertension ([place])    pt denies, pt states she was tested for it but was not diagnosed with it  . Rectal bleeding   . SBO (small bowel obstruction) ([place]) 10/20/2017  . Seasonal allergies   . Thyroid nodule     Past Surgical History:  Procedure Laterality Date  . APPENDECTOMY  1982  . BREAST EXCISIONAL BIOPSY Right 1995   benign  . BREAST LUMPECTOMY  1991  . CARPAL TUNNEL RELEASE Right 2006   Right wrist  . CESAREAN SECTION     x 2  . CHOLECYSTECTOMY N/A 07/25/2020   Procedure: LAPAROSCOPIC CHOLECYSTECTOMY;  Surgeon: [name], MD;  Location: [facility];  Service: General;  Laterality: N/A;  . COLON RESECTION  1990  . COLONOSCOPY    . CT ABDOMEN PELVIS W ([place] HX)  01/03/2020  . IMAGE MRI brain:  04/2012   No focal IAC or inner ear lesion to explain  hearing loss. Slightly greater than expected number of subcortical T2- hyperintensities bilaterally.  These are nonspecific.  They can be seen in the setting of chronic migraine headaches, demyelinating process, chronic microvascular ischemic, prior infection or inflammation, or vasculitis  . IMAGE MRI lumbar:  05/01/2006   Mild central canal stenosis with facet arthropathy and mildly bulging disc at L4-5.  There is mild narrowing in the left lateral recess at this level.  No definite neural impingement.  Appearance at this level has not markedly Moderately severe facet arthropathy at L5-S1 with mild interval progression of a diffuse broad based disc bulge.  There is mild biforaminal narrowing.  Central canal is open  . LAPAROSCOPIC ABDOMINAL EXPLORATION  2019   adhesion removal> caused bowel blockage   . NASAL SEPTUM SURGERY  2004  . TONSILLECTOMY  1965  . TOTAL ABDOMINAL HYSTERECTOMY W/ BILATERAL SALPINGOOPHORECTOMY  1988  . TUBAL LIGATION  1974  . UPPER GASTROINTESTINAL ENDOSCOPY  2020     Current Outpatient Medications:  .  albuterol (PROAIR HFA) 108 (90 Base) MCG/ACT inhaler, Inhale 2 puffs into the lungs every 6 (six) hours as needed. (Patient taking differently: Inhale 2 puffs into the lungs every 6 (six) hours as  needed for wheezing or shortness of breath. ), Disp: 6.7 g, Rfl: 5 .  ALLERGY RELIEF 180 MG tablet, TAKE ONE TABLET BY MOUTH EVERY DAY (Patient taking differently: Take 180 mg by mouth daily. ), Disp: 90 tablet, Rfl: 0 .  ARMOUR THYROID 15 MG tablet, Take 1 tablet (15 mg total) by mouth daily. (Patient taking differently: Take 15 mg by mouth daily. Take with 30 mg to equal 45 mg once daily), Disp: 90 tablet, Rfl: 2 .  ARMOUR THYROID 30 MG tablet, Take 1 tablet (30 mg total) by mouth daily before breakfast. (Patient taking differently: Take 30 mg by mouth daily before breakfast. Take with 15 mg to equal 45 mg once daily), Disp: 90 tablet, Rfl: 2 .  Ascorbic Acid (VITAMIN C) 1000 MG  tablet, Take 3,000 mg by mouth daily. , Disp: , Rfl:  .  atenolol (TENORMIN) 25 MG tablet, Take 1 tablet (25 mg total) by mouth daily., Disp: 90 tablet, Rfl: 1 .  calcium carbonate (TUMS - DOSED IN MG ELEMENTAL CALCIUM) 500 MG chewable tablet, Chew 3 tablets by mouth daily as needed for indigestion or heartburn., Disp: , Rfl:  .  Carboxymethylcellulose Sodium (THERATEARS OP), Place 1 drop into both eyes 2 (two) times daily., Disp: , Rfl:  .  Cholecalciferol (VITAMIN D) 125 MCG (5000 UT) CAPS, Take 5,000 Units by mouth daily. , Disp: , Rfl:  .  Coenzyme Q10 (COQ-10) 100 MG CAPS, Take 100 mg by mouth daily., Disp: , Rfl:  .  diclofenac (VOLTAREN) 75 MG EC tablet, Take 1 tablet (75 mg total) by mouth 2 (two) times daily., Disp: 60 tablet, Rfl: 5 .  doxycycline (VIBRA-TABS) 100 MG tablet, Take 1 tablet (100 mg total) by mouth 2 (two) times daily., Disp: 10 tablet, Rfl: 0 .  ezetimibe (ZETIA) 10 MG tablet, Take 1 tablet (10 mg total) by mouth daily. (Patient taking differently: Take 10 mg by mouth every evening. ), Disp: 90 tablet, Rfl: 3 .  famotidine (PEPCID) 20 MG tablet, TAKE 1 TABLET BY MOUTH EVERY DAY (Patient taking differently: Take 20 mg by mouth every evening. ), Disp: 90 tablet, Rfl: 1 .  fluticasone (FLONASE) 50 MCG/ACT nasal spray, SPRAY 2 SPRAYS INTO EACH NOSTRIL EVERY DAY (Patient taking differently: Place 1 spray into both nostrils 2 (two) times daily. ), Disp: 48 mL, Rfl: 1 .  hyoscyamine (LEVSIN) 0.125 MG tablet, Take 1 tablet (0.125 mg total) by mouth every 6 (six) hours as needed. (Patient taking differently: Take 0.125 mg by mouth every 6 (six) hours as needed for bladder spasms or cramping. ), Disp: 120 tablet, Rfl: 1 .  ibuprofen (ADVIL) 800 MG tablet, Take 1 tablet (800 mg total) by mouth every 8 (eight) hours as needed., Disp: 30 tablet, Rfl: 0 .  levocetirizine (XYZAL) 5 MG tablet, Take 5 mg by mouth every evening., Disp: , Rfl:  .  losartan (COZAAR) 25 MG tablet, TAKE 0.5-1  TABLETS (12.5-25 MG TOTAL) BY MOUTH DAILY., Disp: 90 tablet, Rfl: 0 .  mometasone (ELOCON) 0.1 % lotion, Apply 1 application topically daily as needed (ear irritation). , Disp: , Rfl:  .  montelukast (SINGULAIR) 10 MG tablet, TAKE 1 TABLET BY MOUTH EVERYDAY AT BEDTIME (Patient taking differently: Take 10 mg by mouth at bedtime. ), Disp: 90 tablet, Rfl: 0 .  mupirocin ointment (BACTROBAN) 2 %, Apply 1 application topically 2 (two) times daily., Disp: 30 g, Rfl: 2 .  ondansetron (ZOFRAN-ODT) 8 MG disintegrating tablet, Take 1 tablet (8  mg total) by mouth every 8 (eight) hours as needed for nausea or vomiting., Disp: 30 tablet, Rfl: 1 .  pantoprazole (PROTONIX) 40 MG tablet, TAKE 1 TABLET BY MOUTH EVERY DAY (Patient taking differently: Take 40 mg by mouth daily. ), Disp: 90 tablet, Rfl: 1 .  Probiotic CAPS, Take 1 capsule by mouth daily., Disp: , Rfl:  .  rosuvastatin (CRESTOR) 10 MG tablet, TAKE 1 TABLET ONCE A WEEK FOR 1 WEEK, THEN INCREASE TO TAKING 1 TABLET TWICE A WEEK (Patient taking differently: Take 10 mg by mouth 2 (two) times a week. Thursdays and Sundays at night), Disp: 26 tablet, Rfl: 2 .  SYMBICORT 80-4.5 MCG/ACT inhaler, INHALE 2 PUFFS INTO THE LUNGS IN THE MORNING AND AT BEDTIME. (Patient taking differently: Inhale 2 puffs into the lungs 2 (two) times daily. ), Disp: 30.6 Inhaler, Rfl: 1 .  tiZANidine (ZANAFLEX) 4 MG tablet, Take 1 tablet (4 mg total) by mouth every 6 (six) hours as needed for muscle spasms. (Patient taking differently: Take 4 mg by mouth every evening. ), Disp: 120 tablet, Rfl: 5 .  VITAMIN A PO, Take 1 tablet by mouth daily., Disp: , Rfl:  .  vitamin E 1000 UNIT capsule, Take 1,000 Units by mouth daily., Disp: , Rfl:  .  zinc gluconate 50 MG tablet, Take 100 mg by mouth daily., Disp: , Rfl:  .  zolpidem (AMBIEN) 5 MG tablet, Take 1 tablet (5 mg total) by mouth at bedtime. (Patient taking differently: Take 5 mg by mouth at bedtime as needed for sleep. ), Disp: 90 tablet,  Rfl: 1  Allergies  Allergen Reactions  . Sulfamethoxazole-Trimethoprim Hives  . Avelox [Moxifloxacin Hcl In Nacl]     Racing heart  . Cephalexin Hives  . Codeine Hives    Heart racing  . Erythromycin Hives  . Flagyl [Metronidazole]     Unknown reaction  . Propranolol Hives  . Sulfa Antibiotics Hives  . Tetanus Toxoid Swelling    Reports a fever and headache with swelling.   . Tramadol Hives  . Statins Other (See Comments)    Myalgia         Objective:  Physical Exam  General: AAO x3, NAD  Dermatological: Incurvation is present in the lateral aspect of right hallux toenail with localized edema and erythema.  There is no purulence or ascending cellulitis identified today.  There is no fluctuation crepitation there is no malodor.  There are no open sores, no preulcerative lesions, no rash or signs of infection present.  Vascular: Dorsalis Pedis artery and Posterior Tibial artery pedal pulses are 2/4 bilateral with immedate capillary fill time.  There is no pain with calf compression, swelling, warmth, erythema.   Neruologic: Grossly intact via light touch bilateral.   Musculoskeletal: No gross boney pedal deformities bilateral. No pain, crepitus, or limitation noted with foot and ankle range of motion bilateral. Muscular strength 5/5 in all groups tested bilateral.  Gait: Unassisted, Nonantalgic.       Assessment:   Right lateral hallux symptomatic ingrown toenail     Plan:  -Treatment options discussed including all alternatives, risks, and complications -Etiology of symptoms were discussed -At this time, the patient is requesting partial nail removal with chemical matricectomy to the symptomatic portion of the nail. Risks and complications were discussed with the patient for which they understand and written consent was obtained. Under sterile conditions a total of 3 mL of a mixture of 2% lidocaine plain and 0.5% Marcaine plain was infiltrated  in a hallux block  fashion. Once anesthetized, the skin was prepped in sterile fashion. A tourniquet was then applied. Next the lateral aspect of hallux nail border was then sharply excised making sure to remove the entire offending nail border. Once the nails were ensured to be removed area was debrided and the underlying skin was intact. There is no purulence identified in the procedure. Next phenol was then applied under standard conditions and copiously irrigated. Silvadene was applied. A dry sterile dressing was applied. After application of the dressing the tourniquet was removed and there is found to be an immediate capillary refill time to the digit. The patient tolerated the procedure well any complications. Post procedure instructions were discussed the patient for which he verbally understood. Follow-up in one week for nail check or sooner if any problems are to arise. Discussed signs/symptoms of infection and directed to call the office immediately should any occur or go directly to the emergency room. In the meantime, encouraged to call the office with any questions, concerns, changes symptoms.  Trula Slade DPM

## 2020-08-21 ENCOUNTER — Ambulatory Visit: Payer: Medicare HMO | Admitting: Podiatry

## 2020-08-21 ENCOUNTER — Other Ambulatory Visit: Payer: Self-pay

## 2020-08-21 DIAGNOSIS — L600 Ingrowing nail: Secondary | ICD-10-CM | POA: Diagnosis not present

## 2020-08-21 NOTE — Patient Instructions (Signed)

## 2020-08-23 DIAGNOSIS — L600 Ingrowing nail: Secondary | ICD-10-CM

## 2020-08-23 HISTORY — DX: Ingrowing nail: L60.0

## 2020-08-23 NOTE — Progress Notes (Signed)
Subjective: Rita Levine is a 69 y.o.  female returns to office today for follow up evaluation after having right Hallux partial nail avulsion performed. Patient has been soaking using epsom salts and applying topical antibiotic covered with bandaid daily.  She states that is tender but overall swelling better than it did previously.  Patient denies fevers, chills, nausea, vomiting. Denies any calf pain, chest pain, SOB.   Objective:  General: Well developed, nourished, in no acute distress, alert and oriented x3   Dermatology: Skin is warm, dry and supple bilateral. Right hallux nail border appears to be clean, dry, with mild granular tissue and surrounding scab. There is no surrounding erythema, edema, drainage/purulence. The remaining nails appear unremarkable at this time. There are no other lesions or other signs of infection present.  Neurovascular status: Intact. No lower extremity swelling; No pain with calf compression bilateral.  Musculoskeletal: Decreased tenderness to palpation of the right hallux nail fold. Muscular strength within normal limits bilateral.   Assesement and Plan: S/p partial nail avulsion, doing well.   -Continue soaking in epsom salts twice a day followed by antibiotic ointment and a band-aid. Can leave uncovered at night. Continue this until completely healed.  -If the area has not healed in 2 weeks, call the office for follow-up appointment, or sooner if any problems arise.  -Monitor for any signs/symptoms of infection. Call the office immediately if any occur or go directly to the emergency room. Call with any questions/concerns.  Celesta Gentile, DPM

## 2020-08-27 NOTE — Progress Notes (Signed)
Cardiology Office Note   Date:  08/28/2020   ID:  JUDIETH MCKOWN, DOB 1951/09/14, MRN [id number]  PCP:  Ma Hillock, DO    No chief complaint on file.  palpitations  Wt Readings from Last 3 Encounters:  08/28/20 137 lb 6.4 oz (62.3 kg)  07/25/20 135 lb 1.6 oz (61.3 kg)  07/21/20 135 lb 1.6 oz (61.3 kg)       History of Present Illness: Rita Levine is a 69 y.o. female  With aortic atherosclerosis.  She is friends with Chrissie Noa and Ardis Rowan, who are my patients as well.   3/21 CT: Vascular/Lymphatic: Advanced atherosclerotic calcifications involving the aorta and iliac arteries for the patient's age.   She has no family h/o CAD.  Mother was 58 when she passed away.  Siblings without CAD.    She had a negative ETT in 2015.  She had intolerance to Zocor in the past.   Palpitations have occurred in the past few months.  BP cuff will register irregular HR, and she feels lightheaded.  No syncope.    Denies : Chest pain. Dizziness. Leg edema. Nitroglycerin use. Orthopnea.  Paroxysmal nocturnal dyspnea. Shortness of breath. Syncope.    Past Medical History:  Diagnosis Date  . Allergic rhinitis   . Allergic rhinitis due to pollen 12/03/2019  . Anemia   . Asthma   . Chest pain, unspecified 04/07/2014  . Chicken pox   . Colon polyp   . Diverticulosis   . Dysphagia   . Dysrhythmia    Palpitations  . Fibromyalgia   . Food allergy   . Gallstone   . GERD (gastroesophageal reflux disease)   . Hiatal hernia   . Hypercholesteremia   . Hypertension   . Hypothyroidism   . IBS (irritable bowel syndrome)   . Migraine headache    occasionally  . Osteoarthritis   . Osteopenia   . Pneumonia   . PONV (postoperative nausea and vomiting)   . Pulmonary hypertension ([place])    pt denies, pt states she was tested for it but was not diagnosed with it  . Rectal bleeding   . SBO (small bowel obstruction) ([place]) 10/20/2017  . Seasonal allergies   . Thyroid nodule      Past Surgical History:  Procedure Laterality Date  . APPENDECTOMY  1982  . BREAST EXCISIONAL BIOPSY Right 1995   benign  . BREAST LUMPECTOMY  1991  . CARPAL TUNNEL RELEASE Right 2006   Right wrist  . CESAREAN SECTION     x 2  . CHOLECYSTECTOMY N/A 07/25/2020   Procedure: LAPAROSCOPIC CHOLECYSTECTOMY;  Surgeon: [name], MD;  Location: [facility];  Service: General;  Laterality: N/A;  . COLON RESECTION  1990  . COLONOSCOPY    . CT ABDOMEN PELVIS W ([place] HX)  01/03/2020  . IMAGE MRI brain:  04/2012   No focal IAC or inner ear lesion to explain hearing loss. Slightly greater than expected number of subcortical T2- hyperintensities bilaterally.  These are nonspecific.  They can be seen in the setting of chronic migraine headaches, demyelinating process, chronic microvascular ischemic, prior infection or inflammation, or vasculitis  . IMAGE MRI lumbar:  05/01/2006   Mild central canal stenosis with facet arthropathy and mildly bulging disc at L4-5.  There is mild narrowing in the left lateral recess at this level.  No definite neural impingement.  Appearance at this level has not markedly Moderately severe facet arthropathy at L5-S1 with mild interval  progression of a diffuse broad based disc bulge.  There is mild biforaminal narrowing.  Central canal is open  . LAPAROSCOPIC ABDOMINAL EXPLORATION  2019   adhesion removal> caused bowel blockage   . NASAL SEPTUM SURGERY  2004  . TONSILLECTOMY  1965  . TOTAL ABDOMINAL HYSTERECTOMY W/ BILATERAL SALPINGOOPHORECTOMY  1988  . TUBAL LIGATION  1974  . UPPER GASTROINTESTINAL ENDOSCOPY  2020     Current Outpatient Medications  Medication Sig Dispense Refill  . albuterol (PROAIR HFA) 108 (90 Base) MCG/ACT inhaler Inhale 2 puffs into the lungs every 6 (six) hours as needed. 6.7 g 5  . ALLERGY RELIEF 180 MG tablet TAKE ONE TABLET BY MOUTH EVERY DAY 90 tablet 0  . ARMOUR THYROID 15 MG tablet Take 1 tablet (15 mg total) by mouth daily. 90 tablet 2   . ARMOUR THYROID 30 MG tablet Take 1 tablet (30 mg total) by mouth daily before breakfast. 90 tablet 2  . Ascorbic Acid (VITAMIN C) 1000 MG tablet Take 3,000 mg by mouth daily.     Marland Kitchen atenolol (TENORMIN) 25 MG tablet Take 1 tablet (25 mg total) by mouth daily. 90 tablet 1  . calcium carbonate (TUMS - DOSED IN MG ELEMENTAL CALCIUM) 500 MG chewable tablet Chew 3 tablets by mouth daily as needed for indigestion or heartburn.    . Carboxymethylcellulose Sodium (THERATEARS OP) Place 1 drop into both eyes 2 (two) times daily.    . Cholecalciferol (VITAMIN D) 125 MCG (5000 UT) CAPS Take 5,000 Units by mouth daily.     . Coenzyme Q10 (COQ-10) 100 MG CAPS Take 100 mg by mouth daily.    . diclofenac (VOLTAREN) 75 MG EC tablet Take 1 tablet (75 mg total) by mouth 2 (two) times daily. 60 tablet 5  . ezetimibe (ZETIA) 10 MG tablet Take 1 tablet (10 mg total) by mouth daily. 90 tablet 3  . famotidine (PEPCID) 20 MG tablet TAKE 1 TABLET BY MOUTH EVERY DAY 90 tablet 1  . fluticasone (FLONASE) 50 MCG/ACT nasal spray SPRAY 2 SPRAYS INTO EACH NOSTRIL EVERY DAY 48 mL 1  . hyoscyamine (LEVSIN) 0.125 MG tablet Take 1 tablet (0.125 mg total) by mouth every 6 (six) hours as needed. 120 tablet 1  . ibuprofen (ADVIL) 800 MG tablet Take 1 tablet (800 mg total) by mouth every 8 (eight) hours as needed. 30 tablet 0  . levocetirizine (XYZAL) 5 MG tablet Take 5 mg by mouth every evening.    Marland Kitchen losartan (COZAAR) 25 MG tablet TAKE 0.5-1 TABLETS (12.5-25 MG TOTAL) BY MOUTH DAILY. 90 tablet 0  . mometasone (ELOCON) 0.1 % lotion Apply 1 application topically daily as needed (ear irritation).     . montelukast (SINGULAIR) 10 MG tablet TAKE 1 TABLET BY MOUTH EVERYDAY AT BEDTIME 90 tablet 0  . mupirocin ointment (BACTROBAN) 2 % Apply 1 application topically 2 (two) times daily. 30 g 2  . ondansetron (ZOFRAN-ODT) 8 MG disintegrating tablet Take 1 tablet (8 mg total) by mouth every 8 (eight) hours as needed for nausea or vomiting. 30  tablet 1  . pantoprazole (PROTONIX) 40 MG tablet TAKE 1 TABLET BY MOUTH EVERY DAY 90 tablet 1  . Probiotic CAPS Take 1 capsule by mouth daily.    . rosuvastatin (CRESTOR) 10 MG tablet TAKE 1 TABLET ONCE A WEEK FOR 1 WEEK, THEN INCREASE TO TAKING 1 TABLET TWICE A WEEK 26 tablet 2  . SYMBICORT 80-4.5 MCG/ACT inhaler INHALE 2 PUFFS INTO THE LUNGS IN THE  MORNING AND AT BEDTIME. 30.6 Inhaler 1  . tiZANidine (ZANAFLEX) 4 MG tablet Take 1 tablet (4 mg total) by mouth every 6 (six) hours as needed for muscle spasms. 120 tablet 5  . VITAMIN A PO Take 1 tablet by mouth daily.    . vitamin E 1000 UNIT capsule Take 1,000 Units by mouth daily.    Marland Kitchen zinc gluconate 50 MG tablet Take 100 mg by mouth daily.    Marland Kitchen zolpidem (AMBIEN) 5 MG tablet Take 1 tablet (5 mg total) by mouth at bedtime. 90 tablet 1   No current facility-administered medications for this visit.    Allergies:   Sulfamethoxazole-trimethoprim, Avelox [moxifloxacin hcl in nacl], Cephalexin, Codeine, Erythromycin, Flagyl [metronidazole], Propranolol, Sulfa antibiotics, Tetanus toxoid, Tramadol, and Statins    Social History:  The patient  reports that she has never smoked. She has never used smokeless tobacco. She reports current alcohol use. She reports that she does not use drugs.   Family History:  The patient's family history includes Alcohol abuse in her brother and sister; Arthritis in her brother, brother, father, and mother; Asthma in her father; Breast cancer in her cousin and maternal aunt; COPD in her brother, father, sister, sister, and sister; Cancer in her sister; Clotting disorder in her sister; Depression in her brother; Diabetes in her brother and father; Diabetes type II in her sister; Early death in her father, sister, and sister; Emphysema in her maternal aunt and maternal uncle; Hearing loss in her mother; Heart disease in her mother and sister; Hypercholesterolemia in her brother; Hyperlipidemia in her father and mother;  Hypertension in her father, mother, and sister; Kidney disease in her mother; Lung cancer in her sister and another family member; Macular degeneration in her mother; Throat cancer in her brother.    ROS:  Please see the history of present illness.   Otherwise, review of systems are positive for palpitations.   All other systems are reviewed and negative.    PHYSICAL EXAM: VS:  BP 132/84   Pulse 76   Ht 5' (1.524 m)   Wt 137 lb 6.4 oz (62.3 kg)   SpO2 99%   BMI 26.83 kg/m  , BMI Body mass index is 26.83 kg/m. GEN: Well nourished, well developed, in no acute distress  HEENT: normal  Neck: no JVD, carotid bruits, or masses Cardiac: RRR; no murmurs, rubs, or gallops,no edema  Respiratory:  clear to auscultation bilaterally, normal work of breathing GI: soft, nontender, nondistended, + BS MS: no deformity or atrophy  Skin: warm and dry, no rash Neuro:  Strength and sensation are intact Psych: euthymic mood, full affect   EKG:   The ekg ordered in 4/21 demonstrates NSR, no ST changes    Recent Labs: 07/18/2020: Hemoglobin 12.3; Platelets 287.0; TSH 1.14 07/21/2020: ALT 18; BUN 9; Creatinine, Ser 0.67; Potassium 3.9; Sodium 140   Lipid Panel    Component Value Date/Time   CHOL 174 07/05/2020 0938   TRIG 162 (H) 07/05/2020 0938   HDL 41 07/05/2020 0938   CHOLHDL 4.2 07/05/2020 0938   LDLCALC 105 (H) 07/05/2020 2229     Other studies Reviewed: Additional studies/ records that were reviewed today with results demonstrating: prior labs reviewed.   ASSESSMENT AND PLAN:  1. Aortic atherosclerosis: Whole food plant based diet recommended.  COntinue rosuvastatin 2x/week and Zetia- tolerating this combo.. 2. Palpitations: Plan for Zio monitor for 14 days.  Doubt a serious arrhythmia since her exam does not show any significant issues.  3. Hyperlipidemia: LDL 105 in 9/21.  Healthy diet discussed.      Current medicines are reviewed at length with the patient today.  The  patient concerns regarding her medicines were addressed.  The following changes have been made:  No change  Labs/ tests ordered today include:  No orders of the defined types were placed in this encounter.   Recommend 150 minutes/week of aerobic exercise Low fat, low carb, high fiber diet recommended  Disposition:   FU in in 4/21, or sooner if Zio shows a significant arrhythmia   Signed, Larae Grooms, MD  08/28/2020 3:44 PM    [facility] [place], [place], [place]  [address] Phone: [phone]; Fax: [phone]

## 2020-08-28 ENCOUNTER — Ambulatory Visit: Payer: Medicare HMO | Admitting: Interventional Cardiology

## 2020-08-28 ENCOUNTER — Encounter: Payer: Self-pay | Admitting: *Deleted

## 2020-08-28 ENCOUNTER — Other Ambulatory Visit: Payer: Self-pay

## 2020-08-28 ENCOUNTER — Encounter: Payer: Self-pay | Admitting: Interventional Cardiology

## 2020-08-28 VITALS — BP 132/84 | HR 76 | Ht 60.0 in | Wt 137.4 lb

## 2020-08-28 DIAGNOSIS — E782 Mixed hyperlipidemia: Secondary | ICD-10-CM | POA: Diagnosis not present

## 2020-08-28 DIAGNOSIS — R002 Palpitations: Secondary | ICD-10-CM | POA: Diagnosis not present

## 2020-08-28 DIAGNOSIS — I700 Atherosclerosis of aorta: Secondary | ICD-10-CM | POA: Diagnosis not present

## 2020-08-28 NOTE — Patient Instructions (Signed)
Medication Instructions:  Your physician recommends that you continue on your current medications as directed. Please refer to the Current Medication list given to you today.   *If you need a refill on your cardiac medications before your next appointment, please call your pharmacy*   Lab Work: None  If you have labs (blood work) drawn today and your tests are completely normal, you will receive your results only by: Marland Kitchen MyChart Message (if you have MyChart) OR . A paper copy in the mail If you have any lab test that is abnormal or we need to change your treatment, we will call you to review the results.   Testing/Procedures: Your physician has recommended that you wear a 14 day monitor. Holter monitors are medical devices that record the heart's electrical activity. Doctors most often use these monitors to diagnose arrhythmias. Arrhythmias are problems with the speed or rhythm of the heartbeat. The monitor is a small, portable device. You can wear one while you do your normal daily activities. This is usually used to diagnose what is causing palpitations/syncope (passing out).   Follow-Up: At [facility], you and your health needs are our priority.  As part of our continuing mission to provide you with exceptional heart care, we have created designated Provider Care Teams.  These Care Teams include your primary Cardiologist (physician) and Advanced Practice Providers (APPs -  Physician Assistants and Nurse Practitioners) who all work together to provide you with the care you need, when you need it.  We recommend signing up for the patient portal called "MyChart".  Sign up information is provided on this After Visit Summary.  MyChart is used to connect with patients for Virtual Visits (Telemedicine).  Patients are able to view lab/test results, encounter notes, upcoming appointments, etc.  Non-urgent messages can be sent to your provider as well.   To learn more about what you can do with  MyChart, go to [email or web address].    Your next appointment:   Keep plan for follow up in 01/2021  The format for your next appointment:   In Person  Provider:   You may see Larae Grooms, MD or one of the following Advanced Practice Providers on your designated Care Team:    Melina Copa, PA-C  Ermalinda Barrios, PA-C    Other Instructions Bryn Gulling- Long Term Monitor Instructions   Your physician has requested you wear your ZIO patch monitor 14 days.   This is a single patch monitor.  [employer] supplies one patch monitor per enrollment.  Additional stickers are not available.   Please do not apply patch if you will be having a Nuclear Stress Test, Echocardiogram, Cardiac CT, MRI, or Chest Xray during the time frame you would be wearing the monitor. The patch cannot be worn during these tests.  You cannot remove and re-apply the ZIO XT patch monitor.   Your ZIO patch monitor will be sent USPS Priority mail from [facility] directly to your home address. The monitor may also be mailed to a PO BOX if home delivery is not available.   It may take 3-5 days to receive your monitor after you have been enrolled.   Once you have received you monitor, please review enclosed instructions.  Your monitor has already been registered assigning a specific monitor serial # to you.   Applying the monitor   Shave hair from upper left chest.   Hold abrader disc by orange tab.  Rub abrader in 40 strokes over left upper chest  as indicated in your monitor instructions.   Clean area with 4 enclosed alcohol pads .  Use all pads to assure are is cleaned thoroughly.  Let dry.   Apply patch as indicated in monitor instructions.  Patch will be place under collarbone on left side of chest with arrow pointing upward.   Rub patch adhesive wings for 2 minutes.Remove white label marked "1".  Remove white label marked "2".  Rub patch adhesive wings for 2 additional minutes.   While looking in a  mirror, press and release button in center of patch.  A small green light will flash 3-4 times .  This will be your only indicator the monitor has been turned on.     Do not shower for the first 24 hours.  You may shower after the first 24 hours.   Press button if you feel a symptom. You will hear a small click.  Record Date, Time and Symptom in the Patient Log Book.   When you are ready to remove patch, follow instructions on last 2 pages of Patient Log Book.  Stick patch monitor onto last page of Patient Log Book.   Place Patient Log Book in [place] box.  Use locking tab on box and tape box closed securely.  The [employer] and [employer] has [employer] on it.  Please place in mailbox as soon as possible.  Your physician should have your test results approximately 7 days after the monitor has been mailed back to [facility].   Call [place] at [phone] if you have questions regarding your ZIO XT patch monitor.  Call them immediately if you see an orange light blinking on your monitor.   If your monitor falls off in less than 4 days contact our Monitor department at [phone].  If your monitor becomes loose or falls off after 4 days call [employer] at [phone] for suggestions on securing your monitor.

## 2020-08-28 NOTE — Progress Notes (Signed)
Patient ID: Rita Levine, female   DOB: 1951-01-27, 69 y.o.   MRN: [id number] Patient enrolled for [employer] to ship a 14 day ZIO XT long term holter monitor to her home.

## 2020-08-30 ENCOUNTER — Ambulatory Visit (INDEPENDENT_AMBULATORY_CARE_PROVIDER_SITE_OTHER): Payer: Medicare HMO

## 2020-08-30 DIAGNOSIS — R002 Palpitations: Secondary | ICD-10-CM

## 2020-08-30 DIAGNOSIS — I700 Atherosclerosis of aorta: Secondary | ICD-10-CM

## 2020-08-30 DIAGNOSIS — E782 Mixed hyperlipidemia: Secondary | ICD-10-CM

## 2020-09-04 ENCOUNTER — Ambulatory Visit: Payer: Medicare HMO | Admitting: Podiatry

## 2020-09-04 ENCOUNTER — Other Ambulatory Visit: Payer: Self-pay

## 2020-09-04 DIAGNOSIS — L600 Ingrowing nail: Secondary | ICD-10-CM | POA: Diagnosis not present

## 2020-09-04 DIAGNOSIS — M79674 Pain in right toe(s): Secondary | ICD-10-CM

## 2020-09-04 MED ORDER — AMOXICILLIN-POT CLAVULANATE 875-125 MG PO TABS: 1.0000 | ORAL_TABLET | Freq: Two times a day (BID) | ORAL | 0 refills | Status: DC

## 2020-09-11 NOTE — Progress Notes (Signed)
Subjective: 69 year old female presents the office today for evaluation of concerns of continued discomfort after having partial nail avulsion of her right big toe performed.  She states the area is getting somewhat better although slowly and she would expect it to heal better by now.  Denies any drainage or pus. Denies any systemic complaints such as fevers, chills, nausea, vomiting. No acute changes since last appointment, and no other complaints at this time.   Objective: AAO x3, NAD DP/PT pulses palpable bilaterally, CRT less than 3 seconds Status post partial nail avulsion which is healing well however there is soft tissue growth present on the proximal nail fold which is likely causing discomfort.  There is some mild clear drainage but no purulence.  Minimal edema with faint erythema on the nail fold there is no ascending cellulitis.  There is no fluctuation or crepitation.  There is no malodor.  No open lesions otherwise. No pain with calf compression, swelling, warmth, erythema  Assessment: Status post ingrown toenail with soft tissue growth  Plan: -All treatment options discussed with the patient including all alternatives, risks, complications.  -Today I did clean the area.  Discussed with her Epsom salts soaks cover with antibiotic ointment and a bandage daily.  Prescribed Augmentin.  She i has been on this previously and without any complications.  Discussed that symptoms continue need to anesthetize the area to debride the skin. -Patient encouraged to call the office with any questions, concerns, change in symptoms.   Trula Slade DPM

## 2020-09-18 ENCOUNTER — Other Ambulatory Visit: Payer: Self-pay

## 2020-09-18 ENCOUNTER — Ambulatory Visit: Payer: Medicare HMO | Admitting: Podiatry

## 2020-09-18 MED ORDER — LOSARTAN POTASSIUM 25 MG PO TABS: 12.5000 mg | ORAL_TABLET | Freq: Every day | ORAL | 1 refills | Status: DC

## 2020-09-18 MED ORDER — MONTELUKAST SODIUM 10 MG PO TABS
ORAL_TABLET | ORAL | 1 refills | Status: DC
Start: 2020-09-18 — End: 2020-11-28

## 2020-09-23 DIAGNOSIS — R002 Palpitations: Secondary | ICD-10-CM | POA: Diagnosis not present

## 2020-09-25 ENCOUNTER — Other Ambulatory Visit: Payer: Self-pay

## 2020-09-25 NOTE — Telephone Encounter (Signed)
Requesting: zolpidem Contract:n/a UDS:n/a Last Visit:07/18/20 Next Visit:n/a Last Refill: 12/10/19 (90,1)  Please Advise

## 2020-09-26 ENCOUNTER — Other Ambulatory Visit: Payer: Self-pay

## 2020-09-26 ENCOUNTER — Telehealth: Payer: Self-pay | Admitting: Family Medicine

## 2020-09-26 ENCOUNTER — Ambulatory Visit
Admission: RE | Admit: 2020-09-26 | Discharge: 2020-09-26 | Disposition: A | Discharge status: Home / Self Care | Payer: Medicare HMO | Source: Ambulatory Visit | Attending: Family Medicine | Admitting: Family Medicine

## 2020-09-26 DIAGNOSIS — Z1231 Encounter for screening mammogram for malignant neoplasm of breast: Secondary | ICD-10-CM | POA: Diagnosis not present

## 2020-09-26 MED ORDER — ZOLPIDEM TARTRATE 5 MG PO TABS
5.0000 mg | ORAL_TABLET | Freq: Every day | ORAL | 1 refills | Status: DC
Start: 2020-09-26 — End: 2020-11-28

## 2020-09-26 MED ORDER — ZOLPIDEM TARTRATE 5 MG PO TABS
5.0000 mg | ORAL_TABLET | Freq: Every day | ORAL | 1 refills | Status: DC
Start: 2020-09-26 — End: 2020-09-26

## 2020-09-26 NOTE — Telephone Encounter (Signed)
Refilled ambien. Pt will need to be seen face to face every 6 months for this condition for refills in the furture

## 2020-09-27 ENCOUNTER — Other Ambulatory Visit: Payer: Self-pay | Admitting: Interventional Cardiology

## 2020-09-27 DIAGNOSIS — I700 Atherosclerosis of aorta: Secondary | ICD-10-CM

## 2020-09-27 DIAGNOSIS — E782 Mixed hyperlipidemia: Secondary | ICD-10-CM

## 2020-09-27 DIAGNOSIS — R002 Palpitations: Secondary | ICD-10-CM

## 2020-10-04 ENCOUNTER — Telehealth: Payer: Self-pay | Admitting: Family Medicine

## 2020-10-04 MED ORDER — FLUTICASONE PROPIONATE 50 MCG/ACT NA SUSP
2.0000 | Freq: Every day | NASAL | 0 refills | Status: DC
Start: 2020-10-04 — End: 2020-10-16

## 2020-10-04 NOTE — Telephone Encounter (Signed)
Patient requesting refill of fluticasone nasal spray. States the pharmacy told her refill was denied. Please call patient to advise.

## 2020-10-04 NOTE — Telephone Encounter (Signed)
Unable to LVM.   Pt had 6 mo supply sent in Sept. Will send for 30 days until pt makes an appt

## 2020-10-04 NOTE — Telephone Encounter (Signed)
Patient returned call. I relayed below message. Patient scheduled appt 10/16/20.

## 2020-10-12 ENCOUNTER — Other Ambulatory Visit: Payer: Self-pay

## 2020-10-16 ENCOUNTER — Encounter: Payer: Self-pay | Admitting: Family Medicine

## 2020-10-16 ENCOUNTER — Telehealth (INDEPENDENT_AMBULATORY_CARE_PROVIDER_SITE_OTHER): Payer: Medicare HMO | Admitting: Family Medicine

## 2020-10-16 ENCOUNTER — Other Ambulatory Visit: Payer: Self-pay | Admitting: Family Medicine

## 2020-10-16 VITALS — BP 117/55 | HR 74 | Temp 98.2°F | Wt 137.0 lb

## 2020-10-16 DIAGNOSIS — R250 Abnormal head movements: Secondary | ICD-10-CM

## 2020-10-16 DIAGNOSIS — R251 Tremor, unspecified: Secondary | ICD-10-CM | POA: Insufficient documentation

## 2020-10-16 HISTORY — DX: Abnormal head movements: R25.0

## 2020-10-16 MED ORDER — FLUTICASONE PROPIONATE 50 MCG/ACT NA SUSP
2.0000 | Freq: Every day | NASAL | 11 refills | Status: DC
Start: 2020-10-16 — End: 2021-11-06

## 2020-10-16 NOTE — Progress Notes (Signed)
VIRTUAL VISIT VIA VIDEO  I connected with [name] on 10/16/20 at 10:00 AM EST by elemedicine application and verified that I am speaking with the correct person using two identifiers. Location patient: Home Location provider: [facility], Office Persons participating in the virtual visit: Patient, Dr. Claiborne Billings and Paulina Fusi, CMA  I discussed the limitations of evaluation and management by telemedicine and the availability of in person appointments. The patient expressed understanding and agreed to proceed.   SUBJECTIVE Chief Complaint  Patient presents with  . Medication Refill    Flonase   . referral to neurology    HPI: Rita Levine is a 70 y.o. female present by virtual appt to discuss concern over movement disorder. Pt reports her husband noticed abnormal lip quivering and head bobbing like motions in [place] over a year ago. She had not noticed it at that time. However over the last 6 months or so she has noticed tremors of bilateral hands and right thumb "spasms". She reports her neck sometimes can feel tight or spasm as well. She adds her equilibrium "occassionalally" will fell off- with "veering" to one side or other- no falls. She feels this feature has worsened since her vertigo episodes a few years ago. Her other family members have also started to notice some abnormal movements. She has a fhx of Parkinson Disease in her father and is concerned she has PD- she is asking for a referral to neuro today.  She is prescribed BB for her palpitations.    ROS: See pertinent positives and negatives per HPI.  Patient Active Problem List   Diagnosis Date Noted  . Head movements abnormal 10/16/2020  . Tremor 10/16/2020  . Ingrown toenail 08/23/2020  . Acne 06/02/2020  . Seborrheic dermatitis of scalp 06/02/2020  . Aortic atherosclerosis (HCC) 02/03/2020  . Multiple food allergies 12/03/2019  . IBS (irritable bowel syndrome) 12/03/2019  . DDD (degenerative disc  disease), lumbar 12/03/2019  . Lumbar stenosis 12/03/2019  . Obesity (BMI 30-39.9) 12/03/2019  . Sleep disturbance 12/03/2019  . Fibromyalgia   . GERD (gastroesophageal reflux disease)   . Hypertension   . Hypothyroidism   . Osteoarthritis   . Osteopenia   . Thyroid nodule   . Seasonal allergies   . Palpitations 04/07/2014  . Mixed hyperlipidemia 04/07/2014  . Asthma, intrinsic 04/01/2011    Social History   Tobacco Use  . Smoking status: Never Smoker  . Smokeless tobacco: Never Used  Substance Use Topics  . Alcohol use: Yes    Alcohol/week: 0.0 standard drinks    Comment: occasionally    Current Outpatient Medications:  .  albuterol (PROAIR HFA) 108 (90 Base) MCG/ACT inhaler, Inhale 2 puffs into the lungs every 6 (six) hours as needed., Disp: 6.7 g, Rfl: 5 .  ALLERGY RELIEF 180 MG tablet, TAKE ONE TABLET BY MOUTH EVERY DAY, Disp: 90 tablet, Rfl: 0 .  ARMOUR THYROID 15 MG tablet, Take 1 tablet (15 mg total) by mouth daily., Disp: 90 tablet, Rfl: 2 .  ARMOUR THYROID 30 MG tablet, Take 1 tablet (30 mg total) by mouth daily before breakfast., Disp: 90 tablet, Rfl: 2 .  Ascorbic Acid (VITAMIN C) 1000 MG tablet, Take 3,000 mg by mouth daily., Disp: , Rfl:  .  atenolol (TENORMIN) 25 MG tablet, Take 1 tablet (25 mg total) by mouth daily., Disp: 90 tablet, Rfl: 1 .  calcium carbonate (TUMS - DOSED IN MG ELEMENTAL CALCIUM) 500 MG chewable tablet, Chew 3 tablets  by mouth daily as needed for indigestion or heartburn., Disp: , Rfl:  .  Carboxymethylcellulose Sodium (THERATEARS OP), Place 1 drop into both eyes 2 (two) times daily., Disp: , Rfl:  .  Cholecalciferol (VITAMIN D) 125 MCG (5000 UT) CAPS, Take 5,000 Units by mouth daily. , Disp: , Rfl:  .  Coenzyme Q10 (COQ-10) 100 MG CAPS, Take 100 mg by mouth daily., Disp: , Rfl:  .  diclofenac (VOLTAREN) 75 MG EC tablet, Take 1 tablet (75 mg total) by mouth 2 (two) times daily., Disp: 60 tablet, Rfl: 5 .  ezetimibe (ZETIA) 10 MG tablet, Take 1  tablet (10 mg total) by mouth daily., Disp: 90 tablet, Rfl: 3 .  famotidine (PEPCID) 20 MG tablet, TAKE 1 TABLET BY MOUTH EVERY DAY, Disp: 90 tablet, Rfl: 1 .  hyoscyamine (LEVSIN) 0.125 MG tablet, Take 1 tablet (0.125 mg total) by mouth every 6 (six) hours as needed., Disp: 120 tablet, Rfl: 1 .  ibuprofen (ADVIL) 800 MG tablet, Take 1 tablet (800 mg total) by mouth every 8 (eight) hours as needed., Disp: 30 tablet, Rfl: 0 .  levocetirizine (XYZAL) 5 MG tablet, Take 5 mg by mouth every evening., Disp: , Rfl:  .  losartan (COZAAR) 25 MG tablet, Take 0.5-1 tablets (12.5-25 mg total) by mouth daily., Disp: 90 tablet, Rfl: 1 .  mometasone (ELOCON) 0.1 % lotion, Apply 1 application topically daily as needed (ear irritation). , Disp: , Rfl:  .  montelukast (SINGULAIR) 10 MG tablet, TAKE 1 TABLET BY MOUTH EVERYDAY AT BEDTIME, Disp: 90 tablet, Rfl: 1 .  mupirocin ointment (BACTROBAN) 2 %, Apply 1 application topically 2 (two) times daily., Disp: 30 g, Rfl: 2 .  ondansetron (ZOFRAN-ODT) 8 MG disintegrating tablet, Take 1 tablet (8 mg total) by mouth every 8 (eight) hours as needed for nausea or vomiting., Disp: 30 tablet, Rfl: 1 .  pantoprazole (PROTONIX) 40 MG tablet, TAKE 1 TABLET BY MOUTH EVERY DAY, Disp: 90 tablet, Rfl: 1 .  Probiotic CAPS, Take 1 capsule by mouth daily., Disp: , Rfl:  .  rosuvastatin (CRESTOR) 10 MG tablet, TAKE 1 TABLET ONCE A WEEK FOR 1 WEEK, THEN INCREASE TO TAKING 1 TABLET TWICE A WEEK, Disp: 26 tablet, Rfl: 2 .  SYMBICORT 80-4.5 MCG/ACT inhaler, INHALE 2 PUFFS INTO THE LUNGS IN THE MORNING AND AT BEDTIME., Disp: 30.6 Inhaler, Rfl: 1 .  tiZANidine (ZANAFLEX) 4 MG tablet, Take 1 tablet (4 mg total) by mouth every 6 (six) hours as needed for muscle spasms., Disp: 120 tablet, Rfl: 5 .  VITAMIN A PO, Take 1 tablet by mouth daily., Disp: , Rfl:  .  vitamin E 1000 UNIT capsule, Take 1,000 Units by mouth daily., Disp: , Rfl:  .  zinc gluconate 50 MG tablet, Take 100 mg by mouth daily.,  Disp: , Rfl:  .  zolpidem (AMBIEN) 5 MG tablet, Take 1 tablet (5 mg total) by mouth at bedtime., Disp: 90 tablet, Rfl: 1 .  fluticasone (FLONASE) 50 MCG/ACT nasal spray, Place 2 sprays into both nostrils daily., Disp: 16 mL, Rfl: 11  Allergies  Allergen Reactions  . Sulfamethoxazole-Trimethoprim Hives  . Avelox [Moxifloxacin Hcl In Nacl]     Racing heart  . Cephalexin Hives  . Codeine Hives    Heart racing  . Erythromycin Hives  . Flagyl [Metronidazole]     Unknown reaction  . Propranolol Hives  . Sulfa Antibiotics Hives  . Tetanus Toxoid Swelling    Reports a fever and headache with  swelling.   . Tramadol Hives  . Statins Other (See Comments)    Myalgia     OBJECTIVE: BP (!) 117/55   Pulse 74   Temp 98.2 F (36.8 C)   Wt 137 lb (62.1 kg)   BMI 26.76 kg/m  Gen: No acute distress. Nontoxic in appearance.  HENT: AT. [place].  MMM.  Eyes:Pupils Equal Round Reactive to light, Extraocular movements intact,  Conjunctiva without redness, discharge or icterus. Neuro: Alert. Oriented x3  Psych: Normal affect and demeanor. Normal speech. Normal thought content and judgment.  ASSESSMENT AND PLAN: DIONA RUISI is a 70 y.o. female present for  Tremor/Head movements abnormal Newer onset abnormal head movements and hand tremor over the last year> worsening last 6 mos, fhx of PD in father.  - discussed options with her today and she would like evaluation by neurology rather than starting work up > referral made to Dr. Carles Collet - neuro   Howard Pouch, DO 10/16/2020   Return if symptoms worsen or fail to improve.  Orders Placed This Encounter  Procedures  . Ambulatory referral to Neurology   Meds ordered this encounter  Medications  . fluticasone (FLONASE) 50 MCG/ACT nasal spray    Sig: Place 2 sprays into both nostrils daily.    Dispense:  16 mL    Refill:  11    Referral Orders     Ambulatory referral to Neurology

## 2020-10-16 NOTE — Patient Instructions (Signed)
Dystonia Dystonia is a condition that makes your muscles contract without warning (muscle spasms). It can make doing everyday tasks hard. There are different forms of dystonia. The condition can affect just one part of your body, or it can affect different parts of your body. Dystonia affects people in different ways. In some people, it is mild and goes away over time. In others, it is severe and may need treatment. Although there is no cure for dystonia, you can manage the condition with treatment. What are the causes? This condition may be present at birth. In this case, it is caused by:  Genetics. This means you inherited genes that lead to abnormal muscle contractions.  Abnormal basal ganglia function. This is a defect in the part of the brain that controls movement. The condition may also occur after birth (acquired). In this case, you develop the condition following:  Brain injury.  Infection.  Drug reaction. Sometimes the cause of dystonia is not known (idiopathic dystonia). What are the signs or symptoms? Symptoms of this condition depend on which type of dystonia you have. Common signs and symptoms include:  Muscle twitches or spasms around your eyes (blepharospasm).  Foot cramping or dragging.  Pulling of your neck to one side (torticollis).  Muscles spasms of the face.  Spasms of the voice box (larynx).  Tremors.  Muscle cramping after activity. How is this diagnosed? This condition may be diagnosed based on:  Symptoms and medical history.  Physical exam. You may also have other tests, including:  A blood test to check for genes that cause dystonia.  Brain imaging tests to rule out other causes of your symptoms. How is this treated? There are no treatments that can cure or prevent dystonia. Treatment to manage dystonia may include:  Taking medicines to relax muscles.  Using a heating pad, or receiving massage or physical therapy.  Taking a medicine that is  used to treat Parkinson's disease. This medicine improves symptoms of dystonia.  Injecting the affected muscles with a chemical (botulinum) that blocks muscle spasms. This treatment can block spasms for a few days to a few months.  Slowly weaning you from a specific medicine to see if your symptoms improve. This may be done if that medicine is thought to be causing dystonia.  Having surgery to implant an electrical device (deep brain simulator) to help override abnormal signals being sent to your muscles. This is done in severe cases. Follow these instructions at home:   Do physical therapy exercises at home as instructed by your physical therapist.  Make sure that you have a good support system. Let your health care provider know if you are struggling with stress or anxiety.  Do not drink alcohol.  Do not use any products that contain nicotine or tobacco, such as cigarettes and e-cigarettes. If you need help quitting, ask your health care provider.  Do not drive or operate heavy machinery until your health care provider approves.  Take over-the-counter and prescription medicines only as told by your health care provider.  Keep all follow-up visits as told by your health care provider. This is important. Contact a health care provider if:  Your condition is changing or getting worse.  You need more support taking care of yourself or handling household duties. Your health care provider may be able to arrange nursing assistance in your home. Summary  Dystonia is a condition that makes your muscles contract without warning (muscle spasms).  There are no treatments that can cure   or prevent dystonia. Medicines may be prescribed to manage symptoms.  Physical therapy may also be recommended to help maintain muscle strength and improve your balance. This information is not intended to replace advice given to you by your health care provider. Make sure you discuss any questions you have  with your health care provider. Document Revised: 08/19/2018 Document Reviewed: 11/04/2017 Elsevier Patient Education  2020 [employer] Inc[employer]  

## 2020-10-18 ENCOUNTER — Encounter: Payer: Self-pay | Admitting: Neurology

## 2020-10-18 NOTE — Progress Notes (Signed)
Assessment/Plan:   1.  Tremor  -Unfortunately, I did not see any tremor on examination today, either rest tremor or otherwise.  I did not see any head or lip tremor (certainly does not mean that these do not exist).  I did feel some tremulousness when I had my hands on her arms and she was otherwise distracted.  Regardless, I reassured her that I did not see any evidence of Parkinson's disease.  She does not meet [place] brain bank or modified MDS criteria for the diagnosis.  I discussed with the patient that I really would not recommend any treatment right now, and she did not disagree.  Discussed that treatments really do not help head/mouth tremor.  Discussed that albuterol could worsen the tremor.  Subjective:   Rita Levine was seen today in the movement disorders clinic for neurologic consultation at the request of Kuneff, Renee A, DO.  The consultation is for the evaluation of tremor.  Medical records made available with me reviewed.  Patient had states that her husband noticed lip quivering and head tremor about a year ago, but she did not notice it.  About 6 months ago, she started to notice some tremor in the right thumb and tremor of both hands.  Father had a history of Parkinson's disease and she was concerned and referred to neurology for further evaluation.  Tremor: Yes.     How long has it been going on? R hand x 6 months in thumb but notes tremor in both hands  At rest or with activation?  texting   Fam hx of tremor?  Yes.  Father with Parkinson's disease  Located where?  Mouth/lips; R thumb; both hands; son noted some head movement at Christmas  Affected by caffeine:   (drinks very little - 1/2 caff coffee in the AM)  Affected by alcohol:  Doesn't drink enough to know if that would affect tremor  Affected by stress:  No.  Affected by fatigue:  No.  Spills soup if on spoon:  No.  Spills glass of liquid if full:  No.  Affects ADL's (tying shoes, brushing teeth, etc):  No.  Tremor  inducing meds:  Yes, albuterol 2 days per week  Other Specific Symptoms:  Voice: more hoarseness but relates to drainage issue x 2 years Sleep: sleeps well  Vivid Dreams:  No.  Acting out dreams:  No. Wet Pillows: No. Postural symptoms:  Yes.  , not great since bout of vertigo 25 years ago (hx of bppv)  Falls?  No. Bradykinesia symptoms: no bradykinesia noted Loss of smell:  No. Loss of taste:  No. Urinary Incontinence:  No. Difficulty Swallowing:  No. Handwriting, micrographia: No. Trouble with ADL's:  No.  Trouble buttoning clothing: No. Depression:  No. Memory changes:  No. except for "brain fog associated with fibromyalgia."  States that this was dx in 1992 N/V:  Yes.  , better after cholecystectomy in october Lightheaded:  Yes.   only if BP low  Syncope: No. Diplopia:  No. Dyskinesia:  No.  Neuroimaging of the brain has not previously been performed.     ALLERGIES:   Allergies  Allergen Reactions  . Sulfamethoxazole-Trimethoprim Hives  . Avelox [Moxifloxacin Hcl In Nacl]     Racing heart  . Cephalexin Hives  . Codeine Hives    Heart racing  . Erythromycin Hives  . Flagyl [Metronidazole]     Unknown reaction  . Propranolol Hives  . Sulfa Antibiotics Hives  . Tetanus Toxoid  Swelling    Reports a fever and headache with swelling.   . Tramadol Hives  . Statins Other (See Comments)    Myalgia     CURRENT MEDICATIONS:  Current Outpatient Medications  Medication Instructions  . albuterol (PROAIR HFA) 108 (90 Base) MCG/ACT inhaler 2 puffs, Inhalation, Every 6 hours PRN  . ALLERGY RELIEF 180 MG tablet TAKE ONE TABLET BY MOUTH EVERY DAY  . Armour Thyroid 30 mg, Oral, Daily before breakfast  . Armour Thyroid 15 mg, Oral, Daily  . atenolol (TENORMIN) 25 mg, Oral, Daily  . calcium carbonate (TUMS - DOSED IN MG ELEMENTAL CALCIUM) 500 MG chewable tablet 3 tablets, Oral, Daily PRN  . Carboxymethylcellulose Sodium (THERATEARS OP) 1 drop, Both Eyes, 2 times daily  .  CoQ-10 100 mg, Oral, Daily  . diclofenac (VOLTAREN) 75 mg, Oral, 2 times daily  . ezetimibe (ZETIA) 10 mg, Oral, Daily  . famotidine (PEPCID) 20 MG tablet TAKE 1 TABLET BY MOUTH EVERY DAY  . fluticasone (FLONASE) 50 MCG/ACT nasal spray 2 sprays, Each Nare, Daily  . hyoscyamine (LEVSIN) 0.125 mg, Oral, Every 6 hours PRN  . ibuprofen (ADVIL) 800 mg, Oral, Every 8 hours PRN  . levocetirizine (XYZAL) 5 mg, Oral, Every evening  . losartan (COZAAR) 12.5-25 mg, Oral, Daily  . mometasone (ELOCON) 0.1 % lotion 1 application, Topical, Daily PRN  . montelukast (SINGULAIR) 10 MG tablet TAKE 1 TABLET BY MOUTH EVERYDAY AT BEDTIME  . mupirocin ointment (BACTROBAN) 2 % 1 application, Topical, 2 times daily  . ondansetron (ZOFRAN-ODT) 8 mg, Oral, Every 8 hours PRN  . pantoprazole (PROTONIX) 40 MG tablet TAKE 1 TABLET BY MOUTH EVERY DAY  . Probiotic CAPS 1 capsule, Oral, Daily  . rosuvastatin (CRESTOR) 10 MG tablet TAKE 1 TABLET ONCE A WEEK FOR 1 WEEK, THEN INCREASE TO TAKING 1 TABLET TWICE A WEEK  . SYMBICORT 80-4.5 MCG/ACT inhaler INHALE 2 PUFFS INTO THE LUNGS IN THE MORNING AND AT BEDTIME.  Marland Kitchen tiZANidine (ZANAFLEX) 4 mg, Oral, Every 6 hours PRN  . VITAMIN A PO 1 tablet, Oral, Daily  . vitamin C 3,000 mg, Oral, Daily  . Vitamin D 5,000 Units, Oral, Daily  . vitamin E 1,000 Units, Oral, Daily  . zinc gluconate 100 mg, Oral, Daily  . zolpidem (AMBIEN) 5 mg, Oral, Daily at bedtime    Objective:   PHYSICAL EXAMINATION:    VITALS:   Vitals:   10/23/20 0949 10/23/20 0953  BP:  130/72  SpO2: 98%   Weight: 140 lb (63.5 kg)   Height: 5' (1.524 m)     GEN:  The patient appears stated age and is in NAD. HEENT:  Normocephalic, atraumatic.  The mucous membranes are moist. The superficial temporal arteries are without ropiness or tenderness. CV:  RRR Lungs:  CTAB Neck/HEME:  There are no carotid bruits bilaterally.  Neurological examination:  Orientation: The patient is alert and oriented x3.   Cranial nerves: There is good facial symmetry.  Extraocular muscles are intact. The visual fields are full to confrontational testing. The speech is fluent and clear. Soft palate rises symmetrically and there is no tongue deviation. Hearing is intact to conversational tone. Sensation: Sensation is intact to light touch throughout (facial, trunk, extremities). Vibration is intact at the bilateral big toe. There is no extinction with double simultaneous stimulation.  Motor: Strength is 5/5 in the bilateral upper and lower extremities.   Shoulder shrug is equal and symmetric.  There is no pronator drift. Deep tendon reflexes:  Deep tendon reflexes are 2/4 at the bilateral biceps, triceps, brachioradialis, patella and achilles. Plantar responses are downgoing bilaterally.  Movement examination: Tone: There is normal tone in the bilateral upper extremities.  The tone in the lower extremities is normal.  Abnormal movements: No rest tremor.  Tremulousness can be followed in the arms when distracted.  She has no trouble with Archimedes spirals no intention tremor.  No tremor given her weight.  While she does spill some water when asked apart from 1 glass to another, this was less because of tremor and more because of slight carelessness. Coordination:  There is no decremation with RAM's, with any form of RAMS, including alternating supination and pronation of the forearm, hand opening and closing, finger taps, heel taps and toe taps. Gait and Station: The patient has no difficulty arising out of a deep-seated chair without the use of the hands. The patient's stride length is good.  I have reviewed and interpreted the following labs independently   Chemistry      Component Value Date/Time   NA 140 07/21/2020 0900   K 3.9 07/21/2020 0900   CL 107 07/21/2020 0900   CO2 26 07/21/2020 0900   BUN 9 07/21/2020 0900   CREATININE 0.67 07/21/2020 0900   CREATININE 0.68 12/29/2019 1112      Component Value  Date/Time   CALCIUM 9.3 07/21/2020 0900   ALKPHOS 65 07/21/2020 0900   AST 20 07/21/2020 0900   ALT 18 07/21/2020 0900   BILITOT 0.2 (L) 07/21/2020 0900   BILITOT 0.3 04/05/2020 0855      Lab Results  Component Value Date   TSH 1.14 07/18/2020   Lab Results  Component Value Date   WBC 6.6 07/18/2020   HGB 12.3 07/18/2020   HCT 37.2 07/18/2020   MCV 82.6 07/18/2020   PLT 287.0 07/18/2020      Total time spent on today's visit was 45 minutes, including both face-to-face time and nonface-to-face time.  Time included that spent on review of records (prior notes available to me/labs/imaging if pertinent), discussing treatment and goals, answering patient's questions and coordinating care.  Cc:  Kuneff, Renee A, DO

## 2020-10-23 ENCOUNTER — Other Ambulatory Visit: Payer: Self-pay

## 2020-10-23 ENCOUNTER — Ambulatory Visit: Payer: Medicare HMO | Admitting: Neurology

## 2020-10-23 ENCOUNTER — Encounter: Payer: Self-pay | Admitting: Neurology

## 2020-10-23 VITALS — BP 130/72 | Ht 60.0 in | Wt 140.0 lb

## 2020-10-23 DIAGNOSIS — R251 Tremor, unspecified: Secondary | ICD-10-CM | POA: Diagnosis not present

## 2020-10-23 NOTE — Patient Instructions (Signed)
Let me know if tremor increases or new neurologic symptoms arise.  It was good to see you today!  The physicians and staff at [facility] are committed to providing excellent care. You may receive a survey requesting feedback about your experience at our office. We strive to receive "very good" responses to the survey questions. If you feel that your experience would prevent you from giving the office a "very good " response, please contact our office to try to remedy the situation. We may be reached at [phone]. Thank you for taking the time out of your busy day to complete the survey.

## 2020-10-24 ENCOUNTER — Telehealth (INDEPENDENT_AMBULATORY_CARE_PROVIDER_SITE_OTHER): Payer: Medicare HMO | Admitting: Family Medicine

## 2020-10-24 ENCOUNTER — Encounter: Payer: Self-pay | Admitting: Family Medicine

## 2020-10-24 VITALS — BP 134/76 | HR 59 | Temp 98.6°F | Wt 136.3 lb

## 2020-10-24 DIAGNOSIS — J029 Acute pharyngitis, unspecified: Secondary | ICD-10-CM

## 2020-10-24 DIAGNOSIS — R059 Cough, unspecified: Secondary | ICD-10-CM

## 2020-10-24 LAB — POCT INFLUENZA A/B
Influenza A, POC: NEGATIVE
Influenza B, POC: NEGATIVE

## 2020-10-24 LAB — POCT RAPID STREP A (OFFICE): Rapid Strep A Screen: NEGATIVE

## 2020-10-24 MED ORDER — PREDNISONE 20 MG PO TABS: ORAL_TABLET | ORAL | 0 refills | Status: DC

## 2020-10-24 MED ORDER — DOXYCYCLINE HYCLATE 100 MG PO TABS: 100.0000 mg | ORAL_TABLET | Freq: Two times a day (BID) | ORAL | 0 refills | Status: DC

## 2020-10-24 NOTE — Progress Notes (Signed)
VIRTUAL VISIT VIA VIDEO  I connected with Rita Levine on 10/24/20 at  4:00 PM EST by elemedicine application and verified that I am speaking with the correct person using two identifiers. Location patient: Home Location provider: [facility], Office Persons participating in the virtual visit: Patient, Dr. Raoul Pitch and Samul Dada, CMA  I discussed the limitations of evaluation and management by telemedicine and the availability of in person appointments. The patient expressed understanding and agreed to proceed.   SUBJECTIVE Chief Complaint  Patient presents with  . Cough    Pt c/o cough, sore throat and post nasal drip x 2 days    HPI: Rita Levine is a 70 y.o. female present for acute illness started 2 days ago with cough, PND and sore throat. She endorses red throat and productive cough.She denies shortness of breath or chest burning.  She denies n,v,d rash or loss of taste/smell She has had her flu vaccine.  covid vaccines declined.  Exposure none- her husband has a cold- not tested.  OTC- mucinex and lavage. She has continued her allergy medications.  ROS: See pertinent positives and negatives per HPI.  Patient Active Problem List   Diagnosis Date Noted  . Head movements abnormal 10/16/2020  . Tremor 10/16/2020  . Ingrown toenail 08/23/2020  . Acne 06/02/2020  . Seborrheic dermatitis of scalp 06/02/2020  . Aortic atherosclerosis ([place]) 02/03/2020  . Multiple food allergies 12/03/2019  . IBS (irritable bowel syndrome) 12/03/2019  . DDD (degenerative disc disease), lumbar 12/03/2019  . Lumbar stenosis 12/03/2019  . Obesity (BMI 30-39.9) 12/03/2019  . Sleep disturbance 12/03/2019  . Fibromyalgia   . GERD (gastroesophageal reflux disease)   . Hypertension   . Hypothyroidism   . Osteoarthritis   . Osteopenia   . Thyroid nodule   . Seasonal allergies   . Palpitations 04/07/2014  . Mixed hyperlipidemia 04/07/2014  . Asthma, intrinsic 04/01/2011    Social  History   Tobacco Use  . Smoking status: Never Smoker  . Smokeless tobacco: Never Used  Substance Use Topics  . Alcohol use: Yes    Alcohol/week: 0.0 standard drinks    Comment: occasionally    Current Outpatient Medications:  .  albuterol (PROAIR HFA) 108 (90 Base) MCG/ACT inhaler, Inhale 2 puffs into the lungs every 6 (six) hours as needed., Disp: 6.7 g, Rfl: 5 .  ALLERGY RELIEF 180 MG tablet, TAKE ONE TABLET BY MOUTH EVERY DAY, Disp: 90 tablet, Rfl: 0 .  ARMOUR THYROID 15 MG tablet, Take 1 tablet (15 mg total) by mouth daily., Disp: 90 tablet, Rfl: 2 .  ARMOUR THYROID 30 MG tablet, Take 1 tablet (30 mg total) by mouth daily before breakfast., Disp: 90 tablet, Rfl: 2 .  Ascorbic Acid (VITAMIN C) 1000 MG tablet, Take 3,000 mg by mouth daily., Disp: , Rfl:  .  atenolol (TENORMIN) 25 MG tablet, Take 1 tablet (25 mg total) by mouth daily., Disp: 90 tablet, Rfl: 1 .  calcium carbonate (TUMS - DOSED IN MG ELEMENTAL CALCIUM) 500 MG chewable tablet, Chew 3 tablets by mouth daily as needed for indigestion or heartburn., Disp: , Rfl:  .  Carboxymethylcellulose Sodium (THERATEARS OP), Place 1 drop into both eyes 2 (two) times daily., Disp: , Rfl:  .  Cholecalciferol (VITAMIN D) 125 MCG (5000 UT) CAPS, Take 5,000 Units by mouth daily. , Disp: , Rfl:  .  Coenzyme Q10 (COQ-10) 100 MG CAPS, Take 100 mg by mouth daily., Disp: , Rfl:  .  diclofenac (VOLTAREN) 75 MG EC tablet, Take 1 tablet (75 mg total) by mouth 2 (two) times daily., Disp: 60 tablet, Rfl: 5 .  doxycycline (VIBRA-TABS) 100 MG tablet, Take 1 tablet (100 mg total) by mouth 2 (two) times daily., Disp: 20 tablet, Rfl: 0 .  ezetimibe (ZETIA) 10 MG tablet, Take 1 tablet (10 mg total) by mouth daily., Disp: 90 tablet, Rfl: 3 .  famotidine (PEPCID) 20 MG tablet, TAKE 1 TABLET BY MOUTH EVERY DAY, Disp: 90 tablet, Rfl: 1 .  fluticasone (FLONASE) 50 MCG/ACT nasal spray, Place 2 sprays into both nostrils daily., Disp: 16 mL, Rfl: 11 .  hyoscyamine  (LEVSIN) 0.125 MG tablet, Take 1 tablet (0.125 mg total) by mouth every 6 (six) hours as needed., Disp: 120 tablet, Rfl: 1 .  ibuprofen (ADVIL) 800 MG tablet, Take 1 tablet (800 mg total) by mouth every 8 (eight) hours as needed., Disp: 30 tablet, Rfl: 0 .  levocetirizine (XYZAL) 5 MG tablet, Take 5 mg by mouth every evening., Disp: , Rfl:  .  losartan (COZAAR) 25 MG tablet, Take 0.5-1 tablets (12.5-25 mg total) by mouth daily. (Patient taking differently: Take 12.5-25 mg by mouth daily. As needed), Disp: 90 tablet, Rfl: 1 .  mometasone (ELOCON) 0.1 % lotion, Apply 1 application topically daily as needed (ear irritation). , Disp: , Rfl:  .  montelukast (SINGULAIR) 10 MG tablet, TAKE 1 TABLET BY MOUTH EVERYDAY AT BEDTIME, Disp: 90 tablet, Rfl: 1 .  mupirocin ointment (BACTROBAN) 2 %, Apply 1 application topically 2 (two) times daily., Disp: 30 g, Rfl: 2 .  ondansetron (ZOFRAN-ODT) 8 MG disintegrating tablet, Take 1 tablet (8 mg total) by mouth every 8 (eight) hours as needed for nausea or vomiting., Disp: 30 tablet, Rfl: 1 .  pantoprazole (PROTONIX) 40 MG tablet, TAKE 1 TABLET BY MOUTH EVERY DAY, Disp: 90 tablet, Rfl: 1 .  predniSONE (DELTASONE) 20 MG tablet, 60 mg x3d, 40 mg x3d, 20 mg x2d, 10 mg x2d, Disp: 18 tablet, Rfl: 0 .  Probiotic CAPS, Take 1 capsule by mouth daily., Disp: , Rfl:  .  rosuvastatin (CRESTOR) 10 MG tablet, TAKE 1 TABLET ONCE A WEEK FOR 1 WEEK, THEN INCREASE TO TAKING 1 TABLET TWICE A WEEK, Disp: 26 tablet, Rfl: 2 .  SYMBICORT 80-4.5 MCG/ACT inhaler, INHALE 2 PUFFS INTO THE LUNGS IN THE MORNING AND AT BEDTIME., Disp: 30.6 Inhaler, Rfl: 1 .  tiZANidine (ZANAFLEX) 4 MG tablet, Take 1 tablet (4 mg total) by mouth every 6 (six) hours as needed for muscle spasms., Disp: 120 tablet, Rfl: 5 .  VITAMIN A PO, Take 1 tablet by mouth daily., Disp: , Rfl:  .  vitamin E 1000 UNIT capsule, Take 1,000 Units by mouth daily., Disp: , Rfl:  .  zinc gluconate 50 MG tablet, Take 100 mg by mouth  daily., Disp: , Rfl:  .  zolpidem (AMBIEN) 5 MG tablet, Take 1 tablet (5 mg total) by mouth at bedtime. (Patient taking differently: Take 5 mg by mouth at bedtime. As needed), Disp: 90 tablet, Rfl: 1  Allergies  Allergen Reactions  . Sulfamethoxazole-Trimethoprim Hives  . Avelox [Moxifloxacin Hcl In Nacl]     Racing heart  . Cephalexin Hives  . Codeine Hives    Heart racing  . Erythromycin Hives  . Flagyl [Metronidazole]     Unknown reaction  . Propranolol Hives  . Sulfa Antibiotics Hives  . Tetanus Toxoid Swelling    Reports a fever and headache with swelling.   Marland Kitchen  Tramadol Hives  . Statins Other (See Comments)    Myalgia     OBJECTIVE: BP 134/76   Pulse (!) 59   Temp 98.6 F (37 C) (Oral)   Wt 136 lb 4.8 oz (61.8 kg)   BMI 26.62 kg/m  Gen: No acute distress. Nontoxic in appearance.  HENT: AT. [place].  MMM.  Eyes:Pupils Equal Round Reactive to light, Extraocular movements intact,  Conjunctiva without redness, discharge or icterus. Chest: Cough present on exam, no shortness of breath Skin: no rashes, purpura or petechiae.  Neuro: Alert. Oriented x3   ASSESSMENT AND PLAN: Rita Levine is a 70 y.o. female present for  Sore throat/ Cough/Pharyngitis, unspecified etiology Poss viral illness vs bacterial. Discussed testing and abx use. Pt understands to start abx if symptoms worsen or testing result indicates need.  Rest, hydrate.  Continue mucinex (DM if cough), nettie pot or nasal saline.  Doxy/pred prescribed, take until completed.  Arranged drive up testing today. Pt will be called POCT results today and other results once received.  - Novel Coronavirus, NAA ([facility]) - POCT Influenza A/B - POCT rapid strep A - Upper Respiratory Culture F/U 2 weeks if not improved- sooner if worsening.     Howard Pouch, DO 10/24/2020   Return if symptoms worsen or fail to improve.  Orders Placed This Encounter  Procedures  . Novel Coronavirus, NAA ([facility])  . Upper  Respiratory Culture  . POCT Influenza A/B  . POCT rapid strep A   Meds ordered this encounter  Medications  . doxycycline (VIBRA-TABS) 100 MG tablet    Sig: Take 1 tablet (100 mg total) by mouth 2 (two) times daily.    Dispense:  20 tablet    Refill:  0  . predniSONE (DELTASONE) 20 MG tablet    Sig: 60 mg x3d, 40 mg x3d, 20 mg x2d, 10 mg x2d    Dispense:  18 tablet    Refill:  0   Referral Orders  No referral(s) requested today

## 2020-10-26 ENCOUNTER — Other Ambulatory Visit: Payer: Self-pay | Admitting: Family Medicine

## 2020-10-26 LAB — CULTURE, UPPER RESPIRATORY
MICRO NUMBER:: 11404934
SPECIMEN QUALITY:: ADEQUATE

## 2020-10-26 LAB — NOVEL CORONAVIRUS, NAA

## 2020-10-31 ENCOUNTER — Telehealth: Payer: Self-pay | Admitting: Family Medicine

## 2020-10-31 NOTE — Telephone Encounter (Signed)
Spoke with pt regarding sx. Pt declined UC. Pt was seen 1/11 and would like new abx Augmentin sent to pharmacy. Pt states that the mucus production has became slightly thicker but no new sx. Please advise

## 2020-10-31 NOTE — Telephone Encounter (Signed)
Patient had VV with Dr. Raoul Pitch 10/24/20 and was given doxycyline and prednisone for sinus symptoms. Today, patient states she still has sinus infection symptoms, antibiotic was not strong enough. Patient states Augmentin usually takes care of her sinus infections. Please call patient and prescribe if appropriate.

## 2020-10-31 NOTE — Telephone Encounter (Signed)
LVM for pt to CB regarding prior message.  Note: pt will need to be reevaluated. Pt can go to UC or resched an appt.

## 2020-11-01 MED ORDER — AMOXICILLIN-POT CLAVULANATE 875-125 MG PO TABS: 1.0000 | ORAL_TABLET | Freq: Two times a day (BID) | ORAL | 0 refills | Status: DC

## 2020-11-01 NOTE — Telephone Encounter (Signed)
LVM for pt to CB regarding med info and to inform pt rx has been sent.

## 2020-11-01 NOTE — Telephone Encounter (Signed)
Spoke with pt and informed her rx and instructions

## 2020-11-01 NOTE — Telephone Encounter (Signed)
I have called in Augmentin.   Doxycycline and Augmentin are of equal strength and have similar coverage, as far as particular bacteria.  There can be a few reasons why Rita Levine did not feel the doxycycline worked.          1.  if it is a viral illness antibiotics are not effective.  If Rita Levine did have COVID, or any other virus, for example.      2.  symptoms can remain after sinusitis/bronchitis infection  a few weeks after antibiotics-even though it is appropriately treated and the patient is no longer infectious.  This leads to patient's thinking the second antibiotic worked better than the first, when actually they were no longer infectious and there is needed more time to recuperate.   3.  Lastly, on a few rare occasions the first antibiotic did not give full coverage.  This is possible.  If any further symptoms Rita Levine will need an appointment to further evaluate.

## 2020-11-13 ENCOUNTER — Other Ambulatory Visit: Payer: Self-pay | Admitting: Family Medicine

## 2020-11-14 ENCOUNTER — Telehealth: Payer: Self-pay | Admitting: Neurology

## 2020-11-14 NOTE — Telephone Encounter (Signed)
Left message for patient to contact office.

## 2020-11-14 NOTE — Telephone Encounter (Signed)
I'm not sure what she means.  I didn't see tremor on her exam that day although that doesn't mean it doesn't exist.  I would probably give it 6 months or so and let me re-evaluate that and see if we can visualize it in the office.  Its difficult to tx if I cannot see it

## 2020-11-14 NOTE — Telephone Encounter (Signed)
Patient said she is interested in exploring the cause of the tremors. She said she was told to call back once she'd given it some thought.

## 2020-11-15 NOTE — Telephone Encounter (Signed)
Patient returned call to Tee. 

## 2020-11-15 NOTE — Telephone Encounter (Signed)
Spoke with patient who states her tremors are getting worse. She states she and her spouse have noticed them. I advised patient to record the her tremors and to follow up with Dr Tat in six months. Patient voiced understanding.   Patient transferred to the front to schedule an appointment.

## 2020-11-28 ENCOUNTER — Other Ambulatory Visit: Payer: Self-pay

## 2020-11-28 ENCOUNTER — Encounter: Payer: Self-pay | Admitting: Family Medicine

## 2020-11-28 ENCOUNTER — Ambulatory Visit (INDEPENDENT_AMBULATORY_CARE_PROVIDER_SITE_OTHER): Payer: Medicare HMO | Admitting: Family Medicine

## 2020-11-28 VITALS — BP 126/74 | HR 93 | Temp 99.0°F | Ht 58.25 in | Wt 139.0 lb

## 2020-11-28 DIAGNOSIS — E782 Mixed hyperlipidemia: Secondary | ICD-10-CM | POA: Diagnosis not present

## 2020-11-28 DIAGNOSIS — M797 Fibromyalgia: Secondary | ICD-10-CM

## 2020-11-28 DIAGNOSIS — M8580 Other specified disorders of bone density and structure, unspecified site: Secondary | ICD-10-CM | POA: Diagnosis not present

## 2020-11-28 DIAGNOSIS — E039 Hypothyroidism, unspecified: Secondary | ICD-10-CM | POA: Diagnosis not present

## 2020-11-28 DIAGNOSIS — R002 Palpitations: Secondary | ICD-10-CM

## 2020-11-28 DIAGNOSIS — J302 Other seasonal allergic rhinitis: Secondary | ICD-10-CM | POA: Diagnosis not present

## 2020-11-28 DIAGNOSIS — Z1231 Encounter for screening mammogram for malignant neoplasm of breast: Secondary | ICD-10-CM

## 2020-11-28 DIAGNOSIS — E041 Nontoxic single thyroid nodule: Secondary | ICD-10-CM

## 2020-11-28 DIAGNOSIS — Z0000 Encounter for general adult medical examination without abnormal findings: Secondary | ICD-10-CM

## 2020-11-28 DIAGNOSIS — M48061 Spinal stenosis, lumbar region without neurogenic claudication: Secondary | ICD-10-CM

## 2020-11-28 DIAGNOSIS — J45909 Unspecified asthma, uncomplicated: Secondary | ICD-10-CM

## 2020-11-28 DIAGNOSIS — M1990 Unspecified osteoarthritis, unspecified site: Secondary | ICD-10-CM

## 2020-11-28 DIAGNOSIS — Z131 Encounter for screening for diabetes mellitus: Secondary | ICD-10-CM | POA: Diagnosis not present

## 2020-11-28 DIAGNOSIS — M5136 Other intervertebral disc degeneration, lumbar region: Secondary | ICD-10-CM

## 2020-11-28 DIAGNOSIS — I700 Atherosclerosis of aorta: Secondary | ICD-10-CM

## 2020-11-28 DIAGNOSIS — G479 Sleep disorder, unspecified: Secondary | ICD-10-CM

## 2020-11-28 DIAGNOSIS — K219 Gastro-esophageal reflux disease without esophagitis: Secondary | ICD-10-CM

## 2020-11-28 DIAGNOSIS — Z1159 Encounter for screening for other viral diseases: Secondary | ICD-10-CM | POA: Diagnosis not present

## 2020-11-28 DIAGNOSIS — K588 Other irritable bowel syndrome: Secondary | ICD-10-CM | POA: Diagnosis not present

## 2020-11-28 DIAGNOSIS — I10 Essential (primary) hypertension: Secondary | ICD-10-CM

## 2020-11-28 MED ORDER — TIZANIDINE HCL 4 MG PO TABS
4.0000 mg | ORAL_TABLET | Freq: Four times a day (QID) | ORAL | 5 refills | Status: DC | PRN
Start: 2020-11-28 — End: 2021-05-11

## 2020-11-28 MED ORDER — PANTOPRAZOLE SODIUM 40 MG PO TBEC: 40.0000 mg | DELAYED_RELEASE_TABLET | Freq: Every day | ORAL | 1 refills | Status: DC

## 2020-11-28 MED ORDER — ATENOLOL 25 MG PO TABS: 12.5000 mg | ORAL_TABLET | Freq: Every day | ORAL | 1 refills | Status: DC

## 2020-11-28 MED ORDER — ONDANSETRON 8 MG PO TBDP
8.0000 mg | ORAL_TABLET | Freq: Three times a day (TID) | ORAL | 1 refills | Status: DC | PRN
Start: 2020-11-28 — End: 2022-06-06

## 2020-11-28 MED ORDER — ZOLPIDEM TARTRATE 5 MG PO TABS: 5.0000 mg | ORAL_TABLET | Freq: Every day | ORAL | 1 refills | Status: DC

## 2020-11-28 MED ORDER — DICLOFENAC SODIUM 75 MG PO TBEC: 75.0000 mg | DELAYED_RELEASE_TABLET | Freq: Two times a day (BID) | ORAL | 2 refills | Status: DC

## 2020-11-28 MED ORDER — HYOSCYAMINE SULFATE 0.125 MG PO TABS
0.1250 mg | ORAL_TABLET | Freq: Four times a day (QID) | ORAL | 1 refills | Status: DC | PRN
Start: 2020-11-28 — End: 2021-05-11

## 2020-11-28 MED ORDER — FAMOTIDINE 20 MG PO TABS: 20.0000 mg | ORAL_TABLET | Freq: Every day | ORAL | 1 refills | Status: DC

## 2020-11-28 MED ORDER — SYMBICORT 80-4.5 MCG/ACT IN AERO: INHALATION_SPRAY | RESPIRATORY_TRACT | 11 refills | Status: DC

## 2020-11-28 MED ORDER — MONTELUKAST SODIUM 10 MG PO TABS: ORAL_TABLET | ORAL | 1 refills | Status: DC

## 2020-11-28 MED ORDER — HYDROCHLOROTHIAZIDE 12.5 MG PO CAPS: 12.5000 mg | ORAL_CAPSULE | Freq: Every day | ORAL | 1 refills | Status: DC | PRN

## 2020-11-28 NOTE — Patient Instructions (Signed)
Health Maintenance After Age 70 After age 70, you are at a higher risk for certain long-term diseases and infections as well as injuries from falls. Falls are a major cause of broken bones and head injuries in people who are older than age 70. Getting regular preventive care can help to keep you healthy and well. Preventive care includes getting regular testing and making lifestyle changes as recommended by your health care provider. Talk with your health care provider about:  Which screenings and tests you should have. A screening is a test that checks for a disease when you have no symptoms.  A diet and exercise plan that is right for you. What should I know about screenings and tests to prevent falls? Screening and testing are the best ways to find a health problem early. Early diagnosis and treatment give you the best chance of managing medical conditions that are common after age 70. Certain conditions and lifestyle choices may make you more likely to have a fall. Your health care provider may recommend:  Regular vision checks. Poor vision and conditions such as cataracts can make you more likely to have a fall. If you wear glasses, make sure to get your prescription updated if your vision changes.  Medicine review. Work with your health care provider to regularly review all of the medicines you are taking, including over-the-counter medicines. Ask your health care provider about any side effects that may make you more likely to have a fall. Tell your health care provider if any medicines that you take make you feel dizzy or sleepy.  Osteoporosis screening. Osteoporosis is a condition that causes the bones to get weaker. This can make the bones weak and cause them to break more easily.  Blood pressure screening. Blood pressure changes and medicines to control blood pressure can make you feel dizzy.  Strength and balance checks. Your health care provider may recommend certain tests to check your  strength and balance while standing, walking, or changing positions.  Foot health exam. Foot pain and numbness, as well as not wearing proper footwear, can make you more likely to have a fall.  Depression screening. You may be more likely to have a fall if you have a fear of falling, feel emotionally low, or feel unable to do activities that you used to do.  Alcohol use screening. Using too much alcohol can affect your balance and may make you more likely to have a fall. What actions can I take to lower my risk of falls? General instructions  Talk with your health care provider about your risks for falling. Tell your health care provider if: ? You fall. Be sure to tell your health care provider about all falls, even ones that seem minor. ? You feel dizzy, sleepy, or off-balance.  Take over-the-counter and prescription medicines only as told by your health care provider. These include any supplements.  Eat a healthy diet and maintain a healthy weight. A healthy diet includes low-fat dairy products, low-fat (lean) meats, and fiber from whole grains, beans, and lots of fruits and vegetables. Home safety  Remove any tripping hazards, such as rugs, cords, and clutter.  Install safety equipment such as grab bars in bathrooms and safety rails on stairs.  Keep rooms and walkways well-lit. Activity  Follow a regular exercise program to stay fit. This will help you maintain your balance. Ask your health care provider what types of exercise are appropriate for you.  If you need a cane or walker,   use it as recommended by your health care provider.  Wear supportive shoes that have nonskid soles.   Lifestyle  Do not drink alcohol if your health care provider tells you not to drink.  If you drink alcohol, limit how much you have: ? 0-1 drink a day for women. ? 0-2 drinks a day for men.  Be aware of how much alcohol is in your drink. In the [place] one drink equals one typical bottle of beer (12  oz), one-half glass of wine (5 oz), or one shot of hard liquor (1 oz).  Do not use any products that contain nicotine or tobacco, such as cigarettes and e-cigarettes. If you need help quitting, ask your health care provider. Summary  Having a healthy lifestyle and getting preventive care can help to protect your health and wellness after age 70.  Screening and testing are the best way to find a health problem early and help you avoid having a fall. Early diagnosis and treatment give you the best chance for managing medical conditions that are more common for people who are older than age 70.  Falls are a major cause of broken bones and head injuries in people who are older than age 70. Take precautions to prevent a fall at home.  Work with your health care provider to learn what changes you can make to improve your health and wellness and to prevent falls. This information is not intended to replace advice given to you by your health care provider. Make sure you discuss any questions you have with your health care provider. Document Revised: 01/21/2019 Document Reviewed: 08/13/2017 Elsevier Patient Education  2021 [employer]  

## 2020-11-28 NOTE — Progress Notes (Signed)
This visit occurred during the SARS-CoV-2 public health emergency.  Safety protocols were in place, including screening questions prior to the visit, additional usage of staff PPE, and extensive cleaning of exam room while observing appropriate contact time as indicated for disinfecting solutions.    Patient ID: Rita Levine, female  DOB: 22-Dec-1950, 70 y.o.   MRN: [id number] Patient Care Team    Relationship Specialty Notifications Start End  Ma Hillock, DO PCP - General Family Medicine  12/01/19   Jettie Booze, MD PCP - Cardiology Cardiology  01/26/20   Milus Banister, MD Attending Physician Gastroenterology  12/01/19     Chief Complaint  Patient presents with  . Annual Exam    Pt is not fasting;     Subjective:  Rita Levine is a 70 y.o.  Female  present for CPE/cmc. All past medical history, surgical history, allergies, family history, immunizations, medications and social history were updated in the electronic medical record today. All recent labs, ED visits and hospitalizations within the last year were reviewed.  Health maintenance:  Colonoscopy: completed 07/2016, by jacobs, resutls 10 yr per pt.  Mammogram: completed:09/2020, [facility]. Ordered Cervical cancer screening: > 65 n/a Immunizations: tdap UTD 2015, Influenza UTD 2021 (encouraged yearly), PNA series completed, zostavax/shingrix declined , covid declined Infectious disease screening:  Hep C agreeable to testing.  DEXA: last completed 2018, osteopenia> ordered Assistive device: none Oxygen QVZ:DGLO Patient has a Dental home. Hospitalizations/ED visits: reviewed  Gastroesophageal reflux disease, unspecified whether esophagitis present Patient reports symptoms are controlled on omeprazole and Pepcid.  Essential hypertension/HLD/Statin declined/Palpitations/Obesity (BMI 30-39.9) Pt reports compliance with atenolol and Microzide-although she admits she forgot to take them this morning. Blood  pressures ranges at home within normal limits. Patient denies chest pain, shortness of breath or lower extremity edema. Pt does not daily baby ASA. Pt is not prescribed statin. Labs up-to-date 07/2019 at prior PCP  Fibromyalgia/osteoarthritis, unspecified osteoarthritis type, unspecified site/ DDD (degenerative disc disease), lumbar/Spinal stenosis of lumbar region, unspecified whether neurogenic claudication present Patient reports her fibromyalgia and arthritis symptoms are well controlled on Mobic and Zanaflex.  Seasonal allergies/Allergic rhinitis due to pollen, unspecified seasonality/ Multiple food allergies Patient reports her allergies have always been rather uncontrolled.  When she lived in a different state she had food allergy testing and was allergic to many foods.  She at one time had allergy shots and this is when her allergies were the best as far as control.  She reports frequent occurrence of sinus infections and sinus headaches.  She had an MRI in 2013.  Her current regimen consist of Xyzal, Allegra, Singulair and Flonase.  She has taking Zyrtec in the past.  She reports the Xyzal was added last year but she does not feel its been helpful.  irritable bowel syndrome She reports an extensive history of diverticulitis requiring colon resection and later exploratory lap of the abdomen with removal of adhesions for short bowel obstruction.  SHe has continued irritable bowel symptoms that are controlled on Levsin.  She is established with gastroenterology.  Last colonoscopy 2017, with Dr. Ardis Hughs  Asthma, intrinsic Patient reports her asthma is rather controlled on Symbicort.  She rarely needs to use her albuterol inhaler.  Hypothyroidism, unspecified type/Thyroid nodule Thyroid nodule history.  Last ultrasound 08/17/2018 with left thyroid nodule.  Per radiology report 1 year follow-up was recommended.  Patient does endorse mild compression-like symptoms.  She states her prior PCP had  ordered follow-up, but it was  canceled secondary to Covid pandemic.  Patient had thyroid ultrasound completed at [facility] radiology in [place].  Depression screen [facility] 2/9 11/28/2020 07/18/2020 12/03/2019  Decreased Interest 0 0 0  Down, Depressed, Hopeless 0 0 0  PHQ - 2 Score 0 0 0   No flowsheet data found.   Immunization History  Administered Date(s) Administered  . Influenza-Unspecified 06/11/2019, 07/14/2020  . Pneumococcal Conjugate-13 07/03/2016  . Pneumococcal Polysaccharide-23 08/03/2019  . Tdap 05/02/2014  . Zoster 10/14/2010    Past Medical History:  Diagnosis Date  . Allergic rhinitis   . Allergic rhinitis due to pollen 12/03/2019  . Anemia   . Asthma   . Chest pain, unspecified 04/07/2014  . Chicken pox   . Colon polyp   . Diverticulosis   . Dysphagia   . Dysrhythmia    Palpitations  . Fibromyalgia   . Food allergy   . Gallstone   . GERD (gastroesophageal reflux disease)   . Hiatal hernia   . Hypercholesteremia   . Hypertension   . Hypothyroidism   . IBS (irritable bowel syndrome)   . Migraine headache    occasionally  . Osteoarthritis   . Osteopenia   . Pneumonia   . PONV (postoperative nausea and vomiting)   . Pulmonary hypertension ([place])    pt denies, pt states she was tested for it but was not diagnosed with it  . Rectal bleeding   . SBO (small bowel obstruction) ([place]) 10/20/2017  . Seasonal allergies   . Thyroid nodule    Allergies  Allergen Reactions  . Sulfamethoxazole-Trimethoprim Hives  . Avelox [Moxifloxacin Hcl In Nacl]     Racing heart  . Cephalexin Hives  . Codeine Hives    Heart racing  . Erythromycin Hives  . Flagyl [Metronidazole]     Unknown reaction  . Propranolol Hives  . Sulfa Antibiotics Hives  . Tetanus Toxoid Swelling    Reports a fever and headache with swelling.   . Tramadol Hives  . Statins Other (See Comments)    Myalgia    Past Surgical History:  Procedure Laterality Date  . APPENDECTOMY  1982  . BREAST  EXCISIONAL BIOPSY Right 1995   benign  . BREAST LUMPECTOMY  1991  . CARPAL TUNNEL RELEASE Right 2006   Right wrist  . CESAREAN SECTION     x 2  . CHOLECYSTECTOMY N/A 07/25/2020   Procedure: LAPAROSCOPIC CHOLECYSTECTOMY;  Surgeon: [name], MD;  Location: [facility];  Service: General;  Laterality: N/A;  . COLON RESECTION  1990  . COLONOSCOPY    . CT ABDOMEN PELVIS W ([place] HX)  01/03/2020  . IMAGE MRI brain:  04/2012   No focal IAC or inner ear lesion to explain hearing loss. Slightly greater than expected number of subcortical T2- hyperintensities bilaterally.  These are nonspecific.  They can be seen in the setting of chronic migraine headaches, demyelinating process, chronic microvascular ischemic, prior infection or inflammation, or vasculitis  . IMAGE MRI lumbar:  05/01/2006   Mild central canal stenosis with facet arthropathy and mildly bulging disc at L4-5.  There is mild narrowing in the left lateral recess at this level.  No definite neural impingement.  Appearance at this level has not markedly Moderately severe facet arthropathy at L5-S1 with mild interval progression of a diffuse broad based disc bulge.  There is mild biforaminal narrowing.  Central canal is open  . LAPAROSCOPIC ABDOMINAL EXPLORATION  2019   adhesion removal> caused bowel blockage   . NASAL  SEPTUM SURGERY  2004  . TONSILLECTOMY  1965  . TOTAL ABDOMINAL HYSTERECTOMY W/ BILATERAL SALPINGOOPHORECTOMY  1988  . TUBAL LIGATION  1974  . UPPER GASTROINTESTINAL ENDOSCOPY  2020   Family History  Problem Relation Age of Onset  . Hyperlipidemia Father   . Hypertension Father   . Arthritis Father   . Diabetes Father   . Asthma Father   . COPD Father   . Early death Father   . Parkinson's disease Father   . Hypertension Mother   . Hyperlipidemia Mother   . Macular degeneration Mother   . Arthritis Mother   . Hearing loss Mother   . Heart disease Mother   . Kidney disease Mother   . Throat cancer Brother         lung, and tongue  . Arthritis Brother   . COPD Brother   . Diabetes type II Sister   . COPD Sister   . Heart disease Sister   . Hypertension Sister   . Lung cancer Sister   . Early death Sister   . COPD Sister   . Breast cancer Maternal Aunt   . Lung cancer Other        uncle  . Emphysema Maternal Aunt   . Emphysema Maternal Uncle   . Clotting disorder Sister   . Breast cancer Cousin   . Cancer Sister   . Alcohol abuse Sister   . COPD Sister   . Early death Sister   . Alcohol abuse Brother   . Arthritis Brother   . Depression Brother   . Diabetes Brother   . Hypercholesterolemia Brother   . Colon cancer Neg Hx   . Esophageal cancer Neg Hx   . Rectal cancer Neg Hx   . Stomach cancer Neg Hx    Social History   Social History Narrative   Marital status/children/pets: married, 2 children   Education/employment: HS grad. retired   Engineer, materials:      -smoke alarm in the home:Yes     - wears seatbelt: Yes     - Feels safe in their relationships: Yes    Allergies as of 11/28/2020      Reactions   Sulfamethoxazole-trimethoprim Hives   Avelox [moxifloxacin Hcl In Nacl]    Racing heart   Cephalexin Hives   Codeine Hives   Heart racing   Erythromycin Hives   Flagyl [metronidazole]    Unknown reaction   Propranolol Hives   Sulfa Antibiotics Hives   Tetanus Toxoid Swelling   Reports a fever and headache with swelling.    Tramadol Hives   Statins Other (See Comments)   Myalgia      Medication List       Accurate as of November 28, 2020  1:59 PM. If you have any questions, ask your nurse or doctor.        STOP taking these medications   amoxicillin-clavulanate 875-125 MG tablet Commonly known as: AUGMENTIN Stopped by: Howard Pouch, DO   ibuprofen 800 MG tablet Commonly known as: ADVIL Stopped by: Howard Pouch, DO   predniSONE 20 MG tablet Commonly known as: DELTASONE Stopped by: Howard Pouch, DO     TAKE these medications   albuterol 108 (90 Base) MCG/ACT  inhaler Commonly known as: ProAir HFA Inhale 2 puffs into the lungs every 6 (six) hours as needed.   Allergy Relief 180 MG tablet Generic drug: fexofenadine TAKE ONE TABLET BY MOUTH EVERY DAY   Armour Thyroid 30 MG tablet Generic  drug: thyroid Take 1 tablet by mouth in the AM once a day   Armour Thyroid 15 MG tablet Generic drug: thyroid Take 1 tablet by mouth in the AM Once a day   atenolol 25 MG tablet Commonly known as: TENORMIN Take 0.5-1 tablets (12.5-25 mg total) by mouth daily. What changed: how much to take Changed by: Howard Pouch, DO   calcium carbonate 500 MG chewable tablet Commonly known as: TUMS - dosed in mg elemental calcium Chew 3 tablets by mouth daily as needed for indigestion or heartburn.   CoQ-10 100 MG Caps Take 100 mg by mouth daily.   diclofenac 75 MG EC tablet Commonly known as: VOLTAREN Take 1 tablet (75 mg total) by mouth 2 (two) times daily.   ezetimibe 10 MG tablet Commonly known as: ZETIA Take 1 tablet (10 mg total) by mouth daily.   famotidine 20 MG tablet Commonly known as: PEPCID Take 1 tablet (20 mg total) by mouth daily.   fluticasone 50 MCG/ACT nasal spray Commonly known as: FLONASE Place 2 sprays into both nostrils daily.   hydrochlorothiazide 12.5 MG capsule Commonly known as: MICROZIDE Take 1 capsule (12.5 mg total) by mouth daily as needed. Started by: Howard Pouch, DO   hyoscyamine 0.125 MG tablet Commonly known as: LEVSIN Take 1 tablet (0.125 mg total) by mouth every 6 (six) hours as needed.   levocetirizine 5 MG tablet Commonly known as: XYZAL Take 5 mg by mouth every evening.   losartan 25 MG tablet Commonly known as: COZAAR Take 0.5-1 tablets (12.5-25 mg total) by mouth daily. What changed: additional instructions   mometasone 0.1 % lotion Commonly known as: ELOCON Apply 1 application topically daily as needed (ear irritation).   montelukast 10 MG tablet Commonly known as: SINGULAIR TAKE 1 TABLET BY  MOUTH EVERYDAY AT BEDTIME   mupirocin ointment 2 % Commonly known as: BACTROBAN Apply 1 application topically 2 (two) times daily.   ondansetron 8 MG disintegrating tablet Commonly known as: ZOFRAN-ODT Take 1 tablet (8 mg total) by mouth every 8 (eight) hours as needed for nausea or vomiting.   pantoprazole 40 MG tablet Commonly known as: PROTONIX Take 1 tablet (40 mg total) by mouth daily.   Probiotic Caps Take 1 capsule by mouth daily.   rosuvastatin 10 MG tablet Commonly known as: CRESTOR TAKE 1 TABLET ONCE A WEEK FOR 1 WEEK, THEN INCREASE TO TAKING 1 TABLET TWICE A WEEK   Symbicort 80-4.5 MCG/ACT inhaler Generic drug: budesonide-formoterol INHALE TWO PUFFS INTO THE LUNGS IN THE MORNING AND AT BEDTIME   THERATEARS OP Place 1 drop into both eyes 2 (two) times daily.   tiZANidine 4 MG tablet Commonly known as: ZANAFLEX Take 1 tablet (4 mg total) by mouth every 6 (six) hours as needed for muscle spasms.   VITAMIN A PO Take 1 tablet by mouth daily.   vitamin C 1000 MG tablet Take 3,000 mg by mouth daily.   Vitamin D 125 MCG (5000 UT) Caps Take 5,000 Units by mouth daily.   vitamin E 1000 UNIT capsule Take 1,000 Units by mouth daily.   zinc gluconate 50 MG tablet Take 100 mg by mouth daily.   zolpidem 5 MG tablet Commonly known as: AMBIEN Take 1 tablet (5 mg total) by mouth at bedtime. As needed       All past medical history, surgical history, allergies, family history, immunizations andmedications were updated in the EMR today and reviewed under the history and medication portions of their EMR.  MM 3D SCREEN BREAST BILATERAL Result Date: 09/29/2020  BI-RADS CATEGORY  1: Negative. Electronically Signed   By: Lajean Manes M.D.   On: 09/29/2020 11:27   LONG TERM MONITOR (3-14 DAYS)  Result Date: 09/29/2020  Normal sinus rhythm.  Rare, briuef runs of PACs.  No sustained pathologic arrhythmias.      ROS: 14 pt review of systems performed and  negative (unless mentioned in an HPI)  Objective: BP 126/74   Pulse 93   Temp 99 F (37.2 C) (Oral)   Ht 4' 10.25" (1.48 m)   Wt 139 lb (63 kg)   SpO2 96%   BMI 28.80 kg/m  Gen: Afebrile. No acute distress. Nontoxic in appearance, well-developed, well-nourished,  Very pleasant female. overweight HENT: AT. [place]. Bilateral TM visualized and normal in appearance, normal external auditory canal. MMM, no oral lesions, adequate dentition. Bilateral nares within normal limits. Throat without erythema, ulcerations or exudates. no Cough on exam, no hoarseness on exam. Eyes:Pupils Equal Round Reactive to light, Extraocular movements intact,  Conjunctiva without redness, discharge or icterus. Neck/lymp/endocrine: Supple,no lymphadenopathy, ? mld right thyroid fullness.   CV: RRR no murmur, no edema, +2/4 P posterior tibialis pulses.  Chest: CTAB, no wheeze, rhonchi or crackles. normal Respiratory effort. good Air movement. Abd: Soft. flat. NTND. BS present. no Masses palpated. No hepatosplenomegaly. No rebound tenderness or guarding. Skin: no rashes, purpura or petechiae. Warm and well-perfused. Skin intact. Neuro/Msk:  Normal gait. PERLA. EOMi. Alert. Oriented x3.  Cranial nerves II through XII intact. Muscle strength 5/5 upper/lower extremity. DTRs equal bilaterally. Psych: Normal affect, dress and demeanor. Normal speech. Normal thought content and judgment.  No exam data present  Assessment/plan: MONTGOMERY ROTHLISBERGER is a 70 y.o. female present for CPE/CMC Gastroesophageal reflux disease, unspecified whether esophagitis present stable Continue  Protonix. Continue  Pepcid.  Essential hypertension/HLD/Statin declined/Palpitations/Obesity (BMI 30-39.9) Stable.  Cbc, cmp, tsh collected today Continue Tenormin 12.5 mg- 25 mg daily continue  Microzide 12.5 mg daily PRN if notable fluid Rarely needs losartan 1/2 tab. Only if pressures are elevated.  Patient reports compliance with low dose statin  twice a week supplied by Dr. Scarlette Calico.  > she has follow up schedule with him Low-sodium diet.  Routine exercise.  Osteoarthritis, unspecified osteoarthritis type, unspecified site/ DDD (degenerative disc disease), lumbar/Spinal stenosis of lumbar region, unspecified whether neurogenic claudication present/Fibromyalgia Stable.  Continue voltaren BID  Continue  Zanaflex  Seasonal allergies/Allergic rhinitis due to pollen, unspecified seasonality/ Multiple food allergies Continue allergy med of choice.  Vistaril  Tired & not helpful.  Continue  Singulair 10 mg nightly Continue flonase  Sleep disturbance: Stable.  continue Ambien prn Tired vistaril- not helpful. Could consider trazodone in the future if willing.  nccs database reviewed today.   irritable bowel syndrome Stable.  Continue probiotic Continue  Levsin 0.125 mg every 6 hours as needed  Asthma, intrinsic Stable.  Continue  Symbicort. Continue  albuterol as needed Continue antihistamine regimen  Hypothyroidism, unspecified type/Thyroid nodule Tsh, t4 free collected today -Continue Armour Thyroid total dose 45 mg daily (30/15).  This is the only medicine called into [facility]. Will refill after lab results in appropriate dose.  - US THYROID; Future ordered today for 1 yr follow up Osteopenia, unspecified location - VITAMIN D 25 Hydroxy (Vit-D Deficiency, Fractures) - DG Bone Density; Future Diabetes mellitus screening - Hemoglobin A1c Need for hepatitis C screening test - Hepatitis C antibody Breast cancer screening by mammogram - MM 3D SCREEN BREAST  BILATERAL; Future  Preventive health exam: Patient was encouraged to exercise greater than 150 minutes a week. Patient was encouraged to choose a diet filled with fresh fruits and vegetables, and lean meats. AVS provided to patient today for education/recommendation on gender specific health and safety maintenance. Colonoscopy: completed  07/2016, by jacobs, resutls 10 yr per pt.  Mammogram: completed:09/2020, [facility]. Ordered Cervical cancer screening: > 65 n/a Immunizations: tdap UTD 2015, Influenza UTD 2021 (encouraged yearly), PNA series completed, zostavax/shingrix declined , covid declined Infectious disease screening:  Hep C agreeable to testing.  DEXA: last completed 2018, osteopenia> ordered  Return in about 5 months (around 05/14/2021). [place] and 1 yr cpe   Orders Placed This Encounter  Procedures  . DG Bone Density  . US THYROID  . MM 3D SCREEN BREAST BILATERAL  . CBC with Differential/Platelet  . Comprehensive metabolic panel  . Hemoglobin A1c  . Lipid panel  . Hepatitis C antibody  . VITAMIN D 25 Hydroxy (Vit-D Deficiency, Fractures)  . TSH  . T4, free   Meds ordered this encounter  Medications  . tiZANidine (ZANAFLEX) 4 MG tablet    Sig: Take 1 tablet (4 mg total) by mouth every 6 (six) hours as needed for muscle spasms.    Dispense:  120 tablet    Refill:  5  . SYMBICORT 80-4.5 MCG/ACT inhaler    Sig: INHALE TWO PUFFS INTO THE LUNGS IN THE MORNING AND AT BEDTIME    Dispense:  30.6 g    Refill:  11  . pantoprazole (PROTONIX) 40 MG tablet    Sig: Take 1 tablet (40 mg total) by mouth daily.    Dispense:  90 tablet    Refill:  1  . ondansetron (ZOFRAN-ODT) 8 MG disintegrating tablet    Sig: Take 1 tablet (8 mg total) by mouth every 8 (eight) hours as needed for nausea or vomiting.    Dispense:  30 tablet    Refill:  1  . montelukast (SINGULAIR) 10 MG tablet    Sig: TAKE 1 TABLET BY MOUTH EVERYDAY AT BEDTIME    Dispense:  90 tablet    Refill:  1  . hyoscyamine (LEVSIN) 0.125 MG tablet    Sig: Take 1 tablet (0.125 mg total) by mouth every 6 (six) hours as needed.    Dispense:  120 tablet    Refill:  1  . famotidine (PEPCID) 20 MG tablet    Sig: Take 1 tablet (20 mg total) by mouth daily.    Dispense:  90 tablet    Refill:  1  . diclofenac (VOLTAREN) 75 MG EC tablet    Sig: Take 1 tablet (75  mg total) by mouth 2 (two) times daily.    Dispense:  180 tablet    Refill:  2  . atenolol (TENORMIN) 25 MG tablet    Sig: Take 0.5-1 tablets (12.5-25 mg total) by mouth daily.    Dispense:  90 tablet    Refill:  1  . zolpidem (AMBIEN) 5 MG tablet    Sig: Take 1 tablet (5 mg total) by mouth at bedtime. As needed    Dispense:  90 tablet    Refill:  1  . hydrochlorothiazide (MICROZIDE) 12.5 MG capsule    Sig: Take 1 capsule (12.5 mg total) by mouth daily as needed.    Dispense:  45 capsule    Refill:  1   Referral Orders  No referral(s) requested today     Electronically signed by: Joseph Art  Raoul [facility] [place]

## 2020-11-29 ENCOUNTER — Telehealth: Payer: Self-pay | Admitting: Family Medicine

## 2020-11-29 DIAGNOSIS — M8580 Other specified disorders of bone density and structure, unspecified site: Secondary | ICD-10-CM

## 2020-11-29 NOTE — Telephone Encounter (Signed)
Location changed 

## 2020-11-29 NOTE — Telephone Encounter (Signed)
Patient would like her bone density performed at [facility] because that is where her last one was done. Order is in chart but with preferred location as [facility]. Please change routing to [facility] in [place].

## 2020-11-30 ENCOUNTER — Telehealth: Payer: Self-pay | Admitting: Family Medicine

## 2020-11-30 LAB — CBC WITH DIFFERENTIAL/PLATELET
Absolute Monocytes: 756 cells/uL (ref 200–950)
Basophils Absolute: 72 cells/uL (ref 0–200)
Basophils Relative: 1 %
Eosinophils Absolute: 158 cells/uL (ref 15–500)
Eosinophils Relative: 2.2 %
HCT: 40.7 % (ref 35.0–45.0)
Hemoglobin: 13.7 g/dL (ref 11.7–15.5)
Lymphs Abs: 1699 cells/uL (ref 850–3900)
MCH: 27.6 pg (ref 27.0–33.0)
MCHC: 33.7 g/dL (ref 32.0–36.0)
MCV: 82.1 fL (ref 80.0–100.0)
MPV: 10.8 fL (ref 7.5–12.5)
Monocytes Relative: 10.5 %
Neutro Abs: 4514 cells/uL (ref 1500–7800)
Neutrophils Relative %: 62.7 %
Platelets: 333 10*3/uL (ref 140–400)
RBC: 4.96 10*6/uL (ref 3.80–5.10)
RDW: 13.9 % (ref 11.0–15.0)
Total Lymphocyte: 23.6 %
WBC: 7.2 10*3/uL (ref 3.8–10.8)

## 2020-11-30 LAB — COMPREHENSIVE METABOLIC PANEL
AG Ratio: 1.7 (calc) (ref 1.0–2.5)
ALT: 17 U/L (ref 6–29)
AST: 19 U/L (ref 10–35)
Albumin: 4.3 g/dL (ref 3.6–5.1)
Alkaline phosphatase (APISO): 104 U/L (ref 37–153)
BUN: 12 mg/dL (ref 7–25)
CO2: 24 mmol/L (ref 20–32)
Calcium: 9.7 mg/dL (ref 8.6–10.4)
Chloride: 103 mmol/L (ref 98–110)
Creat: 0.73 mg/dL (ref 0.50–0.99)
Globulin: 2.5 g/dL (calc) (ref 1.9–3.7)
Glucose, Bld: 124 mg/dL — ABNORMAL HIGH (ref 65–99)
Potassium: 4.3 mmol/L (ref 3.5–5.3)
Sodium: 140 mmol/L (ref 135–146)
Total Bilirubin: 0.2 mg/dL (ref 0.2–1.2)
Total Protein: 6.8 g/dL (ref 6.1–8.1)

## 2020-11-30 LAB — TEST AUTHORIZATION

## 2020-11-30 LAB — HEPATITIS C ANTIBODY
Hepatitis C Ab: NONREACTIVE
SIGNAL TO CUT-OFF: 0 (ref <1.00)

## 2020-11-30 LAB — TSH: TSH: 1.05 mIU/L (ref 0.40–4.50)

## 2020-11-30 LAB — LIPID PANEL
Cholesterol: 182 mg/dL (ref <200)
HDL: 42 mg/dL — ABNORMAL LOW (ref >=50)
LDL Cholesterol (Calc): 90 mg/dL (calc)
Non-HDL Cholesterol (Calc): 140 mg/dL (calc) — ABNORMAL HIGH (ref <130)
Total CHOL/HDL Ratio: 4.3 (calc) (ref <5.0)
Triglycerides: 375 mg/dL — ABNORMAL HIGH (ref <150)

## 2020-11-30 LAB — HEMOGLOBIN A1C
Hgb A1c MFr Bld: 5.8 % of total Hgb — ABNORMAL HIGH (ref <5.7)
Mean Plasma Glucose: 120 mg/dL
eAG (mmol/L): 6.6 mmol/L

## 2020-11-30 LAB — T4, FREE: Free T4: 0.9 ng/dL (ref 0.8–1.8)

## 2020-11-30 LAB — VITAMIN D 25 HYDROXY (VIT D DEFICIENCY, FRACTURES): Vit D, 25-Hydroxy: 72 ng/mL (ref 30–100)

## 2020-11-30 MED ORDER — ARMOUR THYROID 15 MG PO TABS: ORAL_TABLET | ORAL | 3 refills | Status: DC

## 2020-11-30 MED ORDER — ARMOUR THYROID 30 MG PO TABS: ORAL_TABLET | ORAL | 3 refills | Status: DC

## 2020-11-30 NOTE — Telephone Encounter (Signed)
Please call patient Liver, kidney and thyroid function are normal> I have refilled her medicine and sent to her [facility]. Blood cell counts and electrolytes are normal Vitamin D levels look excellent on current dose-continue Diabetes screening/A1c is normal at 5.8 Hepatitis C screening is negative Cholesterol panel looks good overall.  Her triglycerides do look elevated however she was not fasting so this is expected to be at least mildly above goal.

## 2020-11-30 NOTE — Telephone Encounter (Signed)
Patient advised and voiced understanding.  

## 2020-12-05 ENCOUNTER — Telehealth: Payer: Self-pay | Admitting: Family Medicine

## 2020-12-05 NOTE — Telephone Encounter (Signed)
Patient requesting refill of levocetirizine. She states pharmacy told her refill request was denied, however she had CPE appt 11/28/20. Please send to same [facility] in [place].

## 2020-12-06 ENCOUNTER — Telehealth: Payer: Self-pay | Admitting: Family Medicine

## 2020-12-06 MED ORDER — LEVOCETIRIZINE DIHYDROCHLORIDE 5 MG PO TABS: 5.0000 mg | ORAL_TABLET | Freq: Every evening | ORAL | 3 refills | Status: DC

## 2020-12-06 NOTE — Telephone Encounter (Signed)
Has not been prescribed by PCP. Please advise if okay

## 2020-12-06 NOTE — Telephone Encounter (Signed)
Left message for patient to schedule Annual Wellness Visit.  Please schedule with Nurse Health Advisor Martha Stanley, RN at [facility]  °

## 2020-12-06 NOTE — Telephone Encounter (Signed)
Please explained to patient we have not filled this medicine for her in the past.  Therefore basically feels a probably sent to her prior provider.  This is an over-the-counter medication now.  I have filled it for her today,  in the event that insurance does help pay for medication.

## 2020-12-07 NOTE — Telephone Encounter (Signed)
Spoke with pt regarding rx request with providers instructions. Pt verbalized understanding

## 2020-12-13 ENCOUNTER — Telehealth: Payer: Self-pay

## 2020-12-13 DIAGNOSIS — M8580 Other specified disorders of bone density and structure, unspecified site: Secondary | ICD-10-CM

## 2020-12-13 NOTE — Telephone Encounter (Signed)
Received fax stating that pt's armour thyro tab 15 mg is no longer covered by insurance.  Alternative is levothyroxine tab or synthroid tab

## 2020-12-13 NOTE — Telephone Encounter (Signed)
Please call pt- she was getting [facility] at [facility].  If it her telling us she is not getting med bc of not being covered> then please inform her we would have to call in levothyroxine replacement (and what pharmacy).Marland Kitchen

## 2020-12-13 NOTE — Telephone Encounter (Signed)
LVM for pt to CB regarding rx 

## 2020-12-14 NOTE — Telephone Encounter (Signed)
Pt request change in location for Dexa

## 2020-12-14 NOTE — Telephone Encounter (Signed)
Spoke with pt who stated that insurance is not covered for thyroid and dicyclomine.

## 2020-12-14 NOTE — Addendum Note (Signed)
Addended by: Kavin Leech on: 12/14/2020 08:59 AM   Modules accepted: Orders

## 2020-12-17 ENCOUNTER — Other Ambulatory Visit: Payer: Self-pay | Admitting: Family Medicine

## 2020-12-18 MED ORDER — CANDESARTAN CILEXETIL 4 MG PO TABS: 4.0000 mg | ORAL_TABLET | Freq: Every day | ORAL | 1 refills | Status: DC

## 2020-12-18 NOTE — Telephone Encounter (Signed)
Please advise on ALT rx

## 2020-12-22 ENCOUNTER — Ambulatory Visit (INDEPENDENT_AMBULATORY_CARE_PROVIDER_SITE_OTHER): Payer: Medicare HMO

## 2020-12-22 ENCOUNTER — Encounter: Payer: Self-pay | Admitting: Family Medicine

## 2020-12-22 ENCOUNTER — Other Ambulatory Visit: Payer: Self-pay

## 2020-12-22 DIAGNOSIS — E041 Nontoxic single thyroid nodule: Secondary | ICD-10-CM

## 2020-12-22 NOTE — Telephone Encounter (Signed)
Please advise, pt last seen 11/28/20

## 2020-12-25 ENCOUNTER — Telehealth: Payer: Self-pay | Admitting: Family Medicine

## 2020-12-25 NOTE — Telephone Encounter (Signed)
LVM for pt to CB regarding results.  

## 2020-12-25 NOTE — Telephone Encounter (Signed)
Spoke with pt regarding labs and instructions.   

## 2020-12-25 NOTE — Telephone Encounter (Signed)
Please inform patient the following information: Good news, her thyroid nodule is stable and does not need ant future imaging.   She had also messaged about wanting a chest xray and trigger finger injection. Please advise her to schedule an appt to address these concerns.

## 2020-12-29 ENCOUNTER — Other Ambulatory Visit: Payer: Self-pay

## 2020-12-29 ENCOUNTER — Ambulatory Visit (INDEPENDENT_AMBULATORY_CARE_PROVIDER_SITE_OTHER): Payer: Medicare HMO | Admitting: Family Medicine

## 2020-12-29 ENCOUNTER — Encounter: Payer: Self-pay | Admitting: Family Medicine

## 2020-12-29 VITALS — BP 117/58 | HR 59 | Temp 98.7°F | Ht 58.25 in | Wt 140.0 lb

## 2020-12-29 DIAGNOSIS — Z8701 Personal history of pneumonia (recurrent): Secondary | ICD-10-CM

## 2020-12-29 DIAGNOSIS — R053 Chronic cough: Secondary | ICD-10-CM | POA: Diagnosis not present

## 2020-12-29 DIAGNOSIS — M65341 Trigger finger, right ring finger: Secondary | ICD-10-CM

## 2020-12-29 DIAGNOSIS — R5383 Other fatigue: Secondary | ICD-10-CM | POA: Diagnosis not present

## 2020-12-29 DIAGNOSIS — R059 Cough, unspecified: Secondary | ICD-10-CM

## 2020-12-29 DIAGNOSIS — Z801 Family history of malignant neoplasm of trachea, bronchus and lung: Secondary | ICD-10-CM

## 2020-12-29 NOTE — Progress Notes (Signed)
This visit occurred during the SARS-CoV-2 public health emergency.  Safety protocols were in place, including screening questions prior to the visit, additional usage of staff PPE, and extensive cleaning of exam room while observing appropriate contact time as indicated for disinfecting solutions.    Rita Levine , 01/25/1951, 70 y.o., female MRN: [id number] Patient Care Team    Relationship Specialty Notifications Start End  Ma Hillock, DO PCP - General Family Medicine  12/01/19   Jettie Booze, MD PCP - Cardiology Cardiology  01/26/20   Milus Banister, MD Attending Physician Gastroenterology  12/01/19     Chief Complaint  Patient presents with  . Cough    Pt c/o productive cough with grey, yellow, or grey-ish tint worsening in the a.m. x 2 yrs since dx with PNA  . trigger finger    Pt has been seen in past for trigger by ortho; pt c/o R hand ring finger stiffness and pain x 1 year;      Subjective: Pt presents for an OV with 2 complaints   Trigger finger: Patient reports she has had a trigger finger of her right ring finger for little over a year.  It is starting to worsen and trigger more frequently causing her discomfort and pain.  She had a trigger finger injection of her middle finger a few years ago, and it worked well for her.  She is interested in trigger finger injection for her ring finger.  Chronic cough: Patient reports she has a chronic productive cough with gray/yellow sputum production worse in the morning for the last 2 years.  She reports she had an episode of pneumonia at that time, and since then she has struggled with a chronic productive cough that she cannot seem to get rid of.  She has a history of asthma and reflux.  She is compliant with her Protonix/Pepcid, Symbicort, Singulair and Xyzal.  She has never smoked.  She has a family history of lung cancer, throat cancer and thyroid cancer in multiple siblings. She is not prescribed an ACE/ARB.Marland Kitchen She is  prescribed very low-dose atenolol for many years.  Depression screen [facility] 2/9 11/28/2020 07/18/2020 12/03/2019  Decreased Interest 0 0 0  Down, Depressed, Hopeless 0 0 0  PHQ - 2 Score 0 0 0    Allergies  Allergen Reactions  . Sulfamethoxazole-Trimethoprim Hives  . Avelox [Moxifloxacin Hcl In Nacl]     Racing heart  . Cephalexin Hives  . Codeine Hives    Heart racing  . Erythromycin Hives  . Flagyl [Metronidazole]     Unknown reaction  . Propranolol Hives  . Sulfa Antibiotics Hives  . Tetanus Toxoid Swelling    Reports a fever and headache with swelling.   . Tramadol Hives  . Statins Other (See Comments)    Myalgia    Social History   Social History Narrative   Marital status/children/pets: married, 2 children   Education/employment: HS grad. retired   Engineer, materials:      -smoke alarm in the home:Yes     - wears seatbelt: Yes     - Feels safe in their relationships: Yes   Past Medical History:  Diagnosis Date  . Allergic rhinitis   . Allergic rhinitis due to pollen 12/03/2019  . Anemia   . Asthma   . Chest pain, unspecified 04/07/2014  . Chicken pox   . Colon polyp   . Diverticulosis   . Dysphagia   . Dysrhythmia  Palpitations  . Fibromyalgia   . Food allergy   . Gallstone   . GERD (gastroesophageal reflux disease)   . Hiatal hernia   . Hypercholesteremia   . Hypertension   . Hypothyroidism   . IBS (irritable bowel syndrome)   . Migraine headache    occasionally  . Osteoarthritis   . Osteopenia   . Pneumonia   . PONV (postoperative nausea and vomiting)   . Pulmonary hypertension ([place])    pt denies, pt states she was tested for it but was not diagnosed with it  . Rectal bleeding   . SBO (small bowel obstruction) ([place]) 10/20/2017  . Seasonal allergies   . Thyroid nodule    Past Surgical History:  Procedure Laterality Date  . APPENDECTOMY  1982  . BREAST EXCISIONAL BIOPSY Right 1995   benign  . BREAST LUMPECTOMY  1991  . CARPAL TUNNEL RELEASE Right 2006    Right wrist  . CESAREAN SECTION     x 2  . CHOLECYSTECTOMY N/A 07/25/2020   Procedure: LAPAROSCOPIC CHOLECYSTECTOMY;  Surgeon: [name], MD;  Location: [facility];  Service: General;  Laterality: N/A;  . COLON RESECTION  1990  . COLONOSCOPY    . CT ABDOMEN PELVIS W ([place] HX)  01/03/2020  . IMAGE MRI brain:  04/2012   No focal IAC or inner ear lesion to explain hearing loss. Slightly greater than expected number of subcortical T2- hyperintensities bilaterally.  These are nonspecific.  They can be seen in the setting of chronic migraine headaches, demyelinating process, chronic microvascular ischemic, prior infection or inflammation, or vasculitis  . IMAGE MRI lumbar:  05/01/2006   Mild central canal stenosis with facet arthropathy and mildly bulging disc at L4-5.  There is mild narrowing in the left lateral recess at this level.  No definite neural impingement.  Appearance at this level has not markedly Moderately severe facet arthropathy at L5-S1 with mild interval progression of a diffuse broad based disc bulge.  There is mild biforaminal narrowing.  Central canal is open  . LAPAROSCOPIC ABDOMINAL EXPLORATION  2019   adhesion removal> caused bowel blockage   . NASAL SEPTUM SURGERY  2004  . TONSILLECTOMY  1965  . TOTAL ABDOMINAL HYSTERECTOMY W/ BILATERAL SALPINGOOPHORECTOMY  1988  . TUBAL LIGATION  1974  . UPPER GASTROINTESTINAL ENDOSCOPY  2020   Family History  Problem Relation Age of Onset  . Hyperlipidemia Father   . Hypertension Father   . Arthritis Father   . Diabetes Father   . Asthma Father   . COPD Father   . Early death Father   . Parkinson's disease Father   . Hypertension Mother   . Hyperlipidemia Mother   . Macular degeneration Mother   . Arthritis Mother   . Hearing loss Mother   . Heart disease Mother   . Kidney disease Mother   . Throat cancer Brother        lung, and tongue  . Arthritis Brother   . COPD Brother   . Diabetes type II Sister   . COPD Sister    . Heart disease Sister   . Hypertension Sister   . Thyroid cancer Sister   . Lung cancer Sister   . Early death Sister   . COPD Sister   . Breast cancer Maternal Aunt   . Lung cancer Other        uncle  . Emphysema Maternal Aunt   . Emphysema Maternal Uncle   . Clotting disorder Sister   .  Brain cancer Sister        brain tumor- not malignant  . Breast cancer Cousin   . Cancer Sister   . Alcohol abuse Sister   . COPD Sister   . Early death Sister   . Alcohol abuse Brother   . Arthritis Brother   . Depression Brother   . Diabetes Brother   . Hypercholesterolemia Brother   . Colon cancer Neg Hx   . Esophageal cancer Neg Hx   . Rectal cancer Neg Hx   . Stomach cancer Neg Hx    Allergies as of 12/29/2020      Reactions   Sulfamethoxazole-trimethoprim Hives   Avelox [moxifloxacin Hcl In Nacl]    Racing heart   Cephalexin Hives   Codeine Hives   Heart racing   Erythromycin Hives   Flagyl [metronidazole]    Unknown reaction   Propranolol Hives   Sulfa Antibiotics Hives   Tetanus Toxoid Swelling   Reports a fever and headache with swelling.    Tramadol Hives   Statins Other (See Comments)   Myalgia      Medication List       Accurate as of December 29, 2020 11:37 AM. If you have any questions, ask your nurse or doctor.        STOP taking these medications   candesartan 4 MG tablet Commonly known as: ATACAND Stopped by: Howard Pouch, DO     TAKE these medications   albuterol 108 (90 Base) MCG/ACT inhaler Commonly known as: ProAir HFA Inhale 2 puffs into the lungs every 6 (six) hours as needed.   Allergy Relief 180 MG tablet Generic drug: fexofenadine TAKE ONE TABLET BY MOUTH EVERY DAY   Armour Thyroid 30 MG tablet Generic drug: thyroid Take 1 tablet by mouth in the AM once a day   Armour Thyroid 15 MG tablet Generic drug: thyroid Take 1 tablet by mouth in the AM Once a day   atenolol 25 MG tablet Commonly known as: TENORMIN Take 0.5-1 tablets  (12.5-25 mg total) by mouth daily.   calcium carbonate 500 MG chewable tablet Commonly known as: TUMS - dosed in mg elemental calcium Chew 3 tablets by mouth daily as needed for indigestion or heartburn.   CoQ-10 100 MG Caps Take 100 mg by mouth daily.   diclofenac 75 MG EC tablet Commonly known as: VOLTAREN Take 1 tablet (75 mg total) by mouth 2 (two) times daily.   ezetimibe 10 MG tablet Commonly known as: ZETIA Take 1 tablet (10 mg total) by mouth daily.   famotidine 20 MG tablet Commonly known as: PEPCID Take 1 tablet (20 mg total) by mouth daily.   fluticasone 50 MCG/ACT nasal spray Commonly known as: FLONASE Place 2 sprays into both nostrils daily.   hydrochlorothiazide 12.5 MG capsule Commonly known as: MICROZIDE Take 1 capsule (12.5 mg total) by mouth daily as needed.   hyoscyamine 0.125 MG tablet Commonly known as: LEVSIN Take 1 tablet (0.125 mg total) by mouth every 6 (six) hours as needed.   levocetirizine 5 MG tablet Commonly known as: XYZAL Take 1 tablet (5 mg total) by mouth every evening.   mometasone 0.1 % lotion Commonly known as: ELOCON Apply 1 application topically daily as needed (ear irritation).   montelukast 10 MG tablet Commonly known as: SINGULAIR TAKE 1 TABLET BY MOUTH EVERYDAY AT BEDTIME   mupirocin ointment 2 % Commonly known as: BACTROBAN Apply 1 application topically 2 (two) times daily.   ondansetron 8 MG  disintegrating tablet Commonly known as: ZOFRAN-ODT Take 1 tablet (8 mg total) by mouth every 8 (eight) hours as needed for nausea or vomiting.   pantoprazole 40 MG tablet Commonly known as: PROTONIX Take 1 tablet (40 mg total) by mouth daily.   Probiotic Caps Take 1 capsule by mouth daily.   rosuvastatin 10 MG tablet Commonly known as: CRESTOR TAKE 1 TABLET ONCE A WEEK FOR 1 WEEK, THEN INCREASE TO TAKING 1 TABLET TWICE A WEEK   Symbicort 80-4.5 MCG/ACT inhaler Generic drug: budesonide-formoterol INHALE TWO PUFFS INTO  THE LUNGS IN THE MORNING AND AT BEDTIME   THERATEARS OP Place 1 drop into both eyes 2 (two) times daily.   tiZANidine 4 MG tablet Commonly known as: ZANAFLEX Take 1 tablet (4 mg total) by mouth every 6 (six) hours as needed for muscle spasms.   VITAMIN A PO Take 1 tablet by mouth daily.   vitamin C 1000 MG tablet Take 3,000 mg by mouth daily.   Vitamin D 125 MCG (5000 UT) Caps Take 5,000 Units by mouth daily.   vitamin E 1000 UNIT capsule Take 1,000 Units by mouth daily.   zinc gluconate 50 MG tablet Take 100 mg by mouth daily.   zolpidem 5 MG tablet Commonly known as: AMBIEN Take 1 tablet (5 mg total) by mouth at bedtime. As needed       All past medical history, surgical history, allergies, family history, immunizations andmedications were updated in the EMR today and reviewed under the history and medication portions of their EMR.     ROS: Negative, with the exception of above mentioned in HPI   Objective:  BP (!) 117/58   Pulse (!) 59   Temp 98.7 F (37.1 C) (Oral)   Ht 4' 10.25" (1.48 m)   Wt 140 lb (63.5 kg)   SpO2 98%   BMI 29.01 kg/m  Body mass index is 29.01 kg/m. Gen: Afebrile. No acute distress. Nontoxic in appearance, well developed, well nourished.  HENT: AT. [place]. MMM, no oral lesions. Throat without erythema or exudates.  Cough and hoarseness present. Eyes:Pupils Equal Round Reactive to light, Extraocular movements intact,  Conjunctiva without redness, discharge or icterus. Neck/lymp/endocrine: Supple, no lymphadenopathy CV: RRR, no edema Chest: CTAB, no wheeze or crackles. Good air movement, normal resp effort.  MSK: Trigger finger present right ring finger.  Neurovascular intact distally Neuro: Normal gait. PERLA. EOMi. Alert. Oriented x3  Psych: Normal affect, dress and demeanor. Normal speech. Normal thought content and judgment.  No exam data present No results found. No results found for this or any previous visit (from the past 24  hour(s)).  Assessment/Plan: CHANAY NUGENT is a 70 y.o. female present for OV for  Trigger ring finger of right hand - Ambulatory referral to Sports Medicine  Chronic cough/Family history of lung cancer/fatigue/History of bacterial pneumonia Multiple family members with lung malignancy.  Patient has had a chronic cough greater than 2 years that is worsening, along with fatigue.  Prior history of pneumonia is present.  She has failed conservative measures with allergy and reflux treatments.  Concern for potential lung disease or sarcoid causing chronic cough. - CT Chest W Contrast; Future Continue inhalers as prescribed Continue reflux medicine. Continue allergy regimen Referral to pulmonology will be placed if indicated after CT.    Reviewed expectations re: course of current medical issues.  Discussed self-management of symptoms.  Outlined signs and symptoms indicating need for more acute intervention.  Patient verbalized understanding and all  questions were answered.  Patient received an After-Visit Summary.    Orders Placed This Encounter  Procedures  . CT Chest W Contrast  . Ambulatory referral to Sports Medicine   No orders of the defined types were placed in this encounter.   Referral Orders     Ambulatory referral to Sports Medicine   Note is dictated utilizing voice recognition software. Although note has been proof read prior to signing, occasional typographical errors still can be missed. If any questions arise, please do not hesitate to call for verification.   electronically signed by:  Howard Pouch, DO  [place]

## 2020-12-29 NOTE — Patient Instructions (Addendum)
I have referred you to sports med for finger  And  CT chest for cough.   Cough, Adult A cough helps to clear your throat and lungs. A cough may be a sign of an illness or another medical condition. An acute cough may only last 2-3 weeks, while a chronic cough may last 8 or more weeks. Many things can cause a cough. They include:  Germs (viruses or bacteria) that attack the airway.  Breathing in things that bother (irritate) your lungs.  Allergies.  Asthma.  Mucus that runs down the back of your throat (postnasal drip).  Smoking.  Acid backing up from the stomach into the tube that moves food from the mouth to the stomach (gastroesophageal reflux).  Some medicines.  Lung problems.  Other medical conditions, such as heart failure or a blood clot in the lung (pulmonary embolism). Follow these instructions at home: Medicines  Take over-the-counter and prescription medicines only as told by your doctor.  Talk with your doctor before you take medicines that stop a cough (cough suppressants). Lifestyle  Do not smoke, and try not to be around smoke. Do not use any products that contain nicotine or tobacco, such as cigarettes, e-cigarettes, and chewing tobacco. If you need help quitting, ask your doctor.  Drink enough fluid to keep your pee (urine) pale yellow.  Avoid caffeine.  Do not drink alcohol if your doctor tells you not to drink.   General instructions  Watch for any changes in your cough. Tell your doctor about them.  Always cover your mouth when you cough.  Stay away from things that make you cough, such as perfume, candles, campfire smoke, or cleaning products.  If the air is dry, use a cool mist vaporizer or humidifier in your home.  If your cough is worse at night, try using extra pillows to raise your head up higher while you sleep.  Rest as needed.  Keep all follow-up visits as told by your doctor. This is important.   Contact a doctor if:  You have  new symptoms.  You cough up pus.  Your cough does not get better after 2-3 weeks, or your cough gets worse.  Cough medicine does not help your cough and you are not sleeping well.  You have pain that gets worse or pain that is not helped with medicine.  You have a fever.  You are losing weight and you do not know why.  You have night sweats. Get help right away if:  You cough up blood.  You have trouble breathing.  Your heartbeat is very fast. These symptoms may be an emergency. Do not wait to see if the symptoms will go away. Get medical help right away. Call your local emergency services (911 in the [place] Do not drive yourself to the hospital. Summary  A cough helps to clear your throat and lungs. Many things can cause a cough.  Take over-the-counter and prescription medicines only as told by your doctor.  Always cover your mouth when you cough.  Contact a doctor if you have new symptoms or you have a cough that does not get better or gets worse. This information is not intended to replace advice given to you by your health care provider. Make sure you discuss any questions you have with your health care provider. Document Revised: 11/19/2019 Document Reviewed: 10/19/2018 Elsevier Patient Education  [place].

## 2021-01-01 ENCOUNTER — Telehealth: Payer: Self-pay | Admitting: Family Medicine

## 2021-01-01 DIAGNOSIS — R059 Cough, unspecified: Secondary | ICD-10-CM

## 2021-01-01 DIAGNOSIS — Z801 Family history of malignant neoplasm of trachea, bronchus and lung: Secondary | ICD-10-CM

## 2021-01-01 NOTE — Telephone Encounter (Signed)
[  2:22 PM] Bethann Humble this is Rita Levine, I do CT scans @ [facility]. I see you ordered a CT Chest with contrast on MRN: [id number] and she's 52yr and she needs a creat prior to scheduling her CT Scan. Thank you  Imaging protocol faxed over as well.

## 2021-01-01 NOTE — Telephone Encounter (Signed)
Please call pt and have her schedule a lab appt only for kidney fx test- the image center is requiring this prior to scheduling her CT  thanks

## 2021-01-02 ENCOUNTER — Encounter: Payer: Self-pay | Admitting: Family Medicine

## 2021-01-02 ENCOUNTER — Telehealth: Payer: Self-pay | Admitting: Interventional Cardiology

## 2021-01-02 DIAGNOSIS — Z01812 Encounter for preprocedural laboratory examination: Secondary | ICD-10-CM

## 2021-01-02 NOTE — Telephone Encounter (Signed)
Kuneff, Renee A, DO (2:45 PM)      Please call pt and have her schedule a lab appt only for kidney fx test- the image center is requiring this prior to scheduling her

## 2021-01-02 NOTE — Telephone Encounter (Signed)
BMP added.  Patient made aware

## 2021-01-02 NOTE — Telephone Encounter (Signed)
Pt sched for labs. Will ask cardio if the can do the labs on the 24th and cancel our appt if can.

## 2021-01-02 NOTE — Telephone Encounter (Signed)
Please see other encounter.

## 2021-01-02 NOTE — Telephone Encounter (Signed)
Pt called and would like to know if Dr Irish Lack could add to the blood order a Kidney function test .  She has to have this blood work in order to have the CT that has been ordered.  She would like to know since she already coming if this could be added so she only has to be stuck 1 time    Best number  [phone]

## 2021-01-03 ENCOUNTER — Other Ambulatory Visit: Payer: Medicare HMO

## 2021-01-03 DIAGNOSIS — M8580 Other specified disorders of bone density and structure, unspecified site: Secondary | ICD-10-CM | POA: Diagnosis not present

## 2021-01-03 DIAGNOSIS — M85852 Other specified disorders of bone density and structure, left thigh: Secondary | ICD-10-CM | POA: Diagnosis not present

## 2021-01-03 LAB — HM DEXA SCAN: HM Dexa Scan: LOW

## 2021-01-03 MED ORDER — FEXOFENADINE HCL 180 MG PO TABS: 180.0000 mg | ORAL_TABLET | Freq: Every day | ORAL | 1 refills | Status: DC

## 2021-01-04 ENCOUNTER — Other Ambulatory Visit: Payer: Medicare HMO

## 2021-01-04 ENCOUNTER — Telehealth: Payer: Self-pay

## 2021-01-04 ENCOUNTER — Other Ambulatory Visit: Payer: Self-pay

## 2021-01-04 DIAGNOSIS — Z01812 Encounter for preprocedural laboratory examination: Secondary | ICD-10-CM | POA: Diagnosis not present

## 2021-01-04 DIAGNOSIS — E782 Mixed hyperlipidemia: Secondary | ICD-10-CM | POA: Diagnosis not present

## 2021-01-04 LAB — BASIC METABOLIC PANEL
BUN/Creatinine Ratio: 16 (ref 12–28)
BUN: 12 mg/dL (ref 8–27)
CO2: 21 mmol/L (ref 20–29)
Calcium: 9.5 mg/dL (ref 8.7–10.3)
Chloride: 102 mmol/L (ref 96–106)
Creatinine, Ser: 0.74 mg/dL (ref 0.57–1.00)
Glucose: 113 mg/dL — ABNORMAL HIGH (ref 65–99)
Potassium: 4.5 mmol/L (ref 3.5–5.2)
Sodium: 138 mmol/L (ref 134–144)
eGFR: 88 mL/min/{1.73_m2} (ref >59)

## 2021-01-04 LAB — LIPID PANEL
Chol/HDL Ratio: 3.4 ratio (ref 0.0–4.4)
Cholesterol, Total: 167 mg/dL (ref 100–199)
HDL: 49 mg/dL (ref >39)
LDL Chol Calc (NIH): 92 mg/dL (ref 0–99)
Triglycerides: 149 mg/dL (ref 0–149)
VLDL Cholesterol Cal: 26 mg/dL (ref 5–40)

## 2021-01-04 LAB — HEPATIC FUNCTION PANEL
ALT: 16 IU/L (ref 0–32)
AST: 22 IU/L (ref 0–40)
Albumin: 4.3 g/dL (ref 3.8–4.8)
Alkaline Phosphatase: 96 IU/L (ref 44–121)
Bilirubin Total: 0.3 mg/dL (ref 0.0–1.2)
Bilirubin, Direct: 0.11 mg/dL (ref 0.00–0.40)
Total Protein: 6.5 g/dL (ref 6.0–8.5)

## 2021-01-04 NOTE — Telephone Encounter (Signed)
Received DEXA bone density results from [facility] on 01/03/21. HM updated. Placed on PCP desk for review.

## 2021-01-05 NOTE — Telephone Encounter (Signed)
Please inform patient her bone density scan overall is good.  She is just mildly in the osteopenic range, which is mild decrease in bone density.  But again just barely outside of the normal range. -Continue the calcium and vitamin D as she has been taking. -Routine daily weightbearing exercises, such as even walking on a hard surface daily is helpful in maintaining bone density.  Repeat bone density recommended in 2 years

## 2021-01-05 NOTE — Telephone Encounter (Signed)
Spoke with patient regarding results/recommendations.  

## 2021-01-08 ENCOUNTER — Ambulatory Visit: Payer: Medicare HMO

## 2021-01-09 ENCOUNTER — Telehealth: Payer: Self-pay

## 2021-01-09 NOTE — Telephone Encounter (Signed)
Mardene Celeste is calling in from [facility] stating that the Francia Greaves is not on there formulary list this year, but Levothyroxine is. If patient can not tolerate the Levothyroxine then Dr.Kuneff can submit non formulary request.

## 2021-01-09 NOTE — Telephone Encounter (Signed)
Pt is aware that Rx is not on formulary and is paying oop; does not want to change Rx

## 2021-01-10 ENCOUNTER — Ambulatory Visit: Payer: Medicare HMO

## 2021-01-10 ENCOUNTER — Other Ambulatory Visit: Payer: Self-pay

## 2021-01-11 ENCOUNTER — Ambulatory Visit (INDEPENDENT_AMBULATORY_CARE_PROVIDER_SITE_OTHER): Payer: Medicare HMO | Admitting: Sports Medicine

## 2021-01-11 ENCOUNTER — Ambulatory Visit (INDEPENDENT_AMBULATORY_CARE_PROVIDER_SITE_OTHER): Payer: Medicare HMO

## 2021-01-11 DIAGNOSIS — M65311 Trigger thumb, right thumb: Secondary | ICD-10-CM

## 2021-01-11 DIAGNOSIS — M65332 Trigger finger, left middle finger: Secondary | ICD-10-CM | POA: Insufficient documentation

## 2021-01-11 DIAGNOSIS — M6530 Trigger finger, unspecified finger: Secondary | ICD-10-CM | POA: Insufficient documentation

## 2021-01-11 DIAGNOSIS — M65341 Trigger finger, right ring finger: Secondary | ICD-10-CM | POA: Diagnosis not present

## 2021-01-11 DIAGNOSIS — M65331 Trigger finger, right middle finger: Secondary | ICD-10-CM | POA: Insufficient documentation

## 2021-01-11 HISTORY — DX: Trigger thumb, left thumb: M65.311

## 2021-01-11 HISTORY — DX: Trigger finger, unspecified finger: M65.30

## 2021-01-11 NOTE — Progress Notes (Signed)
    Procedures performed today:    Procedure: Real-time Ultrasound Guided injection of the right fourth flexor tendon sheath Device: Samsung HS60  Verbal informed consent obtained.  Time-out conducted.  Noted no overlying erythema, induration, or other signs of local infection.  Skin prepped in a sterile fashion.  Local anesthesia: Topical Ethyl chloride.  With sterile technique and under real time ultrasound guidance:  Small visible nodule, 1/2 cc kenalog 40, 1/2 cc lidocaine injected easily.   Completed without difficulty  Advised to call if fevers/chills, erythema, induration, drainage, or persistent bleeding.  Images permanently stored and available for review in PACS.  Impression: Technically successful ultrasound guided injection.  Independent interpretation of notes and tests performed by another provider:   None.  Brief History, Exam, Impression, and Recommendations:    Trigger finger, right ring finger Right ring trigger finger, right middle finger was injected sometime in the past and did well. Today we injected her right fourth flexor tendon sheath, rehab exercises given, explained the anatomy and pathophysiology, return to see me in a month.    ___________________________________________ Gwen Her. Dianah Field, M.D., ABFM., CAQSM. Primary Care and [place] Instructor of [place] of [facility] of Medicine

## 2021-01-11 NOTE — Assessment & Plan Note (Signed)
Right ring trigger finger, right middle finger was injected sometime in the past and did well. Today we injected her right fourth flexor tendon sheath, rehab exercises given, explained the anatomy and pathophysiology, return to see me in a month.

## 2021-01-15 ENCOUNTER — Ambulatory Visit (INDEPENDENT_AMBULATORY_CARE_PROVIDER_SITE_OTHER): Payer: Medicare HMO

## 2021-01-15 ENCOUNTER — Other Ambulatory Visit: Payer: Self-pay

## 2021-01-15 DIAGNOSIS — R053 Chronic cough: Secondary | ICD-10-CM | POA: Diagnosis not present

## 2021-01-15 DIAGNOSIS — Z801 Family history of malignant neoplasm of trachea, bronchus and lung: Secondary | ICD-10-CM

## 2021-01-15 DIAGNOSIS — Z8701 Personal history of pneumonia (recurrent): Secondary | ICD-10-CM

## 2021-01-15 DIAGNOSIS — R5383 Other fatigue: Secondary | ICD-10-CM | POA: Diagnosis not present

## 2021-01-15 DIAGNOSIS — J9811 Atelectasis: Secondary | ICD-10-CM | POA: Diagnosis not present

## 2021-01-15 DIAGNOSIS — I2510 Atherosclerotic heart disease of native coronary artery without angina pectoris: Secondary | ICD-10-CM | POA: Diagnosis not present

## 2021-01-15 MED ORDER — IOHEXOL 300 MG/ML  SOLN
100.0000 mL | Freq: Once | INTRAMUSCULAR | Status: AC | PRN
  Administered 2021-01-15: 75 mL via INTRAVENOUS

## 2021-01-16 ENCOUNTER — Telehealth: Payer: Self-pay | Admitting: Family Medicine

## 2021-01-16 ENCOUNTER — Encounter: Payer: Self-pay | Admitting: Family Medicine

## 2021-01-16 DIAGNOSIS — M5135 Other intervertebral disc degeneration, thoracolumbar region: Secondary | ICD-10-CM

## 2021-01-16 DIAGNOSIS — I2510 Atherosclerotic heart disease of native coronary artery without angina pectoris: Secondary | ICD-10-CM | POA: Insufficient documentation

## 2021-01-16 DIAGNOSIS — I2584 Coronary atherosclerosis due to calcified coronary lesion: Secondary | ICD-10-CM

## 2021-01-16 NOTE — Telephone Encounter (Signed)
Spoke with patient regarding results/recommendations.  

## 2021-01-16 NOTE — Telephone Encounter (Signed)
Please inform patient the following information: Her chest CT results did not show any concerning cause for her chronic cough. It did result with: -Very small, "tiny ", hiatal hernia noted.  This is a hernia in the location near her upper stomach.  This is common, but can cause more reflux symptoms and can lead to chronic tickle cough.  Continuing the Protonix and Pepcid is indicated for this type of cough.  Any further recommendations will need to come from her gastroenterology team -  There is a 3 mm nodule in the right upper lung that does not appear worrisome (appears benign), but if she is considered higher risk then we can repeat CT in 12 months to ensure it is stable.  Although she has never smoked she does have a family history of lung cancer therefore we will make sure she gets her 32-month CT chest follow-up 01/2022 -Moderate aortic atherosclerosis and "occasional" coronary artery calcifications.  These are cholesterol/plaque buildup in her aorta and the coronary arteries which surround the heart.    -Control of cholesterol, continuing Crestor and a heart healthy diet are recommended.    Lastly,  there was some incidental muscle skeletal findings of arthritis in her thoracic spine.

## 2021-02-01 ENCOUNTER — Other Ambulatory Visit: Payer: Self-pay | Admitting: Family Medicine

## 2021-02-08 ENCOUNTER — Other Ambulatory Visit: Payer: Self-pay

## 2021-02-08 ENCOUNTER — Ambulatory Visit (INDEPENDENT_AMBULATORY_CARE_PROVIDER_SITE_OTHER): Payer: Medicare HMO

## 2021-02-08 ENCOUNTER — Ambulatory Visit (INDEPENDENT_AMBULATORY_CARE_PROVIDER_SITE_OTHER): Payer: Medicare HMO | Admitting: Sports Medicine

## 2021-02-08 DIAGNOSIS — M65312 Trigger thumb, left thumb: Secondary | ICD-10-CM | POA: Diagnosis not present

## 2021-02-08 DIAGNOSIS — M65341 Trigger finger, right ring finger: Secondary | ICD-10-CM

## 2021-02-08 DIAGNOSIS — M65322 Trigger finger, left index finger: Secondary | ICD-10-CM

## 2021-02-08 DIAGNOSIS — M65321 Trigger finger, right index finger: Secondary | ICD-10-CM | POA: Diagnosis not present

## 2021-02-08 DIAGNOSIS — M65342 Trigger finger, left ring finger: Secondary | ICD-10-CM | POA: Diagnosis not present

## 2021-02-08 DIAGNOSIS — M65332 Trigger finger, left middle finger: Secondary | ICD-10-CM

## 2021-02-08 DIAGNOSIS — M65352 Trigger finger, left little finger: Secondary | ICD-10-CM

## 2021-02-08 DIAGNOSIS — M65311 Trigger thumb, right thumb: Secondary | ICD-10-CM

## 2021-02-08 DIAGNOSIS — M65331 Trigger finger, right middle finger: Secondary | ICD-10-CM

## 2021-02-08 DIAGNOSIS — M65351 Trigger finger, right little finger: Secondary | ICD-10-CM

## 2021-02-08 NOTE — Progress Notes (Signed)
    Procedures performed today:    Procedure: Real-time Ultrasound Guided injection of the left fourth flexor tendon sheath Device: Samsung HS60  Verbal informed consent obtained.  Time-out conducted.  Noted no overlying erythema, induration, or other signs of local infection.  Skin prepped in a sterile fashion.  Local anesthesia: Topical Ethyl chloride.  With sterile technique and under real time ultrasound guidance:  Noted flexor tendon nodule, 1/2 cc lidocaine, 1/2 cc kenalog 40 injected easily.   Completed without difficulty  Advised to call if fevers/chills, erythema, induration, drainage, or persistent bleeding.  Images permanently stored and available for review in PACS.  Impression: Technically successful ultrasound guided injection.  Independent interpretation of notes and tests performed by another provider:   None.  Brief History, Exam, Impression, and Recommendations:    Trigger finger of left and right ring fingers Asti returns, she is a pleasant 70 year old female, at the last visit we injected her right ring finger, she returns today symptom free, she is having similar pain on the left side with triggering, we did a flexor tendon sheath injection on the left today, return to see me as needed. She does continue with her conditioning exercises.    ___________________________________________ Gwen Her. Dianah Field, M.D., ABFM., CAQSM. Primary Care and [place] Instructor of [place] of [facility] of Medicine

## 2021-02-08 NOTE — Assessment & Plan Note (Signed)
Rita Levine returns, she is a pleasant 70 year old female, at the last visit we injected her right ring finger, she returns today symptom free, she is having similar pain on the left side with triggering, we did a flexor tendon sheath injection on the left today, return to see me as needed. She does continue with her conditioning exercises.

## 2021-02-25 NOTE — Progress Notes (Signed)
Cardiology Office Note   Date:  02/26/2021   ID:  Rita Levine, DOB 1951/03/23, MRN [id number]  PCP:  Ma Hillock, DO    No chief complaint on file.  Aortic atherosclerosis  Wt Readings from Last 3 Encounters:  02/26/21 143 lb (64.9 kg)  12/29/20 140 lb (63.5 kg)  11/28/20 139 lb (63 kg)       History of Present Illness: Rita Levine is a 70 y.o. female   With aortic atherosclerosis.  She is friends with Chrissie Noa and Ardis Rowan, who are my patients as well.   3/21 [place]:[id number]/[id number]: Advanced atherosclerotic calcifications involving the aorta and iliac arteries for the patient's age.  She has no family h/o CAD. Mother was 71 when she passed away. Siblings without CAD.   She had a negative ETT in 2015.  She had intolerance to Zocor in the past.Now tolerating Crestor 3x/week.     In the past, BP cuff will register irregular HR, and she feels lightheaded.  No syncope.    Monitor in 12/21 showed:  "Normal sinus rhythm.  Rare, brief runs of PACs.  No sustained pathologic arrhythmias."  BP has been controlled.  Not using losartan very often.  Does not take if systolic is less than 073 mm Hg.  Palpitations have occurred in the past few months.  4/22 CT chest reviewed: moderate aortic atherosclerosis.   Reports fatigue.  Has some DOE.   Denies : Chest pain. Dizziness. Leg edema. Nitroglycerin use. Orthopnea. Paroxysmal nocturnal dyspnea. Syncope.   Past Medical History:  Diagnosis Date  . Allergic rhinitis   . Allergic rhinitis due to pollen 12/03/2019  . Anemia   . Asthma   . Chest pain, unspecified 04/07/2014  . Chicken pox   . Colon polyp   . Diverticulosis   . Dysphagia   . Dysrhythmia    Palpitations  . Fibromyalgia   . Food allergy   . Gallstone   . GERD (gastroesophageal reflux disease)   . Hiatal hernia   . Hypercholesteremia   . Hypertension   . Hypothyroidism   . IBS (irritable bowel syndrome)   . Migraine headache     occasionally  . Osteoarthritis   . Osteopenia   . Pneumonia   . PONV (postoperative nausea and vomiting)   . Pulmonary hypertension ([place])    pt denies, pt states she was tested for it but was not diagnosed with it  . Rectal bleeding   . SBO (small bowel obstruction) ([place]) 10/20/2017  . Seasonal allergies   . Thyroid nodule     Past Surgical History:  Procedure Laterality Date  . APPENDECTOMY  1982  . BREAST EXCISIONAL BIOPSY Right 1995   benign  . BREAST LUMPECTOMY  1991  . CARPAL TUNNEL RELEASE Right 2006   Right wrist  . CESAREAN SECTION     x 2  . CHOLECYSTECTOMY N/A 07/25/2020   Procedure: LAPAROSCOPIC CHOLECYSTECTOMY;  Surgeon: [name], MD;  Location: [place];  Service: General;  Laterality: N/A;  . COLON RESECTION  1990  . COLONOSCOPY    . CT ABDOMEN PELVIS W ([place] HX)  01/03/2020  . IMAGE MRI brain:  04/2012   No focal IAC or inner ear lesion to explain hearing loss. Slightly greater than expected number of subcortical T2- hyperintensities bilaterally.  These are nonspecific.  They can be seen in the setting of chronic migraine headaches, demyelinating process, chronic microvascular ischemic, prior infection or inflammation, or vasculitis  . IMAGE  MRI lumbar:  05/01/2006   Mild central canal stenosis with facet arthropathy and mildly bulging disc at L4-5.  There is mild narrowing in the left lateral recess at this level.  No definite neural impingement.  Appearance at this level has not markedly Moderately severe facet arthropathy at L5-S1 with mild interval progression of a diffuse broad based disc bulge.  There is mild biforaminal narrowing.  Central canal is open  . LAPAROSCOPIC ABDOMINAL EXPLORATION  2019   adhesion removal> caused bowel blockage   . NASAL SEPTUM SURGERY  2004  . TONSILLECTOMY  1965  . TOTAL ABDOMINAL HYSTERECTOMY W/ BILATERAL SALPINGOOPHORECTOMY  1988  . TUBAL LIGATION  1974  . UPPER GASTROINTESTINAL ENDOSCOPY  2020     Current Outpatient  Medications  Medication Sig Dispense Refill  . albuterol (PROAIR HFA) 108 (90 Base) MCG/ACT inhaler Inhale 2 puffs into the lungs every 6 (six) hours as needed. 6.7 g 5  . ARMOUR THYROID 15 MG tablet Take 1 tablet by mouth in the AM Once a day 90 tablet 3  . ARMOUR THYROID 30 MG tablet Take 1 tablet by mouth in the AM once a day 90 tablet 3  . Ascorbic Acid (VITAMIN C) 1000 MG tablet Take 3,000 mg by mouth daily.    Marland Kitchen atenolol (TENORMIN) 25 MG tablet Take 0.5-1 tablets (12.5-25 mg total) by mouth daily. 90 tablet 1  . calcium carbonate (TUMS - DOSED IN MG ELEMENTAL CALCIUM) 500 MG chewable tablet Chew 3 tablets by mouth daily as needed for indigestion or heartburn.    . Carboxymethylcellulose Sodium (THERATEARS OP) Place 1 drop into both eyes 2 (two) times daily.    . Cholecalciferol (VITAMIN D) 125 MCG (5000 UT) CAPS Take 5,000 Units by mouth daily.     . Coenzyme Q10 (COQ-10) 100 MG CAPS Take 100 mg by mouth daily.    . diclofenac (VOLTAREN) 75 MG EC tablet Take 1 tablet (75 mg total) by mouth 2 (two) times daily. 180 tablet 2  . ezetimibe (ZETIA) 10 MG tablet Take 1 tablet (10 mg total) by mouth daily. 90 tablet 3  . famotidine (PEPCID) 20 MG tablet Take 1 tablet (20 mg total) by mouth daily. 90 tablet 1  . fexofenadine (ALLERGY RELIEF) 180 MG tablet Take 1 tablet (180 mg total) by mouth daily. 90 tablet 1  . fluticasone (FLONASE) 50 MCG/ACT nasal spray Place 2 sprays into both nostrils daily. 16 mL 11  . hydrochlorothiazide (MICROZIDE) 12.5 MG capsule Take 1 capsule (12.5 mg total) by mouth daily as needed. 45 capsule 1  . hyoscyamine (LEVSIN) 0.125 MG tablet Take 1 tablet (0.125 mg total) by mouth every 6 (six) hours as needed. 120 tablet 1  . levocetirizine (XYZAL) 5 MG tablet Take 1 tablet (5 mg total) by mouth every evening. 90 tablet 3  . mometasone (ELOCON) 0.1 % lotion Apply 1 application topically daily as needed (ear irritation).     . montelukast (SINGULAIR) 10 MG tablet TAKE 1  TABLET BY MOUTH EVERYDAY AT BEDTIME 90 tablet 1  . mupirocin ointment (BACTROBAN) 2 % Apply 1 application topically 2 (two) times daily. 30 g 2  . ondansetron (ZOFRAN-ODT) 8 MG disintegrating tablet Take 1 tablet (8 mg total) by mouth every 8 (eight) hours as needed for nausea or vomiting. 30 tablet 1  . pantoprazole (PROTONIX) 40 MG tablet Take 1 tablet (40 mg total) by mouth daily. 90 tablet 1  . Probiotic CAPS Take 1 capsule by mouth daily.    Marland Kitchen  rosuvastatin (CRESTOR) 10 MG tablet TAKE 1 TABLET ONCE A WEEK FOR 1 WEEK, THEN INCREASE TO TAKING 1 TABLET TWICE A WEEK (Patient taking differently: Take 10 mg by mouth. TAKE 1 TABLET [place] MOUTH THREE TIMES PER WEEK.) 26 tablet 2  . SYMBICORT 80-4.5 MCG/ACT inhaler INHALE TWO PUFFS INTO THE LUNGS IN THE MORNING AND AT BEDTIME 30.6 g 0  . tiZANidine (ZANAFLEX) 4 MG tablet Take 1 tablet (4 mg total) by mouth every 6 (six) hours as needed for muscle spasms. 120 tablet 5  . VITAMIN A PO Take 1 tablet by mouth daily.    . vitamin E 1000 UNIT capsule Take 1,000 Units by mouth daily.    Marland Kitchen zinc gluconate 50 MG tablet Take 100 mg by mouth daily.    Marland Kitchen zolpidem (AMBIEN) 5 MG tablet Take 1 tablet (5 mg total) by mouth at bedtime. As needed 90 tablet 1   No current facility-administered medications for this visit.    Allergies:   Sulfamethoxazole-trimethoprim, Avelox [moxifloxacin hcl in nacl], Cephalexin, Codeine, Erythromycin, Flagyl [metronidazole], Propranolol, Sulfa antibiotics, Tetanus toxoid, Tramadol, and Statins    Social History:  The patient  reports that she has never smoked. She has never used smokeless tobacco. She reports current alcohol use. She reports that she does not use drugs.   Family History:  The patient's family history includes Alcohol abuse in her brother and sister; Arthritis in her brother, brother, father, and mother; Asthma in her father; Brain cancer in her sister; Breast cancer in her cousin and maternal aunt; COPD in her brother,  father, sister, sister, and sister; Cancer in her sister; Clotting disorder in her sister; Depression in her brother; Diabetes in her brother and father; Diabetes type II in her sister; Early death in her father, sister, and sister; Emphysema in her maternal aunt and maternal uncle; Hearing loss in her mother; Heart disease in her mother and sister; Hypercholesterolemia in her brother; Hyperlipidemia in her father and mother; Hypertension in her father, mother, and sister; Kidney disease in her mother; Lung cancer in her sister and another family member; Macular degeneration in her mother; Parkinson's disease in her father; Throat cancer in her brother; Thyroid cancer in her sister.    ROS:  Please see the history of present illness.   Otherwise, review of systems are positive for palpitations.   All other systems are reviewed and negative.    PHYSICAL EXAM: VS:  BP 118/68   Pulse (!) 58   Ht 4\' 10"  (1.473 m)   Wt 143 lb (64.9 kg)   SpO2 97%   BMI 29.89 kg/m  , BMI Body mass index is 29.89 kg/m. GEN: Well nourished, well developed, in no acute distress  HEENT: normal  Neck: no JVD, carotid bruits, or masses Cardiac: RRR; no murmurs, rubs, or gallops,no edema  Respiratory:  clear to auscultation bilaterally, normal work of breathing GI: soft, nontender, nondistended, + BS MS: no deformity or atrophy  Skin: warm and dry, no rash Neuro:  Strength and sensation are intact Psych: euthymic mood, full affect   EKG:   The ekg ordered today demonstrates normal sinus rhythm, no ST-T wave changes   Recent Labs: 11/28/2020: Hemoglobin 13.7; Platelets 333; TSH 1.05 01/04/2021: ALT 16; BUN 12; Creatinine, Ser 0.74; Potassium 4.5; Sodium 138   Lipid Panel    Component Value Date/Time   CHOL 167 01/04/2021 1001   TRIG 149 01/04/2021 1001   HDL 49 01/04/2021 1001   CHOLHDL 3.4 01/04/2021 1001  CHOLHDL 4.3 11/28/2020 1342   LDLCALC 92 01/04/2021 1001   LDLCALC 90 11/28/2020 1342     Other  studies Reviewed: Additional studies/ records that were reviewed today with results demonstrating: Labs reviewed, CT scan reviewed from April 2022.   ASSESSMENT AND PLAN:  1. Aortic atherosclerosis: Continue aggressive secondary prevention.  Whole food, plant-based diet recommended.  High-fiber info given. 2. Palpitations: PACs noted on prior monitor.  She continues to have some symptoms.  Continue atenolol.  She adjusts the dose based on her symptoms. 3. Hyperlipidemia: Tolerating Crestor 3 times a week.  LDL less than 100 at last check.  LDL 92 in March 2022.  She will let us know if there is a change in her exercise tolerance.   Current medicines are reviewed at length with the patient today.  The patient concerns regarding her medicines were addressed.  The following changes have been made:  No change  Labs/ tests ordered today include:  No orders of the defined types were placed in this encounter.   Recommend 150 minutes/week of aerobic exercise Low fat, low carb, high fiber diet recommended  Disposition:   FU in 1 year   Signed, Larae Grooms, MD  02/26/2021 10:51 AM    [facility] [address], [place], [place]  [address] Phone: [phone]; Fax: [phone]

## 2021-02-26 ENCOUNTER — Other Ambulatory Visit: Payer: Self-pay | Admitting: Interventional Cardiology

## 2021-02-26 ENCOUNTER — Ambulatory Visit: Payer: Medicare HMO | Admitting: Interventional Cardiology

## 2021-02-26 ENCOUNTER — Other Ambulatory Visit: Payer: Self-pay

## 2021-02-26 ENCOUNTER — Encounter: Payer: Self-pay | Admitting: Interventional Cardiology

## 2021-02-26 ENCOUNTER — Other Ambulatory Visit: Payer: Self-pay | Admitting: Family Medicine

## 2021-02-26 VITALS — BP 118/68 | HR 58 | Ht <= 58 in | Wt 143.0 lb

## 2021-02-26 DIAGNOSIS — R002 Palpitations: Secondary | ICD-10-CM | POA: Diagnosis not present

## 2021-02-26 DIAGNOSIS — I491 Atrial premature depolarization: Secondary | ICD-10-CM | POA: Diagnosis not present

## 2021-02-26 DIAGNOSIS — I700 Atherosclerosis of aorta: Secondary | ICD-10-CM

## 2021-02-26 DIAGNOSIS — E782 Mixed hyperlipidemia: Secondary | ICD-10-CM

## 2021-02-26 MED ORDER — ROSUVASTATIN CALCIUM 10 MG PO TABS: 10.0000 mg | ORAL_TABLET | ORAL | 3 refills | Status: DC

## 2021-02-26 NOTE — Patient Instructions (Signed)
Medication Instructions:  Your physician recommends that you continue on your current medications as directed. Please refer to the Current Medication list given to you today.  *If you need a refill on your cardiac medications before your next appointment, please call your pharmacy*   Lab Work: none If you have labs (blood work) drawn today and your tests are completely normal, you will receive your results only by: . MyChart Message (if you have MyChart) OR . A paper copy in the mail If you have any lab test that is abnormal or we need to change your treatment, we will call you to review the results.   Testing/Procedures: none   Follow-Up: At [facility], you and your health needs are our priority.  As part of our continuing mission to provide you with exceptional heart care, we have created designated Provider Care Teams.  These Care Teams include your primary Cardiologist (physician) and Advanced Practice Providers (APPs -  Physician Assistants and Nurse Practitioners) who all work together to provide you with the care you need, when you need it.  We recommend signing up for the patient portal called "MyChart".  Sign up information is provided on this After Visit Summary.  MyChart is used to connect with patients for Virtual Visits (Telemedicine).  Patients are able to view lab/test results, encounter notes, upcoming appointments, etc.  Non-urgent messages can be sent to your provider as well.   To learn more about what you can do with MyChart, go to [email or web address].    Your next appointment:   12 month(s)  The format for your next appointment:   In Person  Provider:   You may see Jayadeep Varanasi, MD or one of the following Advanced Practice Providers on your designated Care Team:    Dayna Dunn, PA-C  Michele Lenze, PA-C    Other Instructions  High-Fiber Eating Plan Fiber, also called dietary fiber, is a type of carbohydrate. It is found foods such as fruits,  vegetables, whole grains, and beans. A high-fiber diet can have many health benefits. Your health care provider may recommend a high-fiber diet to help:  Prevent constipation. Fiber can make your bowel movements more regular.  Lower your cholesterol.  Relieve the following conditions: ? Inflammation of veins in the anus (hemorrhoids). ? Inflammation of specific areas of the digestive tract (uncomplicated diverticulosis). ? A problem of the large intestine, also called the colon, that sometimes causes pain and diarrhea (irritable bowel syndrome, or IBS).  Prevent overeating as part of a weight-loss plan.  Prevent heart disease, type 2 diabetes, and certain cancers. What are tips for following this plan? Reading food labels  Check the nutrition facts label on food products for the amount of dietary fiber. Choose foods that have 5 grams of fiber or more per serving.  The goals for recommended daily fiber intake include: ? Men (age 50 or younger): 34-38 g. ? Men (over age 50): 28-34 g. ? Women (age 50 or younger): 25-28 g. ? Women (over age 50): 22-25 g. Your daily fiber goal is _____________ g.   Shopping  Choose whole fruits and vegetables instead of processed forms, such as apple juice or applesauce.  Choose a wide variety of high-fiber foods such as avocados, lentils, oats, and kidney beans.  Read the nutrition facts label of the foods you choose. Be aware of foods with added fiber. These foods often have high sugar and sodium amounts per serving. Cooking  Use whole-grain flour for baking and cooking.    Cook with brown rice instead of white rice. Meal planning  Start the day with a breakfast that is high in fiber, such as a cereal that contains 5 g of fiber or more per serving.  Eat breads and cereals that are made with whole-grain flour instead of refined flour or white flour.  Eat brown rice, bulgur wheat, or millet instead of white rice.  Use beans in place of meat in  soups, salads, and pasta dishes.  Be sure that half of the grains you eat each day are whole grains. General information  You can get the recommended daily intake of dietary fiber by: ? Eating a variety of fruits, vegetables, grains, nuts, and beans. ? Taking a fiber supplement if you are not able to take in enough fiber in your diet. It is better to get fiber through food than from a supplement.  Gradually increase how much fiber you consume. If you increase your intake of dietary fiber too quickly, you may have bloating, cramping, or gas.  Drink plenty of water to help you digest fiber.  Choose high-fiber snacks, such as berries, raw vegetables, nuts, and popcorn. What foods should I eat? Fruits Berries. Pears. Apples. Oranges. Avocado. Prunes and raisins. Dried figs. Vegetables Sweet potatoes. Spinach. Kale. Artichokes. Cabbage. Broccoli. Cauliflower. Green peas. Carrots. Squash. Grains Whole-grain breads. Multigrain cereal. Oats and oatmeal. Brown rice. Barley. Bulgur wheat. Millet. Quinoa. Bran muffins. Popcorn. Rye wafer crackers. Meats and other proteins Navy beans, kidney beans, and pinto beans. Soybeans. Split peas. Lentils. Nuts and seeds. Dairy Fiber-fortified yogurt. Beverages Fiber-fortified soy milk. Fiber-fortified orange juice. Other foods Fiber bars. The items listed above may not be a complete list of recommended foods and beverages. Contact a dietitian for more information. What foods should I avoid? Fruits Fruit juice. Cooked, strained fruit. Vegetables Fried potatoes. Canned vegetables. Well-cooked vegetables. Grains White bread. Pasta made with refined flour. White rice. Meats and other proteins Fatty cuts of meat. Fried chicken or fried fish. Dairy Milk. Yogurt. Cream cheese. Sour cream. Fats and oils Butters. Beverages Soft drinks. Other foods Cakes and pastries. The items listed above may not be a complete list of foods and beverages to avoid.  Talk with your dietitian about what choices are best for you. Summary  Fiber is a type of carbohydrate. It is found in foods such as fruits, vegetables, whole grains, and beans.  A high-fiber diet has many benefits. It can help to prevent constipation, lower blood cholesterol, aid weight loss, and reduce your risk of heart disease, diabetes, and certain cancers.  Increase your intake of fiber gradually. Increasing fiber too quickly may cause cramping, bloating, and gas. Drink plenty of water while you increase the amount of fiber you consume.  The best sources of fiber include whole fruits and vegetables, whole grains, nuts, seeds, and beans. This information is not intended to replace advice given to you by your health care provider. Make sure you discuss any questions you have with your health care provider. Document Revised: 02/03/2020 Document Reviewed: 02/03/2020 Elsevier Patient Education  2021 Elsevier Inc.   

## 2021-03-07 ENCOUNTER — Ambulatory Visit: Payer: Medicare HMO | Admitting: Sports Medicine

## 2021-03-15 ENCOUNTER — Encounter: Payer: Self-pay | Admitting: Family Medicine

## 2021-03-16 MED ORDER — EZETIMIBE 10 MG PO TABS: 10.0000 mg | ORAL_TABLET | Freq: Every day | ORAL | 3 refills | Status: DC

## 2021-03-23 ENCOUNTER — Ambulatory Visit (INDEPENDENT_AMBULATORY_CARE_PROVIDER_SITE_OTHER): Payer: Medicare HMO | Admitting: Family Medicine

## 2021-03-23 ENCOUNTER — Encounter: Payer: Self-pay | Admitting: Family Medicine

## 2021-03-23 ENCOUNTER — Other Ambulatory Visit: Payer: Self-pay

## 2021-03-23 VITALS — BP 135/75 | HR 61 | Temp 98.7°F | Ht 58.25 in | Wt 140.6 lb

## 2021-03-23 DIAGNOSIS — Z713 Dietary counseling and surveillance: Secondary | ICD-10-CM

## 2021-03-23 DIAGNOSIS — E663 Overweight: Secondary | ICD-10-CM

## 2021-03-23 NOTE — Patient Instructions (Addendum)
Great to see you today.    online resources for: Weekly net calorie calculator (below) Applications for calorie counting (my fittness pal).   ensure she is taking in adequate nutrition daily by meeting calorie goals. Never less than 1200 cal  glycemic index.low low low exercise goal of 220 minutes a week (plus warm up and cool down) of cardiovascular exercise.     - switch up exercise- aerobics etc.  -adequate water consumption of at least 80 ounces a day, more if exercising/sweating. >>> HR ~100-120.  Check once a week weight and then calculate. https://www.calculator.net/calorie-calculator.html   Fruits: stay with berries.  Veggies- avoid corn, carrots, peas  Avoid- rice, pasta, white potato, flour, sugar.    Start weight 140.

## 2021-03-23 NOTE — Progress Notes (Signed)
This visit occurred during the SARS-CoV-2 public health emergency.  Safety protocols were in place, including screening questions prior to the visit, additional usage of staff PPE, and extensive cleaning of exam room while observing appropriate contact time as indicated for disinfecting solutions.    Rita Levine , November 19, 1950, 70 y.o., female MRN: [id number] Patient Care Team    Relationship Specialty Notifications Start End  Ma Hillock, DO PCP - General Family Medicine  12/01/19   Jettie Booze, MD PCP - Cardiology Cardiology  01/26/20   Milus Banister, MD Attending Physician Gastroenterology  12/01/19     Chief Complaint  Patient presents with   Weight Loss    Needs to held accountable;      Subjective: Pt presents for an OV with complaints of weight gain and would like counseling on weight loss and exercise.  Start weight/date: 140 pounds/03/23/2021 Last weight/date: 140 pounds/03/23/2021 Today's weight: 140 pounds  Patient reports she has struggled to lose weight.  She had been a patient of a weight loss clinic in which she was placed on 900 cal a day and felt very fatigued and noted she was becoming sick more frequently.  Diet: A typical day in her diet would consist of yogurt, granola or rice cake with peanut butter and grape jelly for breakfast.  Lunch would include a salad with a protein, light dressing.  Dinner would include a baked protein and a vegetable.  She states she rarely eats fried foods.  For oils if used will use all of oil and vegetable oil combination because she does not like the taste of olive oil.  She eats tomatoes, zucchini, broccoli, squash, carrots, peppers, beets, green beans and corn.  She does have snacks between meals usually consisting of knots or fruit.  She states in the evening she does seem to want a snack more and has been craving chips. She drinks unsweetened tea during the day, tea with honey at night and does not drink much  water. She does eat sweet potatoes, avoids white potatoes typically. She does have rice cakes that are made with multigrain rice, otherwise does not consume rice. She rarely eats Post[place] or bread. Sometimes will crave something sweet and will eat a few chocolate chips.   Exercise: Patient reports she walks about 40 minutes / 2 miles about 5 times a week.  She states it is a brisk walk in which she is working up a sweat.  She is uncertain what her heart rate exercising gets up to.  She does not perform any other type of exercise.    Depression screen [facility] 2/9 11/28/2020 07/18/2020 12/03/2019  Decreased Interest 0 0 0  Down, Depressed, Hopeless 0 0 0  PHQ - 2 Score 0 0 0    Allergies  Allergen Reactions   Sulfamethoxazole-Trimethoprim Hives   Avelox [Moxifloxacin Hcl In Nacl]     Racing heart   Cephalexin Hives   Codeine Hives    Heart racing   Erythromycin Hives   Flagyl [Metronidazole]     Unknown reaction   Propranolol Hives   Sulfa Antibiotics Hives   Tetanus Toxoid Swelling    Reports a fever and headache with swelling.    Tramadol Hives   Statins Other (See Comments)    Myalgia    Social History   Social History Narrative   Marital status/children/pets: married, 2 children   Education/employment: HS grad. retired   Engineer, materials:      -smoke  alarm in the home:Yes     - wears seatbelt: Yes     - Feels safe in their relationships: Yes   Past Medical History:  Diagnosis Date   Allergic rhinitis    Allergic rhinitis due to pollen 12/03/2019   Anemia    Asthma    Chest pain, unspecified 04/07/2014   Chicken pox    Colon polyp    Diverticulosis    Dysphagia    Dysrhythmia    Palpitations   Fibromyalgia    Food allergy    Gallstone    GERD (gastroesophageal reflux disease)    Hiatal hernia    Hypercholesteremia    Hypertension    Hypothyroidism    IBS (irritable bowel syndrome)    Migraine headache    occasionally   Osteoarthritis    Osteopenia    Pneumonia     PONV (postoperative nausea and vomiting)    Pulmonary hypertension ([place])    pt denies, pt states she was tested for it but was not diagnosed with it   Rectal bleeding    SBO (small bowel obstruction) ([place]) 10/20/2017   Seasonal allergies    Thyroid nodule    Past Surgical History:  Procedure Laterality Date   APPENDECTOMY  1982   BREAST EXCISIONAL BIOPSY Right 1995   benign   BREAST LUMPECTOMY  1991   CARPAL TUNNEL RELEASE Right 2006   Right wrist   CESAREAN SECTION     x 2   CHOLECYSTECTOMY N/A 07/25/2020   Procedure: LAPAROSCOPIC CHOLECYSTECTOMY;  Surgeon: [name], MD;  Location: [facility];  Service: General;  Laterality: N/A;   COLON RESECTION  1990   COLONOSCOPY     [place] ([place] HX)  01/03/2020   IMAGE MRI brain:  04/2012   No focal IAC or inner ear lesion to explain hearing loss. Slightly greater than expected number of subcortical T2- hyperintensities bilaterally.  These are nonspecific.  They can be seen in the setting of chronic migraine headaches, demyelinating process, chronic microvascular ischemic, prior infection or inflammation, or vasculitis   IMAGE MRI lumbar:  05/01/2006   Mild central canal stenosis with facet arthropathy and mildly bulging disc at L4-5.  There is mild narrowing in the left lateral recess at this level.  No definite neural impingement.  Appearance at this level has not markedly Moderately severe facet arthropathy at L5-S1 with mild interval progression of a diffuse broad based disc bulge.  There is mild biforaminal narrowing.  Central canal is open   LAPAROSCOPIC ABDOMINAL EXPLORATION  2019   adhesion removal> caused bowel blockage    NASAL SEPTUM SURGERY  2004   TONSILLECTOMY  1965   TOTAL ABDOMINAL HYSTERECTOMY W/ BILATERAL SALPINGOOPHORECTOMY  1988   TUBAL LIGATION  1974   UPPER GASTROINTESTINAL ENDOSCOPY  2020   Family History  Problem Relation Age of Onset   Hyperlipidemia Father    Hypertension Father    Arthritis Father     Diabetes Father    Asthma Father    COPD Father    Early death Father    Parkinson's disease Father    Hypertension Mother    Hyperlipidemia Mother    Macular degeneration Mother    Arthritis Mother    Hearing loss Mother    Heart disease Mother    Kidney disease Mother    Throat cancer Brother        lung, and tongue   Arthritis Brother    COPD Brother  Diabetes type II Sister    COPD Sister    Heart disease Sister    Hypertension Sister    Thyroid cancer Sister    Lung cancer Sister    Early death Sister    COPD Sister    Breast cancer Maternal Aunt    Lung cancer Other        uncle   Emphysema Maternal Aunt    Emphysema Maternal Uncle    Clotting disorder Sister    Brain cancer Sister        brain tumor- not malignant   Breast cancer Cousin    Cancer Sister    Alcohol abuse Sister    COPD Sister    Early death Sister    Alcohol abuse Brother    Arthritis Brother    Depression Brother    Diabetes Brother    Hypercholesterolemia Brother    Colon cancer Neg Hx    Esophageal cancer Neg Hx    Rectal cancer Neg Hx    Stomach cancer Neg Hx    Allergies as of 03/23/2021       Reactions   Sulfamethoxazole-trimethoprim Hives   Avelox [moxifloxacin Hcl In Nacl]    Racing heart   Cephalexin Hives   Codeine Hives   Heart racing   Erythromycin Hives   Flagyl [metronidazole]    Unknown reaction   Propranolol Hives   Sulfa Antibiotics Hives   Tetanus Toxoid Swelling   Reports a fever and headache with swelling.    Tramadol Hives   Statins Other (See Comments)   Myalgia        Medication List        Accurate as of March 23, 2021 11:59 PM. If you have any questions, ask your nurse or doctor.          STOP taking these medications    diclofenac 75 MG EC tablet Commonly known as: VOLTAREN Stopped by: Howard Pouch, DO       TAKE these medications    albuterol 108 (90 Base) MCG/ACT inhaler Commonly known as: ProAir HFA Inhale 2 puffs into the  lungs every 6 (six) hours as needed.   Armour Thyroid 30 MG tablet Generic drug: thyroid Take 1 tablet by mouth in the AM once a day   Armour Thyroid 15 MG tablet Generic drug: thyroid Take 1 tablet by mouth in the AM Once a day   atenolol 25 MG tablet Commonly known as: TENORMIN Take 0.5-1 tablets (12.5-25 mg total) by mouth daily.   calcium carbonate 500 MG chewable tablet Commonly known as: TUMS - dosed in mg elemental calcium Chew 3 tablets by mouth daily as needed for indigestion or heartburn.   CoQ-10 100 MG Caps Take 100 mg by mouth daily.   ezetimibe 10 MG tablet Commonly known as: ZETIA Take 1 tablet (10 mg total) by mouth daily.   famotidine 20 MG tablet Commonly known as: PEPCID Take 1 tablet (20 mg total) by mouth daily.   fexofenadine 180 MG tablet Commonly known as: Allergy Relief Take 1 tablet (180 mg total) by mouth daily.   fluticasone 50 MCG/ACT nasal spray Commonly known as: FLONASE Place 2 sprays into both nostrils daily.   hydrochlorothiazide 12.5 MG capsule Commonly known as: MICROZIDE Take 1 capsule (12.5 mg total) by mouth daily as needed.   hyoscyamine 0.125 MG tablet Commonly known as: LEVSIN Take 1 tablet (0.125 mg total) by mouth every 6 (six) hours as needed.   levocetirizine 5 MG  tablet Commonly known as: XYZAL Take 1 tablet (5 mg total) by mouth every evening.   meloxicam 15 MG tablet Commonly known as: MOBIC Take 15 mg by mouth daily.   mometasone 0.1 % lotion Commonly known as: ELOCON Apply 1 application topically daily as needed (ear irritation).   montelukast 10 MG tablet Commonly known as: SINGULAIR TAKE 1 TABLET BY MOUTH EVERYDAY AT BEDTIME   mupirocin ointment 2 % Commonly known as: BACTROBAN Apply 1 application topically 2 (two) times daily.   ondansetron 8 MG disintegrating tablet Commonly known as: ZOFRAN-ODT Take 1 tablet (8 mg total) by mouth every 8 (eight) hours as needed for nausea or vomiting.    pantoprazole 40 MG tablet Commonly known as: PROTONIX Take 1 tablet (40 mg total) by mouth daily.   Probiotic Caps Take 1 capsule by mouth daily.   QC TUMERIC COMPLEX PO Take by mouth.   rosuvastatin 10 MG tablet Commonly known as: CRESTOR Take 1 tablet (10 mg total) by mouth 3 (three) times a week.   Symbicort 80-4.5 MCG/ACT inhaler Generic drug: budesonide-formoterol INHALE TWO PUFFS INTO THE LUNGS IN THE MORNING AND AT BEDTIME   THERATEARS OP Place 1 drop into both eyes 2 (two) times daily.   tiZANidine 4 MG tablet Commonly known as: ZANAFLEX Take 1 tablet (4 mg total) by mouth every 6 (six) hours as needed for muscle spasms.   VITAMIN A PO Take 1 tablet by mouth daily.   vitamin C 1000 MG tablet Take 3,000 mg by mouth daily.   Vitamin D 125 MCG (5000 UT) Caps Take 5,000 Units by mouth daily.   vitamin E 1000 UNIT capsule Take 1,000 Units by mouth daily.   zinc gluconate 50 MG tablet Take 100 mg by mouth daily.   zolpidem 5 MG tablet Commonly known as: AMBIEN Take 1 tablet (5 mg total) by mouth at bedtime. As needed        All past medical history, surgical history, allergies, family history, immunizations andmedications were updated in the EMR today and reviewed under the history and medication portions of their EMR.     ROS: Negative, with the exception of above mentioned in HPI   Objective:  BP 135/75   Pulse 61   Temp 98.7 F (37.1 C) (Oral)   Ht 4' 10.25" (1.48 m)   Wt 140 lb 9.6 oz (63.8 kg)   SpO2 97%   BMI 29.13 kg/m  Body mass index is 29.13 kg/m. Gen: Afebrile. No acute distress. Nontoxic in appearance, well developed, well nourished.  HENT: AT. [place].  Eyes:Pupils Equal Round Reactive to light, Extraocular movements intact,  Conjunctiva without redness, discharge or icterus..  Neuro:  Normal gait. PERLA. EOMi. Alert. Oriented x3  Psych: Normal affect, dress and demeanor. Normal speech. Normal thought content and judgment.  No results  found. No results found. No results found for this or any previous visit (from the past 24 hour(s)).  Assessment/Plan: Rita Levine is a 70 y.o. female present for OV for  Although she is eating a rather healthy diet, she is eating high carbohydrate diet.  She also is not drinking enough water.  She has not started to count her calories as of yet. Patient was counseled on exercise, calorie counting, weight loss. - elected to see how she does first 4 weeks on her own, prior to discussing potential medications to help with weight loss . -Patient was provided with online resources for: Weekly net calorie calculator.  Applications for  calorie counting.  Patient was advised to ensure she is taking in adequate nutrition daily by meeting calorie goals. -Patient was educated on dietary changes to not only lose weight but to eat healthy.   - Patient was educated on glycemic index.   -Diet to consist of protein, low glycemic vegetable and low glycemic fruits. -Patient was educated on exercise goal of 220 minutes a week (plus warm up and cool down) of cardiovascular exercise.  Patient was educated on heart rate for cardiovascular and fat burning zones (~100-120) -Patient was encouraged to maintain adequate water consumption of at least 80-100 ounces a day, more if exercising/sweating. F/u 4 weeks  Reviewed expectations re: course of current medical issues. Discussed self-management of symptoms. Outlined signs and symptoms indicating need for more acute intervention. Patient verbalized understanding and all questions were answered. Patient received an After-Visit Summary.  > 30 Minutes was dedicated to this patient's encounter today  No orders of the defined types were placed in this encounter.  No orders of the defined types were placed in this encounter.  Referral Orders  No referral(s) requested today     Note is dictated utilizing voice recognition software. Although note has been proof  read prior to signing, occasional typographical errors still can be missed. If any questions arise, please do not hesitate to call for verification.   electronically signed by:  Howard Pouch, DO  [place]

## 2021-03-26 DIAGNOSIS — Z713 Dietary counseling and surveillance: Secondary | ICD-10-CM | POA: Insufficient documentation

## 2021-04-06 ENCOUNTER — Ambulatory Visit: Payer: Medicare HMO | Admitting: Neurology

## 2021-04-20 ENCOUNTER — Telehealth: Payer: Self-pay

## 2021-04-20 ENCOUNTER — Other Ambulatory Visit: Payer: Self-pay

## 2021-04-20 ENCOUNTER — Ambulatory Visit: Payer: Medicare HMO | Admitting: Family Medicine

## 2021-04-20 MED ORDER — ALBUTEROL SULFATE (2.5 MG/3ML) 0.083% IN NEBU: 2.5000 mg | INHALATION_SOLUTION | Freq: Four times a day (QID) | RESPIRATORY_TRACT | 0 refills | Status: DC | PRN

## 2021-04-20 NOTE — Telephone Encounter (Signed)
Pt made aware refill sent. 

## 2021-04-20 NOTE — Telephone Encounter (Signed)
Please advise,   Pt needing albuterol solution. States she has some from 2019 but will need it renewed. Pt last seen 03/23/21.   albuterol (PROVENTIL) (2.5 MG/3ML) 0.083% nebulizer solution [[id number]]  DISCONTINUED

## 2021-04-20 NOTE — Telephone Encounter (Signed)
Refilled her nebulizer solution.

## 2021-04-20 NOTE — Telephone Encounter (Signed)
Please advise,   Pt is requesting to have new Rx for albuterol solution. She states that she has some from 2019 but is in need for new solution. Pt last seen on 03/23/21.  albuterol (PROVENTIL) (2.5 MG/3ML) 0.083% nebulizer solution [[id number]]  DISCONTINUED

## 2021-04-26 ENCOUNTER — Telehealth: Payer: Self-pay

## 2021-04-26 ENCOUNTER — Telehealth: Payer: Self-pay | Admitting: Family Medicine

## 2021-04-26 ENCOUNTER — Encounter: Payer: Self-pay | Admitting: Family Medicine

## 2021-04-26 ENCOUNTER — Telehealth (INDEPENDENT_AMBULATORY_CARE_PROVIDER_SITE_OTHER): Payer: Medicare HMO | Admitting: Family Medicine

## 2021-04-26 VITALS — BP 141/63 | HR 76 | Temp 97.1°F | Wt 137.0 lb

## 2021-04-26 DIAGNOSIS — U071 COVID-19: Secondary | ICD-10-CM | POA: Diagnosis not present

## 2021-04-26 DIAGNOSIS — R059 Cough, unspecified: Secondary | ICD-10-CM

## 2021-04-26 MED ORDER — NIRMATRELVIR/RITONAVIR (PAXLOVID)TABLET: 3.0000 | ORAL_TABLET | Freq: Two times a day (BID) | ORAL | 0 refills | Status: AC

## 2021-04-26 MED ORDER — BENZONATATE 200 MG PO CAPS: 200.0000 mg | ORAL_CAPSULE | Freq: Three times a day (TID) | ORAL | 0 refills | Status: DC | PRN

## 2021-04-26 MED ORDER — PREDNISONE 20 MG PO TABS: 20.0000 mg | ORAL_TABLET | Freq: Every day | ORAL | 0 refills | Status: DC

## 2021-04-26 NOTE — Telephone Encounter (Signed)
Pt had appt today  [facility] - Client TELEPHONE ADVICE RECORD [facility] Patient Name: [facility] Gender: Female DOB: [birth year] Age: 70 Y 11 M 10 D Return Phone Number: [phone] (Primary) Address: City/ State/ Zip: [place]  [address] Client [facility] - Client Client Site [place] - Day Physician Raoul Pitch, [place] Contact Type Call Who Is Calling Patient / Member / Family / Caregiver Call Type Triage / Clinical Relationship To Patient Self Return Phone Number [phone] (Primary) Chief Complaint Flu Symptom Reason for Call Symptomatic / Request for [place] states she has been experiencing COVID symptoms and she has been taking medication. Caller states she is still experiencing symptoms. Translation No Nurse Assessment Nurse: Velta Addison, RN, [place] Date/Time (Eastern Time): 04/26/2021 9:01:53 AM Confirm and document reason for call. If symptomatic, describe symptoms. ---Caller states she has been experiencing COVID symptoms and she has been taking medication. Caller states she is still experiencing symptoms. Symptoms started initially Friday. Chest tightness, cough productive, stuffy head, ha, throat tightening. Throat tightening was noticed upon waking up this morning. Does the patient have any new or worsening symptoms? ---Yes Will a triage be completed? ---Yes Related visit to physician within the last 2 weeks? ---No Does the PT have any chronic conditions? (i.e. diabetes, asthma, this includes High risk factors for pregnancy, etc.) ---Yes List chronic conditions. ---Asthma, HTN Is this a behavioral health or substance abuse call? ---No Guidelines Guideline Title Affirmed Question Affirmed Notes Nurse Date/Time (Eastern Time) COVID-19 - Diagnosed or Suspected MILD difficulty breathing (e.g., minimal/no SOB at rest, SOB with walking, pulse  <100) Parrott, RN, [place] 04/26/2021 9:04:18 AM PLEASE NOTE: All timestamps contained within this report are represented as [place] Standard Time. CONFIDENTIALTY NOTICE: This fax transmission is intended only for the addressee. It contains information that is legally privileged, confidential or otherwise protected from use or disclosure. If you are not the intended recipient, you are strictly prohibited from reviewing, disclosing, copying using or disseminating any of this information or taking any action in reliance on or regarding this information. If you have received this fax in error, please notify us immediately by telephone so that we can arrange for its return to [place]. Phone: [phone], Toll-Free: [phone], Fax: [phone] Page: 2 of 2 Call Id: [id number] [place]. Time Eilene Ghazi Time) Disposition Final User 04/26/2021 9:13:52 AM See HCP within 4 Hours (or PCP triage) Yes Velta Addison, RN, [place] Disagree/Comply Disagree Caller Understands Yes PreDisposition Call Doctor Care Advice Given Per Guideline SEE HCP (OR PCP TRIAGE) WITHIN 4 HOURS: * IF OFFICE WILL BE OPEN: You need to be seen within the next 3 or 4 hours. Call your doctor (or NP/PA) now or as soon as the office opens. CARE ADVICE given per COVID-19 - DIAGNOSED OR SUSPECTED (Adult) guideline. CALL BACK IF: * You become worse After Care Instructions Given Call Event Type User Date / Time Description Education document email Velta Addison, RN, [place] 04/26/2021 9:17:24 AM COVID-19 Diagnosed or Suspected Comments User: Hamilton Capri, RN Date/Time E[place] Ghazi Time): 04/26/2021 9:04:54 AM Caller states her BP has been fluctuating. Has been monitoring and following prescribed plan. User: Hamilton Capri, RN Date/Time (Eastern Time): 04/26/2021 9:07:10 AM 96% pulse ox; HR 76 User: Hamilton Capri, RN Date/Time (Eastern Time): 04/26/2021 9:16:45 AM Correction: When asked about chest tightness or pressure, she states it feels like  when her asthma flares up. States she took her rescue inhaler and a  benadryl due to the glands in her throat being swollen. When the office did not have any appts until tomorrow, she stated she preferred not to go to UC. Gave her the outcome but understood it was ultimately up to her. Advisd if any sx got worse to go to ER. Verbalized understanding. Referrals GO TO FACILITY REFUSED

## 2021-04-26 NOTE — Progress Notes (Signed)
VIRTUAL VISIT VIA VIDEO  I connected with Rita Levine on 04/26/21 at 11:30 AM EDT by elemedicine application and verified that I am speaking with the correct person using two identifiers. Location patient: Home Location provider: [facility], Office Persons participating in the virtual visit: Patient, Dr. Raoul Pitch and Darnell Level. Cesar, CMA  I discussed the limitations of evaluation and management by telemedicine and the availability of in person appointments. The patient expressed understanding and agreed to proceed.   SUBJECTIVE Chief Complaint  Patient presents with   Covid Positive    Pt c/o HA, chest tightness, neck pain reappeared today; COVID pos 7/9 at home;     HPI: Rita Levine is a 70 y.o. female presents today for COVID-positive illness.  Unfortunately, patient's husband was admitted to the hospital last night with COVID symptoms. Exposure:church exposure.  Sx start: Thursday.  Vaccine: Declined Pt endorses headache, chest tightness, neck discomfort, cough, sore throat. Pt denies fever, chills, shortness of breath, nausea or vomiting. Otc: Her normal antihistamine regimen GFR: Greater than 90 Anticoag: N/A  ROS: See pertinent positives and negatives per HPI.  Patient Active Problem List   Diagnosis Date Noted   Weight loss counseling, encounter for 03/26/2021   Coronary artery calcification of native artery 01/16/2021   Trigger finger of left and right ring fingers 01/11/2021   Head movements abnormal 10/16/2020   Tremor 10/16/2020   Ingrown toenail 08/23/2020   Acne 06/02/2020   Seborrheic dermatitis of scalp 06/02/2020   Aortic atherosclerosis ([place]) 02/03/2020   Multiple food allergies 12/03/2019   IBS (irritable bowel syndrome) 12/03/2019   DDD (degenerative disc disease), thoracolumbar 12/03/2019   Lumbar stenosis 12/03/2019   Overweight (BMI 25.0-29.9) 12/03/2019   Sleep disturbance 12/03/2019   Fibromyalgia    GERD (gastroesophageal reflux  disease)    Hypertension    Hypothyroidism    Osteoarthritis    Osteopenia    Thyroid nodule    Seasonal allergies    Palpitations 04/07/2014   Mixed hyperlipidemia 04/07/2014   Asthma, intrinsic 04/01/2011    Social History   Tobacco Use   Smoking status: Never   Smokeless tobacco: Never  Substance Use Topics   Alcohol use: Yes    Alcohol/week: 0.0 standard drinks    Comment: occasionally    Current Outpatient Medications:    albuterol (PROAIR HFA) 108 (90 Base) MCG/ACT inhaler, Inhale 2 puffs into the lungs every 6 (six) hours as needed., Disp: 6.7 g, Rfl: 5   albuterol (PROVENTIL) (2.5 MG/3ML) 0.083% nebulizer solution, Take 3 mLs (2.5 mg total) by nebulization every 6 (six) hours as needed., Disp: 75 mL, Rfl: 0   ARMOUR THYROID 15 MG tablet, Take 1 tablet by mouth in the AM Once a day, Disp: 90 tablet, Rfl: 3   ARMOUR THYROID 30 MG tablet, Take 1 tablet by mouth in the AM once a day, Disp: 90 tablet, Rfl: 3   Ascorbic Acid (VITAMIN C) 1000 MG tablet, Take 3,000 mg by mouth daily., Disp: , Rfl:    atenolol (TENORMIN) 25 MG tablet, Take 0.5-1 tablets (12.5-25 mg total) by mouth daily., Disp: 90 tablet, Rfl: 1   calcium carbonate (TUMS - DOSED IN MG ELEMENTAL CALCIUM) 500 MG chewable tablet, Chew 3 tablets by mouth daily as needed for indigestion or heartburn., Disp: , Rfl:    Carboxymethylcellulose Sodium (THERATEARS OP), Place 1 drop into both eyes 2 (two) times daily., Disp: , Rfl:    Cholecalciferol (VITAMIN D) 125 MCG (5000 UT)  CAPS, Take 5,000 Units by mouth daily. , Disp: , Rfl:    Coenzyme Q10 (COQ-10) 100 MG CAPS, Take 100 mg by mouth daily., Disp: , Rfl:    ezetimibe (ZETIA) 10 MG tablet, Take 1 tablet (10 mg total) by mouth daily., Disp: 90 tablet, Rfl: 3   famotidine (PEPCID) 20 MG tablet, Take 1 tablet (20 mg total) by mouth daily., Disp: 90 tablet, Rfl: 1   fexofenadine (ALLERGY RELIEF) 180 MG tablet, Take 1 tablet (180 mg total) by mouth daily., Disp: 90 tablet,  Rfl: 1   fluticasone (FLONASE) 50 MCG/ACT nasal spray, Place 2 sprays into both nostrils daily., Disp: 16 mL, Rfl: 11   hydrochlorothiazide (MICROZIDE) 12.5 MG capsule, Take 1 capsule (12.5 mg total) by mouth daily as needed., Disp: 45 capsule, Rfl: 1   hyoscyamine (LEVSIN) 0.125 MG tablet, Take 1 tablet (0.125 mg total) by mouth every 6 (six) hours as needed., Disp: 120 tablet, Rfl: 1   levocetirizine (XYZAL) 5 MG tablet, Take 1 tablet (5 mg total) by mouth every evening., Disp: 90 tablet, Rfl: 3   meloxicam (MOBIC) 15 MG tablet, Take 15 mg by mouth daily., Disp: , Rfl:    mometasone (ELOCON) 0.1 % lotion, Apply 1 application topically daily as needed (ear irritation). , Disp: , Rfl:    montelukast (SINGULAIR) 10 MG tablet, TAKE 1 TABLET BY MOUTH EVERYDAY AT BEDTIME, Disp: 90 tablet, Rfl: 1   mupirocin ointment (BACTROBAN) 2 %, Apply 1 application topically 2 (two) times daily., Disp: 30 g, Rfl: 2   nirmatrelvir/ritonavir EUA (PAXLOVID) TABS, Take 3 tablets by mouth 2 (two) times daily for 5 days. (Take nirmatrelvir 150 mg two tablets twice daily for 5 days and ritonavir 100 mg one tablet twice daily for 5 days) Patient GFR is 90, Disp: 30 tablet, Rfl: 0   ondansetron (ZOFRAN-ODT) 8 MG disintegrating tablet, Take 1 tablet (8 mg total) by mouth every 8 (eight) hours as needed for nausea or vomiting., Disp: 30 tablet, Rfl: 1   pantoprazole (PROTONIX) 40 MG tablet, Take 1 tablet (40 mg total) by mouth daily., Disp: 90 tablet, Rfl: 1   predniSONE (DELTASONE) 20 MG tablet, Take 1 tablet (20 mg total) by mouth daily with breakfast., Disp: 5 tablet, Rfl: 0   Probiotic CAPS, Take 1 capsule by mouth daily., Disp: , Rfl:    rosuvastatin (CRESTOR) 10 MG tablet, Take 1 tablet (10 mg total) by mouth 3 (three) times a week., Disp: 40 tablet, Rfl: 3   SYMBICORT 80-4.5 MCG/ACT inhaler, INHALE TWO PUFFS INTO THE LUNGS IN THE MORNING AND AT BEDTIME, Disp: 30.6 g, Rfl: 0   tiZANidine (ZANAFLEX) 4 MG tablet, Take 1  tablet (4 mg total) by mouth every 6 (six) hours as needed for muscle spasms., Disp: 120 tablet, Rfl: 5   Turmeric (QC TUMERIC COMPLEX PO), Take by mouth., Disp: , Rfl:    VITAMIN A PO, Take 1 tablet by mouth daily., Disp: , Rfl:    vitamin E 1000 UNIT capsule, Take 1,000 Units by mouth daily., Disp: , Rfl:    zinc gluconate 50 MG tablet, Take 100 mg by mouth daily., Disp: , Rfl:    zolpidem (AMBIEN) 5 MG tablet, Take 1 tablet (5 mg total) by mouth at bedtime. As needed, Disp: 90 tablet, Rfl: 1   benzonatate (TESSALON) 200 MG capsule, Take 1 capsule (200 mg total) by mouth 3 (three) times daily as needed., Disp: 30 capsule, Rfl: 0   folic acid (FOLVITE) 1 MG  tablet, SMARTSIG:2 Tablet(s) By Mouth, Disp: , Rfl:   Allergies  Allergen Reactions   Sulfamethoxazole-Trimethoprim Hives   Avelox [Moxifloxacin Hcl In Nacl]     Racing heart   Cephalexin Hives   Codeine Hives    Heart racing   Erythromycin Hives   Flagyl [Metronidazole]     Unknown reaction   Propranolol Hives   Sulfa Antibiotics Hives   Tetanus Toxoid Swelling    Reports a fever and headache with swelling.    Tramadol Hives   Statins Other (See Comments)    Myalgia     OBJECTIVE: BP (!) 141/63   Pulse 76   Temp (!) 97.1 F (36.2 C)   Wt 137 lb (62.1 kg)   SpO2 98%   BMI 28.39 kg/m  Gen: No acute distress. Nontoxic in appearance.  HENT: AT. [place].  MMM.  Eyes:Pupils Equal Round Reactive to light, Extraocular movements intact,  Conjunctiva without redness, discharge or icterus. Chest: Cough mild-present on exam today. Neuro: Normal gait. Alert. Oriented x3  Psych: Normal affect and demeanor. Normal speech. Normal thought content and judgment.  ASSESSMENT AND PLAN: Rita Levine is a 70 y.o. female present for  COVID-19/cough Rest, hydrate.  Continue allergy regimen Paxlovid prescribed, take until completed.  Prednisone burst Tessalon Perles for cough Reviewed home care instructions for COVID. Advised  self-isolation at home for at least 5 days. After 5 days, if improved and fever resolved, can be in public, but should wear a mask around others for an additional 5 days. If symptoms, esp, dyspnea develops/worsens, recommend in-person evaluation at either an urgent care or the emergency room.   Howard Pouch, DO 04/26/2021   Return in about 1 week (around 05/03/2021), or if symptoms worsen or fail to improve.  No orders of the defined types were placed in this encounter.  Meds ordered this encounter  Medications   nirmatrelvir/ritonavir EUA (PAXLOVID) TABS    Sig: Take 3 tablets by mouth 2 (two) times daily for 5 days. (Take nirmatrelvir 150 mg two tablets twice daily for 5 days and ritonavir 100 mg one tablet twice daily for 5 days) Patient GFR is 90    Dispense:  30 tablet    Refill:  0   predniSONE (DELTASONE) 20 MG tablet    Sig: Take 1 tablet (20 mg total) by mouth daily with breakfast.    Dispense:  5 tablet    Refill:  0   benzonatate (TESSALON) 200 MG capsule    Sig: Take 1 capsule (200 mg total) by mouth 3 (three) times daily as needed.    Dispense:  30 capsule    Refill:  0   Referral Orders  No referral(s) requested today

## 2021-04-26 NOTE — Telephone Encounter (Signed)
Patient called stating she has been Covid positive x 5 days with symptoms of "constricted" throat, chest tightness and cough. I transferred patient to triage nurse and advised her to follow instructions regarding when to be seen.

## 2021-04-26 NOTE — Telephone Encounter (Signed)
Spoke with ppt pt agreed to 1130 VV today

## 2021-04-26 NOTE — Patient Instructions (Signed)
10 Things You Can Do to Manage Your COVID-19 Symptoms at Home If you have possible or confirmed COVID-19 Stay home except to get medical care. Monitor your symptoms carefully. If your symptoms get worse, call your healthcare provider immediately. Get rest and stay hydrated. If you have a medical appointment, call the healthcare provider ahead of time and tell them that you have or may have COVID-19. For medical emergencies, call 911 and notify the dispatch personnel that you have or may have COVID-19. Cover your cough and sneezes with a tissue or use the inside of your elbow. Wash your hands often with soap and water for at least 20 seconds or clean your hands with an alcohol-based hand sanitizer that contains at least 60% alcohol. As much as possible, stay in a specific room and away from other people in your home. Also, you should use a separate bathroom, if available. If you need to be around other people in or outside of the home, wear a mask. Avoid sharing personal items with other people in your household, like dishes, towels, and bedding. Clean all surfaces that are touched often, like counters, tabletops, and doorknobs. Use household cleaning sprays or wipes according to the label instructions. cdc.gov/coronavirus 04/28/2020 This information is not intended to replace advice given to you by your health care provider. Make sure you discuss any questions you have with your healthcare provider. Document Revised: 11/17/2020 Document Reviewed: 11/17/2020 Elsevier Patient Education  2022 [employer]  

## 2021-04-27 ENCOUNTER — Encounter: Payer: Self-pay | Admitting: Family Medicine

## 2021-04-27 NOTE — Telephone Encounter (Signed)
Please advise on production of cough

## 2021-04-28 ENCOUNTER — Emergency Department (HOSPITAL_BASED_OUTPATIENT_CLINIC_OR_DEPARTMENT_OTHER)
Admission: EM | Admit: 2021-04-28 | Discharge: 2021-04-28 | Disposition: A | Discharge status: Home / Self Care | Payer: Medicare HMO | Attending: Emergency Medicine | Admitting: Emergency Medicine

## 2021-04-28 ENCOUNTER — Emergency Department (HOSPITAL_BASED_OUTPATIENT_CLINIC_OR_DEPARTMENT_OTHER): Payer: Medicare HMO

## 2021-04-28 ENCOUNTER — Other Ambulatory Visit: Payer: Self-pay

## 2021-04-28 DIAGNOSIS — J029 Acute pharyngitis, unspecified: Secondary | ICD-10-CM | POA: Diagnosis not present

## 2021-04-28 DIAGNOSIS — R0789 Other chest pain: Secondary | ICD-10-CM | POA: Diagnosis not present

## 2021-04-28 DIAGNOSIS — J45909 Unspecified asthma, uncomplicated: Secondary | ICD-10-CM | POA: Diagnosis not present

## 2021-04-28 DIAGNOSIS — M47812 Spondylosis without myelopathy or radiculopathy, cervical region: Secondary | ICD-10-CM | POA: Diagnosis not present

## 2021-04-28 DIAGNOSIS — Z79899 Other long term (current) drug therapy: Secondary | ICD-10-CM | POA: Insufficient documentation

## 2021-04-28 DIAGNOSIS — I10 Essential (primary) hypertension: Secondary | ICD-10-CM | POA: Diagnosis not present

## 2021-04-28 DIAGNOSIS — E039 Hypothyroidism, unspecified: Secondary | ICD-10-CM | POA: Insufficient documentation

## 2021-04-28 DIAGNOSIS — R1310 Dysphagia, unspecified: Secondary | ICD-10-CM | POA: Diagnosis not present

## 2021-04-28 DIAGNOSIS — U071 COVID-19: Secondary | ICD-10-CM | POA: Insufficient documentation

## 2021-04-28 LAB — COMPREHENSIVE METABOLIC PANEL
ALT: 36 U/L (ref 0–44)
AST: 48 U/L — ABNORMAL HIGH (ref 15–41)
Albumin: 4.2 g/dL (ref 3.5–5.0)
Alkaline Phosphatase: 89 U/L (ref 38–126)
Anion gap: 9 (ref 5–15)
BUN: 8 mg/dL (ref 8–23)
CO2: 23 mmol/L (ref 22–32)
Calcium: 9 mg/dL (ref 8.9–10.3)
Chloride: 102 mmol/L (ref 98–111)
Creatinine, Ser: 0.67 mg/dL (ref 0.44–1.00)
GFR, Estimated: >60 mL/min (ref >60)
Glucose, Bld: 148 mg/dL — ABNORMAL HIGH (ref 70–99)
Potassium: 4.3 mmol/L (ref 3.5–5.1)
Sodium: 134 mmol/L — ABNORMAL LOW (ref 135–145)
Total Bilirubin: 0.8 mg/dL (ref 0.3–1.2)
Total Protein: 7.6 g/dL (ref 6.5–8.1)

## 2021-04-28 LAB — CBC WITH DIFFERENTIAL/PLATELET
Abs Immature Granulocytes: 0.02 10*3/uL (ref 0.00–0.07)
Basophils Absolute: 0 10*3/uL (ref 0.0–0.1)
Basophils Relative: 0 %
Eosinophils Absolute: 0 10*3/uL (ref 0.0–0.5)
Eosinophils Relative: 0 %
HCT: 41.8 % (ref 36.0–46.0)
Hemoglobin: 14.2 g/dL (ref 12.0–15.0)
Immature Granulocytes: 0 %
Lymphocytes Relative: 17 %
Lymphs Abs: 0.8 10*3/uL (ref 0.7–4.0)
MCH: 28.3 pg (ref 26.0–34.0)
MCHC: 34 g/dL (ref 30.0–36.0)
MCV: 83.3 fL (ref 80.0–100.0)
Monocytes Absolute: 0.4 10*3/uL (ref 0.1–1.0)
Monocytes Relative: 8 %
Neutro Abs: 3.4 10*3/uL (ref 1.7–7.7)
Neutrophils Relative %: 75 %
Platelets: 247 10*3/uL (ref 150–400)
RBC: 5.02 MIL/uL (ref 3.87–5.11)
RDW: 13.9 % (ref 11.5–15.5)
WBC: 4.5 10*3/uL (ref 4.0–10.5)
nRBC: 0 % (ref 0.0–0.2)

## 2021-04-28 LAB — TROPONIN I (HIGH SENSITIVITY)
Troponin I (High Sensitivity): 18 ng/L — ABNORMAL HIGH (ref <18)
Troponin I (High Sensitivity): 21 ng/L — ABNORMAL HIGH (ref <18)

## 2021-04-28 MED ORDER — PREDNISONE 20 MG PO TABS: 20.0000 mg | ORAL_TABLET | Freq: Every day | ORAL | 0 refills | Status: DC

## 2021-04-28 NOTE — Discharge Instructions (Addendum)
X-rays today were clear.  The heart markers were mildly elevated but did not change.  Your EKG was normal.  It is hard to tell whether the discomfort you are having is related to COVID or if you are having some reflux related to the medications you are taking.  You may want to sleep slightly elevated to help if there is any silent reflux going on when you lay down.  If you start having any trouble breathing, inability to swallow return to the emergency room otherwise plan on following up with your doctor next week for recheck.

## 2021-04-28 NOTE — ED Triage Notes (Signed)
Patient diagnosed with covid last Saturday, has been taking Paxlovid for 2.5 days "seems to be helping" per patient. Now having mild chest tightness and feeling "throat tightness" trouble swallowing.  Denies pain.

## 2021-04-28 NOTE — ED Notes (Signed)
Patient transported to X-ray 

## 2021-04-28 NOTE — ED Provider Notes (Signed)
[facility] EMERGENCY DEPARTMENT Provider Note   CSN: [id number] Arrival date & time: 04/28/21  5364     History Chief Complaint  Patient presents with   Chest Pain   Sore Throat    Rita Levine is a 70 y.o. female.  Patient is a 70 year old female with a history of allergic rhinitis, asthma, GERD, hypertension, hypothyroidism who is presenting today with complaints of throat tightness and chest tightness.  Patient was diagnosed with COVID on Thursday and was started on PACs fluid the same day.  When she was doing her televisit with her symptoms at that time she was having the same symptoms.  The symptoms had improved and then last night they became severe again.  She feels like it is difficult to swallow and that her throat is tight but not particularly painful.  She was able to eat applesauce and drink some soup broth today while taking her medicines without difficulty.  She has not had significant shortness of breath but has been coughing and it is productive.  In addition to Paxil which she was also started on prednisone 10 mg daily.  She has not had any rashes itching or burning.  She has not had nausea or vomiting.  No abdominal pain.  Symptoms seem a little bit worse with lying down.  She has had some nasal congestion but no significant postnasal drip.  No change in voice.  The history is provided by the patient and medical records.  Chest Pain Sore Throat Associated symptoms include chest pain.      Past Medical History:  Diagnosis Date   Allergic rhinitis    Allergic rhinitis due to pollen 12/03/2019   Anemia    Asthma    Chest pain, unspecified 04/07/2014   Chicken pox    Colon polyp    Diverticulosis    Dysphagia    Dysrhythmia    Palpitations   Fibromyalgia    Food allergy    Gallstone    GERD (gastroesophageal reflux disease)    Hiatal hernia    Hypercholesteremia    Hypertension    Hypothyroidism    IBS (irritable bowel syndrome)    Migraine  headache    occasionally   Osteoarthritis    Osteopenia    Pneumonia    PONV (postoperative nausea and vomiting)    Pulmonary hypertension ([place])    pt denies, pt states she was tested for it but was not diagnosed with it   Rectal bleeding    SBO (small bowel obstruction) ([place]) 10/20/2017   Seasonal allergies    Thyroid nodule     Patient Active Problem List   Diagnosis Date Noted   Weight loss counseling, encounter for 03/26/2021   Coronary artery calcification of native artery 01/16/2021   Trigger finger of left and right ring fingers 01/11/2021   Head movements abnormal 10/16/2020   Tremor 10/16/2020   Ingrown toenail 08/23/2020   Acne 06/02/2020   Seborrheic dermatitis of scalp 06/02/2020   Aortic atherosclerosis ([place]) 02/03/2020   Multiple food allergies 12/03/2019   IBS (irritable bowel syndrome) 12/03/2019   DDD (degenerative disc disease), thoracolumbar 12/03/2019   Lumbar stenosis 12/03/2019   Overweight (BMI 25.0-29.9) 12/03/2019   Sleep disturbance 12/03/2019   Fibromyalgia    GERD (gastroesophageal reflux disease)    Hypertension    Hypothyroidism    Osteoarthritis    Osteopenia    Thyroid nodule    Seasonal allergies    Palpitations 04/07/2014   Mixed  hyperlipidemia 04/07/2014   Asthma, intrinsic 04/01/2011    Past Surgical History:  Procedure Laterality Date   APPENDECTOMY  1982   BREAST EXCISIONAL BIOPSY Right 1995   benign   BREAST LUMPECTOMY  1991   CARPAL TUNNEL RELEASE Right 2006   Right wrist   CESAREAN SECTION     x 2   CHOLECYSTECTOMY N/A 07/25/2020   Procedure: LAPAROSCOPIC CHOLECYSTECTOMY;  Surgeon: [name], MD;  Location: [facility];  Service: General;  Laterality: N/A;   [place] ([place] HX)  01/03/2020   IMAGE MRI brain:  04/2012   No focal IAC or inner ear lesion to explain hearing loss. Slightly greater than expected number of subcortical T2- hyperintensities bilaterally.  These are  nonspecific.  They can be seen in the setting of chronic migraine headaches, demyelinating process, chronic microvascular ischemic, prior infection or inflammation, or vasculitis   IMAGE MRI lumbar:  05/01/2006   Mild central canal stenosis with facet arthropathy and mildly bulging disc at L4-5.  There is mild narrowing in the left lateral recess at this level.  No definite neural impingement.  Appearance at this level has not markedly Moderately severe facet arthropathy at L5-S1 with mild interval progression of a diffuse broad based disc bulge.  There is mild biforaminal narrowing.  Central canal is open   LAPAROSCOPIC ABDOMINAL EXPLORATION  2019   adhesion removal> caused bowel blockage    NASAL SEPTUM SURGERY  2004   TONSILLECTOMY  1965   TOTAL ABDOMINAL HYSTERECTOMY W/ BILATERAL SALPINGOOPHORECTOMY  1988   TUBAL LIGATION  1974   UPPER GASTROINTESTINAL ENDOSCOPY  2020     OB History     Gravida  2   Para  2   Term      Preterm      AB      Living  2      SAB      IAB      Ectopic      Multiple      Live Births              Family History  Problem Relation Age of Onset   Hyperlipidemia Father    Hypertension Father    Arthritis Father    Diabetes Father    Asthma Father    COPD Father    Early death Father    Parkinson's disease Father    Hypertension Mother    Hyperlipidemia Mother    Macular degeneration Mother    Arthritis Mother    Hearing loss Mother    Heart disease Mother    Kidney disease Mother    Throat cancer Brother        lung, and tongue   Arthritis Brother    COPD Brother    Diabetes type II Sister    COPD Sister    Heart disease Sister    Hypertension Sister    Thyroid cancer Sister    Lung cancer Sister    Early death Sister    COPD Sister    Breast cancer Maternal Aunt    Lung cancer Other        uncle   Emphysema Maternal Aunt    Emphysema Maternal Uncle    Clotting disorder Sister    Brain cancer Sister        brain  tumor- not malignant   Breast cancer Cousin    Cancer Sister  Alcohol abuse Sister    COPD Sister    Early death Sister    Alcohol abuse Brother    Arthritis Brother    Depression Brother    Diabetes Brother    Hypercholesterolemia Brother    Colon cancer Neg Hx    Esophageal cancer Neg Hx    Rectal cancer Neg Hx    Stomach cancer Neg Hx     Social History   Tobacco Use   Smoking status: Never   Smokeless tobacco: Never  Vaping Use   Vaping Use: Never used  Substance Use Topics   Alcohol use: Yes    Alcohol/week: 0.0 standard drinks    Comment: occasionally   Drug use: No    Home Medications Prior to Admission medications   Medication Sig Start Date End Date Taking? Authorizing Provider  albuterol (PROAIR HFA) 108 (90 Base) MCG/ACT inhaler Inhale 2 puffs into the lungs every 6 (six) hours as needed. 12/01/19   Kuneff, Renee A, DO  albuterol (PROVENTIL) (2.5 MG/3ML) 0.083% nebulizer solution Take 3 mLs (2.5 mg total) by nebulization every 6 (six) hours as needed. 04/20/21   Kuneff, Renee A, DO  ARMOUR THYROID 15 MG tablet Take 1 tablet by mouth in the AM Once a day 11/30/20   Raoul Pitch, Renee A, DO  ARMOUR THYROID 30 MG tablet Take 1 tablet by mouth in the AM once a day 11/30/20   Kuneff, Renee A, DO  Ascorbic Acid (VITAMIN C) 1000 MG tablet Take 3,000 mg by mouth daily.    [provider]  atenolol (TENORMIN) 25 MG tablet Take 0.5-1 tablets (12.5-25 mg total) by mouth daily. 11/28/20   Kuneff, Renee A, DO  benzonatate (TESSALON) 200 MG capsule Take 1 capsule (200 mg total) by mouth 3 (three) times daily as needed. 04/26/21   Kuneff, Renee A, DO  calcium carbonate (TUMS - DOSED IN MG ELEMENTAL CALCIUM) 500 MG chewable tablet Chew 3 tablets by mouth daily as needed for indigestion or heartburn.    [provider]  Carboxymethylcellulose Sodium (THERATEARS OP) Place 1 drop into both eyes 2 (two) times daily.    [provider]  Cholecalciferol (VITAMIN D)  125 MCG (5000 UT) CAPS Take 5,000 Units by mouth daily.     [provider]  Coenzyme Q10 (COQ-10) 100 MG CAPS Take 100 mg by mouth daily.    [provider]  ezetimibe (ZETIA) 10 MG tablet Take 1 tablet (10 mg total) by mouth daily. 03/16/21   Jettie Booze, MD  famotidine (PEPCID) 20 MG tablet Take 1 tablet (20 mg total) by mouth daily. 11/28/20   Kuneff, Renee A, DO  fexofenadine (ALLERGY RELIEF) 180 MG tablet Take 1 tablet (180 mg total) by mouth daily. 01/03/21   Kuneff, Renee A, DO  fluticasone (FLONASE) 50 MCG/ACT nasal spray Place 2 sprays into both nostrils daily. 10/16/20   Kuneff, Renee A, DO  folic acid (FOLVITE) 1 MG tablet SMARTSIG:2 Tablet(s) By Mouth 04/19/21   [provider]  hydrochlorothiazide (MICROZIDE) 12.5 MG capsule Take 1 capsule (12.5 mg total) by mouth daily as needed. 11/28/20   Kuneff, Renee A, DO  hyoscyamine (LEVSIN) 0.125 MG tablet Take 1 tablet (0.125 mg total) by mouth every 6 (six) hours as needed. 11/28/20   Kuneff, Renee A, DO  levocetirizine (XYZAL) 5 MG tablet Take 1 tablet (5 mg total) by mouth every evening. 12/06/20   Kuneff, Renee A, DO  meloxicam (MOBIC) 15 MG tablet Take 15 mg  by mouth daily.    [provider]  mometasone (ELOCON) 0.1 % lotion Apply 1 application topically daily as needed (ear irritation).  07/09/20   [provider]  montelukast (SINGULAIR) 10 MG tablet TAKE 1 TABLET BY MOUTH EVERYDAY AT BEDTIME 11/28/20   Kuneff, Renee A, DO  mupirocin ointment (BACTROBAN) 2 % Apply 1 application topically 2 (two) times daily. 08/08/20   Trula Slade, DPM  nirmatrelvir/ritonavir EUA (PAXLOVID) TABS Take 3 tablets by mouth 2 (two) times daily for 5 days. (Take nirmatrelvir 150 mg two tablets twice daily for 5 days and ritonavir 100 mg one tablet twice daily for 5 days) Patient GFR is 90 04/26/21 05/01/21  Kuneff, Renee A, DO  ondansetron (ZOFRAN-ODT) 8 MG disintegrating tablet Take 1 tablet (8 mg total) by mouth  every 8 (eight) hours as needed for nausea or vomiting. 11/28/20   Kuneff, Renee A, DO  pantoprazole (PROTONIX) 40 MG tablet Take 1 tablet (40 mg total) by mouth daily. 11/28/20   Kuneff, Renee A, DO  predniSONE (DELTASONE) 20 MG tablet Take 1 tablet (20 mg total) by mouth daily with breakfast. 04/26/21   Kuneff, Renee A, DO  Probiotic CAPS Take 1 capsule by mouth daily.    [provider]  rosuvastatin (CRESTOR) 10 MG tablet Take 1 tablet (10 mg total) by mouth 3 (three) times a week. 02/26/21   Jettie Booze, MD  SYMBICORT 80-4.5 MCG/ACT inhaler INHALE TWO PUFFS INTO THE LUNGS IN THE MORNING AND AT BEDTIME 02/02/21   Kuneff, Renee A, DO  tiZANidine (ZANAFLEX) 4 MG tablet Take 1 tablet (4 mg total) by mouth every 6 (six) hours as needed for muscle spasms. 11/28/20   Kuneff, Renee A, DO  Turmeric (QC TUMERIC COMPLEX PO) Take by mouth.    [provider]  VITAMIN A PO Take 1 tablet by mouth daily.    [provider]  vitamin E 1000 UNIT capsule Take 1,000 Units by mouth daily.    [provider]  zinc gluconate 50 MG tablet Take 100 mg by mouth daily.    [provider]  zolpidem (AMBIEN) 5 MG tablet Take 1 tablet (5 mg total) by mouth at bedtime. As needed 11/28/20   Kuneff, Renee A, DO    Allergies    Sulfamethoxazole-trimethoprim, Avelox [moxifloxacin hcl in nacl], Cephalexin, Codeine, Erythromycin, Flagyl [metronidazole], Propranolol, Sulfa antibiotics, Tetanus toxoid, Tramadol, and Statins  Review of Systems   Review of Systems  Cardiovascular:  Positive for chest pain.  All other systems reviewed and are negative.  Physical Exam Updated Vital Signs BP (!) 181/91 (BP Location: Right Arm)   Pulse 93   Temp 98.8 F (37.1 C) (Oral)   Resp 20   SpO2 97%   Physical Exam Vitals and nursing note reviewed.  Constitutional:      General: She is not in acute distress.    Appearance: She is well-developed.  HENT:     Head: Normocephalic and  atraumatic.     Mouth/Throat:     Mouth: Mucous membranes are moist.     Comments: Mallampati of 1.  No significant pharyngeal edema, erythema or lesions Eyes:     Pupils: Pupils are equal, round, and reactive to light.  Neck:     Trachea: Trachea and phonation normal.     Comments: No stridor, hoarse voice Cardiovascular:     Rate and Rhythm: Normal rate and regular rhythm.     Heart sounds: Normal heart sounds. No  murmur heard.   No friction rub.  Pulmonary:     Effort: Pulmonary effort is normal.     Breath sounds: Normal breath sounds. No wheezing or rales.  Abdominal:     General: Bowel sounds are normal. There is no distension.     Palpations: Abdomen is soft.     Tenderness: There is no abdominal tenderness. There is no guarding or rebound.  Musculoskeletal:        General: No tenderness. Normal range of motion.     Cervical back: Normal range of motion and neck supple. No tenderness.     Right lower leg: No edema.     Left lower leg: No edema.     Comments: No edema  Skin:    General: Skin is warm and dry.     Findings: No rash.  Neurological:     Mental Status: She is alert and oriented to person, place, and time. Mental status is at baseline.     Cranial Nerves: No cranial nerve deficit.  Psychiatric:        Mood and Affect: Mood normal.        Behavior: Behavior normal.    ED Results / Procedures / Treatments   Labs (all labs ordered are listed, but only abnormal results are displayed) Labs Reviewed  COMPREHENSIVE METABOLIC PANEL - Abnormal; Notable for the following components:      Result Value   Sodium 134 (*)    Glucose, Bld 148 (*)    AST 48 (*)    All other components within normal limits  TROPONIN I (HIGH SENSITIVITY) - Abnormal; Notable for the following components:   Troponin I (High Sensitivity) 18 (*)    All other components within normal limits  TROPONIN I (HIGH SENSITIVITY) - Abnormal; Notable for the following components:   Troponin I (High  Sensitivity) 21 (*)    All other components within normal limits  CBC WITH DIFFERENTIAL/PLATELET    EKG EKG Interpretation  Date/Time:  Saturday April 28 2021 09:13:10 EDT Ventricular Rate:  90 PR Interval:  173 QRS Duration: 100 QT Interval:  360 QTC Calculation: 441 R Axis:   19 Text Interpretation: Sinus rhythm Probable left atrial enlargement Low voltage, precordial leads Confirmed by Blanchie Dessert [id number]) on 04/28/2021 9:26:30 AM  Radiology DG Neck Soft Tissue  Result Date: 04/28/2021 CLINICAL DATA:  COVID.  Chest tightness.  Three tight EXAM: NECK SOFT TISSUES - 1+ VIEW COMPARISON:  None. FINDINGS: Normal hypopharynx. Epiglottis is normal. Larynx is grossly normal. Proximal trachea is normal. No prevertebral swelling. Degenerative changes in the cervical spine. IMPRESSION: No soft tissue abnormality in the neck Electronically Signed   By: Suzy Bouchard M.D.   On: 04/28/2021 10:14   DG Chest 1 View  Result Date: 04/28/2021 CLINICAL DATA:  70 year old female diagnosed with COVID infection last Saturday presenting with mild chest tightness and throat tightness with difficulty swallowing. EXAM: CHEST  1 VIEW COMPARISON:  Chest x-ray 08/13/2018. FINDINGS: Lung volumes are normal. No consolidative airspace disease. No pleural effusions. No pneumothorax. No pulmonary nodule or mass noted. Pulmonary vasculature and the cardiomediastinal silhouette are within normal limits. IMPRESSION: No radiographic evidence of acute cardiopulmonary disease. Electronically Signed   By: Vinnie Langton M.D.   On: 04/28/2021 10:13    Procedures Procedures   Medications Ordered in ED Medications - No data to display  ED Course  I have reviewed the triage vital signs and the nursing notes.  Pertinent labs & imaging results  that were available during my care of the patient were reviewed by me and considered in my medical decision making (see chart for details).    MDM Rules/Calculators/A&P                           Patient who is known COVID-positive currently on Paxil event and prednisone presenting today with complaint of tightness in her chest and her throat.  She feels like it makes it difficult for her to swallow but she was able to drink soup broth and eat applesauce today while taking her medication.  She denies significant shortness of breath and no voice changes.  She is well-appearing on exam.  She is hypertensive here but oxygen saturation of 97%.  She does not have significant wheezing on exam with good air movement.  Symptoms could be related to GERD exacerbated by recently new medications, lower suspicion for allergic reaction, lower suspicion for epiglottitis, retropharyngeal abscess.  No evidence to suggest PTA.  Patient does not have significant tenderness with palpation of her neck.  Lower suspicion for ACS.  Will check chest x-ray, labs and soft tissue neck.  Patient's EKG without acute findings.  12:33 PM Patient's labs are reassuring.  Initial troponin of 18 and repeat of 21 which feels flat and most likely baseline.  Chest x-ray without acute findings and soft tissue neck also normal.  Patient is heart rate has been normal and blood pressure did improve to 149/83.  Low suspicion for PE at this time.  Patient does report that she has not been taking her muscle relaxer or anxiety medicine since taking the medication because she did not think they should be mixed.  Also her husband is hospitalized right now with COVID and she has been very nervous.  Difficult to discern whether patient's symptoms are COVID related versus medication related having additional reflux.  At this time she feels comfortable with discharge home.  She did want a car try a mildly increased prednisone dose that she is only on 10 mg.  We will increase her to 20 mg but instructed her to continue her PPIs and make sure she is eating small frequent meals.  MDM   Amount and/or Complexity of Data  Reviewed Clinical lab tests: ordered and reviewed Tests in the radiology section of CPT: ordered and reviewed Tests in the medicine section of CPT: ordered and reviewed Independent visualization of images, tracings, or specimens: yes     Final Clinical Impression(s) / ED Diagnoses Final diagnoses:  COVID  Sore throat  Other chest pain    Rx / DC Orders ED Discharge Orders          Ordered    predniSONE (DELTASONE) 20 MG tablet  Daily        04/28/21 1231             Blanchie Dessert, MD 04/28/21 1235

## 2021-05-07 ENCOUNTER — Encounter: Payer: Self-pay | Admitting: Family Medicine

## 2021-05-08 ENCOUNTER — Telehealth (INDEPENDENT_AMBULATORY_CARE_PROVIDER_SITE_OTHER): Payer: Medicare HMO | Admitting: Family Medicine

## 2021-05-08 ENCOUNTER — Encounter: Payer: Self-pay | Admitting: Family Medicine

## 2021-05-08 VITALS — BP 118/58 | HR 72 | Temp 97.5°F | Wt 135.0 lb

## 2021-05-08 DIAGNOSIS — J329 Chronic sinusitis, unspecified: Secondary | ICD-10-CM

## 2021-05-08 DIAGNOSIS — B9689 Other specified bacterial agents as the cause of diseases classified elsewhere: Secondary | ICD-10-CM

## 2021-05-08 DIAGNOSIS — U071 COVID-19: Secondary | ICD-10-CM

## 2021-05-08 MED ORDER — AMOXICILLIN-POT CLAVULANATE 875-125 MG PO TABS: 1.0000 | ORAL_TABLET | Freq: Two times a day (BID) | ORAL | 0 refills | Status: DC

## 2021-05-08 NOTE — Progress Notes (Signed)
VIRTUAL VISIT VIA VIDEO  I connected with Rita Levine on 05/08/21 at  2:30 PM EDT by elemedicine application and verified that I am speaking with the correct person using two identifiers. Location patient: Home Location provider: [facility], Office Persons participating in the virtual visit: Patient, Dr. Raoul Pitch and Darnell Level. Cesar, CMA  I discussed the limitations of evaluation and management by telemedicine and the availability of in person appointments. The patient expressed understanding and agreed to proceed.   SUBJECTIVE Chief Complaint  Patient presents with   Sinus Problem    Pt c/o sinus pressure/pain, ear pain, nasal drainage x 3 weeks worsen in the last 3 days    HPI: Rita Levine is 70 y.o. present for continued and changing symptoms after dx with covid 19. She was seen last week and started on paxlovid and prednisone. She then reported to the ED for worsening sx with normal cxr and vitals. Since that time she has progressed to sinus pressure and pain, nasal drainage- worse in the last 3 days.  She denies fever, chills or nausea.  Her husband is still admitted to the hospital for his COVID infection, but she states he is improving.  Prior note: Rita Levine is a 70 y.o. female presents today for COVID-positive illness.  Unfortunately, patient's husband was admitted to the hospital last night with COVID symptoms. Exposure:church exposure.  Sx start: Thursday.  Vaccine: Declined Pt endorses headache, chest tightness, neck discomfort, cough, sore throat. Pt denies fever, chills, shortness of breath, nausea or vomiting. Otc: Her normal antihistamine regimen GFR: Greater than 90 Anticoag: N/A ROS: See pertinent positives and negatives per HPI.  Patient Active Problem List   Diagnosis Date Noted   Weight loss counseling, encounter for 03/26/2021   Coronary artery calcification of native artery 01/16/2021   Trigger finger of left and right ring fingers 01/11/2021    Head movements abnormal 10/16/2020   Tremor 10/16/2020   Ingrown toenail 08/23/2020   Acne 06/02/2020   Seborrheic dermatitis of scalp 06/02/2020   Aortic atherosclerosis ([place]) 02/03/2020   Multiple food allergies 12/03/2019   IBS (irritable bowel syndrome) 12/03/2019   DDD (degenerative disc disease), thoracolumbar 12/03/2019   Lumbar stenosis 12/03/2019   Overweight (BMI 25.0-29.9) 12/03/2019   Sleep disturbance 12/03/2019   Fibromyalgia    GERD (gastroesophageal reflux disease)    Hypertension    Hypothyroidism    Osteoarthritis    Osteopenia    Thyroid nodule    Seasonal allergies    Palpitations 04/07/2014   Mixed hyperlipidemia 04/07/2014   Asthma, intrinsic 04/01/2011    Social History   Tobacco Use   Smoking status: Never   Smokeless tobacco: Never  Substance Use Topics   Alcohol use: Yes    Alcohol/week: 0.0 standard drinks    Comment: occasionally    Current Outpatient Medications:    albuterol (PROAIR HFA) 108 (90 Base) MCG/ACT inhaler, Inhale 2 puffs into the lungs every 6 (six) hours as needed., Disp: 6.7 g, Rfl: 5   albuterol (PROVENTIL) (2.5 MG/3ML) 0.083% nebulizer solution, Take 3 mLs (2.5 mg total) by nebulization every 6 (six) hours as needed., Disp: 75 mL, Rfl: 0   amoxicillin-clavulanate (AUGMENTIN) 875-125 MG tablet, Take 1 tablet by mouth 2 (two) times daily., Disp: 20 tablet, Rfl: 0   ARMOUR THYROID 15 MG tablet, Take 1 tablet by mouth in the AM Once a day, Disp: 90 tablet, Rfl: 3   ARMOUR THYROID 30 MG tablet, Take 1 tablet  by mouth in the AM once a day, Disp: 90 tablet, Rfl: 3   Ascorbic Acid (VITAMIN C) 1000 MG tablet, Take 3,000 mg by mouth daily., Disp: , Rfl:    atenolol (TENORMIN) 25 MG tablet, Take 0.5-1 tablets (12.5-25 mg total) by mouth daily., Disp: 90 tablet, Rfl: 1   benzonatate (TESSALON) 200 MG capsule, Take 1 capsule (200 mg total) by mouth 3 (three) times daily as needed., Disp: 30 capsule, Rfl: 0   calcium carbonate (TUMS -  DOSED IN MG ELEMENTAL CALCIUM) 500 MG chewable tablet, Chew 3 tablets by mouth daily as needed for indigestion or heartburn., Disp: , Rfl:    Carboxymethylcellulose Sodium (THERATEARS OP), Place 1 drop into both eyes 2 (two) times daily., Disp: , Rfl:    Cholecalciferol (VITAMIN D) 125 MCG (5000 UT) CAPS, Take 5,000 Units by mouth daily. , Disp: , Rfl:    Coenzyme Q10 (COQ-10) 100 MG CAPS, Take 100 mg by mouth daily., Disp: , Rfl:    ezetimibe (ZETIA) 10 MG tablet, Take 1 tablet (10 mg total) by mouth daily., Disp: 90 tablet, Rfl: 3   famotidine (PEPCID) 20 MG tablet, Take 1 tablet (20 mg total) by mouth daily., Disp: 90 tablet, Rfl: 1   fexofenadine (ALLERGY RELIEF) 180 MG tablet, Take 1 tablet (180 mg total) by mouth daily., Disp: 90 tablet, Rfl: 1   fluticasone (FLONASE) 50 MCG/ACT nasal spray, Place 2 sprays into both nostrils daily., Disp: 16 mL, Rfl: 11   folic acid (FOLVITE) 1 MG tablet, SMARTSIG:2 Tablet(s) By Mouth, Disp: , Rfl:    hydrochlorothiazide (MICROZIDE) 12.5 MG capsule, Take 1 capsule (12.5 mg total) by mouth daily as needed., Disp: 45 capsule, Rfl: 1   hyoscyamine (LEVSIN) 0.125 MG tablet, Take 1 tablet (0.125 mg total) by mouth every 6 (six) hours as needed., Disp: 120 tablet, Rfl: 1   levocetirizine (XYZAL) 5 MG tablet, Take 1 tablet (5 mg total) by mouth every evening., Disp: 90 tablet, Rfl: 3   meloxicam (MOBIC) 15 MG tablet, Take 15 mg by mouth daily., Disp: , Rfl:    mometasone (ELOCON) 0.1 % lotion, Apply 1 application topically daily as needed (ear irritation). , Disp: , Rfl:    montelukast (SINGULAIR) 10 MG tablet, TAKE 1 TABLET BY MOUTH EVERYDAY AT BEDTIME, Disp: 90 tablet, Rfl: 1   mupirocin ointment (BACTROBAN) 2 %, Apply 1 application topically 2 (two) times daily., Disp: 30 g, Rfl: 2   ondansetron (ZOFRAN-ODT) 8 MG disintegrating tablet, Take 1 tablet (8 mg total) by mouth every 8 (eight) hours as needed for nausea or vomiting., Disp: 30 tablet, Rfl: 1    pantoprazole (PROTONIX) 40 MG tablet, Take 1 tablet (40 mg total) by mouth daily., Disp: 90 tablet, Rfl: 1   Probiotic CAPS, Take 1 capsule by mouth daily., Disp: , Rfl:    rosuvastatin (CRESTOR) 10 MG tablet, Take 1 tablet (10 mg total) by mouth 3 (three) times a week., Disp: 40 tablet, Rfl: 3   SYMBICORT 80-4.5 MCG/ACT inhaler, INHALE TWO PUFFS INTO THE LUNGS IN THE MORNING AND AT BEDTIME, Disp: 30.6 g, Rfl: 0   tiZANidine (ZANAFLEX) 4 MG tablet, Take 1 tablet (4 mg total) by mouth every 6 (six) hours as needed for muscle spasms., Disp: 120 tablet, Rfl: 5   Turmeric (QC TUMERIC COMPLEX PO), Take by mouth., Disp: , Rfl:    VITAMIN A PO, Take 1 tablet by mouth daily., Disp: , Rfl:    vitamin E 1000 UNIT capsule, Take  1,000 Units by mouth daily., Disp: , Rfl:    zinc gluconate 50 MG tablet, Take 100 mg by mouth daily., Disp: , Rfl:    zolpidem (AMBIEN) 5 MG tablet, Take 1 tablet (5 mg total) by mouth at bedtime. As needed, Disp: 90 tablet, Rfl: 1   predniSONE (DELTASONE) 20 MG tablet, Take 1 tablet (20 mg total) by mouth daily with breakfast. (Patient not taking: Reported on 05/08/2021), Disp: 5 tablet, Rfl: 0   predniSONE (DELTASONE) 20 MG tablet, Take 1 tablet (20 mg total) by mouth daily. (Patient not taking: Reported on 05/08/2021), Disp: 4 tablet, Rfl: 0  Allergies  Allergen Reactions   Sulfamethoxazole-Trimethoprim Hives   Avelox [Moxifloxacin Hcl In Nacl]     Racing heart   Cephalexin Hives   Codeine Hives    Heart racing   Erythromycin Hives   Flagyl [Metronidazole]     Unknown reaction   Propranolol Hives   Sulfa Antibiotics Hives   Tetanus Toxoid Swelling    Reports a fever and headache with swelling.    Tramadol Hives   Statins Other (See Comments)    Myalgia     OBJECTIVE: BP (!) 118/58   Pulse 72   Temp (!) 97.5 F (36.4 C)   Wt 135 lb (61.2 kg)   BMI 27.97 kg/m  Gen: No acute distress. Nontoxic in appearance.  HENT: AT. [place].  MMM.  Eyes:Pupils Equal Round  Reactive to light, Extraocular movements intact,  Conjunctiva without redness, discharge or icterus. Chest: Cough present- but improving.  Skin: no rashes, purpura or petechiae.  Neuro:  Alert. Oriented x3  Psych: Normal affect and demeanor. Normal speech. Normal thought content and judgment.  ASSESSMENT AND PLAN: Rita Levine is a 70 y.o. female present for  COVID-19/Bacterial sinusitis Rest, hydrate.  +/- flonase, mucinex , nettie pot or nasal saline.  Augmentin prescribed, take until completed.   F/U 2 weeks if not improved.     Howard Pouch, DO 05/08/2021   No follow-ups on file.  No orders of the defined types were placed in this encounter.  Meds ordered this encounter  Medications   amoxicillin-clavulanate (AUGMENTIN) 875-125 MG tablet    Sig: Take 1 tablet by mouth 2 (two) times daily.    Dispense:  20 tablet    Refill:  0    Referral Orders  No referral(s) requested today

## 2021-05-09 ENCOUNTER — Encounter: Payer: Self-pay | Admitting: Family Medicine

## 2021-05-14 ENCOUNTER — Other Ambulatory Visit: Payer: Self-pay

## 2021-05-14 ENCOUNTER — Encounter: Payer: Self-pay | Admitting: Family Medicine

## 2021-05-14 ENCOUNTER — Ambulatory Visit (INDEPENDENT_AMBULATORY_CARE_PROVIDER_SITE_OTHER): Payer: Medicare HMO | Admitting: Family Medicine

## 2021-05-14 VITALS — BP 129/64 | HR 71 | Temp 98.4°F | Ht <= 58 in | Wt 136.0 lb

## 2021-05-14 DIAGNOSIS — M5135 Other intervertebral disc degeneration, thoracolumbar region: Secondary | ICD-10-CM | POA: Diagnosis not present

## 2021-05-14 DIAGNOSIS — I2510 Atherosclerotic heart disease of native coronary artery without angina pectoris: Secondary | ICD-10-CM

## 2021-05-14 DIAGNOSIS — E782 Mixed hyperlipidemia: Secondary | ICD-10-CM | POA: Diagnosis not present

## 2021-05-14 DIAGNOSIS — I10 Essential (primary) hypertension: Secondary | ICD-10-CM

## 2021-05-14 DIAGNOSIS — K588 Other irritable bowel syndrome: Secondary | ICD-10-CM | POA: Diagnosis not present

## 2021-05-14 DIAGNOSIS — R002 Palpitations: Secondary | ICD-10-CM | POA: Diagnosis not present

## 2021-05-14 DIAGNOSIS — J45909 Unspecified asthma, uncomplicated: Secondary | ICD-10-CM

## 2021-05-14 DIAGNOSIS — I2584 Coronary atherosclerosis due to calcified coronary lesion: Secondary | ICD-10-CM

## 2021-05-14 DIAGNOSIS — M797 Fibromyalgia: Secondary | ICD-10-CM

## 2021-05-14 DIAGNOSIS — E039 Hypothyroidism, unspecified: Secondary | ICD-10-CM | POA: Diagnosis not present

## 2021-05-14 DIAGNOSIS — K219 Gastro-esophageal reflux disease without esophagitis: Secondary | ICD-10-CM

## 2021-05-14 DIAGNOSIS — E663 Overweight: Secondary | ICD-10-CM

## 2021-05-14 DIAGNOSIS — I700 Atherosclerosis of aorta: Secondary | ICD-10-CM

## 2021-05-14 MED ORDER — SYMBICORT 80-4.5 MCG/ACT IN AERO: INHALATION_SPRAY | RESPIRATORY_TRACT | 11 refills | Status: DC

## 2021-05-14 MED ORDER — HYDROCHLOROTHIAZIDE 12.5 MG PO CAPS: 12.5000 mg | ORAL_CAPSULE | Freq: Every day | ORAL | 1 refills | Status: DC | PRN

## 2021-05-14 MED ORDER — ZOLPIDEM TARTRATE 5 MG PO TABS: 5.0000 mg | ORAL_TABLET | Freq: Every day | ORAL | 1 refills | Status: DC

## 2021-05-14 MED ORDER — MELOXICAM 15 MG PO TABS: 15.0000 mg | ORAL_TABLET | Freq: Every day | ORAL | 1 refills | Status: DC

## 2021-05-14 MED ORDER — ALBUTEROL SULFATE (2.5 MG/3ML) 0.083% IN NEBU: 2.5000 mg | INHALATION_SOLUTION | Freq: Four times a day (QID) | RESPIRATORY_TRACT | 0 refills | Status: DC | PRN

## 2021-05-14 MED ORDER — FAMOTIDINE 20 MG PO TABS: 20.0000 mg | ORAL_TABLET | Freq: Every day | ORAL | 1 refills | Status: DC

## 2021-05-14 MED ORDER — ARMOUR THYROID 30 MG PO TABS: ORAL_TABLET | ORAL | 3 refills | Status: DC

## 2021-05-14 MED ORDER — PANTOPRAZOLE SODIUM 40 MG PO TBEC: 40.0000 mg | DELAYED_RELEASE_TABLET | Freq: Every day | ORAL | 1 refills | Status: DC

## 2021-05-14 MED ORDER — ATENOLOL 25 MG PO TABS: 12.5000 mg | ORAL_TABLET | Freq: Every day | ORAL | 1 refills | Status: DC

## 2021-05-14 MED ORDER — HYOSCYAMINE SULFATE 0.125 MG PO TABS: 0.1250 mg | ORAL_TABLET | Freq: Four times a day (QID) | ORAL | 5 refills | Status: DC | PRN

## 2021-05-14 MED ORDER — EZETIMIBE 10 MG PO TABS: 10.0000 mg | ORAL_TABLET | Freq: Every day | ORAL | 3 refills | Status: DC

## 2021-05-14 MED ORDER — TIZANIDINE HCL 4 MG PO TABS: 4.0000 mg | ORAL_TABLET | Freq: Four times a day (QID) | ORAL | 5 refills | Status: DC | PRN

## 2021-05-14 MED ORDER — ARMOUR THYROID 15 MG PO TABS: ORAL_TABLET | ORAL | 3 refills | Status: DC

## 2021-05-14 MED ORDER — MONTELUKAST SODIUM 10 MG PO TABS: ORAL_TABLET | ORAL | 1 refills | Status: DC

## 2021-05-14 MED ORDER — ALBUTEROL SULFATE HFA 108 (90 BASE) MCG/ACT IN AERS: 2.0000 | INHALATION_SPRAY | Freq: Four times a day (QID) | RESPIRATORY_TRACT | 5 refills | Status: DC | PRN

## 2021-05-14 NOTE — Progress Notes (Signed)
This visit occurred during the SARS-CoV-2 public health emergency.  Safety protocols were in place, including screening questions prior to the visit, additional usage of staff PPE, and extensive cleaning of exam room while observing appropriate contact time as indicated for disinfecting solutions.    Patient ID: Rita Levine, female  DOB: Jan 25, 1951, 69 y.o.   MRN: [id number] Patient Care Team    Relationship Specialty Notifications Start End  Ma Hillock, DO PCP - General Family Medicine  12/01/19   Jettie Booze, MD PCP - Cardiology Cardiology  01/26/20   Milus Banister, MD Attending Physician Gastroenterology  12/01/19     Chief Complaint  Patient presents with   Anxiety    [facility];     Subjective:  Rita Levine is a 70 y.o.  Female  present for cmc. All past medical history, surgical history, allergies, family history, immunizations, medications and social history were updated in the electronic medical record today. All recent labs, ED visits and hospitalizations within the last year were reviewed.  Gastroesophageal reflux disease, unspecified whether esophagitis present Patient reports symptoms are still well controlled on omeprazole and Pepcid.   Essential hypertension/HLD/Statin declined/Palpitations/Obesity (BMI 30-39.9) Pt reports compliance  with atenolol use of Maxide or losartan half tab if needed only.  Blood pressures ranges at home within normal limits. Patient denies chest pain, shortness of breath, dizziness or lower extremity edema.  Pt does not daily baby ASA. Pt is not prescribed statin. Labs up-to-date 07/2019 at prior PCP   Fibromyalgia/osteoarthritis, unspecified osteoarthritis type, unspecified site/ DDD (degenerative disc disease), lumbar/Spinal stenosis of lumbar region, unspecified whether neurogenic claudication present Patient reports her fibromyalgia and arthritis symptoms are well controlled on on Mobic and Zanaflex.   Seasonal  allergies/Allergic rhinitis due to pollen, unspecified seasonality/ Multiple food allergies She reports allergies are stable on Xyzal nightly, Singulair nightly and Allegra as needed in the day.  Prior note: Patient reports her allergies have always been rather uncontrolled.  When she lived in a different state she had food allergy testing and was allergic to many foods.  She at one time had allergy shots and this is when her allergies were the best as far as control.  She reports frequent occurrence of sinus infections and sinus headaches.  She had an MRI in 2013.  Her current regimen consist of Xyzal, Allegra, Singulair and Flonase.  She has taking Zyrtec in the past.  She reports the Xyzal was added last year but she does not feel its been helpful.   irritable bowel syndrome She reports an extensive history of diverticulitis requiring colon resection and later exploratory lap of the abdomen with removal of adhesions for short bowel obstruction.  SHe has continued irritable bowel symptoms are well controlled on Levsin as needed.  She is established with gastroenterology.  Last colonoscopy 2017, with Dr. Ardis Hughs   Asthma, intrinsic Patient reports her asthma is still well controlled on Symbicort.  She rarely needs to use her albuterol inhaler.   Hypothyroidism, unspecified type/Thyroid nodule She reports compliance with Armour 45 mg total dose.  Labs are up-to-date Prior note: Thyroid nodule history.  Last ultrasound 08/17/2018 with left thyroid nodule.  Per radiology report 1 year follow-up was recommended.  Patient does endorse mild compression-like symptoms.  She states her prior PCP had ordered follow-up, but it was canceled secondary to Covid pandemic.  Patient had thyroid ultrasound completed at [facility] radiology in [place].  Depression screen [facility] 2/9 11/28/2020 07/18/2020 12/03/2019  Decreased  Interest 0 0 0  Down, Depressed, Hopeless 0 0 0  PHQ - 2 Score 0 0 0   No flowsheet data  found.   Immunization History  Administered Date(s) Administered   Influenza-Unspecified 06/11/2019, 07/14/2020   Pneumococcal Conjugate-13 07/03/2016   Pneumococcal Polysaccharide-23 08/03/2019   Tdap 05/02/2014   Zoster, Live 10/14/2010    Past Medical History:  Diagnosis Date   Allergic rhinitis    Allergic rhinitis due to pollen 12/03/2019   Anemia    Asthma    Chest pain, unspecified 04/07/2014   Chicken pox    Colon polyp    Diverticulosis    Dysphagia    Dysrhythmia    Palpitations   Fibromyalgia    Food allergy    Gallstone    GERD (gastroesophageal reflux disease)    Hiatal hernia    Hypercholesteremia    Hypertension    Hypothyroidism    IBS (irritable bowel syndrome)    Migraine headache    occasionally   Osteoarthritis    Osteopenia    Pneumonia    PONV (postoperative nausea and vomiting)    Pulmonary hypertension ([place])    pt denies, pt states she was tested for it but was not diagnosed with it   Rectal bleeding    SBO (small bowel obstruction) ([place]) 10/20/2017   Seasonal allergies    Thyroid nodule    Allergies  Allergen Reactions   Sulfamethoxazole-Trimethoprim Hives   Avelox [Moxifloxacin Hcl In Nacl]     Racing heart   Cephalexin Hives   Codeine Hives    Heart racing   Erythromycin Hives   Flagyl [Metronidazole]     Unknown reaction   Propranolol Hives   Sulfa Antibiotics Hives   Tetanus Toxoid Swelling    Reports a fever and headache with swelling.    Tramadol Hives   Statins Other (See Comments)    Myalgia    Past Surgical History:  Procedure Laterality Date   APPENDECTOMY  1982   BREAST EXCISIONAL BIOPSY Right 1995   benign   BREAST LUMPECTOMY  1991   CARPAL TUNNEL RELEASE Right 2006   Right wrist   CESAREAN SECTION     x 2   CHOLECYSTECTOMY N/A 07/25/2020   Procedure: LAPAROSCOPIC CHOLECYSTECTOMY;  Surgeon: [name], MD;  Location: [place];  Service: General;  Laterality: N/A;   [place] ([place] HX)  01/03/2020   IMAGE MRI brain:  04/2012   No focal IAC or inner ear lesion to explain hearing loss. Slightly greater than expected number of subcortical T2- hyperintensities bilaterally.  These are nonspecific.  They can be seen in the setting of chronic migraine headaches, demyelinating process, chronic microvascular ischemic, prior infection or inflammation, or vasculitis   IMAGE MRI lumbar:  05/01/2006   Mild central canal stenosis with facet arthropathy and mildly bulging disc at L4-5.  There is mild narrowing in the left lateral recess at this level.  No definite neural impingement.  Appearance at this level has not markedly Moderately severe facet arthropathy at L5-S1 with mild interval progression of a diffuse broad based disc bulge.  There is mild biforaminal narrowing.  Central canal is open   LAPAROSCOPIC ABDOMINAL EXPLORATION  2019   adhesion removal> caused bowel blockage    NASAL SEPTUM SURGERY  2004   TONSILLECTOMY  1965   TOTAL ABDOMINAL HYSTERECTOMY W/ BILATERAL SALPINGOOPHORECTOMY  1988   TUBAL LIGATION  [place] ENDOSCOPY  2020   Family History  Problem Relation Age of Onset   Hyperlipidemia Father    Hypertension Father    Arthritis Father    Diabetes Father    Asthma Father    COPD Father    Early death Father    Parkinson's disease Father    Hypertension Mother    Hyperlipidemia Mother    Macular degeneration Mother    Arthritis Mother    Hearing loss Mother    Heart disease Mother    Kidney disease Mother    Throat cancer Brother        lung, and tongue   Arthritis Brother    COPD Brother    Diabetes type II Sister    COPD Sister    Heart disease Sister    Hypertension Sister    Thyroid cancer Sister    Lung cancer Sister    Early death Sister    COPD Sister    Breast cancer Maternal Aunt    Lung cancer Other        uncle   Emphysema Maternal Aunt    Emphysema Maternal Uncle    Clotting disorder  Sister    Brain cancer Sister        brain tumor- not malignant   Breast cancer Cousin    Cancer Sister    Alcohol abuse Sister    COPD Sister    Early death Sister    Alcohol abuse Brother    Arthritis Brother    Depression Brother    Diabetes Brother    Hypercholesterolemia Brother    Colon cancer Neg Hx    Esophageal cancer Neg Hx    Rectal cancer Neg Hx    Stomach cancer Neg Hx    Social History   Social History Narrative   Marital status/children/pets: married, 2 children   Education/employment: HS grad. retired   Engineer, materials:      -smoke alarm in the home:Yes     - wears seatbelt: Yes     - Feels safe in their relationships: Yes    Allergies as of 05/14/2021       Reactions   Sulfamethoxazole-trimethoprim Hives   Avelox [moxifloxacin Hcl In Nacl]    Racing heart   Cephalexin Hives   Codeine Hives   Heart racing   Erythromycin Hives   Flagyl [metronidazole]    Unknown reaction   Propranolol Hives   Sulfa Antibiotics Hives   Tetanus Toxoid Swelling   Reports a fever and headache with swelling.    Tramadol Hives   Statins Other (See Comments)   Myalgia        Medication List        Accurate as of May 14, 2021  1:57 PM. If you have any questions, ask your nurse or doctor.          STOP taking these medications    amoxicillin-clavulanate 875-125 MG tablet Commonly known as: AUGMENTIN Stopped by: Howard Pouch, DO   benzonatate 200 MG capsule Commonly known as: TESSALON Stopped by: Howard Pouch, DO   predniSONE 20 MG tablet Commonly known as: DELTASONE Stopped by: Howard Pouch, DO       TAKE these medications    albuterol 108 (90 Base) MCG/ACT inhaler Commonly known as: ProAir HFA Inhale 2 puffs into the lungs every 6 (six) hours as needed.   albuterol (2.5 MG/3ML) 0.083% nebulizer solution Commonly known as: PROVENTIL Take 3 mLs (2.5 mg total)  by nebulization every 6 (six) hours as needed.   Armour Thyroid 15 MG tablet Generic drug:  thyroid Take 1 tablet by mouth in the AM Once a day   Armour Thyroid 30 MG tablet Generic drug: thyroid Take 1 tablet by mouth in the AM once a day   atenolol 25 MG tablet Commonly known as: TENORMIN Take 0.5-1 tablets (12.5-25 mg total) by mouth daily.   calcium carbonate 500 MG chewable tablet Commonly known as: TUMS - dosed in mg elemental calcium Chew 3 tablets by mouth daily as needed for indigestion or heartburn.   CoQ-10 100 MG Caps Take 100 mg by mouth daily.   ezetimibe 10 MG tablet Commonly known as: ZETIA Take 1 tablet (10 mg total) by mouth daily.   famotidine 20 MG tablet Commonly known as: PEPCID Take 1 tablet (20 mg total) by mouth daily.   fexofenadine 180 MG tablet Commonly known as: Allergy Relief Take 1 tablet (180 mg total) by mouth daily.   fluticasone 50 MCG/ACT nasal spray Commonly known as: FLONASE Place 2 sprays into both nostrils daily.   folic acid 1 MG tablet Commonly known as: FOLVITE SMARTSIG:2 Tablet(s) By Mouth   hydrochlorothiazide 12.5 MG capsule Commonly known as: MICROZIDE Take 1 capsule (12.5 mg total) by mouth daily as needed.   hyoscyamine 0.125 MG tablet Commonly known as: LEVSIN Take 1 tablet (0.125 mg total) by mouth every 6 (six) hours as needed.   levocetirizine 5 MG tablet Commonly known as: XYZAL Take 1 tablet (5 mg total) by mouth every evening.   meloxicam 15 MG tablet Commonly known as: MOBIC Take 15 mg by mouth daily.   mometasone 0.1 % lotion Commonly known as: ELOCON Apply 1 application topically daily as needed (ear irritation).   montelukast 10 MG tablet Commonly known as: SINGULAIR TAKE 1 TABLET BY MOUTH EVERYDAY AT BEDTIME   mupirocin ointment 2 % Commonly known as: BACTROBAN Apply 1 application topically 2 (two) times daily.   ondansetron 8 MG disintegrating tablet Commonly known as: ZOFRAN-ODT Take 1 tablet (8 mg total) by mouth every 8 (eight) hours as needed for nausea or vomiting.    pantoprazole 40 MG tablet Commonly known as: PROTONIX Take 1 tablet (40 mg total) by mouth daily.   Probiotic Caps Take 1 capsule by mouth daily.   QC TUMERIC COMPLEX PO Take by mouth.   rosuvastatin 10 MG tablet Commonly known as: CRESTOR Take 1 tablet (10 mg total) by mouth 3 (three) times a week.   Symbicort 80-4.5 MCG/ACT inhaler Generic drug: budesonide-formoterol INHALE TWO PUFFS INTO THE LUNGS IN THE MORNING AND AT BEDTIME   THERATEARS OP Place 1 drop into both eyes 2 (two) times daily.   tiZANidine 4 MG tablet Commonly known as: ZANAFLEX Take 1 tablet (4 mg total) by mouth every 6 (six) hours as needed for muscle spasms.   VITAMIN A PO Take 1 tablet by mouth daily.   vitamin C 1000 MG tablet Take 3,000 mg by mouth daily.   Vitamin D 125 MCG (5000 UT) Caps Take 5,000 Units by mouth daily.   vitamin E 1000 UNIT capsule Take 1,000 Units by mouth daily.   zinc gluconate 50 MG tablet Take 100 mg by mouth daily.   zolpidem 5 MG tablet Commonly known as: AMBIEN Take 1 tablet (5 mg total) by mouth at bedtime. As needed        All past medical history, surgical history, allergies, family history, immunizations andmedications were updated in  the EMR today and reviewed under the history and medication portions of their EMR.      MM 3D SCREEN BREAST BILATERAL Result Date: 09/29/2020  BI-RADS CATEGORY  1: Negative. Electronically Signed   By: Lajean Manes M.D.   On: 09/29/2020 11:27   LONG TERM MONITOR (3-14 DAYS)  Result Date: 09/29/2020  Normal sinus rhythm.  Rare, briuef runs of PACs.  No sustained pathologic arrhythmias.      ROS: 14 pt review of systems performed and negative (unless mentioned in an HPI)  Objective: BP 129/64   Pulse 71   Temp 98.4 F (36.9 C) (Oral)   Ht '4\' 10"'$  (1.473 m)   Wt 136 lb (61.7 kg)   SpO2 98%   BMI 28.42 kg/m  Gen: Afebrile. No acute distress.  Nontoxic, very pleasant female. HENT: AT. [place].  No cough or  hoarseness Eyes:Pupils Equal Round Reactive to light, Extraocular movements intact,  Conjunctiva without redness, discharge or icterus. CV: RRR no murmur, no edema Chest: CTAB, no wheeze or crackles.  Good air movement, normal respiratory effort Skin: No rashes, purpura or petechiae.  Neuro: Normal gait. PERLA. EOMi. Alert. Oriented x3 Psych: Normal affect, dress and demeanor. Normal speech. Normal thought content and judgment.    No results found.  Assessment/plan: ELVERDA PRY is a 70 y.o. female present for CPE/CMC Gastroesophageal reflux disease, unspecified whether esophagitis present Stable Continue Protonix. Continue Pepcid.   Essential hypertension/HLD/Statin declined/Palpitations/Obesity (BMI 30-39.9) Stable.  Continue Tenormin 12.5 mg qhs continue  Microzide 12.5 mg daily PRN if notable fluid or  losartan 1/2 tab. Only if pressures are elevated > [id number] systolic.  Patient reports compliance with low dose statin twice a week supplied by Dr. Scarlette Calico.  > she has follow up schedule with him Low-sodium diet.  Routine exercise.   Osteoarthritis, unspecified osteoarthritis type, unspecified site/ DDD (degenerative disc disease), lumbar/Spinal stenosis of lumbar region, unspecified whether neurogenic claudication present/Fibromyalgia Stable Continue Mobic daily  Continue Zanaflex   Seasonal allergies/Allergic rhinitis due to pollen, unspecified seasonality/ Multiple food allergies Continue allergy med of choice.  Vistaril  Tired & not helpful.  Continue Singulair 10 mg nightly Continue flonase  Sleep disturbance: Stable Continue Ambien prn Tired vistaril- not helpful. Could consider trazodone in the future if willing.  nccs database reviewed today   irritable bowel syndrome Stable Continue  probiotic Continue Levsin 0.125 mg every 6 hours as needed  Asthma, intrinsic Stable.  Continue  Symbicort. Continue  albuterol as needed Continue antihistamine regimen    Hypothyroidism, unspecified type/Thyroid nodule Stable -Continue Armour Thyroid total dose 45 mg daily (30/15).  This is the only medicine called [facility]. Labs due next visit - US THYROID; Future ordered today for 1 yr follow up  Osteopenia, unspecified location - VITAMIN D 25 Hydroxy (Vit-D Deficiency, Fractures) - DG Bone Density; Future   Return in about 24 weeks (around 10/29/2021) for [place] (30 min).    No orders of the defined types were placed in this encounter.  Meds ordered this encounter  Medications   albuterol (PROAIR HFA) 108 (90 Base) MCG/ACT inhaler    Sig: Inhale 2 puffs into the lungs every 6 (six) hours as needed.    Dispense:  6.7 g    Refill:  5   albuterol (PROVENTIL) (2.5 MG/3ML) 0.083% nebulizer solution    Sig: Take 3 mLs (2.5 mg total) by nebulization every 6 (six) hours as needed.    Dispense:  75 mL  Refill:  0   atenolol (TENORMIN) 25 MG tablet    Sig: Take 0.5-1 tablets (12.5-25 mg total) by mouth daily.    Dispense:  90 tablet    Refill:  1   famotidine (PEPCID) 20 MG tablet    Sig: Take 1 tablet (20 mg total) by mouth daily.    Dispense:  90 tablet    Refill:  1   hydrochlorothiazide (MICROZIDE) 12.5 MG capsule    Sig: Take 1 capsule (12.5 mg total) by mouth daily as needed.    Dispense:  45 capsule    Refill:  1   hyoscyamine (LEVSIN) 0.125 MG tablet    Sig: Take 1 tablet (0.125 mg total) by mouth every 6 (six) hours as needed.    Dispense:  120 tablet    Refill:  5   montelukast (SINGULAIR) 10 MG tablet    Sig: TAKE 1 TABLET BY MOUTH EVERYDAY AT BEDTIME    Dispense:  90 tablet    Refill:  1   pantoprazole (PROTONIX) 40 MG tablet    Sig: Take 1 tablet (40 mg total) by mouth daily.    Dispense:  90 tablet    Refill:  1   tiZANidine (ZANAFLEX) 4 MG tablet    Sig: Take 1 tablet (4 mg total) by mouth every 6 (six) hours as needed for muscle spasms.    Dispense:  120 tablet    Refill:  5   ezetimibe (ZETIA) 10  MG tablet    Sig: Take 1 tablet (10 mg total) by mouth daily.    Dispense:  90 tablet    Refill:  3   SYMBICORT 80-4.5 MCG/ACT inhaler    Sig: INHALE TWO PUFFS INTO THE LUNGS IN THE MORNING AND AT BEDTIME    Dispense:  30.6 g    Refill:  11   ARMOUR THYROID 15 MG tablet    Sig: Take 1 tablet by mouth in the AM Once a day    Dispense:  90 tablet    Refill:  3   ARMOUR THYROID 30 MG tablet    Sig: Take 1 tablet by mouth in the AM once a day    Dispense:  90 tablet    Refill:  3   zolpidem (AMBIEN) 5 MG tablet    Sig: Take 1 tablet (5 mg total) by mouth at bedtime. As needed    Dispense:  90 tablet    Refill:  1    Referral Orders  No referral(s) requested today     Electronically signed by: Howard Pouch, [place]

## 2021-05-14 NOTE — Patient Instructions (Signed)
Great to see you today.  I have refilled the medication(s) we provide.   If labs were collected, we will inform you of lab results once received either by echart message or telephone call.   - echart message- for normal results that have been seen by the patient already.   - telephone call: abnormal results or if patient has not viewed results in their echart.  Next appt mid January.

## 2021-05-21 ENCOUNTER — Telehealth: Payer: Self-pay | Admitting: Family Medicine

## 2021-05-21 NOTE — Telephone Encounter (Signed)
Patient requesting Depo-Medrol shot for her sinus issues. She states she has gotten this before and it cleared everything up. Please call patient to advise and schedule if appropriate.

## 2021-05-21 NOTE — Telephone Encounter (Signed)
Spoke with pt regarding scheduling appt, questionnaire asked and in office appt scheduled with PCP for first available. Appt scheduled, advised pt she could call tomorrow and see if any work ins or openings available.

## 2021-05-21 NOTE — Telephone Encounter (Signed)
LVM for pt to CB regarding medication request.   Note:  Pt will need appt (VV if doesn't past protocol) to receive tx.

## 2021-05-22 ENCOUNTER — Encounter: Payer: Self-pay | Admitting: Family Medicine

## 2021-05-22 ENCOUNTER — Ambulatory Visit (INDEPENDENT_AMBULATORY_CARE_PROVIDER_SITE_OTHER): Payer: Medicare HMO | Admitting: Family Medicine

## 2021-05-22 ENCOUNTER — Other Ambulatory Visit: Payer: Self-pay

## 2021-05-22 VITALS — BP 118/63 | HR 62 | Temp 98.3°F | Wt 136.0 lb

## 2021-05-22 DIAGNOSIS — J209 Acute bronchitis, unspecified: Secondary | ICD-10-CM | POA: Diagnosis not present

## 2021-05-22 MED ORDER — DOXYCYCLINE HYCLATE 100 MG PO TABS: 100.0000 mg | ORAL_TABLET | Freq: Two times a day (BID) | ORAL | 0 refills | Status: DC

## 2021-05-22 MED ORDER — METHYLPREDNISOLONE ACETATE 80 MG/ML IJ SUSP
80.0000 mg | Freq: Once | INTRAMUSCULAR | Status: AC
  Administered 2021-05-22: 80 mg via INTRAMUSCULAR

## 2021-05-22 NOTE — Progress Notes (Signed)
This visit occurred during the SARS-CoV-2 public health emergency.  Safety protocols were in place, including screening questions prior to the visit, additional usage of staff PPE, and extensive cleaning of exam room while observing appropriate contact time as indicated for disinfecting solutions.    Patient ID: Rita Levine, female  DOB: 02/21/1951, 70 y.o.   MRN: [id number] Patient Care Team    Relationship Specialty Notifications Start End  Ma Hillock, DO PCP - General Family Medicine  12/01/19   Jettie Booze, MD PCP - Cardiology Cardiology  01/26/20   Milus Banister, MD Attending Physician Gastroenterology  12/01/19     Chief Complaint  Patient presents with   Sinusitis    Pt c/o post nasal drip, cough with bloody tint x 1 mo    Subjective: Rita Levine is a 70 y.o.  Female  present for continued sinusitis symptoms and bronchitis-like symptoms status post COVID.  Patient had been treated with antiviral initially after COVID positive testing.  She later was treated with Augmentin for sinusitis/bronchitis.  Her symptoms have continued mildly and she endorses a cough with brownish tinged phlegm.  She denies fevers or chills.  Depression screen [facility] 2/9 11/28/2020 07/18/2020 12/03/2019  Decreased Interest 0 0 0  Down, Depressed, Hopeless 0 0 0  PHQ - 2 Score 0 0 0   No flowsheet data found.   Immunization History  Administered Date(s) Administered   Influenza-Unspecified 06/11/2019, 07/14/2020   Pneumococcal Conjugate-13 07/03/2016   Pneumococcal Polysaccharide-23 08/03/2019   Tdap 05/02/2014   Zoster, Live 10/14/2010    Past Medical History:  Diagnosis Date   Allergic rhinitis    Allergic rhinitis due to pollen 12/03/2019   Anemia    Asthma    Chest pain, unspecified 04/07/2014   Chicken pox    Colon polyp    Diverticulosis    Dysphagia    Dysrhythmia    Palpitations   Fibromyalgia    Food allergy    Gallstone    GERD (gastroesophageal reflux disease)     Hiatal hernia    Hypercholesteremia    Hypertension    Hypothyroidism    IBS (irritable bowel syndrome)    Migraine headache    occasionally   Osteoarthritis    Osteopenia    Pneumonia    PONV (postoperative nausea and vomiting)    Pulmonary hypertension ([place])    pt denies, pt states she was tested for it but was not diagnosed with it   Rectal bleeding    SBO (small bowel obstruction) ([place]) 10/20/2017   Seasonal allergies    Thyroid nodule    Allergies  Allergen Reactions   Sulfamethoxazole-Trimethoprim Hives   Avelox [Moxifloxacin Hcl In Nacl]     Racing heart   Cephalexin Hives   Codeine Hives    Heart racing   Erythromycin Hives   Flagyl [Metronidazole]     Unknown reaction   Propranolol Hives   Sulfa Antibiotics Hives   Tetanus Toxoid Swelling    Reports a fever and headache with swelling.    Tramadol Hives   Statins Other (See Comments)    Myalgia    Past Surgical History:  Procedure Laterality Date   APPENDECTOMY  1982   BREAST EXCISIONAL BIOPSY Right 1995   benign   BREAST LUMPECTOMY  1991   CARPAL TUNNEL RELEASE Right 2006   Right wrist   CESAREAN SECTION     x 2   CHOLECYSTECTOMY N/A 07/25/2020   Procedure: LAPAROSCOPIC  CHOLECYSTECTOMY;  Surgeon: [name], MD;  Location: [facility];  Service: General;  Laterality: N/A;   COLON RESECTION  1990   COLONOSCOPY     CT ABDOMEN PELVIS W ([place] [facility])  01/03/2020   IMAGE MRI brain:  04/2012   No focal IAC or inner ear lesion to explain hearing loss. Slightly greater than expected number of subcortical T2- hyperintensities bilaterally.  These are nonspecific.  They can be seen in the setting of chronic migraine headaches, demyelinating process, chronic microvascular ischemic, prior infection or inflammation, or vasculitis   IMAGE MRI lumbar:  05/01/2006   Mild central canal stenosis with facet arthropathy and mildly bulging disc at L4-5.  There is mild narrowing in the left lateral recess at this level.  No  definite neural impingement.  Appearance at this level has not markedly Moderately severe facet arthropathy at L5-S1 with mild interval progression of a diffuse broad based disc bulge.  There is mild biforaminal narrowing.  Central canal is open   LAPAROSCOPIC ABDOMINAL EXPLORATION  2019   adhesion removal> caused bowel blockage    NASAL SEPTUM SURGERY  2004   TONSILLECTOMY  1965   TOTAL ABDOMINAL HYSTERECTOMY W/ BILATERAL SALPINGOOPHORECTOMY  1988   TUBAL LIGATION  1974   UPPER GASTROINTESTINAL ENDOSCOPY  2020   Family History  Problem Relation Age of Onset   Hyperlipidemia Father    Hypertension Father    Arthritis Father    Diabetes Father    Asthma Father    COPD Father    Early death Father    Parkinson's disease Father    Hypertension Mother    Hyperlipidemia Mother    Macular degeneration Mother    Arthritis Mother    Hearing loss Mother    Heart disease Mother    Kidney disease Mother    Throat cancer Brother        lung, and tongue   Arthritis Brother    COPD Brother    Diabetes type II Sister    COPD Sister    Heart disease Sister    Hypertension Sister    Thyroid cancer Sister    Lung cancer Sister    Early death Sister    COPD Sister    Breast cancer Maternal Aunt    Lung cancer Other        uncle   Emphysema Maternal Aunt    Emphysema Maternal Uncle    Clotting disorder Sister    Brain cancer Sister        brain tumor- not malignant   Breast cancer Cousin    Cancer Sister    Alcohol abuse Sister    COPD Sister    Early death Sister    Alcohol abuse Brother    Arthritis Brother    Depression Brother    Diabetes Brother    Hypercholesterolemia Brother    Colon cancer Neg Hx    Esophageal cancer Neg Hx    Rectal cancer Neg Hx    Stomach cancer Neg Hx    Social History   Social History Narrative   Marital status/children/pets: married, 2 children   Education/employment: HS grad. retired   Engineer, materials:      -smoke alarm in the home:Yes     -  wears seatbelt: Yes     - Feels safe in their relationships: Yes    Allergies as of 05/22/2021       Reactions   Sulfamethoxazole-trimethoprim Hives   Avelox [moxifloxacin Hcl In Nacl]  Racing heart   Cephalexin Hives   Codeine Hives   Heart racing   Erythromycin Hives   Flagyl [metronidazole]    Unknown reaction   Propranolol Hives   Sulfa Antibiotics Hives   Tetanus Toxoid Swelling   Reports a fever and headache with swelling.    Tramadol Hives   Statins Other (See Comments)   Myalgia        Medication List        Accurate as of May 22, 2021 11:59 PM. If you have any questions, ask your nurse or doctor.          albuterol 108 (90 Base) MCG/ACT inhaler Commonly known as: ProAir HFA Inhale 2 puffs into the lungs every 6 (six) hours as needed.   albuterol (2.5 MG/3ML) 0.083% nebulizer solution Commonly known as: PROVENTIL Take 3 mLs (2.5 mg total) by nebulization every 6 (six) hours as needed.   Armour Thyroid 15 MG tablet Generic drug: thyroid Take 1 tablet by mouth in the AM Once a day   Armour Thyroid 30 MG tablet Generic drug: thyroid Take 1 tablet by mouth in the AM once a day   atenolol 25 MG tablet Commonly known as: TENORMIN Take 0.5-1 tablets (12.5-25 mg total) by mouth daily.   calcium carbonate 500 MG chewable tablet Commonly known as: TUMS - dosed in mg elemental calcium Chew 3 tablets by mouth daily as needed for indigestion or heartburn.   CoQ-10 100 MG Caps Take 100 mg by mouth daily.   doxycycline 100 MG tablet Commonly known as: VIBRA-TABS Take 1 tablet (100 mg total) by mouth 2 (two) times daily. Started by: Howard Pouch, DO   ezetimibe 10 MG tablet Commonly known as: ZETIA Take 1 tablet (10 mg total) by mouth daily.   famotidine 20 MG tablet Commonly known as: PEPCID Take 1 tablet (20 mg total) by mouth daily.   fexofenadine 180 MG tablet Commonly known as: Allergy Relief Take 1 tablet (180 mg total) by mouth daily.    fluticasone 50 MCG/ACT nasal spray Commonly known as: FLONASE Place 2 sprays into both nostrils daily.   folic acid 1 MG tablet Commonly known as: FOLVITE SMARTSIG:2 Tablet(s) By Mouth   hydrochlorothiazide 12.5 MG capsule Commonly known as: MICROZIDE Take 1 capsule (12.5 mg total) by mouth daily as needed.   hyoscyamine 0.125 MG tablet Commonly known as: LEVSIN Take 1 tablet (0.125 mg total) by mouth every 6 (six) hours as needed.   levocetirizine 5 MG tablet Commonly known as: XYZAL Take 1 tablet (5 mg total) by mouth every evening.   meloxicam 15 MG tablet Commonly known as: MOBIC Take 1 tablet (15 mg total) by mouth daily.   mometasone 0.1 % lotion Commonly known as: ELOCON Apply 1 application topically daily as needed (ear irritation).   montelukast 10 MG tablet Commonly known as: SINGULAIR TAKE 1 TABLET BY MOUTH EVERYDAY AT BEDTIME   mupirocin ointment 2 % Commonly known as: BACTROBAN Apply 1 application topically 2 (two) times daily.   ondansetron 8 MG disintegrating tablet Commonly known as: ZOFRAN-ODT Take 1 tablet (8 mg total) by mouth every 8 (eight) hours as needed for nausea or vomiting.   pantoprazole 40 MG tablet Commonly known as: PROTONIX Take 1 tablet (40 mg total) by mouth daily.   Probiotic Caps Take 1 capsule by mouth daily.   QC TUMERIC COMPLEX PO Take by mouth.   rosuvastatin 10 MG tablet Commonly known as: CRESTOR Take 1 tablet (10 mg total)  by mouth 3 (three) times a week.   Symbicort 80-4.5 MCG/ACT inhaler Generic drug: budesonide-formoterol INHALE TWO PUFFS INTO THE LUNGS IN THE MORNING AND AT BEDTIME   THERATEARS OP Place 1 drop into both eyes 2 (two) times daily.   tiZANidine 4 MG tablet Commonly known as: ZANAFLEX Take 1 tablet (4 mg total) by mouth every 6 (six) hours as needed for muscle spasms.   VITAMIN A PO Take 1 tablet by mouth daily.   vitamin C 1000 MG tablet Take 3,000 mg by mouth daily.   Vitamin D 125  MCG (5000 UT) Caps Take 5,000 Units by mouth daily.   vitamin E 1000 UNIT capsule Take 1,000 Units by mouth daily.   zinc gluconate 50 MG tablet Take 100 mg by mouth daily.   zolpidem 5 MG tablet Commonly known as: AMBIEN Take 1 tablet (5 mg total) by mouth at bedtime. As needed        All past medical history, surgical history, allergies, family history, immunizations andmedications were updated in the EMR today and reviewed under the history and medication portions of their EMR.      ROS: 14 pt review of systems performed and negative (unless mentioned in an HPI)  Objective: BP 118/63   Pulse 62   Temp 98.3 F (36.8 C) (Oral)   Wt 136 lb (61.7 kg)   SpO2 97%   BMI 28.42 kg/m  Gen: Afebrile. No acute distress.  Nontoxic.  Pleasant female. HENT: AT. [place]. Bilateral TM visualized and normal in appearance. MMM. Bilateral nares without erythema or drainage. Throat without erythema or exudates.  Mild cough present.  No hoarseness present. Eyes:Pupils Equal Round Reactive to light, Extraocular movements intact,  Conjunctiva without redness, discharge or icterus. Neck/lymp/endocrine: Supple, no lymphadenopathy CV: RRR  Chest: CTAB, no wheeze or crackles.  Cough with deep inspiration present. Skin: no rashes, purpura or petechiae.  Neuro: Normal gait. PERLA. EOMi. Alert. Oriented x3 Psych: Normal affect, dress and demeanor. Normal speech. Normal thought content and judgment.    No results found.  Assessment/plan: Rita Levine is a 70 y.o. female present for  Acute bronchitis with symptoms > 10 days Continue to rest and hydrate. IM Depo-Medrol injection provided today. Start doxycycline twice daily time 7 days. - methylPREDNISolone acetate (DEPO-MEDROL) injection 80 mg Follow-up as needed   No follow-ups on file.    No orders of the defined types were placed in this encounter.  Meds ordered this encounter  Medications   methylPREDNISolone acetate (DEPO-MEDROL)  injection 80 mg   doxycycline (VIBRA-TABS) 100 MG tablet    Sig: Take 1 tablet (100 mg total) by mouth 2 (two) times daily.    Dispense:  14 tablet    Refill:  0     Referral Orders  No referral(s) requested today     Electronically signed by: Howard Pouch, [place]

## 2021-05-23 ENCOUNTER — Encounter: Payer: Self-pay | Admitting: Family Medicine

## 2021-05-25 ENCOUNTER — Ambulatory Visit: Payer: Medicare HMO | Admitting: Family Medicine

## 2021-05-29 NOTE — Telephone Encounter (Signed)
Per Dr Irish Lack- If she is feeling back to normal, no need for cardiac testing.  I suspect her sx were all related to COVID.

## 2021-06-09 ENCOUNTER — Telehealth: Payer: Self-pay

## 2021-06-09 NOTE — Telephone Encounter (Signed)
Spoke with pt and sched pt for AWV Tuesday '@[phone]'$ . Appt confirmed.

## 2021-06-12 ENCOUNTER — Encounter: Payer: Self-pay | Admitting: Family Medicine

## 2021-06-12 ENCOUNTER — Ambulatory Visit: Payer: Medicare HMO

## 2021-06-12 ENCOUNTER — Ambulatory Visit (INDEPENDENT_AMBULATORY_CARE_PROVIDER_SITE_OTHER): Payer: Medicare HMO

## 2021-06-12 DIAGNOSIS — Z0000 Encounter for general adult medical examination without abnormal findings: Secondary | ICD-10-CM

## 2021-06-12 DIAGNOSIS — L219 Seborrheic dermatitis, unspecified: Secondary | ICD-10-CM

## 2021-06-12 NOTE — Progress Notes (Addendum)
Chief Complaint  Patient presents with   Medicare Wellness    Subjective:   Rita Levine is a 70 y.o. female who presents for Medicare Annual (Subsequent) preventive examination.  I connected with  Rita Levine on 06/12/21 by an audio only telemedicine application and verified that I am speaking with the correct person using two identifiers.   I discussed the limitations, risks, security and privacy concerns of performing an evaluation and management service by telephone and the availability of in person appointments. I also discussed with the patient that there may be a patient responsible charge related to this service. The patient expressed understanding and verbally consented to this telephonic visit.  Location of Patient: Home Location of Provider: Office  List any persons and their role that are participating in the visit with the patient.    Review of Systems    Defer to PCP       Objective:    Today's Vitals   06/12/21 1748  PainSc: 7    There is no height or weight on file to calculate BMI.  Advanced Directives 04/28/2021 10/23/2020 07/21/2020 08/05/2016  Does Patient Have a Medical Advance Directive? No Yes Yes Yes  Type of Advance Directive - [place];Living will [place];Living will -  Does patient want to make changes to medical advance directive? - - No - Patient declined -  Copy of [place] in Chart? - - No - copy requested No - copy requested  Would patient like information on creating a medical advance directive? No - Patient declined - - -    Current Medications (verified) Outpatient Encounter Medications as of 06/12/2021  Medication Sig   albuterol (PROAIR HFA) 108 (90 Base) MCG/ACT inhaler Inhale 2 puffs into the lungs every 6 (six) hours as needed.   albuterol (PROVENTIL) (2.5 MG/3ML) 0.083% nebulizer solution Take 3 mLs (2.5 mg total) by nebulization every 6 (six) hours as needed.   ARMOUR  THYROID 15 MG tablet Take 1 tablet by mouth in the AM Once a day   ARMOUR THYROID 30 MG tablet Take 1 tablet by mouth in the AM once a day   Ascorbic Acid (VITAMIN C) 1000 MG tablet Take 3,000 mg by mouth daily.   atenolol (TENORMIN) 25 MG tablet Take 0.5-1 tablets (12.5-25 mg total) by mouth daily.   calcium carbonate (TUMS - DOSED IN MG ELEMENTAL CALCIUM) 500 MG chewable tablet Chew 3 tablets by mouth daily as needed for indigestion or heartburn.   Carboxymethylcellulose Sodium (THERATEARS OP) Place 1 drop into both eyes 2 (two) times daily.   Cholecalciferol (VITAMIN D) 125 MCG (5000 UT) CAPS Take 5,000 Units by mouth daily.    Coenzyme Q10 (COQ-10) 100 MG CAPS Take 100 mg by mouth daily.   ezetimibe (ZETIA) 10 MG tablet Take 1 tablet (10 mg total) by mouth daily.   famotidine (PEPCID) 20 MG tablet Take 1 tablet (20 mg total) by mouth daily.   fexofenadine (ALLERGY RELIEF) 180 MG tablet Take 1 tablet (180 mg total) by mouth daily.   fluticasone (FLONASE) 50 MCG/ACT nasal spray Place 2 sprays into both nostrils daily.   folic acid (FOLVITE) 1 MG tablet SMARTSIG:2 Tablet(s) By Mouth   hydrochlorothiazide (MICROZIDE) 12.5 MG capsule Take 1 capsule (12.5 mg total) by mouth daily as needed.   hyoscyamine (LEVSIN) 0.125 MG tablet Take 1 tablet (0.125 mg total) by mouth every 6 (six) hours as needed.   levocetirizine (XYZAL)  5 MG tablet Take 1 tablet (5 mg total) by mouth every evening.   meloxicam (MOBIC) 15 MG tablet Take 1 tablet (15 mg total) by mouth daily.   mometasone (ELOCON) 0.1 % lotion Apply 1 application topically daily as needed (ear irritation).    montelukast (SINGULAIR) 10 MG tablet TAKE 1 TABLET BY MOUTH EVERYDAY AT BEDTIME   mupirocin ointment (BACTROBAN) 2 % Apply 1 application topically 2 (two) times daily.   ondansetron (ZOFRAN-ODT) 8 MG disintegrating tablet Take 1 tablet (8 mg total) by mouth every 8 (eight) hours as needed for nausea or vomiting.   pantoprazole (PROTONIX) 40  MG tablet Take 1 tablet (40 mg total) by mouth daily.   Probiotic CAPS Take 1 capsule by mouth daily.   rosuvastatin (CRESTOR) 10 MG tablet Take 1 tablet (10 mg total) by mouth 3 (three) times a week.   SYMBICORT 80-4.5 MCG/ACT inhaler INHALE TWO PUFFS INTO THE LUNGS IN THE MORNING AND AT BEDTIME   tiZANidine (ZANAFLEX) 4 MG tablet Take 1 tablet (4 mg total) by mouth every 6 (six) hours as needed for muscle spasms.   Turmeric (QC TUMERIC COMPLEX PO) Take by mouth.   VITAMIN A PO Take 1 tablet by mouth daily.   vitamin E 1000 UNIT capsule Take 1,000 Units by mouth daily.   zinc gluconate 50 MG tablet Take 100 mg by mouth daily.   zolpidem (AMBIEN) 5 MG tablet Take 1 tablet (5 mg total) by mouth at bedtime. As needed   doxycycline (VIBRA-TABS) 100 MG tablet Take 1 tablet (100 mg total) by mouth 2 (two) times daily. (Patient not taking: Reported on 06/12/2021)   No facility-administered encounter medications on file as of 06/12/2021.    Allergies (verified) Sulfamethoxazole-trimethoprim, Avelox [moxifloxacin hcl in nacl], Cephalexin, Codeine, Erythromycin, Flagyl [metronidazole], Propranolol, Sulfa antibiotics, Tetanus toxoid, Tramadol, and Statins   History: Past Medical History:  Diagnosis Date   Allergic rhinitis    Allergic rhinitis due to pollen 12/03/2019   Anemia    Asthma    Chest pain, unspecified 04/07/2014   Chicken pox    Colon polyp    Diverticulosis    Dysphagia    Dysrhythmia    Palpitations   Fibromyalgia    Food allergy    Gallstone    GERD (gastroesophageal reflux disease)    Hiatal hernia    Hypercholesteremia    Hypertension    Hypothyroidism    IBS (irritable bowel syndrome)    Migraine headache    occasionally   Osteoarthritis    Osteopenia    Pneumonia    PONV (postoperative nausea and vomiting)    Pulmonary hypertension ([place])    pt denies, pt states she was tested for it but was not diagnosed with it   Rectal bleeding    SBO (small bowel obstruction)  ([place]) 10/20/2017   Seasonal allergies    Thyroid nodule    Past Surgical History:  Procedure Laterality Date   APPENDECTOMY  1982   BREAST EXCISIONAL BIOPSY Right 1995   benign   BREAST LUMPECTOMY  1991   CARPAL TUNNEL RELEASE Right 2006   Right wrist   CESAREAN SECTION     x 2   CHOLECYSTECTOMY N/A 07/25/2020   Procedure: LAPAROSCOPIC CHOLECYSTECTOMY;  Surgeon: [name], MD;  Location: [facility];  Service: General;  Laterality: N/A;   COLON RESECTION  1990   COLONOSCOPY     CT ABDOMEN PELVIS W ([place] [facility])  01/03/2020   IMAGE MRI brain:  04/2012   No focal IAC or inner ear lesion to explain hearing loss. Slightly greater than expected number of subcortical T2- hyperintensities bilaterally.  These are nonspecific.  They can be seen in the setting of chronic migraine headaches, demyelinating process, chronic microvascular ischemic, prior infection or inflammation, or vasculitis   IMAGE MRI lumbar:  05/01/2006   Mild central canal stenosis with facet arthropathy and mildly bulging disc at L4-5.  There is mild narrowing in the left lateral recess at this level.  No definite neural impingement.  Appearance at this level has not markedly Moderately severe facet arthropathy at L5-S1 with mild interval progression of a diffuse broad based disc bulge.  There is mild biforaminal narrowing.  Central canal is open   LAPAROSCOPIC ABDOMINAL EXPLORATION  2019   adhesion removal> caused bowel blockage    NASAL SEPTUM SURGERY  2004   TONSILLECTOMY  1965   TOTAL ABDOMINAL HYSTERECTOMY W/ BILATERAL SALPINGOOPHORECTOMY  1988   TUBAL LIGATION  1974   UPPER GASTROINTESTINAL ENDOSCOPY  2020   Family History  Problem Relation Age of Onset   Hyperlipidemia Father    Hypertension Father    Arthritis Father    Diabetes Father    Asthma Father    COPD Father    Early death Father    Parkinson's disease Father    Hypertension Mother    Hyperlipidemia Mother    Macular degeneration Mother    Arthritis  Mother    Hearing loss Mother    Heart disease Mother    Kidney disease Mother    Throat cancer Brother        lung, and tongue   Arthritis Brother    COPD Brother    Diabetes type II Sister    COPD Sister    Heart disease Sister    Hypertension Sister    Thyroid cancer Sister    Lung cancer Sister    Early death Sister    COPD Sister    Breast cancer Maternal Aunt    Lung cancer Other        uncle   Emphysema Maternal Aunt    Emphysema Maternal Uncle    Clotting disorder Sister    Brain cancer Sister        brain tumor- not malignant   Breast cancer Cousin    Cancer Sister    Alcohol abuse Sister    COPD Sister    Early death Sister    Alcohol abuse Brother    Arthritis Brother    Depression Brother    Diabetes Brother    Hypercholesterolemia Brother    Colon cancer Neg Hx    Esophageal cancer Neg Hx    Rectal cancer Neg Hx    Stomach cancer Neg Hx    Social History   Socioeconomic History   Marital status: Married    Spouse name: Windy Canny   Number of children: 2   Years of education: Not on file   Highest education level: Not on file  Occupational History   Occupation: homemaker  Tobacco Use   Smoking status: Never   Smokeless tobacco: Never  Vaping Use   Vaping Use: Never used  Substance and Sexual Activity   Alcohol use: Yes    Alcohol/week: 0.0 standard drinks    Comment: occasionally   Drug use: No   Sexual activity: Yes    Partners: Male    Comment: married  Other Topics Concern   Not on file  Social  History Narrative   Marital status/children/pets: married, 2 children   Education/employment: HS grad. retired   Engineer, materials:      -smoke alarm in the home:Yes     - wears seatbelt: Yes     - Feels safe in their relationships: Yes   Social Determinants of Health   Financial Resource Strain: Low Risk    Difficulty of Paying Living Expenses: Not hard at all  Food Insecurity: No Food Insecurity   Worried About Charity fundraiser in the Last  Year: Never true   [place] in the Last Year: Never true  Transportation Needs: No Transportation Needs   Lack of Transportation (Medical): No   Lack of Transportation (Non-Medical): No  Physical Activity: Sufficiently Active   Days of Exercise per Week: 5 days   Minutes of Exercise per Session: 30 min  Stress: No Stress Concern Present   Feeling of Stress : Not at all  Social Connections: Socially Integrated   Frequency of Communication with Friends and Family: More than three times a week   Frequency of Social Gatherings with Friends and Family: More than three times a week   Attends Religious Services: More than 4 times per year   Active Member of [employer] or Organizations: Yes   Attends Music therapist: More than 4 times per year   Marital Status: Married    Tobacco Counseling Counseling given: Not Answered   Clinical Intake:  Pre-visit preparation completed: Yes  Pain : 0-10 Pain Score: 7  Pain Type: Chronic pain, Acute pain Pain Location: Abdomen Pain Descriptors / Indicators: Cramping, Pressure Pain Onset: 1 to 4 weeks ago Pain Frequency: Constant Pain Relieving Factors: none  Pain Relieving Factors: none  Nutritional Risks: None, Unintentional weight gain Diabetes: No  How often do you need to have someone help you when you read instructions, pamphlets, or other written materials from your doctor or pharmacy?: 1 - Never What is the last grade level you completed in school?: 12+  Diabetic?No  Interpreter Needed?: No      Activities of Daily Living In your present state of health, do you have any difficulty performing the following activities: 06/12/2021 07/21/2020  Hearing? N N  Vision? Y N  Comment - wears glasses, has cataracts  Difficulty concentrating or making decisions? N N  Walking or climbing stairs? N N  Dressing or bathing? N N  Doing errands, shopping? N N  Some recent data might be hidden    Patient Care Team: Ma Hillock, DO as PCP - General (Family Medicine) Jettie Booze, MD as PCP - Cardiology (Cardiology) Milus Banister, MD as Attending Physician (Gastroenterology)  Indicate any recent Medical Services you may have received from other than [facility] providers in the past year (date may be approximate).     Assessment:   This is a routine wellness examination for [place].  Hearing/Vision screen No results found.  Dietary issues and exercise activities discussed:     Goals Addressed   None   Depression Screen PHQ 2/9 Scores 06/12/2021 11/28/2020 07/18/2020 12/03/2019  PHQ - 2 Score 0 0 0 0    Fall Risk Fall Risk  06/12/2021 10/23/2020 07/18/2020 12/03/2019  Falls in the past year? 0 0 0 0  Number falls in past yr: 0 0 0 0  Injury with Fall? 0 0 0 0  Risk for fall due to : No Fall Risks - - -  Follow up Falls evaluation completed - -  Falls evaluation completed    FALL RISK PREVENTION PERTAINING TO THE HOME:  Any stairs in or around the home? Yes  If so, are there any without handrails? Yes  Home free of loose throw rugs in walkways, pet beds, electrical cords, etc? Yes  Adequate lighting in your home to reduce risk of falls? Yes   ASSISTIVE DEVICES UTILIZED TO PREVENT FALLS:  Life alert? No  Use of a cane, walker or w/c? No  Grab bars in the bathroom? No  Shower chair or bench in shower? No  Elevated toilet seat or a handicapped toilet? Yes   TIMED UP AND GO:  Was the test performed?  n/a .  Length of time to ambulate 10 feet: n/a sec.    Cognitive Function:     6CIT Screen 06/12/2021  What Year? 0 points  What month? 0 points  What time? 0 points  Count back from 20 0 points  Months in reverse 0 points  Repeat phrase 0 points  Total Score 0    Immunizations Immunization History  Administered Date(s) Administered   Influenza-Unspecified 06/11/2019, 07/14/2020   Pneumococcal Conjugate-13 07/03/2016   Pneumococcal Polysaccharide-23 08/03/2019   Tdap 05/02/2014    Zoster, Live 10/14/2010    TDAP status: Up to date  Flu Vaccine status: Up to date  Pneumococcal vaccine status: Up to date  Covid-19 vaccine status: Declined, Education has been provided regarding the importance of this vaccine but patient still declined. Advised may receive this vaccine at local pharmacy or Health Dept.or vaccine clinic. Aware to provide a copy of the vaccination record if obtained from local pharmacy or Health Dept. Verbalized acceptance and understanding.  Qualifies for Shingles Vaccine? Yes   Zostavax completed No   Shingrix Completed?: No.    Education has been provided regarding the importance of this vaccine. Patient has been advised to call insurance company to determine out of pocket expense if they have not yet received this vaccine. Advised may also receive vaccine at local pharmacy or Health Dept. Verbalized acceptance and understanding.  Screening Tests Health Maintenance  Topic Date Due   INFLUENZA VACCINE  07/08/2021 (Originally 05/14/2021)   Zoster Vaccines- Shingrix (1 of 2) 08/14/2021 (Originally 05/17/1970)   COVID-19 Vaccine (1) 10/16/2021 (Originally 05/17/1956)   MAMMOGRAM  09/26/2022   DEXA SCAN  01/04/2023   COLONOSCOPY (Pts 45-62yr Insurance coverage will need to be confirmed)  08/05/2026   Hepatitis C Screening  Completed   PNA vac Low Risk Adult  Completed   HPV VACCINES  Aged Out   TETANUS/TDAP  Discontinued    Health Maintenance  There are no preventive care reminders to display for this patient.  Colorectal cancer screening: Type of screening: Colonoscopy. Completed 08/05/2016. Repeat every 10 years  Mammogram status: Completed 09/26/20. Repeat every year  Bone Density status: Completed 01/03/21. Results reflect: Bone density results: OSTEOPENIA. Repeat every 2 years.  Lung Cancer Screening: (Low Dose CT Chest recommended if Age 70-80years, 30 pack-year currently smoking OR have quit w/in 15years.) does not qualify.   Lung Cancer  Screening Referral: n/a  Additional Screening:  Hepatitis C Screening: does qualify; Completed 11/28/20  Vision Screening: Recommended annual ophthalmology exams for early detection of glaucoma and other disorders of the eye. Is the patient up to date with their annual eye exam?  No  Who is the provider or what is the name of the office in which the patient attends annual eye exams? Dr. KLaurence AlyIf pt is not established  with a provider, would they like to be referred to a provider to establish care? No .   Dental Screening: Recommended annual dental exams for proper oral hygiene  Community Resource Referral / Chronic Care Management: CRR required this visit?  No   CCM required this visit?  No      Plan:     I have personally reviewed and noted the following in the patient's chart:   Medical and social history Use of alcohol, tobacco or illicit drugs  Current medications and supplements including opioid prescriptions.  Functional ability and status Nutritional status Physical activity Advanced directives List of other physicians Hospitalizations, surgeries, and ER visits in previous 12 months Vitals Screenings to include cognitive, depression, and falls Referrals and appointments  In addition, I have reviewed and discussed with patient certain preventive protocols, quality metrics, and best practice recommendations. A written personalized care plan for preventive services as well as general preventive health recommendations were provided to patient.     Kavin Leech, [place]   06/12/2021   Nurse Notes: Non-face to face 45 minute visit   Ms. Rasmusson , Thank you for taking time to come for your Medicare Wellness Visit. I appreciate your ongoing commitment to your health goals. Please review the following plan we discussed and let me know if I can assist you in the future.   These are the goals we discussed:  Goals   None     This is a list of the screening  recommended for you and due dates:  Health Maintenance  Topic Date Due   Flu Shot  07/08/2021*   Zoster (Shingles) Vaccine (1 of 2) 08/14/2021*   COVID-19 Vaccine (1) 10/16/2021*   Mammogram  09/26/2022   DEXA scan (bone density measurement)  01/04/2023   Colon Cancer Screening  08/05/2026   Hepatitis C Screening: USPSTF Recommendation to screen - Ages 18-79 yo.  Completed   Pneumonia vaccines  Completed   HPV Vaccine  Aged Out   Tetanus Vaccine  Discontinued  *Topic was postponed. The date shown is not the original due date.      Medical screening examination/treatment/procedure(s) were performed by non-physician practitioner and as supervising physician I was immediately available for consultation/collaboration.  I agree with above assessment and plan.  Electronically Signed by: Howard Pouch, DO [facility] [place]

## 2021-06-13 NOTE — Addendum Note (Signed)
Addended by: Kavin Leech on: 06/13/2021 03:28 PM   Modules accepted: Orders

## 2021-06-13 NOTE — Telephone Encounter (Signed)
Pt had derm in past but need new referral. Please advise.

## 2021-06-13 NOTE — Telephone Encounter (Signed)
Ok to place referral.

## 2021-06-22 ENCOUNTER — Other Ambulatory Visit: Payer: Self-pay

## 2021-06-26 ENCOUNTER — Encounter: Payer: Self-pay | Admitting: Family Medicine

## 2021-06-27 ENCOUNTER — Other Ambulatory Visit: Payer: Self-pay | Admitting: Family Medicine

## 2021-06-27 ENCOUNTER — Ambulatory Visit (INDEPENDENT_AMBULATORY_CARE_PROVIDER_SITE_OTHER): Payer: Medicare HMO | Admitting: Family Medicine

## 2021-06-27 ENCOUNTER — Other Ambulatory Visit: Payer: Self-pay

## 2021-06-27 ENCOUNTER — Encounter: Payer: Self-pay | Admitting: Family Medicine

## 2021-06-27 VITALS — BP 155/85 | HR 69 | Temp 97.9°F | Ht <= 58 in | Wt 141.2 lb

## 2021-06-27 DIAGNOSIS — R058 Other specified cough: Secondary | ICD-10-CM | POA: Diagnosis not present

## 2021-06-27 DIAGNOSIS — R0982 Postnasal drip: Secondary | ICD-10-CM | POA: Diagnosis not present

## 2021-06-27 DIAGNOSIS — J309 Allergic rhinitis, unspecified: Secondary | ICD-10-CM | POA: Diagnosis not present

## 2021-06-27 DIAGNOSIS — K219 Gastro-esophageal reflux disease without esophagitis: Secondary | ICD-10-CM

## 2021-06-27 MED ORDER — PANTOPRAZOLE SODIUM 40 MG PO TBEC: 40.0000 mg | DELAYED_RELEASE_TABLET | Freq: Two times a day (BID) | ORAL | 0 refills | Status: DC

## 2021-06-27 MED ORDER — TRETINOIN 0.1 % EX CREA: TOPICAL_CREAM | Freq: Every day | CUTANEOUS | 11 refills | Status: DC

## 2021-06-27 NOTE — Progress Notes (Signed)
OFFICE VISIT  06/27/2021  CC:  Chief Complaint  Patient presents with   Cough    X 2 months, given rx in the past for hydroxyurea '500mg'$ , dexamethasone '2mg'$ , and azithromycin '250mg'$  for treatment. Occasionally productive; describes it as a jellyfish baby    HPI:    Patient is a 70 y.o. Caucasian female who presents for cough. Chronic cough x at least 2 yrs.  Occ productive.  Some burning across upper chest and sternum. Voice gets gravelly voice, always feels something in throat, clears throat a lot.  No ST.  Only occ wheeze, no chest tightness or SOB. Denies classic sx's of GER.  Takes famotidine '20mg'$  qd and pantoprazole 40 qd and nonsedating antihist, nasal steroid, and singulair long term. No hemoptysis, no fever, no wt loss.  She does carry diag of asthma, her asthma sx's have always been wheezing, not cough. She was tx'd with depo medrol '80mg'$  IM and doxy x 7d for bronchitis illness (05/22/21). She had covid about 2 mo ago. No hx of smoking. She got CT chest with contrast 01/15/21 for chronic cough and it showed no cause, showed one small nodule which will get surveillance in 1 yr d/t her FH of lung ca.  ROS as above, plus--> no dizziness, no HAs, no rashes, no melena/hematochezia.  No polyuria or polydipsia.   No focal weakness, paresthesias, or tremors.  No acute vision or hearing abnormalities.  No dysuria or unusual/new urinary urgency or frequency.  No recent changes in lower legs. No n/v/d or abd pain.  No palpitations.    Past Medical History:  Diagnosis Date   Allergic rhinitis    Allergic rhinitis due to pollen 12/03/2019   Anemia    Asthma    Chest pain, unspecified 04/07/2014   Chicken pox    Colon polyp    Diverticulosis    Dysphagia    Dysrhythmia    Palpitations   Fibromyalgia    Food allergy    Gallstone    GERD (gastroesophageal reflux disease)    Hiatal hernia    Hypercholesteremia    Hypertension    Hypothyroidism    IBS (irritable bowel syndrome)     Migraine headache    occasionally   Osteoarthritis    Osteopenia    Pneumonia    PONV (postoperative nausea and vomiting)    Pulmonary hypertension ([place])    pt denies, pt states she was tested for it but was not diagnosed with it   Rectal bleeding    SBO (small bowel obstruction) ([place]) 10/20/2017   Seasonal allergies    Thyroid nodule     Past Surgical History:  Procedure Laterality Date   APPENDECTOMY  1982   BREAST EXCISIONAL BIOPSY Right 1995   benign   BREAST LUMPECTOMY  1991   CARPAL TUNNEL RELEASE Right 2006   Right wrist   CESAREAN SECTION     x 2   CHOLECYSTECTOMY N/A 07/25/2020   Procedure: LAPAROSCOPIC CHOLECYSTECTOMY;  Surgeon: [name], MD;  Location: [place];  Service: General;  Laterality: N/A;   COLON RESECTION  1990   COLONOSCOPY     CT ABDOMEN PELVIS W ([place] HX)  01/03/2020   IMAGE MRI brain:  04/2012   No focal IAC or inner ear lesion to explain hearing loss. Slightly greater than expected number of subcortical T2- hyperintensities bilaterally.  These are nonspecific.  They can be seen in the setting of chronic migraine headaches, demyelinating process, chronic microvascular ischemic, prior infection or  inflammation, or vasculitis   IMAGE MRI lumbar:  05/01/2006   Mild central canal stenosis with facet arthropathy and mildly bulging disc at L4-5.  There is mild narrowing in the left lateral recess at this level.  No definite neural impingement.  Appearance at this level has not markedly Moderately severe facet arthropathy at L5-S1 with mild interval progression of a diffuse broad based disc bulge.  There is mild biforaminal narrowing.  Central canal is open   LAPAROSCOPIC ABDOMINAL EXPLORATION  2019   adhesion removal> caused bowel blockage    NASAL SEPTUM SURGERY  2004   TONSILLECTOMY  1965   TOTAL ABDOMINAL HYSTERECTOMY W/ BILATERAL SALPINGOOPHORECTOMY  1988   TUBAL LIGATION  1974   UPPER GASTROINTESTINAL ENDOSCOPY  2020    Outpatient Medications Prior  to Visit  Medication Sig Dispense Refill   albuterol (PROAIR HFA) 108 (90 Base) MCG/ACT inhaler Inhale 2 puffs into the lungs every 6 (six) hours as needed. 6.7 g 5   ARMOUR THYROID 15 MG tablet Take 1 tablet by mouth in the AM Once a day 90 tablet 3   ARMOUR THYROID 30 MG tablet Take 1 tablet by mouth in the AM once a day 90 tablet 3   Ascorbic Acid (VITAMIN C) 1000 MG tablet Take 3,000 mg by mouth daily.     atenolol (TENORMIN) 25 MG tablet Take 0.5-1 tablets (12.5-25 mg total) by mouth daily. 90 tablet 1   calcium carbonate (TUMS - DOSED IN MG ELEMENTAL CALCIUM) 500 MG chewable tablet Chew 3 tablets by mouth daily as needed for indigestion or heartburn.     Carboxymethylcellulose Sodium (THERATEARS OP) Place 1 drop into both eyes 2 (two) times daily.     Cholecalciferol (VITAMIN D) 125 MCG (5000 UT) CAPS Take 5,000 Units by mouth daily.      Coenzyme Q10 (COQ-10) 100 MG CAPS Take 100 mg by mouth daily.     ezetimibe (ZETIA) 10 MG tablet Take 1 tablet (10 mg total) by mouth daily. 90 tablet 3   fexofenadine (ALLERGY RELIEF) 180 MG tablet Take 1 tablet (180 mg total) by mouth daily. 90 tablet 1   fluticasone (FLONASE) 50 MCG/ACT nasal spray Place 2 sprays into both nostrils daily. 16 mL 11   folic acid (FOLVITE) 1 MG tablet SMARTSIG:2 Tablet(s) By Mouth     hydrochlorothiazide (MICROZIDE) 12.5 MG capsule Take 1 capsule (12.5 mg total) by mouth daily as needed. 45 capsule 1   hyoscyamine (LEVSIN) 0.125 MG tablet Take 1 tablet (0.125 mg total) by mouth every 6 (six) hours as needed. 120 tablet 5   levocetirizine (XYZAL) 5 MG tablet Take 1 tablet (5 mg total) by mouth every evening. 90 tablet 3   meloxicam (MOBIC) 15 MG tablet Take 1 tablet (15 mg total) by mouth daily. 90 tablet 1   mometasone (ELOCON) 0.1 % lotion Apply 1 application topically daily as needed (ear irritation).      montelukast (SINGULAIR) 10 MG tablet TAKE 1 TABLET BY MOUTH EVERYDAY AT BEDTIME 90 tablet 1   mupirocin ointment  (BACTROBAN) 2 % Apply 1 application topically 2 (two) times daily. 30 g 2   ondansetron (ZOFRAN-ODT) 8 MG disintegrating tablet Take 1 tablet (8 mg total) by mouth every 8 (eight) hours as needed for nausea or vomiting. 30 tablet 1   Probiotic CAPS Take 1 capsule by mouth daily.     rosuvastatin (CRESTOR) 10 MG tablet Take 1 tablet (10 mg total) by mouth 3 (three) times a week.  40 tablet 3   SYMBICORT 80-4.5 MCG/ACT inhaler INHALE TWO PUFFS INTO THE LUNGS IN THE MORNING AND AT BEDTIME 30.6 g 11   tiZANidine (ZANAFLEX) 4 MG tablet Take 1 tablet (4 mg total) by mouth every 6 (six) hours as needed for muscle spasms. 120 tablet 5   Turmeric (QC TUMERIC COMPLEX PO) Take by mouth.     VITAMIN A PO Take 1 tablet by mouth daily.     vitamin E 1000 UNIT capsule Take 1,000 Units by mouth daily.     zinc gluconate 50 MG tablet Take 100 mg by mouth daily.     zolpidem (AMBIEN) 5 MG tablet Take 1 tablet (5 mg total) by mouth at bedtime. As needed 90 tablet 1   famotidine (PEPCID) 20 MG tablet Take 1 tablet (20 mg total) by mouth daily. 90 tablet 1   pantoprazole (PROTONIX) 40 MG tablet Take 1 tablet (40 mg total) by mouth daily. 90 tablet 1   albuterol (PROVENTIL) (2.5 MG/3ML) 0.083% nebulizer solution Take 3 mLs (2.5 mg total) by nebulization every 6 (six) hours as needed. (Patient not taking: Reported on 06/27/2021) 75 mL 0   doxycycline (VIBRA-TABS) 100 MG tablet Take 1 tablet (100 mg total) by mouth 2 (two) times daily. (Patient not taking: No sig reported) 14 tablet 0   No facility-administered medications prior to visit.    Allergies  Allergen Reactions   Sulfamethoxazole-Trimethoprim Hives   Avelox [Moxifloxacin Hcl In Nacl]     Racing heart   Cephalexin Hives   Codeine Hives    Heart racing   Erythromycin Hives   Flagyl [Metronidazole]     Unknown reaction   Propranolol Hives   Sulfa Antibiotics Hives   Tetanus Toxoid Swelling    Reports a fever and headache with swelling.    Tramadol  Hives   Statins Other (See Comments)    Myalgia     ROS As per HPI  PE: Vitals with BMI 06/27/2021 05/22/2021 05/14/2021  Height '4\' 10"'$  - '4\' 10"'$   Weight 141 lbs 3 oz 136 lbs 136 lbs  BMI 29.52 Q000111Q Q000111Q  Systolic [id number] Q000111Q  Diastolic 85 63 64  Pulse 69 62 71   Gen: Alert, well appearing.  Patient is oriented to person, place, time, and situation. AFFECT: pleasant, lucid thought and speech. CV: RRR, no m/r/g.   LUNGS: CTA bilat, nonlabored resps, good aeration in all lung fields. EXT: no clubbing or cyanosis.  no edema.    LABS:  Lab Results  Component Value Date   TSH 1.05 11/28/2020   Lab Results  Component Value Date   WBC 4.5 04/28/2021   HGB 14.2 04/28/2021   HCT 41.8 04/28/2021   MCV 83.3 04/28/2021   PLT 247 04/28/2021   Lab Results  Component Value Date   CREATININE 0.67 04/28/2021   BUN 8 04/28/2021   NA 134 (L) 04/28/2021   K 4.3 04/28/2021   CL 102 04/28/2021   CO2 23 04/28/2021   Lab Results  Component Value Date   ALT 36 04/28/2021   AST 48 (H) 04/28/2021   ALKPHOS 89 04/28/2021   BILITOT 0.8 04/28/2021   Lab Results  Component Value Date   CHOL 167 01/04/2021   Lab Results  Component Value Date   HDL 49 01/04/2021   Lab Results  Component Value Date   LDLCALC 92 01/04/2021   Lab Results  Component Value Date   TRIG 149 01/04/2021   Lab Results  Component  Value Date   CHOLHDL 3.4 01/04/2021   Lab Results  Component Value Date   HGBA1C 5.8 (H) 11/28/2020   IMPRESSION AND PLAN:  Chronic cough: highly suspect upper airway cough syndrome. Discussed basis for this dx, pathophys, etc. She is on max allerg rhinitis tx but we can increase her reflux treatment to '40mg'$  pantoprazole bid. Offered referral to cough clinic with [facility] but she declined for now. Has GI f/u later this month for GER f/u.  INSTRUCTIONS: Elevate the head of your bed (ideally with 6 inch  bed blocks),  Smoking cessation, avoidance of late meals, excessive  alcohol, and avoid fatty foods, chocolate, peppermint, colas, red wine, and acidic juices such as orange juice.  NO MINT OR MENTHOL PRODUCTS SO NO COUGH DROPS   USE SUGARLESS CANDY INSTEAD (Jolley ranchers or Stover's or Life Savers) or even ice chips will also do - the key is to swallow to prevent all throat clearing. NO OIL BASED VITAMINS - use powdered substitutes.  An After Visit Summary was printed and given to the patient.  FOLLOW UP: Return if symptoms worsen or fail to improve.  Signed:  Crissie Sickles, MD           06/27/2021

## 2021-06-27 NOTE — Progress Notes (Signed)
Refilled Retin-A cream for her today.  If not covered by insurance, patient will need to pay out-of-pocket.

## 2021-07-04 ENCOUNTER — Ambulatory Visit: Payer: Medicare HMO | Admitting: Gastroenterology

## 2021-07-04 ENCOUNTER — Encounter: Payer: Self-pay | Admitting: Gastroenterology

## 2021-07-04 VITALS — BP 142/70 | HR 61 | Ht <= 58 in | Wt 140.5 lb

## 2021-07-04 DIAGNOSIS — K219 Gastro-esophageal reflux disease without esophagitis: Secondary | ICD-10-CM | POA: Diagnosis not present

## 2021-07-04 DIAGNOSIS — R053 Chronic cough: Secondary | ICD-10-CM | POA: Insufficient documentation

## 2021-07-04 DIAGNOSIS — K589 Irritable bowel syndrome without diarrhea: Secondary | ICD-10-CM

## 2021-07-04 MED ORDER — FAMOTIDINE 20 MG PO TABS: 20.0000 mg | ORAL_TABLET | Freq: Every day | ORAL | 4 refills | Status: DC

## 2021-07-04 NOTE — Progress Notes (Signed)
07/04/2021 Rita Levine [id number] Nov 24, 1950   HISTORY OF PRESENT ILLNESS: This is a 70 year old female who is a patient of Dr. Ardis Hughs, only seen by him in our office twice, in 2017 and 2020, mostly for issues related to GERD.  She is here today with generalized abdominal complaints.  She says that about a month ago she was having issues with alternating bowel habits, bloating, generalized abdominal discomfort.  She says that her BMs were "wonky" with some diarrhea and some constipation.  Said she had COVID in July and her husband was actually hospitalized in the ICU with it.  She says that now she feels like she cannot move her bowels well.  She is currently only taking 1 stool softener a day and a vegetable laxative a couple of times a week.  She takes probiotics regularly.  She is taking Levsin as needed.  She says that now she is feeling better other than not being able to move her bowels extremely well.  She has had 2 normal colonoscopies, last in 2017.  She says that she has always assumed she probably has a diagnosis of IBS.  No certain foods seem to trigger her symptoms.  Thyroid studies followed regularly since she is on medication.  She had been on pantoprazole 40 mg daily and her PCP recently, about a week ago, increased it to twice a day.  She has a chronic cough that she has been struggling with for about 3 years.  She also reports hoarseness.  She has seen ENT in the past.  She had an EGD in 2020 that was essentially normal without any signs of esophagitis.   Past Medical History:  Diagnosis Date   Allergic rhinitis    Allergic rhinitis due to pollen 12/03/2019   Anemia    Asthma    Chest pain, unspecified 04/07/2014   Chicken pox    Colon polyp    Diverticulosis    Dysphagia    Dysrhythmia    Palpitations   Fibromyalgia    Food allergy    Gallstone    GERD (gastroesophageal reflux disease)    Hiatal hernia    Hypercholesteremia    Hypertension    Hypothyroidism     IBS (irritable bowel syndrome)    Migraine headache    occasionally   Osteoarthritis    Osteopenia    Pneumonia    PONV (postoperative nausea and vomiting)    Pulmonary hypertension ([place])    pt denies, pt states she was tested for it but was not diagnosed with it   Rectal bleeding    SBO (small bowel obstruction) ([place]) 10/20/2017   Seasonal allergies    Thyroid nodule    Past Surgical History:  Procedure Laterality Date   APPENDECTOMY  1982   BREAST EXCISIONAL BIOPSY Right 1995   benign   BREAST LUMPECTOMY  1991   CARPAL TUNNEL RELEASE Right 2006   Right wrist   CESAREAN SECTION     x 2   CHOLECYSTECTOMY N/A 07/25/2020   Procedure: LAPAROSCOPIC CHOLECYSTECTOMY;  Surgeon: [name], MD;  Location: [place];  Service: General;  Laterality: N/A;   COLON RESECTION  1990   COLONOSCOPY     CT ABDOMEN PELVIS W ([place] HX)  01/03/2020   IMAGE MRI brain:  04/2012   No focal IAC or inner ear lesion to explain hearing loss. Slightly greater than expected number of subcortical T2- hyperintensities bilaterally.  These are nonspecific.  They can be seen  in the setting of chronic migraine headaches, demyelinating process, chronic microvascular ischemic, prior infection or inflammation, or vasculitis   IMAGE MRI lumbar:  05/01/2006   Mild central canal stenosis with facet arthropathy and mildly bulging disc at L4-5.  There is mild narrowing in the left lateral recess at this level.  No definite neural impingement.  Appearance at this level has not markedly Moderately severe facet arthropathy at L5-S1 with mild interval progression of a diffuse broad based disc bulge.  There is mild biforaminal narrowing.  Central canal is open   LAPAROSCOPIC ABDOMINAL EXPLORATION  2019   adhesion removal> caused bowel blockage    NASAL SEPTUM SURGERY  2004   TONSILLECTOMY  1965   TOTAL ABDOMINAL HYSTERECTOMY W/ BILATERAL SALPINGOOPHORECTOMY  1988   TUBAL LIGATION  1974   UPPER GASTROINTESTINAL ENDOSCOPY  2020     reports that she has never smoked. She has never used smokeless tobacco. She reports current alcohol use. She reports that she does not use drugs. family history includes Alcohol abuse in her brother and sister; Arthritis in her brother, brother, father, and mother; Asthma in her father; Brain cancer in her sister; Breast cancer in her cousin and maternal aunt; COPD in her brother, father, sister, sister, and sister; Cancer in her sister; Clotting disorder in her sister; Depression in her brother; Diabetes in her brother and father; Diabetes type II in her sister; Early death in her father, sister, and sister; Emphysema in her maternal aunt and maternal uncle; Hearing loss in her mother; Heart disease in her mother and sister; Hypercholesterolemia in her brother; Hyperlipidemia in her father and mother; Hypertension in her father, mother, and sister; Kidney disease in her mother; Lung cancer in her sister and another family member; Macular degeneration in her mother; Parkinson's disease in her father; Throat cancer in her brother; Thyroid cancer in her sister. Allergies  Allergen Reactions   Sulfamethoxazole-Trimethoprim Hives   Avelox [Moxifloxacin Hcl In Nacl]     Racing heart   Cephalexin Hives   Codeine Hives    Heart racing   Erythromycin Hives   Flagyl [Metronidazole]     Unknown reaction   Propranolol Hives   Sulfa Antibiotics Hives   Tetanus Toxoid Swelling    Reports a fever and headache with swelling.    Tramadol Hives   Statins Other (See Comments)    Myalgia       Outpatient Encounter Medications as of 07/04/2021  Medication Sig   albuterol (PROAIR HFA) 108 (90 Base) MCG/ACT inhaler Inhale 2 puffs into the lungs every 6 (six) hours as needed.   albuterol (PROVENTIL) (2.5 MG/3ML) 0.083% nebulizer solution Take 3 mLs (2.5 mg total) by nebulization every 6 (six) hours as needed.   ARMOUR THYROID 15 MG tablet Take 1 tablet by mouth in the AM Once a day   ARMOUR THYROID 30 MG  tablet Take 1 tablet by mouth in the AM once a day   Ascorbic Acid (VITAMIN C) 1000 MG tablet Take 3,000 mg by mouth daily.   atenolol (TENORMIN) 25 MG tablet Take 0.5-1 tablets (12.5-25 mg total) by mouth daily.   calcium carbonate (TUMS - DOSED IN MG ELEMENTAL CALCIUM) 500 MG chewable tablet Chew 3 tablets by mouth daily as needed for indigestion or heartburn.   Carboxymethylcellulose Sodium (THERATEARS OP) Place 1 drop into both eyes 2 (two) times daily.   Cholecalciferol (VITAMIN D) 125 MCG (5000 UT) CAPS Take 5,000 Units by mouth daily.    Coenzyme Q10 (  COQ-10) 100 MG CAPS Take 100 mg by mouth daily.   ezetimibe (ZETIA) 10 MG tablet Take 1 tablet (10 mg total) by mouth daily.   fexofenadine (ALLERGY RELIEF) 180 MG tablet Take 1 tablet (180 mg total) by mouth daily.   fluticasone (FLONASE) 50 MCG/ACT nasal spray Place 2 sprays into both nostrils daily.   folic acid (FOLVITE) 1 MG tablet SMARTSIG:2 Tablet(s) By Mouth   hydrochlorothiazide (MICROZIDE) 12.5 MG capsule Take 1 capsule (12.5 mg total) by mouth daily as needed.   hyoscyamine (LEVSIN) 0.125 MG tablet Take 1 tablet (0.125 mg total) by mouth every 6 (six) hours as needed.   levocetirizine (XYZAL) 5 MG tablet Take 1 tablet (5 mg total) by mouth every evening.   meloxicam (MOBIC) 15 MG tablet Take 1 tablet (15 mg total) by mouth daily.   mometasone (ELOCON) 0.1 % lotion Apply 1 application topically daily as needed (ear irritation).    montelukast (SINGULAIR) 10 MG tablet TAKE 1 TABLET BY MOUTH EVERYDAY AT BEDTIME   mupirocin ointment (BACTROBAN) 2 % Apply 1 application topically 2 (two) times daily.   ondansetron (ZOFRAN-ODT) 8 MG disintegrating tablet Take 1 tablet (8 mg total) by mouth every 8 (eight) hours as needed for nausea or vomiting.   pantoprazole (PROTONIX) 40 MG tablet Take 1 tablet (40 mg total) by mouth 2 (two) times daily.   Probiotic CAPS Take 1 capsule by mouth daily.   rosuvastatin (CRESTOR) 10 MG tablet Take 1 tablet  (10 mg total) by mouth 3 (three) times a week.   SYMBICORT 80-4.5 MCG/ACT inhaler INHALE TWO PUFFS INTO THE LUNGS IN THE MORNING AND AT BEDTIME   tiZANidine (ZANAFLEX) 4 MG tablet Take 1 tablet (4 mg total) by mouth every 6 (six) hours as needed for muscle spasms.   tretinoin (RETIN-A) 0.1 % cream Apply topically at bedtime.   Turmeric (QC TUMERIC COMPLEX PO) Take by mouth.   VITAMIN A PO Take 1 tablet by mouth daily.   vitamin E 1000 UNIT capsule Take 1,000 Units by mouth daily.   zinc gluconate 50 MG tablet Take 100 mg by mouth daily.   zolpidem (AMBIEN) 5 MG tablet Take 1 tablet (5 mg total) by mouth at bedtime. As needed   No facility-administered encounter medications on file as of 07/04/2021.     REVIEW OF SYSTEMS  : All other systems reviewed and negative except where noted in the History of Present Illness.   PHYSICAL EXAM: BP (!) 142/70   Pulse 61   Ht 4\' 10"  (1.473 m)   Wt 140 lb 8 oz (63.7 kg)   SpO2 98%   BMI 29.36 kg/m  General: Well developed white female in no acute distress Head: Normocephalic and atraumatic Eyes:  Sclerae anicteric, conjunctiva pink. Ears: Normal auditory acuity Lungs: Clear throughout to auscultation; no W/R/R. Heart: Regular rate and rhythm; no M/R/G. Abdomen: Soft, non-distended.  BS present.  Non-tender. Musculoskeletal: Symmetrical with no gross deformities  Skin: No lesions on visible extremities Extremities: No edema  Neurological: Alert oriented x 4, grossly non-focal Psychological:  Alert and cooperative. Normal mood and affect  ASSESSMENT AND PLAN: *Suspected IBS with recent flare of symptoms due to COVID and stressors of her husband being in ICU with COVID.  Symptoms now improved, but still having trouble regulating her bowel habits.  Is only taking 1 stool softener a day and vegetable laxative 2 or 3 times a week.  I have asked her to begin taking MiraLAX 1 capful mixed  in 8 ounces of liquid daily and Benefiber 2 teaspoons mixed in 8  ounces of liquid daily.  She can discontinue the stool softener.  Continue Levsin as needed. *Chronic cough/hoarseness/GERD: She has been on pantoprazole 40 mg daily.  PCP increased pantoprazole to twice a day about a week ago.  I will maximize her therapy and add Pepcid 20 mg at bedtime.  Prescription sent to pharmacy.  She has had EGD with no evidence of esophagitis in the past.  Has seen ENT in the past as well.  **She will call or message back in 4 to 6 weeks with an update on her symptoms.   CC:  Kuneff, Renee A, DO

## 2021-07-04 NOTE — Patient Instructions (Addendum)
If you are age 70 or older, your body mass index should be between 23-30. Your Body mass index is 29.36 kg/m. If this is out of the aforementioned range listed, please consider follow up with your Primary Care Provider. __________________________________________________________  The [place] [facility] providers would like to encourage you to use [facility] to communicate with providers for non-urgent requests or questions.  Due to long hold times on the telephone, sending your provider a message by [facility] may be a faster and more efficient way to get a response.  Please allow 48 business hours for a response.  Please remember that this is for non-urgent requests.   Continue Pantoprazole 40 mg  Add famotidine 40 mg 1 tablet before bed.  START Miralax 1 capful daily in 8 ounces of water or juice. START Benefiber 2 tsp daily in liquid (Separate from Miralax)  STOP using your stool softener   Send the office a My Chart message in 4-6 weeks with an update on how you are doing.  Thank you for entrusting me with your care and choosing [facility].  Alonza Bogus, PA-C

## 2021-07-05 NOTE — Progress Notes (Signed)
I agree with the above note, plan 

## 2021-07-19 ENCOUNTER — Other Ambulatory Visit: Payer: Self-pay | Admitting: Family Medicine

## 2021-07-30 ENCOUNTER — Telehealth: Payer: Self-pay

## 2021-07-30 DIAGNOSIS — R058 Other specified cough: Secondary | ICD-10-CM

## 2021-07-30 DIAGNOSIS — R053 Chronic cough: Secondary | ICD-10-CM

## 2021-07-30 NOTE — Telephone Encounter (Signed)
OK, referral ordered to Dr. Melvyn Novas for upper airway cough syndrome.  (FYI Dr. Raoul Pitch)

## 2021-07-30 NOTE — Telephone Encounter (Signed)
Please advise if okay to place referral.

## 2021-07-30 NOTE — Telephone Encounter (Signed)
Patient has had cough for over 3 months.  Patient was seen by Dr. Anitra Lauth on 06/27/21.  Dr. Anitra Lauth told patient that if she did not get any better that he would refer her to a pulmonologist that specializes in the type of cough she has.  Please call [phone].

## 2021-07-30 NOTE — Telephone Encounter (Signed)
FYI

## 2021-08-02 ENCOUNTER — Ambulatory Visit: Payer: Medicare HMO | Admitting: Internal Medicine

## 2021-08-02 ENCOUNTER — Encounter: Payer: Self-pay | Admitting: Internal Medicine

## 2021-08-02 ENCOUNTER — Other Ambulatory Visit: Payer: Self-pay

## 2021-08-02 DIAGNOSIS — R053 Chronic cough: Secondary | ICD-10-CM | POA: Diagnosis not present

## 2021-08-02 MED ORDER — PREDNISONE 10 MG PO TABS: ORAL_TABLET | ORAL | 0 refills | Status: DC

## 2021-08-02 MED ORDER — BUDESONIDE 0.25 MG/2ML IN SUSP: RESPIRATORY_TRACT | 12 refills | Status: DC

## 2021-08-02 NOTE — Progress Notes (Signed)
Rita Levine, female    DOB: Feb 07, 1951     MRN: [id number]   Brief patient profile:  72 yowf never smoker with onset of nasal congestion in her 76s s/p multiple allergy test/ sinus surgery Benjamine Mola /Phipps on daily rx with allegra / zyrtec / nasal sprays then started needing symbicort / prev  eval and Rx by Ward  referred to pulmonary clinic 08/02/2021 by Joseph Art Kuneff/Dr Carson  for cough daily since 2019 when sick in bed with pna   and with chest tightness since covid in July 2022     [place] allergy eval neg p says she'd taken two rounds of "allergy shots" which  both helped while living in [place] per ENT   Seen by Clance 2012 with nl spirometry, he thought most of her problems were from her ENT issues and directed her there.  History of Present Illness  08/02/2021  Pulmonary/ 1st office eval/Rita Levine  Chief Complaint  Patient presents with   Consult    Chronic cough after having pneumonia in 2019  Dyspnea:  not normally limited by breathing  Cough: p stirring each am starts up the cough min productive/occ wakes her up / quiets down at night / worse with meals esp small particles and perfumes / this am clear / very hoarse in am  Sleep: electric bed 30 degrees  SABA use: sym 80 and saba twice daily > symbicort makes her cough immediately p use  Last prednisone in July 2022 seemed to help  No obvious other patterns inday to day or daytime variability or assoc excess/ purulent sputum or mucus plugs or hemoptysis or cp or chest tightness, subjective wheeze or overt sinus or hb symptoms.   Sleepingsas above without nocturnal    exacerbation  of respiratory  c/o's or need for noct saba. Also denies any obvious fluctuation of symptoms with weather or environmental changes or other aggravating or alleviating factors except as outlined above   No unusual exposure hx or h/o childhood pna/ asthma or knowledge of premature birth.  Current Allergies, Complete Past Medical History, Past Surgical  History, Family History, and Social History were reviewed in [employer] record.  ROS  The following are not active complaints unless bolded Hoarseness, sore throat, dysphagia, dental problems, itching, sneezing,  nasal congestion or discharge of excess mucus or purulent secretions, ear ache,   fever, chills, sweats, unintended wt loss or wt gain, classically pleuritic or exertional cp,  orthopnea pnd or arm/hand swelling  or leg swelling, presyncope, palpitations, abdominal pain, anorexia, nausea, vomiting, diarrhea  or change in bowel habits or change in bladder habits, change in stools or change in urine, dysuria, hematuria,  rash, arthralgias, visual complaints, headache, numbness, weakness or ataxia or problems with walking or coordination,  change in mood or  memory.                Past Medical History:  Diagnosis Date   Allergic rhinitis    Allergic rhinitis due to pollen 12/03/2019   Anemia    Asthma    Chest pain, unspecified 04/07/2014   Chicken pox    Colon polyp    Diverticulosis    Dysphagia    Dysrhythmia    Palpitations   Fibromyalgia    Food allergy    Gallstone    GERD (gastroesophageal reflux disease)    Hiatal hernia    Hypercholesteremia    Hypertension    Hypothyroidism    IBS (irritable bowel syndrome)  Migraine headache    occasionally   Osteoarthritis    Osteopenia    Pneumonia    PONV (postoperative nausea and vomiting)    Pulmonary hypertension ([place])    pt denies, pt states she was tested for it but was not diagnosed with it   Rectal bleeding    SBO (small bowel obstruction) ([place]) 10/20/2017   Seasonal allergies    Thyroid nodule     Outpatient Medications Prior to Visit  Medication Sig Dispense Refill   albuterol (PROAIR HFA) 108 (90 Base) MCG/ACT inhaler Inhale 2 puffs into the lungs every 6 (six) hours as needed. 6.7 g 5   albuterol (PROVENTIL) (2.5 MG/3ML) 0.083% nebulizer solution Take 3 mLs (2.5 mg total) by  nebulization every 6 (six) hours as needed. 75 mL 0   ARMOUR THYROID 15 MG tablet Take 1 tablet by mouth in the AM Once a day 90 tablet 3   ARMOUR THYROID 30 MG tablet Take 1 tablet by mouth in the AM once a day 90 tablet 3   Ascorbic Acid (VITAMIN C) 1000 MG tablet Take 3,000 mg by mouth daily.     atenolol (TENORMIN) 25 MG tablet Take 0.5-1 tablets (12.5-25 mg total) by mouth daily. 90 tablet 1   calcium carbonate (TUMS - DOSED IN MG ELEMENTAL CALCIUM) 500 MG chewable tablet Chew 3 tablets by mouth daily as needed for indigestion or heartburn.     Carboxymethylcellulose Sodium (THERATEARS OP) Place 1 drop into both eyes 2 (two) times daily.     Cholecalciferol (VITAMIN D) 125 MCG (5000 UT) CAPS Take 5,000 Units by mouth daily.      Coenzyme Q10 (COQ-10) 100 MG CAPS Take 100 mg by mouth daily.     ezetimibe (ZETIA) 10 MG tablet Take 1 tablet (10 mg total) by mouth daily. 90 tablet 3   famotidine (PEPCID) 20 MG tablet Take 1 tablet (20 mg total) by mouth at bedtime. 30 tablet 4   fexofenadine (ALLERGY RELIEF) 180 MG tablet Take 1 tablet (180 mg total) by mouth daily. 90 tablet 1   fluticasone (FLONASE) 50 MCG/ACT nasal spray Place 2 sprays into both nostrils daily. 16 mL 11   folic acid (FOLVITE) 1 MG tablet SMARTSIG:2 Tablet(s) By Mouth     hydrochlorothiazide (MICROZIDE) 12.5 MG capsule Take 1 capsule (12.5 mg total) by mouth daily as needed. 45 capsule 1   hyoscyamine (LEVSIN) 0.125 MG tablet Take 1 tablet (0.125 mg total) by mouth every 6 (six) hours as needed. 120 tablet 5   levocetirizine (XYZAL) 5 MG tablet Take 1 tablet (5 mg total) by mouth every evening. 90 tablet 3   meloxicam (MOBIC) 15 MG tablet Take 1 tablet (15 mg total) by mouth daily. 90 tablet 1   mometasone (ELOCON) 0.1 % lotion Apply 1 application topically daily as needed (ear irritation).      montelukast (SINGULAIR) 10 MG tablet TAKE 1 TABLET BY MOUTH EVERYDAY AT BEDTIME 90 tablet 1   mupirocin ointment (BACTROBAN) 2 %  Apply 1 application topically 2 (two) times daily. 30 g 2   ondansetron (ZOFRAN-ODT) 8 MG disintegrating tablet Take 1 tablet (8 mg total) by mouth every 8 (eight) hours as needed for nausea or vomiting. 30 tablet 1   pantoprazole (PROTONIX) 40 MG tablet Take 1 tablet (40 mg total) by mouth 2 (two) times daily. 60 tablet 0   Probiotic CAPS Take 1 capsule by mouth daily.     rosuvastatin (CRESTOR) 10 MG tablet Take 1  tablet (10 mg total) by mouth 3 (three) times a week. 40 tablet 3   SYMBICORT 80-4.5 MCG/ACT inhaler INHALE TWO PUFFS INTO THE LUNGS IN THE MORNING AND AT BEDTIME 30.6 g 11   tiZANidine (ZANAFLEX) 4 MG tablet Take 1 tablet (4 mg total) by mouth every 6 (six) hours as needed for muscle spasms. 120 tablet 5   Turmeric (QC TUMERIC COMPLEX PO) Take by mouth.     VITAMIN A PO Take 1 tablet by mouth daily.     zinc gluconate 50 MG tablet Take 100 mg by mouth daily.     zolpidem (AMBIEN) 5 MG tablet Take 1 tablet (5 mg total) by mouth at bedtime. As needed 90 tablet 1   tretinoin (RETIN-A) 0.1 % cream Apply topically at bedtime. (Patient not taking: Reported on 08/02/2021) 45 g 11   vitamin E 1000 UNIT capsule Take 1,000 Units by mouth daily. (Patient not taking: Reported on 08/02/2021)     No facility-administered medications prior to visit.     Objective:     BP 130/72 (BP Location: Left Arm, Cuff Size: Normal)   Pulse (!) 56   Temp 98.3 F (36.8 C)   Ht 4\' 10"  (1.473 m)   Wt 139 lb (63 kg)   SpO2 99% Comment: RA  BMI 29.05 kg/m   SpO2: 99 % (RA)  Slt hoarse / cough p symbicort but no cough on insp/ exp   HEENT : pt wearing mask not removed for exam due to covid -19 concerns.    NECK :  without JVD/Nodes/TM/ nl carotid upstrokes bilaterally   LUNGS: no acc muscle use,  Nl contour chest which is clear to A and P bilaterally without cough on insp or exp maneuvers   CV:  RRR  no s3 or murmur or increase in P2, and no edema   ABD:  soft and nontender with nl  inspiratory excursion in the supine position. No bruits or organomegaly appreciated, bowel sounds nl  MS:  Nl gait/ ext warm without deformities, calf tenderness, cyanosis or clubbing No obvious joint restrictions   SKIN: warm and dry without lesions    NEURO:  alert, approp, nl sensorium with  no motor or cerebellar deficits apparent.     I personally reviewed images and agree with radiology impression as follows:  CXR:   portable 04/28/21  No radiographic evidence of acute cardiopulmonary disease.     Assessment   Chronic cough Refractory since around 2019 and made worse p using symbicort 80 assoc with hoarseness   Most likely her present cough is not being driven by asthma as is made worse by using symbicort and does not wake her at night which is more typical of Upper airway cough syndrome (previously labeled PNDS),  is so named because it's frequently impossible to sort out how much is  CR/sinusitis with freq throat clearing (which can be related to primary GERD)   vs  causing  secondary (" extra esophageal")  GERD from wide swings in gastric pressure that occur with throat clearing, often  promoting self use of mint and menthol lozenges that reduce the lower esophageal sphincter tone and exacerbate the problem further in a cyclical fashion.   These are the same pts (now being labeled as having "irritable larynx syndrome" by some cough centers) who not infrequently have a history of having failed to tolerate ace inhibitors,  dry powder inhalers(and even sometimes low dose HFA ICS)  or biphosphonates or report having atypical/extraesophageal  reflux symptoms that don't respond to standard doses of PPI  and are easily confused as having aecopd or asthma flares by even experienced allergists/ pulmonologists (myself included).    Since breathing/ ex tol are not the issue but the cough is rec stop symbicort on a trial basis and rx aggressively for GERD while giving her alb/budesonide  combination per neb up to twice daily and f/u in 6 weeks.  >>> also so added 6 day taper off  Prednisone starting at 40 mg per day in case of component of Th-2 driven upper or lower airways inflammation (if cough responds short term only to relapse before return while will on full rx for uacs (as above), then  that would point to allergic rhinitis/ asthma or eos bronchitis as alternative dx)   Discussed in detail all the  indications, usual  risks and alternatives  relative to the benefits with patient who agrees to proceed with Rx as outlined.             Each maintenance medication was reviewed in detail including emphasizing most importantly the difference between maintenance and prns and under what circumstances the prns are to be triggered using an action plan format where appropriate.  Total time for H and P, chart review, counseling, reviewing hfa/neb device(s) and generating customized AVS unique to this office visit / same day charting = 35 min           Christinia Gully, MD 08/02/2021

## 2021-08-02 NOTE — Patient Instructions (Addendum)
Pantoprazole  40 mg Take 30- 60 min before your first and last meals of the day   Continue the famotidine 20 mg in evening   GERD (REFLUX)  is an extremely common cause of respiratory symptoms just like yours , many times with no obvious heartburn at all.   It can be treated with medication, but also with lifestyle changes including elevation of the head of your bed (ideally with 6 -8inch blocks under the headboard of your bed),  Smoking cessation, avoidance of late meals, excessive alcohol, and avoid fatty foods, chocolate, peppermint, colas, red wine, and acidic juices such as orange juice.  NO MINT OR MENTHOL PRODUCTS SO NO COUGH DROPS  USE SUGARLESS CANDY INSTEAD (Jolley ranchers or Stover's or Life Savers) or even ice chips will also do - the key is to swallow to prevent all throat clearing. NO OIL BASED VITAMINS - use powdered substitutes.  Avoid fish oil when coughing.   Stop symbicort   Prednisone 10 mg take  4 each am x 2 days,   2 each am x 2 days,  1 each am x 2 days and stop   If needed for breathing > nebulizer albuterol with budesonide  0.25 mg twice daily   Please schedule a follow up office visit in 6 weeks, call sooner if needed

## 2021-08-03 ENCOUNTER — Encounter: Payer: Self-pay | Admitting: Internal Medicine

## 2021-08-03 NOTE — Assessment & Plan Note (Signed)
Refractory since around 2019 and made worse p using symbicort 80 assoc with hoarseness   Most likely her present cough is not being driven by asthma as is made worse by using symbicort and does not wake her at night which is more typical of Upper airway cough syndrome (previously labeled PNDS),  is so named because it's frequently impossible to sort out how much is  CR/sinusitis with freq throat clearing (which can be related to primary GERD)   vs  causing  secondary (" extra esophageal")  GERD from wide swings in gastric pressure that occur with throat clearing, often  promoting self use of mint and menthol lozenges that reduce the lower esophageal sphincter tone and exacerbate the problem further in a cyclical fashion.   These are the same pts (now being labeled as having "irritable larynx syndrome" by some cough centers) who not infrequently have a history of having failed to tolerate ace inhibitors,  dry powder inhalers(and even sometimes low dose HFA ICS)  or biphosphonates or report having atypical/extraesophageal reflux symptoms that don't respond to standard doses of PPI  and are easily confused as having aecopd or asthma flares by even experienced allergists/ pulmonologists (myself included).    Since breathing/ ex tol are not the issue but the cough is rec stop symbicort on a trial basis and rx aggressively for GERD while giving her alb/budesonide combination per neb up to twice daily and f/u in 6 weeks.  >>> also so added 6 day taper off  Prednisone starting at 40 mg per day in case of component of Th-2 driven upper or lower airways inflammation (if cough responds short term only to relapse before return while will on full rx for uacs (as above), then  that would point to allergic rhinitis/ asthma or eos bronchitis as alternative dx)   Discussed in detail all the  indications, usual  risks and alternatives  relative to the benefits with patient who agrees to proceed with Rx as outlined.              Each maintenance medication was reviewed in detail including emphasizing most importantly the difference between maintenance and prns and under what circumstances the prns are to be triggered using an action plan format where appropriate.  Total time for H and P, chart review, counseling, reviewing hfa/neb device(s) and generating customized AVS unique to this office visit / same day charting = 35 min

## 2021-08-06 DIAGNOSIS — J309 Allergic rhinitis, unspecified: Secondary | ICD-10-CM | POA: Diagnosis not present

## 2021-08-06 DIAGNOSIS — J310 Chronic rhinitis: Secondary | ICD-10-CM | POA: Diagnosis not present

## 2021-08-06 DIAGNOSIS — K140 Glossitis: Secondary | ICD-10-CM | POA: Diagnosis not present

## 2021-08-06 DIAGNOSIS — K148 Other diseases of tongue: Secondary | ICD-10-CM | POA: Diagnosis not present

## 2021-08-07 ENCOUNTER — Ambulatory Visit (INDEPENDENT_AMBULATORY_CARE_PROVIDER_SITE_OTHER): Payer: Medicare HMO

## 2021-08-07 ENCOUNTER — Ambulatory Visit (INDEPENDENT_AMBULATORY_CARE_PROVIDER_SITE_OTHER): Payer: Medicare HMO | Admitting: Sports Medicine

## 2021-08-07 ENCOUNTER — Other Ambulatory Visit: Payer: Self-pay

## 2021-08-07 DIAGNOSIS — G5761 Lesion of plantar nerve, right lower limb: Secondary | ICD-10-CM | POA: Insufficient documentation

## 2021-08-07 DIAGNOSIS — M79671 Pain in right foot: Secondary | ICD-10-CM | POA: Diagnosis not present

## 2021-08-07 HISTORY — DX: Lesion of plantar nerve, right lower limb: G57.61

## 2021-08-07 NOTE — Progress Notes (Signed)
    Procedures performed today:    Procedure: Real-time Ultrasound Guided injection of the right 2/3 intermetatarsal bursa Device: Samsung HS60  Verbal informed consent obtained.  Time-out conducted.  Noted no overlying erythema, induration, or other signs of local infection.  Skin prepped in a sterile fashion.  Local anesthesia: Topical Ethyl chloride.  With sterile technique and under real time ultrasound guidance: Noted normal-appearing intermetatarsal bursa, 1 cc kenalog 40, 1 cc lidocaine injected easily. Completed without difficulty  Advised to call if fevers/chills, erythema, induration, drainage, or persistent bleeding.  Images permanently stored and available for review in PACS.  Impression: Technically successful ultrasound guided injection.  Independent interpretation of notes and tests performed by another provider:   None.  Brief History, Exam, Impression, and Recommendations:    Foot pain, right Rita Levine is a pleasant 70 year old female, she is long history of foot pain, bilateral, right worse than left localized between the second and third metatarsal heads, sound like she has had a couple of diagnoses of intermetatarsal neuromas, she had an injection in the intermetatarsal bursa which seems to have helped in the past, I am happy to do this injection today, however I would like to get her set up with custom molded orthotics with a metatarsal/neuroma pad to prevent recurrence. Return to see me 1 month after orthotics.    ___________________________________________ Rita Levine, M.D., ABFM., CAQSM. Primary Care and [employer] Instructor of [place] of [facility] of Medicine

## 2021-08-07 NOTE — Assessment & Plan Note (Signed)
Rita Levine is a pleasant 70 year old female, she is long history of foot pain, bilateral, right worse than left localized between the second and third metatarsal heads, sound like she has had a couple of diagnoses of intermetatarsal neuromas, she had an injection in the intermetatarsal bursa which seems to have helped in the past, I am happy to do this injection today, however I would like to get her set up with custom molded orthotics with a metatarsal/neuroma pad to prevent recurrence. Return to see me 1 month after orthotics.

## 2021-08-08 ENCOUNTER — Encounter: Payer: Self-pay | Admitting: Family Medicine

## 2021-08-08 ENCOUNTER — Ambulatory Visit: Payer: Medicare HMO | Admitting: Family Medicine

## 2021-08-08 VITALS — Ht <= 58 in | Wt 139.0 lb

## 2021-08-08 DIAGNOSIS — G5761 Lesion of plantar nerve, right lower limb: Secondary | ICD-10-CM | POA: Diagnosis not present

## 2021-08-08 NOTE — Assessment & Plan Note (Signed)
She does have loss of transverse arch with pain most consistent with a neuroma. -Counseled on home exercise therapy and supportive care. -Orthotics today. -Tried a neuroma pad and metatarsal pad with too much irritation currently.  We will try at a later date.

## 2021-08-08 NOTE — Progress Notes (Signed)
Rita Levine - 70 y.o. female MRN [id number]  Date of birth: 18-Sep-1951  SUBJECTIVE:  Including CC & ROS.  No chief complaint on file.   Rita Levine is a 70 y.o. female that is presenting with acute right foot pain.  Has a history of Morton's neuroma in her left and right foot.  Has recently received an injection.   Review of Systems See HPI   HISTORY: Past Medical, Surgical, Social, and Family History Reviewed & Updated per EMR.   Pertinent Historical Findings include:  Past Medical History:  Diagnosis Date   Allergic rhinitis    Allergic rhinitis due to pollen 12/03/2019   Anemia    Asthma    Chest pain, unspecified 04/07/2014   Chicken pox    Colon polyp    Diverticulosis    Dysphagia    Dysrhythmia    Palpitations   Fibromyalgia    Food allergy    Gallstone    GERD (gastroesophageal reflux disease)    Hiatal hernia    Hypercholesteremia    Hypertension    Hypothyroidism    IBS (irritable bowel syndrome)    Migraine headache    occasionally   Osteoarthritis    Osteopenia    Pneumonia    PONV (postoperative nausea and vomiting)    Pulmonary hypertension ([place])    pt denies, pt states she was tested for it but was not diagnosed with it   Rectal bleeding    SBO (small bowel obstruction) ([place]) 10/20/2017   Seasonal allergies    Thyroid nodule     Past Surgical History:  Procedure Laterality Date   APPENDECTOMY  1982   BREAST EXCISIONAL BIOPSY Right 1995   benign   BREAST LUMPECTOMY  1991   CARPAL TUNNEL RELEASE Right 2006   Right wrist   CESAREAN SECTION     x 2   CHOLECYSTECTOMY N/A 07/25/2020   Procedure: LAPAROSCOPIC CHOLECYSTECTOMY;  Surgeon: [name], MD;  Location: [facility];  Service: General;  Laterality: N/A;   COLON RESECTION  1990   COLONOSCOPY     CT ABDOMEN PELVIS W ([facility] HX)  01/03/2020   IMAGE MRI brain:  04/2012   No focal IAC or inner ear lesion to explain hearing loss. Slightly greater than expected number of subcortical T2-  hyperintensities bilaterally.  These are nonspecific.  They can be seen in the setting of chronic migraine headaches, demyelinating process, chronic microvascular ischemic, prior infection or inflammation, or vasculitis   IMAGE MRI lumbar:  05/01/2006   Mild central canal stenosis with facet arthropathy and mildly bulging disc at L4-5.  There is mild narrowing in the left lateral recess at this level.  No definite neural impingement.  Appearance at this level has not markedly Moderately severe facet arthropathy at L5-S1 with mild interval progression of a diffuse broad based disc bulge.  There is mild biforaminal narrowing.  Central canal is open   LAPAROSCOPIC ABDOMINAL EXPLORATION  2019   adhesion removal> caused bowel blockage    NASAL SEPTUM SURGERY  2004   TONSILLECTOMY  1965   TOTAL ABDOMINAL HYSTERECTOMY W/ BILATERAL SALPINGOOPHORECTOMY  1988   TUBAL LIGATION  1974   UPPER GASTROINTESTINAL ENDOSCOPY  2020    Family History  Problem Relation Age of Onset   Hyperlipidemia Father    Hypertension Father    Arthritis Father    Diabetes Father    Asthma Father    COPD Father    Early death Father  Parkinson's disease Father    Hypertension Mother    Hyperlipidemia Mother    Macular degeneration Mother    Arthritis Mother    Hearing loss Mother    Heart disease Mother    Kidney disease Mother    Throat cancer Brother        lung, and tongue   Arthritis Brother    COPD Brother    Diabetes type II Sister    COPD Sister    Heart disease Sister    Hypertension Sister    Thyroid cancer Sister    Lung cancer Sister    Early death Sister    COPD Sister    Breast cancer Maternal Aunt    Lung cancer Other        uncle   Emphysema Maternal Aunt    Emphysema Maternal Uncle    Clotting disorder Sister    Brain cancer Sister        brain tumor- not malignant   Breast cancer Cousin    Cancer Sister    Alcohol abuse Sister    COPD Sister    Early death Sister    Alcohol abuse  Brother    Arthritis Brother    Depression Brother    Diabetes Brother    Hypercholesterolemia Brother    Colon cancer Neg Hx    Esophageal cancer Neg Hx    Rectal cancer Neg Hx    Stomach cancer Neg Hx     Social History   Socioeconomic History   Marital status: Married    Spouse name: Windy Canny   Number of children: 2   Years of education: Not on file   Highest education level: Not on file  Occupational History   Occupation: homemaker  Tobacco Use   Smoking status: Never   Smokeless tobacco: Never  Vaping Use   Vaping Use: Never used  Substance and Sexual Activity   Alcohol use: Yes    Alcohol/week: 0.0 standard drinks    Comment: occasionally   Drug use: No   Sexual activity: Yes    Partners: Male    Comment: married  Other Topics Concern   Not on file  Social History Narrative   Marital status/children/pets: married, 2 children   Education/employment: HS grad. retired   Engineer, materials:      -smoke alarm in the home:Yes     - wears seatbelt: Yes     - Feels safe in their relationships: Yes   Social Determinants of Health   Financial Resource Strain: Low Risk    Difficulty of Paying Living Expenses: Not hard at all  Food Insecurity: No Food Insecurity   Worried About Charity fundraiser in the Last Year: Never true   [place] in the Last Year: Never true  Transportation Needs: No Transportation Needs   Lack of Transportation (Medical): No   Lack of Transportation (Non-Medical): No  Physical Activity: Sufficiently Active   Days of Exercise per Week: 5 days   Minutes of Exercise per Session: 30 min  Stress: No Stress Concern Present   Feeling of Stress : Not at all  Social Connections: Socially Integrated   Frequency of Communication with Friends and Family: More than three times a week   Frequency of Social Gatherings with Friends and Family: More than three times a week   Attends Religious Services: More than 4 times per year   Active Member of [employer]  or Organizations: Yes   Attends Club or  Organization Meetings: More than 4 times per year   Marital Status: Married  Human resources officer Violence: Not At Risk   Fear of Current or Ex-Partner: No   Emotionally Abused: No   Physically Abused: No   Sexually Abused: No     PHYSICAL EXAM:  VS: Ht 4\' 10"  (1.473 m)   Wt 139 lb (63 kg)   BMI 29.05 kg/m  Physical Exam Gen: NAD, alert, cooperative with exam, well-appearing   Patient was fitted for a standard, cushioned, semi-rigid orthotic. The orthotic was heated and afterward the patient stood on the orthotic blank positioned on the orthotic stand. The patient was positioned in subtalar neutral position and 10 degrees of ankle dorsiflexion in a weight bearing stance. After completion of molding, a stable base was applied to the orthotic blank. The blank was ground to a stable position for weight bearing. Size: 6 Pairs: 2 Base: Blue EVA Additional Posting and Padding: None The patient ambulated these, and they were very comfortable.    ASSESSMENT & PLAN:   [place] Lathe neuroma, right She does have loss of transverse arch with pain most consistent with a neuroma. -Counseled on home exercise therapy and supportive care. -Orthotics today. -Tried a neuroma pad and metatarsal pad with too much irritation currently.  We will try at a later date.

## 2021-08-16 ENCOUNTER — Other Ambulatory Visit: Payer: Self-pay | Admitting: Family Medicine

## 2021-09-12 ENCOUNTER — Telehealth: Payer: Self-pay | Admitting: Family Medicine

## 2021-09-12 NOTE — Progress Notes (Signed)
@Patient  ID: Rita Levine, female    DOB: 03/13/1951, 70 y.o.   MRN: [id number]  No chief complaint on file.   Referring provider: Ma Hillock, DO  HPI: 70 year old female, never smoker followed for asthma, chronic cough.  She is a patient of Dr. Gustavus Bryant and was last seen in office on 08/02/2021.  Past medical history significant for hypertension, aortic arthrosclerosis, CAD, pulmonary hypertension, GERD, IBS, hypothyroidism, HLD, fibromyalgia, allergic rhinitis.  TEST/EVENTS:  04/28/2021 CXR 1 view: Lung volumes are normal.  No consolidative airspace disease.  No pleural effusions.  No pneumothorax.  No pulmonary nodule or mass noted.  Pulmonary vasculature and the cardiomediastinal silhouette are within normal limits.  08/02/2021: OV with Dr. Melvyn Novas.  Initial consult.  Reports chronic cough since having pneumonia in 2019.  No reports of dyspnea.  Currently on Symbicort 80 and using Saba twice a day.  Reports that cough is worse after using Symbicort and she has associated hoarseness.  Suspected upper airway cough syndrome.  Recommended stopping Symbicort on trial basis.  Aggressive treatment GERD with Protonix 40 mg twice a day and Pepcid 20 mg in the evening.  Albuterol and budesonide nebulizers up to twice a day if needed.  6-day taper of prednisone.  Follow-up 6 weeks.  09/12/2021: Today - 6 week follow up  Patient presents today for 6 week follow up after stopping Symbicort and changing to budesonide/albuterol nebs twice a day. She reports she did not notice a major change in her symptoms and has had an increase in her shortness of breath and chest tightness after burning some trash in the yard and working in her attic earlier this week.  She does report feeling better today although her symptoms have not returned to baseline.  She is still experiencing a persistent cough with primarily clear sputum production.  She has continued on her budesonide 0.25 mg and albuterol nebs Twice daily. Her  allergy symptoms are still uncontrolled and are currently being treated by ENT. She does nasal irrigation treatments twice a day with added budesonide and albuterol as well as Bactroban ointment. She continues on her Xyzal and Singulair as well. She denies orthopnea, PND, chest pain, wheezing, or lower extremity swelling. Her GERD symptoms are well controlled on current regimen of Protonix Twice daily and pepcid daily.   Allergies  Allergen Reactions   Sulfamethoxazole-Trimethoprim Hives   Avelox [Moxifloxacin Hcl In Nacl]     Racing heart   Cephalexin Hives   Codeine Hives    Heart racing   Erythromycin Hives   Flagyl [Metronidazole]     Unknown reaction   Propranolol Hives   Sulfa Antibiotics Hives   Tetanus Toxoid Swelling    Reports a fever and headache with swelling.    Tramadol Hives   Statins Other (See Comments)    Myalgia     Immunization History  Administered Date(s) Administered   Influenza-Unspecified 06/11/2019, 07/14/2020   Pneumococcal Conjugate-13 07/03/2016   Pneumococcal Polysaccharide-23 08/03/2019   Tdap 05/02/2014   Zoster, Live 10/14/2010    Past Medical History:  Diagnosis Date   Allergic rhinitis    Allergic rhinitis due to pollen 12/03/2019   Anemia    Asthma    Chest pain, unspecified 04/07/2014   Chicken pox    Colon polyp    Diverticulosis    Dysphagia    Dysrhythmia    Palpitations   Fibromyalgia    Food allergy    Gallstone    GERD (gastroesophageal reflux disease)  Hiatal hernia    Hypercholesteremia    Hypertension    Hypothyroidism    IBS (irritable bowel syndrome)    Migraine headache    occasionally   Osteoarthritis    Osteopenia    Pneumonia    PONV (postoperative nausea and vomiting)    Pulmonary hypertension ([place])    pt denies, pt states she was tested for it but was not diagnosed with it   Rectal bleeding    SBO (small bowel obstruction) ([place]) 10/20/2017   Seasonal allergies    Thyroid nodule     Tobacco  History: Social History   Tobacco Use  Smoking Status Never  Smokeless Tobacco Never   Counseling given: Not Answered   Outpatient Medications Prior to Visit  Medication Sig Dispense Refill   albuterol (PROAIR HFA) 108 (90 Base) MCG/ACT inhaler Inhale 2 puffs into the lungs every 6 (six) hours as needed. 6.7 g 5   albuterol (PROVENTIL) (2.5 MG/3ML) 0.083% nebulizer solution Take 3 mLs (2.5 mg total) by nebulization every 6 (six) hours as needed. 75 mL 0   ARMOUR THYROID 15 MG tablet Take 1 tablet by mouth in the AM Once a day 90 tablet 3   ARMOUR THYROID 30 MG tablet Take 1 tablet by mouth in the AM once a day 90 tablet 3   Ascorbic Acid (VITAMIN C) 1000 MG tablet Take 3,000 mg by mouth daily.     atenolol (TENORMIN) 25 MG tablet Take 0.5-1 tablets (12.5-25 mg total) by mouth daily. 90 tablet 1   budesonide (PULMICORT) 0.25 MG/2ML nebulizer solution One vial twice daily with albuterol 120 mL 12   calcium carbonate (TUMS - DOSED IN MG ELEMENTAL CALCIUM) 500 MG chewable tablet Chew 3 tablets by mouth daily as needed for indigestion or heartburn.     Carboxymethylcellulose Sodium (THERATEARS OP) Place 1 drop into both eyes 2 (two) times daily.     Cholecalciferol (VITAMIN D) 125 MCG (5000 UT) CAPS Take 5,000 Units by mouth daily.      Coenzyme Q10 (COQ-10) 100 MG CAPS Take 100 mg by mouth daily.     ezetimibe (ZETIA) 10 MG tablet Take 1 tablet (10 mg total) by mouth daily. 90 tablet 3   famotidine (PEPCID) 20 MG tablet Take 1 tablet (20 mg total) by mouth at bedtime. 30 tablet 4   fexofenadine (ALLERGY RELIEF) 180 MG tablet Take 1 tablet (180 mg total) by mouth daily. 90 tablet 1   fluticasone (FLONASE) 50 MCG/ACT nasal spray Place 2 sprays into both nostrils daily. 16 mL 11   folic acid (FOLVITE) 1 MG tablet SMARTSIG:2 Tablet(s) By Mouth     hydrochlorothiazide (MICROZIDE) 12.5 MG capsule Take 1 capsule (12.5 mg total) by mouth daily as needed. 45 capsule 1   hyoscyamine (LEVSIN) 0.125 MG  tablet Take 1 tablet (0.125 mg total) by mouth every 6 (six) hours as needed. 120 tablet 5   levocetirizine (XYZAL) 5 MG tablet Take 1 tablet (5 mg total) by mouth every evening. 90 tablet 3   meloxicam (MOBIC) 15 MG tablet Take 1 tablet (15 mg total) by mouth daily. 90 tablet 1   mometasone (ELOCON) 0.1 % lotion Apply 1 application topically daily as needed (ear irritation).      montelukast (SINGULAIR) 10 MG tablet TAKE 1 TABLET BY MOUTH EVERYDAY AT BEDTIME 90 tablet 1   mupirocin ointment (BACTROBAN) 2 % Apply 1 application topically 2 (two) times daily. 30 g 2   ondansetron (ZOFRAN-ODT) 8 MG disintegrating  tablet Take 1 tablet (8 mg total) by mouth every 8 (eight) hours as needed for nausea or vomiting. 30 tablet 1   pantoprazole (PROTONIX) 40 MG tablet Take 1 tablet (40 mg total) by mouth 2 (two) times daily. 60 tablet 0   Probiotic CAPS Take 1 capsule by mouth daily.     rosuvastatin (CRESTOR) 10 MG tablet Take 1 tablet (10 mg total) by mouth 3 (three) times a week. 40 tablet 3   tiZANidine (ZANAFLEX) 4 MG tablet Take 1 tablet (4 mg total) by mouth every 6 (six) hours as needed for muscle spasms. 120 tablet 5   Turmeric (QC TUMERIC COMPLEX PO) Take by mouth.     VITAMIN A PO Take 1 tablet by mouth daily.     zinc gluconate 50 MG tablet Take 100 mg by mouth daily.     zolpidem (AMBIEN) 5 MG tablet Take 1 tablet (5 mg total) by mouth at bedtime. As needed 90 tablet 1   predniSONE (DELTASONE) 10 MG tablet Take  4 each am x 2 days,   2 each am x 2 days,  1 each am x 2 days and stop 14 tablet 0   tretinoin (RETIN-A) 0.1 % cream Apply topically at bedtime. (Patient not taking: Reported on 08/02/2021) 45 g 11   vitamin E 1000 UNIT capsule Take 1,000 Units by mouth daily. (Patient not taking: Reported on 08/02/2021)     No facility-administered medications prior to visit.     Review of Systems:   Constitutional: No weight loss or gain, night sweats, fevers, chills, fatigue, or  lassitude. HEENT: +chronic sinusitis symptoms including congestion, rhinorrhea, and postnasal drip (followed by ENT). No headaches, difficulty swallowing, tooth/dental problems, or sore throat. No sneezing, itching, ear ache CV:  No chest pain, orthopnea, PND, swelling in lower extremities, anasarca, dizziness, palpitations, syncope Resp: +shortness of breath with exertion, productive cough with clear sputum (unchanged from baseline), chest tightness (slightly worse than baseline). No excess mucus or change in color of mucus. No hemoptysis. No wheezing.  No chest wall deformity GI:  No heartburn, indigestion, abdominal pain, nausea, vomiting, diarrhea, change in bowel habits, loss of appetite, bloody stools.  GU: No dysuria, change in color of urine, urgency or frequency.  No flank pain, no hematuria  Skin: No rash, lesions, ulcerations MSK:  No joint pain or swelling.  No decreased range of motion.  No back pain. Neuro: No dizziness or lightheadedness.  Psych: No depression or anxiety. Mood stable.     Physical Exam:  BP (!) 150/70 (BP Location: Left Arm, Patient Position: Sitting, Cuff Size: Normal)   Pulse 83   Temp 98.5 F (36.9 C) (Oral)   Ht 4' 10.5" (1.486 m)   Wt 136 lb (61.7 kg)   SpO2 98%   BMI 27.94 kg/m   GEN: Pleasant, interactive, well-nourished; in no acute distress. HEENT:  Normocephalic and atraumatic. EACs patent bilaterally. TM pearly gray with present light reflex bilaterally. PERRLA. Sclera white. Nasal turbinates pink, moist and patent bilaterally. Clear rhinorrhea present. Oropharynx erythematous and moist, without exudate or edema. No lesions, ulcerations NECK:  Supple w/ fair ROM. No JVD present. Normal carotid impulses w/o bruits. Thyroid symmetrical with no goiter or nodules palpated. No lymphadenopathy.   CV: RRR, no m/r/g, no peripheral edema. Pulses intact, +2 bilaterally. No cyanosis, pallor or clubbing. PULMONARY:  Unlabored, regular breathing. Clear  bilaterally A&P w/o wheezes/rales/rhonchi. No accessory muscle use. No dullness to percussion. GI: BS present and normoactive. Soft,  non-tender to palpation. No organomegaly or masses detected. No CVA tenderness. MSK: No erythema, warmth or tenderness. Cap refil <2 sec all extrem. No deformities or joint swelling noted.  Neuro: A/Ox3. No focal deficits noted.   Skin: Warm, no lesions or rashe Psych: Normal affect and behavior. Judgement and thought content appropriate.     Lab Results:  CBC    Component Value Date/Time   WBC 4.5 04/28/2021 0934   RBC 5.02 04/28/2021 0934   HGB 14.2 04/28/2021 0934   HCT 41.8 04/28/2021 0934   PLT 247 04/28/2021 0934   MCV 83.3 04/28/2021 0934   MCH 28.3 04/28/2021 0934   MCHC 34.0 04/28/2021 0934   RDW 13.9 04/28/2021 0934   LYMPHSABS 0.8 04/28/2021 0934   MONOABS 0.4 04/28/2021 0934   EOSABS 0.0 04/28/2021 0934   BASOSABS 0.0 04/28/2021 0934    BMET    Component Value Date/Time   NA 134 (L) 04/28/2021 0934   NA 138 01/04/2021 1001   K 4.3 04/28/2021 0934   CL 102 04/28/2021 0934   CO2 23 04/28/2021 0934   GLUCOSE 148 (H) 04/28/2021 0934   BUN 8 04/28/2021 0934   BUN 12 01/04/2021 1001   CREATININE 0.67 04/28/2021 0934   CREATININE 0.73 11/28/2020 1342   CALCIUM 9.0 04/28/2021 0934   GFRNONAA >60 04/28/2021 0934    BNP No results found for: BNP   Imaging:  No results found.    No flowsheet data found.  No results found for: NITRICOXIDE      Assessment & Plan:   Upper airway cough syndrome Minimal improvement since last visit with current flare related to environmental triggers. Increase ICS budesonide nebs to 0.5mg . May need to consider step up in therapy if symptom burden persists. 2 day prednisone taper for flare. Other maintenance medications continued. Instructed to follow up with ENT as scheduled. Previous spirometry normal in 2012 - symptoms suspected to be related to sinusitis and ENT problems. May need to  consider PFTs and FeNO for further evaluation.   Patient Instructions  -Continue albuterol 3 mL nebulizer Twice daily  -Increase budesonide to 0.5 mg/17mL twice daily, rinse mouth after -Continue albuterol inhaler 2 puffs every 6 hours as needed for shortness of breath or wheezing -Continue sinuglair 10 mg At bedtime -Continue protonix 40 mg Twice daily -Continue pepcid 10 mg daily  -Continue Xyzal 5 mg At bedtime  -Continue nasal irrigation treatments per ENT  -Prednisone taper pack. 4 tabs for 2 days, then 3 tabs for 2 days, 2 tabs for 2 days, then 1 tab for 2 days, then stop. Take in the morning with food.  Asthma Action Plan in place Rinse mouth after inhaled corticosteroid use.  Avoid triggers, when able.  Exercise encouraged. Notify if worsening symptoms upon exertion.  Notify and seek help if symptoms unrelieved by rescue inhaler.   Follow up with ENT as scheduled.   Follow up in 6 weeks with Dr. Melvyn Novas, Roxan Diesel, NP or APP. If symptoms do not improve or worsen, please contact office for sooner follow up or seek emergency care.    Chronic cough Possibly related to postnasal drip and upper airway cough syndrome vs hx of asthma. See above plan.   Seasonal allergies Managed by ENT. Continues to experience high symptom burden. See above plan.     Clayton Bibles, NP 09/13/2021  Pt aware and understands NP's role.

## 2021-09-12 NOTE — Telephone Encounter (Signed)
Pt transferred to [facility] with Nurse Triage.  Pt noted trouble breathing at 3am. Her rescue inhaler was no help. She used nebulizer & some help, pulse ox ok, feeling spasms in lungs, cough, no fever aches pains sore throat

## 2021-09-13 ENCOUNTER — Ambulatory Visit: Payer: Medicare HMO | Admitting: Nurse Practitioner

## 2021-09-13 ENCOUNTER — Other Ambulatory Visit: Payer: Self-pay

## 2021-09-13 ENCOUNTER — Encounter: Payer: Self-pay | Admitting: Nurse Practitioner

## 2021-09-13 VITALS — BP 150/70 | HR 83 | Temp 98.5°F | Ht 58.5 in | Wt 136.0 lb

## 2021-09-13 DIAGNOSIS — R0602 Shortness of breath: Secondary | ICD-10-CM

## 2021-09-13 DIAGNOSIS — R053 Chronic cough: Secondary | ICD-10-CM | POA: Diagnosis not present

## 2021-09-13 DIAGNOSIS — R058 Other specified cough: Secondary | ICD-10-CM | POA: Insufficient documentation

## 2021-09-13 DIAGNOSIS — J45909 Unspecified asthma, uncomplicated: Secondary | ICD-10-CM

## 2021-09-13 DIAGNOSIS — J302 Other seasonal allergic rhinitis: Secondary | ICD-10-CM

## 2021-09-13 MED ORDER — PREDNISONE 10 MG PO TABS: ORAL_TABLET | ORAL | 0 refills | Status: DC

## 2021-09-13 MED ORDER — BUDESONIDE 0.5 MG/2ML IN SUSP: 0.5000 mg | Freq: Two times a day (BID) | RESPIRATORY_TRACT | 12 refills | Status: DC

## 2021-09-13 NOTE — Assessment & Plan Note (Addendum)
Minimal improvement since last visit with current flare related to environmental triggers. Increase ICS budesonide nebs to 0.5mg . May need to consider step up in therapy if symptom burden persists. 2 day prednisone taper for flare. Other maintenance medications continued. Instructed to follow up with ENT as scheduled. Previous spirometry normal in 2012 - symptoms suspected to be related to sinusitis and ENT problems. May need to consider PFTs and FeNO for further evaluation.   Patient Instructions  -Continue albuterol 3 mL nebulizer Twice daily  -Increase budesonide to 0.5 mg/60mL twice daily, rinse mouth after -Continue albuterol inhaler 2 puffs every 6 hours as needed for shortness of breath or wheezing -Continue sinuglair 10 mg At bedtime -Continue protonix 40 mg Twice daily -Continue pepcid 10 mg daily  -Continue Xyzal 5 mg At bedtime  -Continue nasal irrigation treatments per ENT  -Prednisone taper pack. 4 tabs for 2 days, then 3 tabs for 2 days, 2 tabs for 2 days, then 1 tab for 2 days, then stop. Take in the morning with food.  Asthma Action Plan in place Rinse mouth after inhaled corticosteroid use.  Avoid triggers, when able.  Exercise encouraged. Notify if worsening symptoms upon exertion.  Notify and seek help if symptoms unrelieved by rescue inhaler.   Follow up with ENT as scheduled.   Follow up in 6 weeks with Dr. Melvyn Novas, Roxan Diesel, NP or APP. If symptoms do not improve or worsen, please contact office for sooner follow up or seek emergency care.

## 2021-09-13 NOTE — Patient Instructions (Addendum)
-  Continue albuterol 3 mL nebulizer Twice daily  -Increase budesonide to 0.5 mg/70mL twice daily, rinse mouth after -Continue albuterol inhaler 2 puffs every 6 hours as needed for shortness of breath or wheezing -Continue sinuglair 10 mg At bedtime -Continue protonix 40 mg Twice daily -Continue pepcid 10 mg daily  -Continue Xyzal 5 mg At bedtime  -Continue nasal irrigation treatments per ENT  -Prednisone taper pack. 4 tabs for 2 days, then 3 tabs for 2 days, 2 tabs for 2 days, then 1 tab for 2 days, then stop. Take in the morning with food.  Asthma Action Plan in place Rinse mouth after inhaled corticosteroid use.  Avoid triggers, when able.  Exercise encouraged. Notify if worsening symptoms upon exertion.  Notify and seek help if symptoms unrelieved by rescue inhaler.   Follow up with ENT as scheduled.   Follow up in 6 weeks with Dr. Melvyn Novas, Roxan Diesel, NP or APP. If symptoms do not improve or worsen, please contact office for sooner follow up or seek emergency care.

## 2021-09-13 NOTE — Assessment & Plan Note (Addendum)
Managed by ENT. Continues to experience high symptom burden. See above plan.

## 2021-09-13 NOTE — Assessment & Plan Note (Addendum)
Possibly related to postnasal drip and upper airway cough syndrome vs hx of asthma. See above plan.

## 2021-09-13 NOTE — Telephone Encounter (Addendum)
Pt has appt with [facility] today, given instructions regarding medications and advised to f/u with ENT and Dr.Wert(6 weeks).  [facility] - Client TELEPHONE ADVICE RECORD [facility] Patient Name: Rita Levine Gender: Female DOB: 02-12-1951 Age: 70 Y 68 M 26 D Return Phone Number: [phone] (Primary) Address: City/State/Zip: [place] [address] Client [facility] - Client Client Site [place] - Day Provider Raoul Pitch, [place] Type Call Who Is Calling Patient / Member / Family / Caregiver Call Type Triage / Clinical Relationship To Patient Self Return Phone Number [phone] (Primary) Chief Complaint BREATHING - shortness of breath or sounds breathless Reason for Call Symptomatic / Request for Health Information Initial Comment Woke up at 3am with trouble breathing, feeling she couldnt exhale all the way. Has cough, has used inhaler and nebulizer Translation No Nurse Assessment Nurse: Jimmye Norman, RN, [place] Date/Time (Eastern Time): 09/12/2021 10:57:31 AM Confirm and document reason for call. If symptomatic, describe symptoms. ---Woke up at 3am with trouble breathing, feeling she couldn't exhale all the way. Has cough, has used inhaler and nebulizer, seems to help tremendously. Does the patient have any new or worsening symptoms? ---Yes Will a triage be completed? ---Yes Related visit to physician within the last 2 weeks? ---No Does the PT have any chronic conditions? (i.e. diabetes, asthma, this includes High risk factors for pregnancy, etc.) ---Yes List chronic conditions. ---asthma Is this a behavioral health or substance abuse call? ---No Guidelines Guideline Title Affirmed Question Affirmed Notes Nurse Date/Time (Eastern Time) Asthma Attack High risk adult asthmatic (i.e., prior intubation for asthma, hospitalized this past year for asthma, frequent  steroid treatment) Jimmye Norman, RN, [place] 09/12/2021 10:58:23 AM PLEASE NOTE: All timestamps contained within this report are represented as [place] Standard Time. CONFIDENTIALTY NOTICE: This fax transmission is intended only for the addressee. It contains information that is legally privileged, confidential or otherwise protected from use or disclosure. If you are not the intended recipient, you are strictly prohibited from reviewing, disclosing, copying using or disseminating any of this information or taking any action in reliance on or regarding this information. If you have received this fax in error, please notify us immediately by telephone so that we can arrange for its return to [place]. Phone: [phone], Toll-Free: [phone], Fax: [phone] Page: 2 of 2 Call Id: [id number] [place]. Time Eilene Ghazi Time) Disposition Final User 09/12/2021 10:56:03 AM Send to Urgent Queue Donato Heinz 09/12/2021 11:02:44 AM See PCP within 24 Hours Yes Jimmye Norman, RN, Loree Fee Caller Disagree/Comply Comply Caller Understands Yes PreDisposition InappropriateToAsk Care Advice Given Per Guideline SEE PCP WITHIN 24 HOURS: * IF OFFICE WILL BE OPEN: You need to be examined within the next 24 hours. Call your doctor (or NP/PA) when the office opens and make an appointment. CALL BACK IF: * You become worse Referrals REFERRED TO PCP OFFICE

## 2021-09-20 ENCOUNTER — Ambulatory Visit: Payer: Medicare HMO | Admitting: Internal Medicine

## 2021-09-28 ENCOUNTER — Other Ambulatory Visit: Payer: Self-pay | Admitting: Family Medicine

## 2021-10-02 ENCOUNTER — Other Ambulatory Visit (HOSPITAL_BASED_OUTPATIENT_CLINIC_OR_DEPARTMENT_OTHER): Payer: Medicare HMO

## 2021-10-02 ENCOUNTER — Ambulatory Visit (HOSPITAL_BASED_OUTPATIENT_CLINIC_OR_DEPARTMENT_OTHER): Payer: Medicare HMO

## 2021-10-12 ENCOUNTER — Other Ambulatory Visit: Payer: Self-pay | Admitting: Family Medicine

## 2021-10-12 ENCOUNTER — Telehealth: Payer: Self-pay | Admitting: Family Medicine

## 2021-10-12 NOTE — Telephone Encounter (Signed)
Spoke with pt regarding rx. Pt was informed of tessalon pearls is not a medication that is normally refilled routinely but on a PRN bases. Pt states that she will talk to pulmonologist regarding medication. Pt was informed that the acyclovir is not on current med list now for almost 2 years and to keep appt to discuss with PCP.

## 2021-10-12 NOTE — Telephone Encounter (Signed)
Pt needing refill for   Acyclovir & bezonatate  Informed pt that she would need to schedule an appointment. Appointment scheduled for 11/06/2021. Pt said she will have dental surgery. She or her husband will call back to reschedule if need be.--KR   [id number] - [place], [place] Phone:  [phone]  Fax:  [phone]

## 2021-10-16 ENCOUNTER — Ambulatory Visit: Payer: Medicare HMO | Admitting: Physician Assistant

## 2021-10-25 ENCOUNTER — Ambulatory Visit: Payer: Medicare HMO | Admitting: Nurse Practitioner

## 2021-10-25 ENCOUNTER — Other Ambulatory Visit: Payer: Self-pay

## 2021-10-25 ENCOUNTER — Encounter: Payer: Self-pay | Admitting: Nurse Practitioner

## 2021-10-25 VITALS — BP 140/78 | HR 64 | Temp 98.0°F | Ht <= 58 in | Wt 142.0 lb

## 2021-10-25 DIAGNOSIS — J45909 Unspecified asthma, uncomplicated: Secondary | ICD-10-CM | POA: Diagnosis not present

## 2021-10-25 DIAGNOSIS — R058 Other specified cough: Secondary | ICD-10-CM

## 2021-10-25 DIAGNOSIS — J302 Other seasonal allergic rhinitis: Secondary | ICD-10-CM | POA: Diagnosis not present

## 2021-10-25 DIAGNOSIS — K219 Gastro-esophageal reflux disease without esophagitis: Secondary | ICD-10-CM

## 2021-10-25 LAB — CBC WITH DIFFERENTIAL/PLATELET
Basophils Absolute: 0.1 10*3/uL (ref 0.0–0.1)
Basophils Relative: 1 % (ref 0.0–3.0)
Eosinophils Absolute: 0.1 10*3/uL (ref 0.0–0.7)
Eosinophils Relative: 1.9 % (ref 0.0–5.0)
HCT: 39.5 % (ref 36.0–46.0)
Hemoglobin: 12.9 g/dL (ref 12.0–15.0)
Lymphocytes Relative: 19.3 % (ref 12.0–46.0)
Lymphs Abs: 1.4 10*3/uL (ref 0.7–4.0)
MCHC: 32.8 g/dL (ref 30.0–36.0)
MCV: 85.9 fl (ref 78.0–100.0)
Monocytes Absolute: 0.7 10*3/uL (ref 0.1–1.0)
Monocytes Relative: 10.3 % (ref 3.0–12.0)
Neutro Abs: 4.8 10*3/uL (ref 1.4–7.7)
Neutrophils Relative %: 67.5 % (ref 43.0–77.0)
Platelets: 253 10*3/uL (ref 150.0–400.0)
RBC: 4.59 Mil/uL (ref 3.87–5.11)
RDW: 13.3 % (ref 11.5–15.5)
WBC: 7.1 10*3/uL (ref 4.0–10.5)

## 2021-10-25 MED ORDER — BREZTRI AEROSPHERE 160-9-4.8 MCG/ACT IN AERO: 2.0000 | INHALATION_SPRAY | Freq: Two times a day (BID) | RESPIRATORY_TRACT | 0 refills | Status: DC

## 2021-10-25 NOTE — Assessment & Plan Note (Addendum)
FeNO 12 ppb today. Previous normal spirometry. No significant wheeze or environmental triggers noted. PFTs ordered. Trial triple therapy with Rita Levine

## 2021-10-25 NOTE — Patient Instructions (Addendum)
-  Continue albuterol inhaler 2 puffs or 3 mL nebulizer every 6 hours as needed for shortness of breath or cough -Stop budesonide neb -Continue sinuglair 10 mg At bedtime -Continue protonix 40 mg Twice daily -Continue pepcid 10 mg daily  -Continue Xyzal 5 mg At bedtime  -Continue nasal irrigation treatments per ENT   -Breztri 2 puffs Twice daily. Brush tongue and rinse mouth afterwards. Trial with samples. If improvement in symptoms, we can send in a rx to continue using.  -Mucinex DM Twice daily for cough and congestion -Chlortab 4 mg over the counter At bedtime for cough   Asthma Action Plan in place Rinse mouth after inhaled corticosteroid use.  Avoid triggers, when able.  Exercise encouraged. Notify if worsening symptoms upon exertion.  Notify and seek help if symptoms unrelieved by rescue inhaler.    Follow up with ENT as scheduled.   Contact GI for follow up to discuss breakthrough GERD. I have sent them a message as well.   Pulmonary function testing scheduled today.  Labs today - CBC with diff and allergen panel   Follow up after PFTs with Dr. Melvyn Novas. If symptoms do not improve or worsen, please contact office for sooner follow up or seek emergency care.

## 2021-10-25 NOTE — Assessment & Plan Note (Addendum)
Continue Twice daily PPI and pepcid At bedtime. Avoid GERD triggering foods. Continue to elevate HOB. Advised follow up with GI. Will forward message to them as well to discuss breakthrough GERD symptoms. Tiny hiatal hernia noted on CT chest in 01/2021.

## 2021-10-25 NOTE — Assessment & Plan Note (Signed)
Allergen panel with IgE and CBC with diff. Continue Singulair and Xyzal.

## 2021-10-25 NOTE — Progress Notes (Signed)
@Patient  ID: Rita Levine, female    DOB: 1950-11-02, 71 y.o.   MRN: [id number]  Chief Complaint  Patient presents with   Follow-up    6 week follow up. Pt states that she feels the same since last office visit and has not changed in symptoms. Pt states she is still having a cough.     Referring provider: Ma Hillock, DO  HPI: 71 year old female, never smoker followed for hx of asthma, chronic cough.  She is a patient of Dr. Gustavus Bryant and was last seen in office on 09/14/2019 by [facility] NP.  Past medical history significant for hypertension, aortic atherosclerosis, CAD, pulmonary hypertension, GERD, IBS, hypothyroidism, HLD, fibromyalgia, allergic rhinitis  TEST/EVENTS:  01/15/2021 CT chest with contrast: moderate aortic atherosclerosis. Occasional coronary artery calcifications. Tiny hiatal hernia. Linear atelectasis/scar in the lingula, similar to prior exam. 3 mm pleural based nodule in the right upper lobe, likely benign. 04/28/2021 CXR 1 view: Lung volumes normal.  Pulmonary vasculature and the cardiomediastinal silhouette are within normal limits.  08/02/2021: OV with Dr. Melvyn Novas.  Initial consult for chronic cough since having pneumonia in 2019.  No report of dyspnea.  Was previously placed on Symbicort 80 and was also using [place] twice a day.  Cough worse with Symbicort -suspected upper airway cough syndrome.  Recommended stopping Symbicort on trial basis.  Aggressive treatment of GERD with Protonix 40 mg twice a day and Pepcid 20 mg in the evening.  Albuterol and budesonide nebs up to twice a day if needed.  6-day prednisone taper.  09/12/2021: OV with Ajahnae Rathgeber NP for 6-week follow-up.  Did not notice major change in her symptoms.  She did experience some shortness of breath and chest tightness after burning some trash in her yard and working in the attic earlier in the week.  Felt some improvement but had not returned to baseline.  Persistent cough with primarily clear sputum production.  Allergy  symptoms uncontrolled and currently being treated by ENT.  Felt GERD symptoms improved with twice daily PPI.  Increased ICS budesonide nebs to 0.5 mg.  Prednisone taper.  10/25/2021: Today - follow up Patient presents today for 6 week follow up. She continues to experience persistent cough with clear sputum production. She occasionally experiences minimal shortness of breath upon exertion and with coughing spells. She continues to have post nasal drip and difficulties with her chronic sinusitis which she is scheduled to follow up with ENT this week to discuss. She also notes occasional breakthrough reflux, despite Twice daily PPI and pepcid nightly. She elevates the head of her bed and avoids triggering foods. She denies wheezing, orthopnea, PND, lower extremity swelling, hemoptysis, or chest pain. She did notice some improvement in her symptoms with the prednisone taper and reports that her albuterol rescue inhaler does relieve her shortness of breath. She continues on albuterol and budesonide nebs Twice daily and is using her rescue inhaler almost daily. She continues on Singulair, Xyzal and nasal irrigations.   FeNO 12 ppb  Allergies  Allergen Reactions   Sulfamethoxazole-Trimethoprim Hives   Avelox [Moxifloxacin Hcl In Nacl]     Racing heart   Cephalexin Hives   Codeine Hives    Heart racing   Erythromycin Hives   Flagyl [Metronidazole]     Unknown reaction   Propranolol Hives   Sulfa Antibiotics Hives   Tetanus Toxoid Swelling    Reports a fever and headache with swelling.    Tramadol Hives   Statins Other (See Comments)  Myalgia     Immunization History  Administered Date(s) Administered   Influenza-Unspecified 06/11/2019, 07/14/2020   Pneumococcal Conjugate-13 07/03/2016   Pneumococcal Polysaccharide-23 08/03/2019   Tdap 05/02/2014   Zoster, Live 10/14/2010    Past Medical History:  Diagnosis Date   Allergic rhinitis    Allergic rhinitis due to pollen 12/03/2019    Anemia    Asthma    Chest pain, unspecified 04/07/2014   Chicken pox    Colon polyp    Diverticulosis    Dysphagia    Dysrhythmia    Palpitations   Fibromyalgia    Food allergy    Gallstone    GERD (gastroesophageal reflux disease)    Hiatal hernia    Hypercholesteremia    Hypertension    Hypothyroidism    IBS (irritable bowel syndrome)    Migraine headache    occasionally   Osteoarthritis    Osteopenia    Pneumonia    PONV (postoperative nausea and vomiting)    Pulmonary hypertension ([place])    pt denies, pt states she was tested for it but was not diagnosed with it   Rectal bleeding    SBO (small bowel obstruction) ([place]) 10/20/2017   Seasonal allergies    Thyroid nodule     Tobacco History: Social History   Tobacco Use  Smoking Status Never  Smokeless Tobacco Never   Counseling given: Not Answered   Outpatient Medications Prior to Visit  Medication Sig Dispense Refill   albuterol (PROAIR HFA) 108 (90 Base) MCG/ACT inhaler Inhale 2 puffs into the lungs every 6 (six) hours as needed. 6.7 g 5   albuterol (PROVENTIL) (2.5 MG/3ML) 0.083% nebulizer solution Take 3 mLs (2.5 mg total) by nebulization every 6 (six) hours as needed. 75 mL 0   ARMOUR THYROID 15 MG tablet Take 1 tablet by mouth in the AM Once a day 90 tablet 3   ARMOUR THYROID 30 MG tablet Take 1 tablet by mouth in the AM once a day 90 tablet 3   Ascorbic Acid (VITAMIN C) 1000 MG tablet Take 3,000 mg by mouth daily.     atenolol (TENORMIN) 25 MG tablet Take 0.5-1 tablets (12.5-25 mg total) by mouth daily. 90 tablet 1   budesonide (PULMICORT) 0.25 MG/2ML nebulizer solution One vial twice daily with albuterol 120 mL 12   budesonide (PULMICORT) 0.5 MG/2ML nebulizer solution Take 2 mLs (0.5 mg total) by nebulization in the morning and at bedtime. 20 mL 12   calcium carbonate (TUMS - DOSED IN MG ELEMENTAL CALCIUM) 500 MG chewable tablet Chew 3 tablets by mouth daily as needed for indigestion or heartburn.      Carboxymethylcellulose Sodium (THERATEARS OP) Place 1 drop into both eyes 2 (two) times daily.     Cholecalciferol (VITAMIN D) 125 MCG (5000 UT) CAPS Take 5,000 Units by mouth daily.      Coenzyme Q10 (COQ-10) 100 MG CAPS Take 100 mg by mouth daily.     ezetimibe (ZETIA) 10 MG tablet Take 1 tablet (10 mg total) by mouth daily. 90 tablet 3   famotidine (PEPCID) 20 MG tablet Take 1 tablet (20 mg total) by mouth at bedtime. 30 tablet 4   fexofenadine (ALLERGY RELIEF) 180 MG tablet Take 1 tablet (180 mg total) by mouth daily. 90 tablet 1   fluticasone (FLONASE) 50 MCG/ACT nasal spray Place 2 sprays into both nostrils daily. 16 mL 11   folic acid (FOLVITE) 1 MG tablet SMARTSIG:2 Tablet(s) By Mouth     hydrochlorothiazide (  MICROZIDE) 12.5 MG capsule Take 1 capsule (12.5 mg total) by mouth daily as needed. 45 capsule 1   hyoscyamine (LEVSIN) 0.125 MG tablet Take 1 tablet (0.125 mg total) by mouth every 6 (six) hours as needed. 120 tablet 5   levocetirizine (XYZAL) 5 MG tablet Take 1 tablet (5 mg total) by mouth every evening. 90 tablet 3   meloxicam (MOBIC) 15 MG tablet Take 1 tablet (15 mg total) by mouth daily. 90 tablet 1   mometasone (ELOCON) 0.1 % lotion Apply 1 application topically daily as needed (ear irritation).      montelukast (SINGULAIR) 10 MG tablet TAKE 1 TABLET BY MOUTH EVERYDAY AT BEDTIME 90 tablet 1   mupirocin ointment (BACTROBAN) 2 % Apply 1 application topically 2 (two) times daily. 30 g 2   ondansetron (ZOFRAN-ODT) 8 MG disintegrating tablet Take 1 tablet (8 mg total) by mouth every 8 (eight) hours as needed for nausea or vomiting. 30 tablet 1   pantoprazole (PROTONIX) 40 MG tablet Take 1 tablet (40 mg total) by mouth 2 (two) times daily. 60 tablet 0   predniSONE (DELTASONE) 10 MG tablet 4 tabs for 2 days, then 3 tabs for 2 days, 2 tabs for 2 days, then 1 tab for 2 days, then stop 20 tablet 0   Probiotic CAPS Take 1 capsule by mouth daily.     rosuvastatin (CRESTOR) 10 MG tablet  Take 1 tablet (10 mg total) by mouth 3 (three) times a week. 40 tablet 3   tiZANidine (ZANAFLEX) 4 MG tablet Take 1 tablet (4 mg total) by mouth every 6 (six) hours as needed for muscle spasms. 120 tablet 5   Turmeric (QC TUMERIC COMPLEX PO) Take by mouth.     VITAMIN A PO Take 1 tablet by mouth daily.     zinc gluconate 50 MG tablet Take 100 mg by mouth daily.     zolpidem (AMBIEN) 5 MG tablet Take 1 tablet (5 mg total) by mouth at bedtime. As needed 90 tablet 1   No facility-administered medications prior to visit.     Review of Systems:   Constitutional: No weight loss or gain, night sweats, fevers, chills, fatigue, or lassitude. HEENT: No headaches, difficulty swallowing, tooth/dental problems, or sore throat. No sneezing, itching, ear ache. +nasal congestion, post nasal drip CV:  No chest pain, orthopnea, PND, swelling in lower extremities, anasarca, dizziness, palpitations, syncope Resp: +shortness of breath with exertion; productive cough with small amount of clear sputum (unchanged). No excess mucus or change in color of mucus. No hemoptysis. No wheezing.  No chest wall deformity GI:  No heartburn, indigestion, abdominal pain, nausea, vomiting, diarrhea, change in bowel habits, loss of appetite, bloody stools.  GU: No dysuria, change in color of urine, urgency or frequency.  No flank pain, no hematuria  Skin: No rash, lesions, ulcerations MSK:  No joint pain or swelling.  No decreased range of motion.  No back pain. Neuro: No dizziness or lightheadedness.  Psych: No depression or anxiety. Mood stable.     Physical Exam:  BP 140/78 (BP Location: Right Arm, Patient Position: Sitting, Cuff Size: Normal)    Pulse 64    Temp 98 F (36.7 C) (Oral)    Ht 4\' 10"  (1.473 m)    Wt 142 lb (64.4 kg)    SpO2 98%    BMI 29.68 kg/m   GEN: Pleasant, interactive, well-nourished; in no acute distress. HEENT:  Normocephalic and atraumatic. EACs patent bilaterally. TM pearly gray with present  light reflex bilaterally. PERRLA. Sclera white. Nasal turbinates erythematous, moist and patent bilaterally. No rhinorrhea present. Oropharynx erythematous and moist, without exudate or edema. No lesions, ulcerations NECK:  Supple w/ fair ROM. No JVD present. Normal carotid impulses w/o bruits. Thyroid symmetrical with no goiter or nodules palpated. No lymphadenopathy.   CV: RRR, no m/r/g, no peripheral edema. Pulses intact, +2 bilaterally. No cyanosis, pallor or clubbing. PULMONARY:  Unlabored, regular breathing. Clear bilaterally A&P w/o wheezes/rales/rhonchi. No accessory muscle use. No dullness to percussion. GI: BS present and normoactive. Soft, non-tender to palpation. No organomegaly or masses detected. No CVA tenderness. MSK: No erythema, warmth or tenderness. Cap refil <2 sec all extrem. No deformities or joint swelling noted.  Neuro: A/Ox3. No focal deficits noted.   Skin: Warm, no lesions or rashe Psych: Normal affect and behavior. Judgement and thought content appropriate.     Lab Results:  CBC    Component Value Date/Time   WBC 4.5 04/28/2021 0934   RBC 5.02 04/28/2021 0934   HGB 14.2 04/28/2021 0934   HCT 41.8 04/28/2021 0934   PLT 247 04/28/2021 0934   MCV 83.3 04/28/2021 0934   MCH 28.3 04/28/2021 0934   MCHC 34.0 04/28/2021 0934   RDW 13.9 04/28/2021 0934   LYMPHSABS 0.8 04/28/2021 0934   MONOABS 0.4 04/28/2021 0934   EOSABS 0.0 04/28/2021 0934   BASOSABS 0.0 04/28/2021 0934    BMET    Component Value Date/Time   NA 134 (L) 04/28/2021 0934   NA 138 01/04/2021 1001   K 4.3 04/28/2021 0934   CL 102 04/28/2021 0934   CO2 23 04/28/2021 0934   GLUCOSE 148 (H) 04/28/2021 0934   BUN 8 04/28/2021 0934   BUN 12 01/04/2021 1001   CREATININE 0.67 04/28/2021 0934   CREATININE 0.73 11/28/2020 1342   CALCIUM 9.0 04/28/2021 0934   GFRNONAA >60 04/28/2021 0934    BNP No results found for: BNP   Imaging:  No results found.    No flowsheet data found.  No  results found for: NITRICOXIDE      Assessment & Plan:   Upper airway cough syndrome Persistent productive cough and minimal SOB with exertion. Suspect uncontrolled chronic sinusitis and GERD contributing. Hx of asthma - spirometry 2012 normal; no reports of wheezing. Slight improvement with prednisone. Trial triple therapy inhaler with Breztri 2 puffs Twice daily. Stop nebs. PRN albuterol. Instructed to follow up with ENT as scheduled. Advised to follow up with GI to discuss breakthrough GERD as she is already on Twice daily PPI and H2 once daily - may need EGD for further eval. Continue Singulair, Xyzal, saline nasal rinses. PFTs ordered today. Allergen panel and CBC with diff. FeNO 12 ppb  Patient Instructions  -Continue albuterol inhaler 2 puffs or 3 mL nebulizer every 6 hours as needed for shortness of breath or cough -Stop budesonide neb -Continue sinuglair 10 mg At bedtime -Continue protonix 40 mg Twice daily -Continue pepcid 10 mg daily  -Continue Xyzal 5 mg At bedtime  -Continue nasal irrigation treatments per ENT   -Breztri 2 puffs Twice daily. Brush tongue and rinse mouth afterwards. Trial with samples. If improvement in symptoms, we can send in a rx to continue using.  -Mucinex DM Twice daily for cough and congestion -Chlortab 4 mg over the counter At bedtime for cough   Asthma Action Plan in place Rinse mouth after inhaled corticosteroid use.  Avoid triggers, when able.  Exercise encouraged. Notify if worsening symptoms upon exertion.  Notify and seek help if symptoms unrelieved by rescue inhaler.    Follow up with ENT as scheduled.   Contact GI for follow up to discuss breakthrough GERD. I have sent them a message as well.   Pulmonary function testing scheduled today.  Labs today - CBC with diff and allergen panel   Follow up after PFTs with Dr. Melvyn Novas. If symptoms do not improve or worsen, please contact office for sooner follow up or seek emergency  care.   Seasonal allergies Allergen panel with IgE and CBC with diff. Continue Singulair and Xyzal.  GERD (gastroesophageal reflux disease) Continue Twice daily PPI and pepcid At bedtime. Avoid GERD triggering foods. Continue to elevate HOB. Advised follow up with GI. Will forward message to them as well to discuss breakthrough GERD symptoms. Tiny hiatal hernia noted on CT chest in 01/2021.  Asthma, intrinsic FeNO 12 ppb today. Previous normal spirometry. No significant wheeze or environmental triggers noted. PFTs ordered. Trial triple therapy with Shary Key, NP 10/25/2021  Pt aware and understands NP's role.

## 2021-10-25 NOTE — Assessment & Plan Note (Addendum)
Persistent productive cough and minimal SOB with exertion. Suspect uncontrolled chronic sinusitis and GERD contributing. Hx of asthma - spirometry 2012 normal; no reports of wheezing. Slight improvement with prednisone. Trial triple therapy inhaler with Breztri 2 puffs Twice daily. Stop nebs. PRN albuterol. Instructed to follow up with ENT as scheduled. Advised to follow up with GI to discuss breakthrough GERD as she is already on Twice daily PPI and H2 once daily - may need EGD for further eval. Continue Singulair, Xyzal, saline nasal rinses. PFTs ordered today. Allergen panel and CBC with diff. FeNO 12 ppb  Patient Instructions  -Continue albuterol inhaler 2 puffs or 3 mL nebulizer every 6 hours as needed for shortness of breath or cough -Stop budesonide neb -Continue sinuglair 10 mg At bedtime -Continue protonix 40 mg Twice daily -Continue pepcid 10 mg daily  -Continue Xyzal 5 mg At bedtime  -Continue nasal irrigation treatments per ENT   -Breztri 2 puffs Twice daily. Brush tongue and rinse mouth afterwards. Trial with samples. If improvement in symptoms, we can send in a rx to continue using.  -Mucinex DM Twice daily for cough and congestion -Chlortab 4 mg over the counter At bedtime for cough   Asthma Action Plan in place Rinse mouth after inhaled corticosteroid use.  Avoid triggers, when able.  Exercise encouraged. Notify if worsening symptoms upon exertion.  Notify and seek help if symptoms unrelieved by rescue inhaler.    Follow up with ENT as scheduled.   Contact GI for follow up to discuss breakthrough GERD. I have sent them a message as well.   Pulmonary function testing scheduled today.  Labs today - CBC with diff and allergen panel   Follow up after PFTs with Dr. Melvyn Novas. If symptoms do not improve or worsen, please contact office for sooner follow up or seek emergency care.

## 2021-10-28 LAB — ALLERGEN PANEL (27) + IGE
Alternaria Alternata IgE: <0.1 kU/L
Aspergillus Fumigatus IgE: <0.1 kU/L
Bahia Grass IgE: <0.1 kU/L
Bermuda Grass IgE: <0.1 kU/L
Cat Dander IgE: <0.1 kU/L
Cedar, Mountain IgE: <0.1 kU/L
Cladosporium Herbarum IgE: <0.1 kU/L
Cocklebur IgE: <0.1 kU/L
Cockroach, American IgE: <0.1 kU/L
Common Silver Birch IgE: <0.1 kU/L
D Farinae IgE: <0.1 kU/L
D Pteronyssinus IgE: <0.1 kU/L
Dog Dander IgE: <0.1 kU/L
Elm, American IgE: <0.1 kU/L
Hickory, White IgE: <0.1 kU/L
IgE (Immunoglobulin E), Serum: 15 IU/mL (ref 6–495)
Johnson Grass IgE: <0.1 kU/L
Kentucky Bluegrass IgE: <0.1 kU/L
Maple/Box Elder IgE: <0.1 kU/L
Mucor Racemosus IgE: <0.1 kU/L
Oak, White IgE: <0.1 kU/L
Penicillium Chrysogen IgE: <0.1 kU/L
Pigweed, Rough IgE: <0.1 kU/L
Plantain, English IgE: <0.1 kU/L
Ragweed, Short IgE: <0.1 kU/L
Setomelanomma Rostrat: <0.1 kU/L
Timothy Grass IgE: <0.1 kU/L
White Mulberry IgE: <0.1 kU/L

## 2021-10-29 ENCOUNTER — Ambulatory Visit (HOSPITAL_BASED_OUTPATIENT_CLINIC_OR_DEPARTMENT_OTHER)
Admission: RE | Admit: 2021-10-29 | Discharge: 2021-10-29 | Disposition: A | Discharge status: Home / Self Care | Payer: Medicare HMO | Source: Ambulatory Visit | Attending: Family Medicine | Admitting: Family Medicine

## 2021-10-29 ENCOUNTER — Other Ambulatory Visit: Payer: Self-pay

## 2021-10-29 ENCOUNTER — Encounter (HOSPITAL_BASED_OUTPATIENT_CLINIC_OR_DEPARTMENT_OTHER): Payer: Self-pay

## 2021-10-29 DIAGNOSIS — Z1231 Encounter for screening mammogram for malignant neoplasm of breast: Secondary | ICD-10-CM | POA: Insufficient documentation

## 2021-10-29 NOTE — Progress Notes (Signed)
Allergen panel negative. CBC with diff nl. Thanks

## 2021-10-31 DIAGNOSIS — K148 Other diseases of tongue: Secondary | ICD-10-CM | POA: Diagnosis not present

## 2021-10-31 DIAGNOSIS — J310 Chronic rhinitis: Secondary | ICD-10-CM | POA: Diagnosis not present

## 2021-10-31 DIAGNOSIS — K140 Glossitis: Secondary | ICD-10-CM | POA: Diagnosis not present

## 2021-10-31 DIAGNOSIS — D101 Benign neoplasm of tongue: Secondary | ICD-10-CM | POA: Diagnosis not present

## 2021-11-06 ENCOUNTER — Ambulatory Visit (INDEPENDENT_AMBULATORY_CARE_PROVIDER_SITE_OTHER): Payer: Medicare HMO | Admitting: Family Medicine

## 2021-11-06 ENCOUNTER — Encounter: Payer: Self-pay | Admitting: Family Medicine

## 2021-11-06 ENCOUNTER — Other Ambulatory Visit: Payer: Self-pay

## 2021-11-06 VITALS — BP 145/68 | HR 67 | Temp 98.7°F | Ht <= 58 in | Wt 143.0 lb

## 2021-11-06 DIAGNOSIS — M797 Fibromyalgia: Secondary | ICD-10-CM

## 2021-11-06 DIAGNOSIS — E782 Mixed hyperlipidemia: Secondary | ICD-10-CM

## 2021-11-06 DIAGNOSIS — E041 Nontoxic single thyroid nodule: Secondary | ICD-10-CM

## 2021-11-06 DIAGNOSIS — J302 Other seasonal allergic rhinitis: Secondary | ICD-10-CM

## 2021-11-06 DIAGNOSIS — Z8619 Personal history of other infectious and parasitic diseases: Secondary | ICD-10-CM

## 2021-11-06 DIAGNOSIS — J45909 Unspecified asthma, uncomplicated: Secondary | ICD-10-CM | POA: Diagnosis not present

## 2021-11-06 DIAGNOSIS — E039 Hypothyroidism, unspecified: Secondary | ICD-10-CM

## 2021-11-06 DIAGNOSIS — M5135 Other intervertebral disc degeneration, thoracolumbar region: Secondary | ICD-10-CM

## 2021-11-06 DIAGNOSIS — I10 Essential (primary) hypertension: Secondary | ICD-10-CM

## 2021-11-06 DIAGNOSIS — E663 Overweight: Secondary | ICD-10-CM

## 2021-11-06 DIAGNOSIS — R002 Palpitations: Secondary | ICD-10-CM | POA: Diagnosis not present

## 2021-11-06 DIAGNOSIS — G479 Sleep disorder, unspecified: Secondary | ICD-10-CM | POA: Diagnosis not present

## 2021-11-06 DIAGNOSIS — K219 Gastro-esophageal reflux disease without esophagitis: Secondary | ICD-10-CM | POA: Diagnosis not present

## 2021-11-06 DIAGNOSIS — I2584 Coronary atherosclerosis due to calcified coronary lesion: Secondary | ICD-10-CM

## 2021-11-06 DIAGNOSIS — I2510 Atherosclerotic heart disease of native coronary artery without angina pectoris: Secondary | ICD-10-CM

## 2021-11-06 DIAGNOSIS — M1990 Unspecified osteoarthritis, unspecified site: Secondary | ICD-10-CM

## 2021-11-06 DIAGNOSIS — K588 Other irritable bowel syndrome: Secondary | ICD-10-CM

## 2021-11-06 DIAGNOSIS — I700 Atherosclerosis of aorta: Secondary | ICD-10-CM

## 2021-11-06 DIAGNOSIS — M48061 Spinal stenosis, lumbar region without neurogenic claudication: Secondary | ICD-10-CM

## 2021-11-06 MED ORDER — ZOLPIDEM TARTRATE 5 MG PO TABS: 5.0000 mg | ORAL_TABLET | Freq: Every day | ORAL | 1 refills | Status: DC

## 2021-11-06 MED ORDER — LEVOCETIRIZINE DIHYDROCHLORIDE 5 MG PO TABS: 5.0000 mg | ORAL_TABLET | Freq: Every evening | ORAL | 3 refills | Status: DC

## 2021-11-06 MED ORDER — HYDROCHLOROTHIAZIDE 12.5 MG PO CAPS: 12.5000 mg | ORAL_CAPSULE | Freq: Every day | ORAL | 1 refills | Status: DC | PRN

## 2021-11-06 MED ORDER — HYOSCYAMINE SULFATE 0.125 MG PO TABS: 0.1250 mg | ORAL_TABLET | Freq: Four times a day (QID) | ORAL | 5 refills | Status: DC | PRN

## 2021-11-06 MED ORDER — ATENOLOL 25 MG PO TABS: 12.5000 mg | ORAL_TABLET | Freq: Every day | ORAL | 1 refills | Status: DC

## 2021-11-06 MED ORDER — TIZANIDINE HCL 4 MG PO TABS: 4.0000 mg | ORAL_TABLET | Freq: Four times a day (QID) | ORAL | 5 refills | Status: DC | PRN

## 2021-11-06 MED ORDER — EZETIMIBE 10 MG PO TABS: 10.0000 mg | ORAL_TABLET | Freq: Every day | ORAL | 3 refills | Status: DC

## 2021-11-06 MED ORDER — FLUTICASONE PROPIONATE 50 MCG/ACT NA SUSP: 2.0000 | Freq: Every day | NASAL | 11 refills | Status: DC

## 2021-11-06 MED ORDER — ALBUTEROL SULFATE HFA 108 (90 BASE) MCG/ACT IN AERS: 2.0000 | INHALATION_SPRAY | Freq: Four times a day (QID) | RESPIRATORY_TRACT | 5 refills | Status: DC | PRN

## 2021-11-06 MED ORDER — PANTOPRAZOLE SODIUM 40 MG PO TBEC: 40.0000 mg | DELAYED_RELEASE_TABLET | Freq: Two times a day (BID) | ORAL | 3 refills | Status: DC

## 2021-11-06 MED ORDER — VALACYCLOVIR HCL 1 G PO TABS: ORAL_TABLET | ORAL | 1 refills | Status: DC

## 2021-11-06 MED ORDER — MELOXICAM 15 MG PO TABS: 15.0000 mg | ORAL_TABLET | Freq: Every day | ORAL | 1 refills | Status: DC

## 2021-11-06 MED ORDER — MONTELUKAST SODIUM 10 MG PO TABS: ORAL_TABLET | ORAL | 1 refills | Status: DC

## 2021-11-06 NOTE — Progress Notes (Signed)
This visit occurred during the SARS-CoV-2 public health emergency.  Safety protocols were in place, including screening questions prior to the visit, additional usage of staff PPE, and extensive cleaning of exam room while observing appropriate contact time as indicated for disinfecting solutions.    Patient ID: Rita Levine, female  DOB: 17-Jan-1951, 71 y.o.   MRN: [id number] Patient Care Team    Relationship Specialty Notifications Start End  Ma Hillock, DO PCP - General Family Medicine  12/01/19   Jettie Booze, MD PCP - Cardiology Cardiology  01/26/20   Milus Banister, MD Attending Physician Gastroenterology  12/01/19     Chief Complaint  Patient presents with   Hypertension    Jay; pt is not fasting    Subjective: Rita Levine is a 71 y.o.  Female  present for cmc. All past medical history, surgical history, allergies, family history, immunizations, medications and social history were updated in the electronic medical record today. All recent labs, ED visits and hospitalizations within the last year were reviewed.  Gastroesophageal reflux disease, unspecified whether esophagitis present Patient reports symptoms are well controlled on omeprazole and Pepcid.   Essential hypertension/HLD/Statin declined/Palpitations/Obesity (BMI 30-39.9) Pt reports compliance with atenolol use of Maxide or losartan half tab if needed only.  Blood pressures ranges at home within normal limits. Patient denies chest pain, shortness of breath, dizziness or lower extremity edema.  Pt does not daily baby ASA. Pt is not prescribed statin. Labs up-to-date 07/2019 at prior PCP   Fibromyalgia/osteoarthritis, unspecified osteoarthritis type, unspecified site/ DDD (degenerative disc disease), lumbar/Spinal stenosis of lumbar region, unspecified whether neurogenic claudication present Patient reports her fibromyalgia and arthritis symptoms are decently controlled on on Mobic and Zanaflex.  She  does routinely see chiropractic services in the past and would like to be referred to a DO for OMT if possible.   Seasonal allergies/Allergic rhinitis due to pollen, unspecified seasonality/ Multiple food allergies She reports allergies are stable on Xyzal nightly, Singulair nightly and Allegra as needed in the day.  Prior note: Patient reports her allergies have always been rather uncontrolled.  When she lived in a different state she had food allergy testing and was allergic to many foods.  She at one time had allergy shots and this is when her allergies were the best as far as control.  She reports frequent occurrence of sinus infections and sinus headaches.  She had an MRI in 2013.  Her current regimen consist of Xyzal, Allegra, Singulair and Flonase.  She has taking Zyrtec in the past.  She reports the Xyzal was added last year but she does not feel its been helpful.   irritable bowel syndrome She reports an extensive history of diverticulitis requiring colon resection and later exploratory lap of the abdomen with removal of adhesions for short bowel obstruction.  SHe has continued irritable bowel symptoms are well controlled on Levsin as needed.  She is established with gastroenterology.  Last colonoscopy 2017, with Dr. Ardis Hughs   Asthma, intrinsic Patient reports her asthma is well controlled on Symbicort.  She rarely needs to use her albuterol inhaler.   Hypothyroidism, unspecified type/Thyroid nodule She reports compliance with Armour 45 mg total dose.  Labs are up-to-date Prior note: Thyroid nodule history.  Last ultrasound 08/17/2018 with left thyroid nodule.  Per radiology report 1 year follow-up was recommended.  Patient does endorse mild compression-like symptoms.  She states her prior PCP had ordered follow-up, but it was canceled secondary to Covid  pandemic.  Patient had thyroid ultrasound completed at [facility] radiology in [place].  Depression screen [facility] 2/9 11/06/2021 06/12/2021  11/28/2020 07/18/2020 12/03/2019  Decreased Interest 0 0 0 0 0  Down, Depressed, Hopeless 0 0 0 0 0  PHQ - 2 Score 0 0 0 0 0  Altered sleeping 0 - - - -  Tired, decreased energy 1 - - - -  Change in appetite 0 - - - -  Feeling bad or failure about yourself  0 - - - -  Trouble concentrating 0 - - - -  Moving slowly or fidgety/restless 0 - - - -  Suicidal thoughts 0 - - - -  PHQ-9 Score 1 - - - -   GAD 7 : Generalized Anxiety Score 11/06/2021  Nervous, Anxious, on Edge 0  Control/stop worrying 0  Worry too much - different things 0  Trouble relaxing 0  Restless 0  Easily annoyed or irritable 0  Afraid - awful might happen 0  Total GAD 7 Score 0     Immunization History  Administered Date(s) Administered   Influenza-Unspecified 06/11/2019, 07/14/2020   Pneumococcal Conjugate-13 07/03/2016   Pneumococcal Polysaccharide-23 08/03/2019   Tdap 05/02/2014   Zoster, Live 10/14/2010    Past Medical History:  Diagnosis Date   Allergic rhinitis    Allergic rhinitis due to pollen 12/03/2019   Anemia    Asthma    Chest pain, unspecified 04/07/2014   Chicken pox    Colon polyp    Diverticulosis    Dysphagia    Dysrhythmia    Palpitations   Fibromyalgia    Food allergy    Gallstone    GERD (gastroesophageal reflux disease)    Hiatal hernia    Hypercholesteremia    Hypertension    Hypothyroidism    IBS (irritable bowel syndrome)    Ingrown toenail 08/23/2020   Migraine headache    occasionally   Osteoarthritis    Osteopenia    Pneumonia    PONV (postoperative nausea and vomiting)    Pulmonary hypertension ([place])    pt denies, pt states she was tested for it but was not diagnosed with it   Rectal bleeding    SBO (small bowel obstruction) ([place]) 10/20/2017   Seasonal allergies    Thyroid nodule    Trigger finger of left and right ring fingers 01/11/2021   Allergies  Allergen Reactions   Sulfamethoxazole-Trimethoprim Hives   Avelox [Moxifloxacin Hcl In Nacl]     Racing  heart   Cephalexin Hives   Codeine Hives    Heart racing   Erythromycin Hives   Flagyl [Metronidazole]     Unknown reaction   Propranolol Hives   Sulfa Antibiotics Hives   Tetanus Toxoid Swelling    Reports a fever and headache with swelling.    Tramadol Hives   Statins Other (See Comments)    Myalgia    Past Surgical History:  Procedure Laterality Date   APPENDECTOMY  1982   BREAST EXCISIONAL BIOPSY Right 1995   benign   BREAST LUMPECTOMY  1991   CARPAL TUNNEL RELEASE Right 2006   Right wrist   CESAREAN SECTION     x 2   CHOLECYSTECTOMY N/A 07/25/2020   Procedure: LAPAROSCOPIC CHOLECYSTECTOMY;  Surgeon: [name], MD;  Location: [facility];  Service: General;  Laterality: N/A;   [place] ([place] HX)  01/03/2020   IMAGE MRI brain:  04/2012  No focal IAC or inner ear lesion to explain hearing loss. Slightly greater than expected number of subcortical T2- hyperintensities bilaterally.  These are nonspecific.  They can be seen in the setting of chronic migraine headaches, demyelinating process, chronic microvascular ischemic, prior infection or inflammation, or vasculitis   IMAGE MRI lumbar:  05/01/2006   Mild central canal stenosis with facet arthropathy and mildly bulging disc at L4-5.  There is mild narrowing in the left lateral recess at this level.  No definite neural impingement.  Appearance at this level has not markedly Moderately severe facet arthropathy at L5-S1 with mild interval progression of a diffuse broad based disc bulge.  There is mild biforaminal narrowing.  Central canal is open   LAPAROSCOPIC ABDOMINAL EXPLORATION  2019   adhesion removal> caused bowel blockage    NASAL SEPTUM SURGERY  2004   TONSILLECTOMY  1965   TOTAL ABDOMINAL HYSTERECTOMY W/ BILATERAL SALPINGOOPHORECTOMY  1988   TUBAL LIGATION  1974   UPPER GASTROINTESTINAL ENDOSCOPY  2020   Family History  Problem Relation Age of Onset    Hyperlipidemia Father    Hypertension Father    Arthritis Father    Diabetes Father    Asthma Father    COPD Father    Early death Father    Parkinson's disease Father    Hypertension Mother    Hyperlipidemia Mother    Macular degeneration Mother    Arthritis Mother    Hearing loss Mother    Heart disease Mother    Kidney disease Mother    Throat cancer Brother        lung, and tongue   Arthritis Brother    COPD Brother    Diabetes type II Sister    COPD Sister    Heart disease Sister    Hypertension Sister    Thyroid cancer Sister    Lung cancer Sister    Early death Sister    COPD Sister    Breast cancer Maternal Aunt    Lung cancer Other        uncle   Emphysema Maternal Aunt    Emphysema Maternal Uncle    Clotting disorder Sister    Brain cancer Sister        brain tumor- not malignant   Breast cancer Cousin    Cancer Sister    Alcohol abuse Sister    COPD Sister    Early death Sister    Alcohol abuse Brother    Arthritis Brother    Depression Brother    Diabetes Brother    Hypercholesterolemia Brother    Colon cancer Neg Hx    Esophageal cancer Neg Hx    Rectal cancer Neg Hx    Stomach cancer Neg Hx    Social History   Social History Narrative   Marital status/children/pets: married, 2 children   Education/employment: HS grad. retired   Engineer, materials:      -smoke alarm in the home:Yes     - wears seatbelt: Yes     - Feels safe in their relationships: Yes    Allergies as of 11/06/2021       Reactions   Sulfamethoxazole-trimethoprim Hives   Avelox [moxifloxacin Hcl In Nacl]    Racing heart   Cephalexin Hives   Codeine Hives   Heart racing   Erythromycin Hives   Flagyl [metronidazole]    Unknown reaction   Propranolol Hives   Sulfa Antibiotics Hives   Tetanus Toxoid Swelling   Reports a  fever and headache with swelling.    Tramadol Hives   Statins Other (See Comments)   Myalgia        Medication List        Accurate as of November 06, 2021 11:59 PM. If you have any questions, ask your nurse or doctor.          STOP taking these medications    Breztri Aerosphere 160-9-4.8 MCG/ACT Aero Generic drug: Budeson-Glycopyrrol-Formoterol Stopped by: Howard Pouch, DO   predniSONE 10 MG tablet Commonly known as: DELTASONE Stopped by: Howard Pouch, DO       TAKE these medications    albuterol (2.5 MG/3ML) 0.083% nebulizer solution Commonly known as: PROVENTIL Take 3 mLs (2.5 mg total) by nebulization every 6 (six) hours as needed.   albuterol 108 (90 Base) MCG/ACT inhaler Commonly known as: ProAir HFA Inhale 2 puffs into the lungs every 6 (six) hours as needed.   Armour Thyroid 15 MG tablet Generic drug: thyroid Take 1 tablet by mouth in the AM Once a day   Armour Thyroid 30 MG tablet Generic drug: thyroid Take 1 tablet by mouth in the AM once a day   atenolol 25 MG tablet Commonly known as: TENORMIN Take 0.5-1 tablets (12.5-25 mg total) by mouth daily.   budesonide 0.5 MG/2ML nebulizer solution Commonly known as: Pulmicort Take 2 mLs (0.5 mg total) by nebulization in the morning and at bedtime. What changed: Another medication with the same name was removed. Continue taking this medication, and follow the directions you see here. Changed by: Howard Pouch, DO   calcium carbonate 500 MG chewable tablet Commonly known as: TUMS - dosed in mg elemental calcium Chew 3 tablets by mouth daily as needed for indigestion or heartburn.   CoQ-10 100 MG Caps Take 100 mg by mouth daily.   ezetimibe 10 MG tablet Commonly known as: ZETIA Take 1 tablet (10 mg total) by mouth daily.   famotidine 20 MG tablet Commonly known as: PEPCID Take 1 tablet (20 mg total) by mouth at bedtime.   fexofenadine 180 MG tablet Commonly known as: Allergy Relief Take 1 tablet (180 mg total) by mouth daily.   fluticasone 50 MCG/ACT nasal spray Commonly known as: FLONASE Place 2 sprays into both nostrils daily.   folic acid 1 MG  tablet Commonly known as: FOLVITE SMARTSIG:2 Tablet(s) By Mouth   hydrochlorothiazide 12.5 MG capsule Commonly known as: MICROZIDE Take 1 capsule (12.5 mg total) by mouth daily as needed.   hyoscyamine 0.125 MG tablet Commonly known as: LEVSIN Take 1 tablet (0.125 mg total) by mouth every 6 (six) hours as needed.   levocetirizine 5 MG tablet Commonly known as: XYZAL Take 1 tablet (5 mg total) by mouth every evening.   meloxicam 15 MG tablet Commonly known as: MOBIC Take 1 tablet (15 mg total) by mouth daily.   mometasone 0.1 % lotion Commonly known as: ELOCON Apply 1 application topically daily as needed (ear irritation).   montelukast 10 MG tablet Commonly known as: SINGULAIR TAKE 1 TABLET BY MOUTH EVERYDAY AT BEDTIME   mupirocin ointment 2 % Commonly known as: BACTROBAN Apply 1 application topically 2 (two) times daily.   ondansetron 8 MG disintegrating tablet Commonly known as: ZOFRAN-ODT Take 1 tablet (8 mg total) by mouth every 8 (eight) hours as needed for nausea or vomiting.   pantoprazole 40 MG tablet Commonly known as: PROTONIX Take 1 tablet (40 mg total) by mouth 2 (two) times daily.   Probiotic Caps  Take 1 capsule by mouth daily.   QC TUMERIC COMPLEX PO Take by mouth.   rosuvastatin 10 MG tablet Commonly known as: CRESTOR Take 1 tablet (10 mg total) by mouth 3 (three) times a week.   THERATEARS OP Place 1 drop into both eyes 2 (two) times daily.   tiZANidine 4 MG tablet Commonly known as: ZANAFLEX Take 1 tablet (4 mg total) by mouth every 6 (six) hours as needed for muscle spasms.   valACYclovir 1000 MG tablet Commonly known as: VALTREX 2 tabs at onset, rpt 2 tabs in 12 hours once. Started by: Howard Pouch, DO   VITAMIN A PO Take 1 tablet by mouth daily.   vitamin C 1000 MG tablet Take 3,000 mg by mouth daily.   Vitamin D 125 MCG (5000 UT) Caps Take 5,000 Units by mouth daily.   zinc gluconate 50 MG tablet Take 100 mg by mouth  daily.   zolpidem 5 MG tablet Commonly known as: AMBIEN Take 1 tablet (5 mg total) by mouth at bedtime. As needed        All past medical history, surgical history, allergies, family history, immunizations andmedications were updated in the EMR today and reviewed under the history and medication portions of their EMR.      MM 3D SCREEN BREAST BILATERAL Result Date: 09/29/2020  BI-RADS CATEGORY  1: Negative. Electronically Signed   By: Lajean Manes M.D.   On: 09/29/2020 11:27   LONG TERM MONITOR (3-14 DAYS)  Result Date: 09/29/2020  Normal sinus rhythm.  Rare, briuef runs of PACs.  No sustained pathologic arrhythmias.      ROS: 14 pt review of systems performed and negative (unless mentioned in an HPI)  Objective: BP (!) 145/68    Pulse 67    Temp 98.7 F (37.1 C) (Oral)    Ht 4\' 10"  (1.473 m)    Wt 143 lb (64.9 kg)    SpO2 100%    BMI 29.89 kg/m  Physical Exam Vitals and nursing note reviewed.  Constitutional:      General: She is not in acute distress.    Appearance: Normal appearance. She is not ill-appearing, toxic-appearing or diaphoretic.  HENT:     Head: Normocephalic and atraumatic.     Mouth/Throat:     Mouth: Mucous membranes are moist.  Eyes:     General: No scleral icterus.       Right eye: No discharge.        Left eye: No discharge.     Extraocular Movements: Extraocular movements intact.     Conjunctiva/sclera: Conjunctivae normal.     Pupils: Pupils are equal, round, and reactive to light.  Cardiovascular:     Rate and Rhythm: Normal rate and regular rhythm.  Pulmonary:     Effort: Pulmonary effort is normal. No respiratory distress.     Breath sounds: Normal breath sounds. No wheezing, rhonchi or rales.  Musculoskeletal:     Cervical back: Neck supple. No tenderness.  Lymphadenopathy:     Cervical: No cervical adenopathy.  Skin:    General: Skin is warm and dry.     Coloration: Skin is not jaundiced or pale.     Findings: No erythema or  rash.  Neurological:     Mental Status: She is alert and oriented to person, place, and time. Mental status is at baseline.     Motor: No weakness.     Gait: Gait normal.  Psychiatric:  Mood and Affect: Mood normal.        Behavior: Behavior normal.        Thought Content: Thought content normal.        Judgment: Judgment normal.     No results found.  Assessment/plan: Rita Levine is a 71 y.o. female present for CPE/CMC Gastroesophageal reflux disease, unspecified whether esophagitis present Stable Continue Protonix. Continue Pepcid.   Essential hypertension/HLD/Statin declined/Palpitations/Obesity (BMI 30-39.9) Stable.  Blood pressures are mildly elevated today, patient is in pain secondary to dental work. Continue Tenormin 12.5 mg qhs Continue Microzide 12.5 mg daily PRN if notable fluid or  losartan 1/2 tab. Only if pressures are elevated > 161 systolic.  Patient reports compliance with low dose statin twice a week supplied by Dr. Scarlette Calico.  > she has follow up schedule with him Low-sodium diet.  Routine exercise.   Osteoarthritis, unspecified osteoarthritis type, unspecified site/ DDD (degenerative disc disease), lumbar/Spinal stenosis of lumbar region, unspecified whether neurogenic claudication present/Fibromyalgia Stable. Continue Mobic daily  Continue Zanaflex   Seasonal allergies/Allergic rhinitis due to pollen, unspecified seasonality/ Multiple food allergies Continue allergy med of choice.  Vistaril  Tired & not helpful.  Continue Singulair 10 mg nightly Continue flonase  Sleep disturbance: Stable Continue Ambien prn Tired vistaril- not helpful. Could consider trazodone in the future if willing.  nccs database reviewed today   irritable bowel syndrome Stable Continue probiotic Continue Levsin 0.125 mg every 6 hours as needed  Asthma, intrinsic Stable Continue Symbicort. Continue  albuterol as needed Continue antihistamine regimen    Hypothyroidism, unspecified type/Thyroid nodule Stable Continue Armour Thyroid total dose 45 mg daily (30/15).  This is the only medicine called [facility]. Labs due next visit - US THYROID-improvement in size of thyroid nodule and no further studies needed in the future.  Osteopenia, unspecified location -Vitamin D UTD 07/2021 -UTD 12/2020-DEXA   Return in about 24 weeks (around 04/23/2022) for CPE (30 min), CMC (30 min).    Orders Placed This Encounter  Procedures   Ambulatory referral to Sports Medicine    Meds ordered this encounter  Medications   albuterol (PROAIR HFA) 108 (90 Base) MCG/ACT inhaler    Sig: Inhale 2 puffs into the lungs every 6 (six) hours as needed.    Dispense:  6.7 g    Refill:  5   atenolol (TENORMIN) 25 MG tablet    Sig: Take 0.5-1 tablets (12.5-25 mg total) by mouth daily.    Dispense:  90 tablet    Refill:  1   hydrochlorothiazide (MICROZIDE) 12.5 MG capsule    Sig: Take 1 capsule (12.5 mg total) by mouth daily as needed.    Dispense:  45 capsule    Refill:  1   hyoscyamine (LEVSIN) 0.125 MG tablet    Sig: Take 1 tablet (0.125 mg total) by mouth every 6 (six) hours as needed.    Dispense:  120 tablet    Refill:  5   levocetirizine (XYZAL) 5 MG tablet    Sig: Take 1 tablet (5 mg total) by mouth every evening.    Dispense:  90 tablet    Refill:  3   meloxicam (MOBIC) 15 MG tablet    Sig: Take 1 tablet (15 mg total) by mouth daily.    Dispense:  90 tablet    Refill:  1   montelukast (SINGULAIR) 10 MG tablet    Sig: TAKE 1 TABLET BY MOUTH EVERYDAY AT BEDTIME    Dispense:  90 tablet    Refill:  1   tiZANidine (ZANAFLEX) 4 MG tablet    Sig: Take 1 tablet (4 mg total) by mouth every 6 (six) hours as needed for muscle spasms.    Dispense:  120 tablet    Refill:  5   pantoprazole (PROTONIX) 40 MG tablet    Sig: Take 1 tablet (40 mg total) by mouth 2 (two) times daily.    Dispense:  180 tablet    Refill:  3   fluticasone  (FLONASE) 50 MCG/ACT nasal spray    Sig: Place 2 sprays into both nostrils daily.    Dispense:  16 mL    Refill:  11   ezetimibe (ZETIA) 10 MG tablet    Sig: Take 1 tablet (10 mg total) by mouth daily.    Dispense:  90 tablet    Refill:  3   valACYclovir (VALTREX) 1000 MG tablet    Sig: 2 tabs at onset, rpt 2 tabs in 12 hours once.    Dispense:  20 tablet    Refill:  1   zolpidem (AMBIEN) 5 MG tablet    Sig: Take 1 tablet (5 mg total) by mouth at bedtime. As needed    Dispense:  90 tablet    Refill:  1    Referral Orders         Ambulatory referral to Sports Medicine       Electronically signed by: Howard Pouch, [place]

## 2021-11-06 NOTE — Patient Instructions (Addendum)
°  End of June make physcial appt. We will collect fasting labs then. And cover chronic conditions.

## 2021-11-29 ENCOUNTER — Other Ambulatory Visit: Payer: Self-pay | Admitting: Gastroenterology

## 2021-11-29 ENCOUNTER — Ambulatory Visit: Payer: Medicare HMO | Admitting: Sports Medicine

## 2021-11-29 ENCOUNTER — Other Ambulatory Visit: Payer: Self-pay

## 2021-11-29 VITALS — BP 130/80 | HR 55 | Ht <= 58 in | Wt 140.0 lb

## 2021-11-29 DIAGNOSIS — M9905 Segmental and somatic dysfunction of pelvic region: Secondary | ICD-10-CM

## 2021-11-29 DIAGNOSIS — M5135 Other intervertebral disc degeneration, thoracolumbar region: Secondary | ICD-10-CM | POA: Diagnosis not present

## 2021-11-29 DIAGNOSIS — M9903 Segmental and somatic dysfunction of lumbar region: Secondary | ICD-10-CM | POA: Diagnosis not present

## 2021-11-29 DIAGNOSIS — M9908 Segmental and somatic dysfunction of rib cage: Secondary | ICD-10-CM | POA: Diagnosis not present

## 2021-11-29 DIAGNOSIS — M9901 Segmental and somatic dysfunction of cervical region: Secondary | ICD-10-CM

## 2021-11-29 DIAGNOSIS — M797 Fibromyalgia: Secondary | ICD-10-CM | POA: Diagnosis not present

## 2021-11-29 DIAGNOSIS — M9902 Segmental and somatic dysfunction of thoracic region: Secondary | ICD-10-CM

## 2021-11-29 NOTE — Patient Instructions (Addendum)
Good to see you  Pt can research Duloxetine or Cymbalta can discuss starting for fibromyalgia in follow up visit  Recommend discontinue daily meloxicam use  Recommend using Tylenol [phone] mg 2-3 times a day for pain relief  Recommend using meloxicam as needed for breakthrough pain maximum 1-2 times a  week  3 week follow up
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
## 2021-12-10 ENCOUNTER — Ambulatory Visit (INDEPENDENT_AMBULATORY_CARE_PROVIDER_SITE_OTHER): Payer: Medicare HMO | Admitting: Family Medicine

## 2021-12-10 ENCOUNTER — Encounter: Payer: Self-pay | Admitting: Family Medicine

## 2021-12-10 ENCOUNTER — Other Ambulatory Visit: Payer: Self-pay

## 2021-12-10 ENCOUNTER — Telehealth: Payer: Self-pay

## 2021-12-10 VITALS — BP 114/75 | HR 83 | Temp 98.8°F | Ht <= 58 in | Wt 138.0 lb

## 2021-12-10 DIAGNOSIS — R1310 Dysphagia, unspecified: Secondary | ICD-10-CM

## 2021-12-10 DIAGNOSIS — R599 Enlarged lymph nodes, unspecified: Secondary | ICD-10-CM

## 2021-12-10 DIAGNOSIS — R070 Pain in throat: Secondary | ICD-10-CM | POA: Diagnosis not present

## 2021-12-10 NOTE — Telephone Encounter (Signed)
Calling about [place] that Dr. Raoul Pitch ordered.  It says "NON-THYROID", patient does not agree and would like to speak to someone regarding order.  Please call [phone]

## 2021-12-10 NOTE — Patient Instructions (Addendum)
° ° °  We will call you with results.   Call your GI to schedule appt.  [facility] Address:  [address], [place], [place] [address] Phone: [phone]

## 2021-12-10 NOTE — Progress Notes (Signed)
This visit occurred during the SARS-CoV-2 public health emergency.  Safety protocols were in place, including screening questions prior to the visit, additional usage of staff PPE, and extensive cleaning of exam room while observing appropriate contact time as indicated for disinfecting solutions.    Rita Levine , 03/03/1951, 71 y.o., female MRN: [id number] Patient Care Team    Relationship Specialty Notifications Start End  Ma Hillock, DO PCP - General Family Medicine  12/01/19   Jettie Booze, MD PCP - Cardiology Cardiology  01/26/20   Milus Banister, MD Attending Physician Gastroenterology  12/01/19     Chief Complaint  Patient presents with   Dysphagia    Pt c/o trouble swallowing x 1 mo     Subjective: Pt presents for an OV with complaints of left swollen gland of 1 month  duration.  Associated symptoms include making her anxious and finding it difficult to "swallow."  She reports the pain/discomfort feels "like swelling inward ". She feels like she is having bilateral gland enlargement. TSH normal 11/28/2020.   She has continued her PPI, Pepcid and antihistamines.  She denies fevers or chills.  She reports difficulty swallowing for liquids and solids, but denies choking or having solids become stuck.  Depression screen [facility] 2/9 11/06/2021 06/12/2021 11/28/2020 07/18/2020 12/03/2019  Decreased Interest 0 0 0 0 0  Down, Depressed, Hopeless 0 0 0 0 0  PHQ - 2 Score 0 0 0 0 0  Altered sleeping 0 - - - -  Tired, decreased energy 1 - - - -  Change in appetite 0 - - - -  Feeling bad or failure about yourself  0 - - - -  Trouble concentrating 0 - - - -  Moving slowly or fidgety/restless 0 - - - -  Suicidal thoughts 0 - - - -  PHQ-9 Score 1 - - - -    Allergies  Allergen Reactions   Sulfamethoxazole-Trimethoprim Hives   Avelox [Moxifloxacin Hcl In Nacl]     Racing heart   Cephalexin Hives   Codeine Hives    Heart racing   Erythromycin Hives   Flagyl  [Metronidazole]     Unknown reaction   Propranolol Hives   Sulfa Antibiotics Hives   Tetanus Toxoid Swelling    Reports a fever and headache with swelling.    Tramadol Hives   Statins Other (See Comments)    Myalgia    Social History   Social History Narrative   Marital status/children/pets: married, 2 children   Education/employment: HS grad. retired   Engineer, materials:      -smoke alarm in the home:Yes     - wears seatbelt: Yes     - Feels safe in their relationships: Yes   Past Medical History:  Diagnosis Date   Allergic rhinitis    Allergic rhinitis due to pollen 12/03/2019   Anemia    Asthma    Chest pain, unspecified 04/07/2014   Chicken pox    Colon polyp    Diverticulosis    Dysphagia    Dysrhythmia    Palpitations   Fibromyalgia    Food allergy    Gallstone    GERD (gastroesophageal reflux disease)    Hiatal hernia    Hypercholesteremia    Hypertension    Hypothyroidism    IBS (irritable bowel syndrome)    Ingrown toenail 08/23/2020   Migraine headache    occasionally   Osteoarthritis    Osteopenia  Pneumonia    PONV (postoperative nausea and vomiting)    Pulmonary hypertension ([place])    pt denies, pt states she was tested for it but was not diagnosed with it   Rectal bleeding    SBO (small bowel obstruction) ([place]) 10/20/2017   Seasonal allergies    Thyroid nodule    Trigger finger of left and right ring fingers 01/11/2021   Past Surgical History:  Procedure Laterality Date   APPENDECTOMY  1982   BREAST EXCISIONAL BIOPSY Right 1995   benign   BREAST LUMPECTOMY  1991   CARPAL TUNNEL RELEASE Right 2006   Right wrist   CESAREAN SECTION     x 2   CHOLECYSTECTOMY N/A 07/25/2020   Procedure: LAPAROSCOPIC CHOLECYSTECTOMY;  Surgeon: [name], MD;  Location: [place] [facility];  Service: General;  Laterality: N/A;   COLON RESECTION  1990   COLONOSCOPY     CT ABDOMEN PELVIS W ([place] [facility])  01/03/2020   IMAGE MRI brain:   04/2012   No focal IAC or inner ear lesion to explain hearing loss. Slightly greater than expected number of subcortical T2- hyperintensities bilaterally.  These are nonspecific.  They can be seen in the setting of chronic migraine headaches, demyelinating process, chronic microvascular ischemic, prior infection or inflammation, or vasculitis   IMAGE MRI lumbar:  05/01/2006   Mild central canal stenosis with facet arthropathy and mildly bulging disc at L4-5.  There is mild narrowing in the left lateral recess at this level.  No definite neural impingement.  Appearance at this level has not markedly Moderately severe facet arthropathy at L5-S1 with mild interval progression of a diffuse broad based disc bulge.  There is mild biforaminal narrowing.  Central canal is open   LAPAROSCOPIC ABDOMINAL EXPLORATION  2019   adhesion removal> caused bowel blockage    NASAL SEPTUM SURGERY  2004   TONSILLECTOMY  1965   TOTAL ABDOMINAL HYSTERECTOMY W/ BILATERAL SALPINGOOPHORECTOMY  1988   TUBAL LIGATION  1974   UPPER GASTROINTESTINAL ENDOSCOPY  2020   Family History  Problem Relation Age of Onset   Hyperlipidemia Father    Hypertension Father    Arthritis Father    Diabetes Father    Asthma Father    COPD Father    Early death Father    Parkinson's disease Father    Hypertension Mother    Hyperlipidemia Mother    Macular degeneration Mother    Arthritis Mother    Hearing loss Mother    Heart disease Mother    Kidney disease Mother    Throat cancer Brother        lung, and tongue   Arthritis Brother    COPD Brother    Diabetes type II Sister    COPD Sister    Heart disease Sister    Hypertension Sister    Thyroid cancer Sister    Lung cancer Sister    Early death Sister    COPD Sister    Breast cancer Maternal Aunt    Lung cancer Other        uncle   Emphysema Maternal Aunt    Emphysema Maternal Uncle    Clotting disorder Sister    Brain cancer  Sister        brain tumor- not malignant   Breast cancer Cousin    Cancer Sister    Alcohol abuse Sister    COPD Sister    Early death Sister    Alcohol abuse Brother  Arthritis Brother    Depression Brother    Diabetes Brother    Hypercholesterolemia Brother    Colon cancer Neg Hx    Esophageal cancer Neg Hx    Rectal cancer Neg Hx    Stomach cancer Neg Hx    Allergies as of 12/10/2021       Reactions   Sulfamethoxazole-trimethoprim Hives   Avelox [moxifloxacin Hcl In Nacl]    Racing heart   Cephalexin Hives   Codeine Hives   Heart racing   Erythromycin Hives   Flagyl [metronidazole]    Unknown reaction   Propranolol Hives   Sulfa Antibiotics Hives   Tetanus Toxoid Swelling   Reports a fever and headache with swelling.    Tramadol Hives   Statins Other (See Comments)   Myalgia        Medication List        Accurate as of December 10, 2021 11:30 AM. If you have any questions, ask your nurse or doctor.          albuterol (2.5 MG/3ML) 0.083% nebulizer solution Commonly known as: PROVENTIL Take 3 mLs (2.5 mg total) by nebulization every 6 (six) hours as needed.   albuterol 108 (90 Base) MCG/ACT inhaler Commonly known as: ProAir HFA Inhale 2 puffs into the lungs every 6 (six) hours as needed.   Armour Thyroid 15 MG tablet Generic drug: thyroid Take 1 tablet by mouth in the AM Once a day   Armour Thyroid 30 MG tablet Generic drug: thyroid Take 1 tablet by mouth in the AM once a day   atenolol 25 MG tablet Commonly known as: TENORMIN Take 0.5-1 tablets (12.5-25 mg total) by mouth daily.   budesonide 0.5 MG/2ML nebulizer solution Commonly known as: Pulmicort Take 2 mLs (0.5 mg total) by nebulization in the morning and at bedtime. What changed:  when to take this reasons to take this   calcium carbonate 500 MG chewable tablet Commonly known as: TUMS - dosed in mg elemental calcium Chew 3 tablets by mouth daily as needed for  indigestion or heartburn.   CoQ-10 100 MG Caps Take 100 mg by mouth daily.   ezetimibe 10 MG tablet Commonly known as: ZETIA Take 1 tablet (10 mg total) by mouth daily.   famotidine 20 MG tablet Commonly known as: PEPCID TAKE 1 TABLET BY MOUTH EVERYDAY AT BEDTIME   fexofenadine 180 MG tablet Commonly known as: Allergy Relief Take 1 tablet (180 mg total) by mouth daily.   fluticasone 50 MCG/ACT nasal spray Commonly known as: FLONASE Place 2 sprays into both nostrils daily.   folic acid 1 MG tablet Commonly known as: FOLVITE SMARTSIG:2 Tablet(s) By Mouth   hydrochlorothiazide 12.5 MG capsule Commonly known as: MICROZIDE Take 1 capsule (12.5 mg total) by mouth daily as needed.   hyoscyamine 0.125 MG tablet Commonly known as: LEVSIN Take 1 tablet (0.125 mg total) by mouth every 6 (six) hours as needed.   levocetirizine 5 MG tablet Commonly known as: XYZAL Take 1 tablet (5 mg total) by mouth every evening.   meloxicam 15 MG tablet Commonly known as: MOBIC Take 1 tablet (15 mg total) by mouth daily.   mometasone 0.1 % lotion Commonly known as: ELOCON Apply 1 application topically daily as needed (ear irritation).   montelukast 10 MG tablet Commonly known as: SINGULAIR TAKE 1 TABLET BY MOUTH EVERYDAY AT BEDTIME   mupirocin ointment 2 % Commonly known as: BACTROBAN Apply 1 application topically 2 (two) times daily.  ondansetron 8 MG disintegrating tablet Commonly known as: ZOFRAN-ODT Take 1 tablet (8 mg total) by mouth every 8 (eight) hours as needed for nausea or vomiting.   pantoprazole 40 MG tablet Commonly known as: PROTONIX Take 1 tablet (40 mg total) by mouth 2 (two) times daily.   Probiotic Caps Take 1 capsule by mouth daily.   QC TUMERIC COMPLEX PO Take by mouth.   rosuvastatin 10 MG tablet Commonly known as: CRESTOR Take 1 tablet (10 mg total) by mouth 3 (three) times a week.   THERATEARS OP Place 1 drop into both eyes 2 (two) times daily.    tiZANidine 4 MG tablet Commonly known as: ZANAFLEX Take 1 tablet (4 mg total) by mouth every 6 (six) hours as needed for muscle spasms.   valACYclovir 1000 MG tablet Commonly known as: VALTREX 2 tabs at onset, rpt 2 tabs in 12 hours once.   VITAMIN A PO Take 1 tablet by mouth daily.   vitamin C 1000 MG tablet Take 3,000 mg by mouth daily.   Vitamin D 125 MCG (5000 UT) Caps Take 5,000 Units by mouth daily.   zinc gluconate 50 MG tablet Take 100 mg by mouth daily.   zolpidem 5 MG tablet Commonly known as: AMBIEN Take 1 tablet (5 mg total) by mouth at bedtime. As needed        All past medical history, surgical history, allergies, family history, immunizations andmedications were updated in the EMR today and reviewed under the history and medication portions of their EMR.     Review of Systems  Constitutional:  Negative for chills, diaphoresis, fever, malaise/fatigue and weight loss.  HENT:  Negative for ear discharge, ear pain, sinus pain and sore throat.   Respiratory:  Negative for cough, sputum production and shortness of breath.   Gastrointestinal:  Negative for heartburn and nausea.  Skin:  Negative for rash.  Negative, with the exception of above mentioned in HPI   Objective:  BP 114/75    Pulse 83    Temp 98.8 F (37.1 C) (Oral)    Ht 4\' 10"  (1.473 m)    Wt 138 lb (62.6 kg)    SpO2 99%    BMI 28.84 kg/m  Body mass index is 28.84 kg/m. Physical Exam Vitals and nursing note reviewed.  Constitutional:      General: She is not in acute distress.    Appearance: Normal appearance. She is normal weight. She is not ill-appearing or toxic-appearing.  HENT:     Head: Normocephalic and atraumatic.     Right Ear: Tympanic membrane, ear canal and external ear normal.     Left Ear: Tympanic membrane, ear canal and external ear normal.     Nose: Nose normal. No congestion or rhinorrhea.     Mouth/Throat:     Mouth: Mucous membranes are moist.     Pharynx: Oropharynx  is clear. No oropharyngeal exudate or posterior oropharyngeal erythema.  Eyes:     Extraocular Movements: Extraocular movements intact.     Conjunctiva/sclera: Conjunctivae normal.     Pupils: Pupils are equal, round, and reactive to light.  Neck:   Musculoskeletal:     Cervical back: Neck supple. Tenderness present.  Lymphadenopathy:     Cervical: Cervical adenopathy present.  Neurological:     Mental Status: She is alert and oriented to person, place, and time. Mental status is at baseline.  Psychiatric:        Mood and Affect: Mood normal.  Behavior: Behavior normal.        Thought Content: Thought content normal.        Judgment: Judgment normal.     No results found. No results found. No results found for this or any previous visit (from the past 24 hour(s)).  Assessment/Plan: Rita Levine is a 71 y.o. female present for OV for  Enlarged lymph nodes/throat discomfort/dysphagia Hard, mobile enlarged node palpated left anterior cervical mid jugular region.  Smaller but present right sided lymph node in the same area.  Patient feels it is enlarging, has been present greater than 4 weeks. Discussed obtaining neck ultrasound to better evaluate nodes and referral to gastroenterology for the dysphagia.  Patient is agreement today. Patient was encouraged to continue PPI, Pepcid and her normal allergy regimen.  Exam today showed mild postnasal drip otherwise no overt allergy signs. - US Soft Tissue Head/Neck (NON-THYROID); Future - Ambulatory referral to Gastroenterology  Reviewed expectations re: course of current medical issues. Discussed self-management of symptoms. Outlined signs and symptoms indicating need for more acute intervention. Patient verbalized understanding and all questions were answered. Patient received an After-Visit Summary.    No orders of the defined types were placed in this encounter.  No orders of the defined types were placed in this  encounter.  Referral Orders  No referral(s) requested today     Note is dictated utilizing voice recognition software. Although note has been proof read prior to signing, occasional typographical errors still can be missed. If any questions arise, please do not hesitate to call for verification.   electronically signed by:  Howard Pouch, DO  [place]

## 2021-12-11 NOTE — Telephone Encounter (Signed)
Spoke with pt regarding [place] order. Per pt visit, pt needed to have the order on lymph nodes. Pt states that she has thyroid issues and do not understand why it takes non thyroid. Pt understand that those are two different orders and we are looking based of of complaint during visit.   Note from [place] 12/22/20:  Ma Hillock, DO      8:51 AM Note   Please inform patient the following information: Good news, her thyroid nodule is stable and does not need ant future imaging.

## 2021-12-14 ENCOUNTER — Ambulatory Visit (INDEPENDENT_AMBULATORY_CARE_PROVIDER_SITE_OTHER): Payer: Medicare HMO

## 2021-12-14 ENCOUNTER — Other Ambulatory Visit: Payer: Self-pay

## 2021-12-14 ENCOUNTER — Telehealth: Payer: Self-pay | Admitting: Family Medicine

## 2021-12-14 DIAGNOSIS — R070 Pain in throat: Secondary | ICD-10-CM | POA: Diagnosis not present

## 2021-12-14 DIAGNOSIS — R599 Enlarged lymph nodes, unspecified: Secondary | ICD-10-CM

## 2021-12-14 DIAGNOSIS — R590 Localized enlarged lymph nodes: Secondary | ICD-10-CM | POA: Diagnosis not present

## 2021-12-14 DIAGNOSIS — R1310 Dysphagia, unspecified: Secondary | ICD-10-CM | POA: Diagnosis not present

## 2021-12-14 NOTE — Telephone Encounter (Signed)
Spoke with patient regarding results/recommendations.  

## 2021-12-14 NOTE — Telephone Encounter (Signed)
Please inform patient the following information: ?her neck ultrasound is normal.  No concerning features present. ? ?

## 2021-12-19 ENCOUNTER — Ambulatory Visit: Payer: Medicare HMO | Admitting: Physician Assistant

## 2021-12-19 ENCOUNTER — Ambulatory Visit: Payer: Medicare HMO | Admitting: Nurse Practitioner

## 2021-12-19 ENCOUNTER — Encounter: Payer: Self-pay | Admitting: Physician Assistant

## 2021-12-19 VITALS — BP 152/88 | HR 88 | Ht 59.0 in | Wt 139.5 lb

## 2021-12-19 DIAGNOSIS — R053 Chronic cough: Secondary | ICD-10-CM | POA: Diagnosis not present

## 2021-12-19 DIAGNOSIS — R1314 Dysphagia, pharyngoesophageal phase: Secondary | ICD-10-CM

## 2021-12-19 DIAGNOSIS — K219 Gastro-esophageal reflux disease without esophagitis: Secondary | ICD-10-CM | POA: Diagnosis not present

## 2021-12-19 MED ORDER — FAMOTIDINE 40 MG PO TABS: 40.0000 mg | ORAL_TABLET | Freq: Two times a day (BID) | ORAL | 5 refills | Status: DC

## 2021-12-19 NOTE — Progress Notes (Signed)
Chief Complaint: Dysphagia  HPI:    Rita Levine is a 71 year old female with a past medical history as listed below including reflux and dysphagia, known to Dr. Ardis Hughs, who was referred to me by Howard Pouch A, DO for a complaint of dysphagia.    08/05/2016 colonoscopy with diverticulosis and hemorrhoids and otherwise normal.  Repeat recommended 10 years.    12/08/2018 EGD normal.    07/04/2021 patient seen in clinic for generalized abdominal complaints.  At that time suspected IBS with recent flare of symptoms due to COVID.  She was started on MiraLAX and Benefiber.  Also increase Pantoprazole to twice daily and added Pepcid at bedtime.    10/25/2021 CBC normal.    12/14/2021 ultrasound soft tissue head and neck showed an area of palpable complaint correlates with the submandibular glands which are symmetric and unremarkable.    Today, the patient presents to clinic and tells me that over the past few months she has had gradual worsening of trouble swallowing.  Tells me it used to happen every once in a while when she felt like she would get food stuck on its way down and then over the past month or so this seems to happen with everything pills her saliva and pretty much anything.  She tells me it feels like it kind of gets hung in 1 spot and then will eventually go down.  Also tells me she feels like she just cannot eat as much at one time as she used to.  Along this has been experiencing some tenderness on the outside of her neck which has been evaluated by pulmonology, ENT and her PCP.  She recently had ultrasound as above.  Also describes cough and hoarseness which has been worked up as well and feels like it may be related to uncontrolled reflux even though she is on Pantoprazole 40 twice daily and Famotidine 20 twice daily.    Also describes recent surgery on her tongue and an infected molar which she has yet to be fixed.    Denies fever, chills, weight loss, blood in her stool or symptoms that  awaken her from sleep.  Past Medical History:  Diagnosis Date   Allergic rhinitis    Allergic rhinitis due to pollen 12/03/2019   Anemia    Asthma    Chest pain, unspecified 04/07/2014   Chicken pox    Colon polyp    Diverticulosis    Dysphagia    Dysrhythmia    Palpitations   Fibromyalgia    Food allergy    Gallstone    GERD (gastroesophageal reflux disease)    Hiatal hernia    Hypercholesteremia    Hypertension    Hypothyroidism    IBS (irritable bowel syndrome)    Ingrown toenail 08/23/2020   Migraine headache    occasionally   Osteoarthritis    Osteopenia    Pneumonia    PONV (postoperative nausea and vomiting)    Pulmonary hypertension ([place])    pt denies, pt states she was tested for it but was not diagnosed with it   Rectal bleeding    SBO (small bowel obstruction) ([place]) 10/20/2017   Seasonal allergies    Thyroid nodule    Trigger finger of left and right ring fingers 01/11/2021    Past Surgical History:  Procedure Laterality Date   APPENDECTOMY  1982   BREAST EXCISIONAL BIOPSY Right 1995   benign   BREAST LUMPECTOMY  1991   CARPAL TUNNEL RELEASE Right 2006  Right wrist   CESAREAN SECTION     x 2   CHOLECYSTECTOMY N/A 07/25/2020   Procedure: LAPAROSCOPIC CHOLECYSTECTOMY;  Surgeon: [name], MD;  Location: [facility];  Service: General;  Laterality: N/A;   [place] ([place] HX)  01/03/2020   IMAGE MRI brain:  04/2012   No focal IAC or inner ear lesion to explain hearing loss. Slightly greater than expected number of subcortical T2- hyperintensities bilaterally.  These are nonspecific.  They can be seen in the setting of chronic migraine headaches, demyelinating process, chronic microvascular ischemic, prior infection or inflammation, or vasculitis   IMAGE MRI lumbar:  05/01/2006   Mild central canal stenosis with facet arthropathy and mildly bulging disc at L4-5.  There is mild narrowing in the left lateral  recess at this level.  No definite neural impingement.  Appearance at this level has not markedly Moderately severe facet arthropathy at L5-S1 with mild interval progression of a diffuse broad based disc bulge.  There is mild biforaminal narrowing.  Central canal is open   LAPAROSCOPIC ABDOMINAL EXPLORATION  2019   adhesion removal> caused bowel blockage    NASAL SEPTUM SURGERY  2004   TONSILLECTOMY  1965   TOTAL ABDOMINAL HYSTERECTOMY W/ BILATERAL SALPINGOOPHORECTOMY  1988   TUBAL LIGATION  1974   UPPER GASTROINTESTINAL ENDOSCOPY  2020    Current Outpatient Medications  Medication Sig Dispense Refill   albuterol (PROAIR HFA) 108 (90 Base) MCG/ACT inhaler Inhale 2 puffs into the lungs every 6 (six) hours as needed. 6.7 g 5   albuterol (PROVENTIL) (2.5 MG/3ML) 0.083% nebulizer solution Take 3 mLs (2.5 mg total) by nebulization every 6 (six) hours as needed. 75 mL 0   ARMOUR THYROID 15 MG tablet Take 1 tablet by mouth in the AM Once a day 90 tablet 3   ARMOUR THYROID 30 MG tablet Take 1 tablet by mouth in the AM once a day 90 tablet 3   Ascorbic Acid (VITAMIN C) 1000 MG tablet Take 3,000 mg by mouth daily.     atenolol (TENORMIN) 25 MG tablet Take 0.5-1 tablets (12.5-25 mg total) by mouth daily. 90 tablet 1   budesonide (PULMICORT) 0.5 MG/2ML nebulizer solution Take 2 mLs (0.5 mg total) by nebulization in the morning and at bedtime. (Patient taking differently: Take 0.5 mg by nebulization as needed.) 20 mL 12   calcium carbonate (TUMS - DOSED IN MG ELEMENTAL CALCIUM) 500 MG chewable tablet Chew 3 tablets by mouth daily as needed for indigestion or heartburn.     Carboxymethylcellulose Sodium (THERATEARS OP) Place 1 drop into both eyes 2 (two) times daily.     Cholecalciferol (VITAMIN D) 125 MCG (5000 UT) CAPS Take 5,000 Units by mouth daily.      Coenzyme Q10 (COQ-10) 100 MG CAPS Take 100 mg by mouth daily.     ezetimibe (ZETIA) 10 MG tablet Take 1 tablet (10 mg total) by mouth daily. 90 tablet  3   famotidine (PEPCID) 20 MG tablet TAKE 1 TABLET BY MOUTH EVERYDAY AT BEDTIME 90 tablet 1   fexofenadine (ALLERGY RELIEF) 180 MG tablet Take 1 tablet (180 mg total) by mouth daily. 90 tablet 1   fluticasone (FLONASE) 50 MCG/ACT nasal spray Place 2 sprays into both nostrils daily. 16 mL 11   folic acid (FOLVITE) 1 MG tablet SMARTSIG:2 Tablet(s) By Mouth     hydrochlorothiazide (MICROZIDE) 12.5 MG capsule Take 1 capsule (12.5  mg total) by mouth daily as needed. 45 capsule 1   hyoscyamine (LEVSIN) 0.125 MG tablet Take 1 tablet (0.125 mg total) by mouth every 6 (six) hours as needed. 120 tablet 5   levocetirizine (XYZAL) 5 MG tablet Take 1 tablet (5 mg total) by mouth every evening. 90 tablet 3   meloxicam (MOBIC) 15 MG tablet Take 1 tablet (15 mg total) by mouth daily. 90 tablet 1   mometasone (ELOCON) 0.1 % lotion Apply 1 application topically daily as needed (ear irritation).      montelukast (SINGULAIR) 10 MG tablet TAKE 1 TABLET BY MOUTH EVERYDAY AT BEDTIME 90 tablet 1   mupirocin ointment (BACTROBAN) 2 % Apply 1 application topically 2 (two) times daily. 30 g 2   ondansetron (ZOFRAN-ODT) 8 MG disintegrating tablet Take 1 tablet (8 mg total) by mouth every 8 (eight) hours as needed for nausea or vomiting. 30 tablet 1   pantoprazole (PROTONIX) 40 MG tablet Take 1 tablet (40 mg total) by mouth 2 (two) times daily. 180 tablet 3   Probiotic CAPS Take 1 capsule by mouth daily.     rosuvastatin (CRESTOR) 10 MG tablet Take 1 tablet (10 mg total) by mouth 3 (three) times a week. 40 tablet 3   tiZANidine (ZANAFLEX) 4 MG tablet Take 1 tablet (4 mg total) by mouth every 6 (six) hours as needed for muscle spasms. 120 tablet 5   Turmeric (QC TUMERIC COMPLEX PO) Take by mouth.     valACYclovir (VALTREX) 1000 MG tablet 2 tabs at onset, rpt 2 tabs in 12 hours once. 20 tablet 1   VITAMIN A PO Take 1 tablet by mouth daily.     zinc gluconate 50 MG tablet Take 100 mg by mouth daily.     zolpidem (AMBIEN) 5 MG  tablet Take 1 tablet (5 mg total) by mouth at bedtime. As needed 90 tablet 1   No current facility-administered medications for this visit.    Allergies as of 12/19/2021 - Review Complete 12/10/2021  Allergen Reaction Noted   Sulfamethoxazole-trimethoprim Hives 03/29/2011   Avelox [moxifloxacin hcl in nacl]  03/29/2011   Cephalexin Hives 03/29/2011   Codeine Hives 03/29/2011   Erythromycin Hives 03/29/2011   Flagyl [metronidazole]  12/01/2019   Propranolol Hives 04/15/2016   Sulfa antibiotics Hives 03/29/2011   Tetanus toxoid Swelling 05/18/2016   Tramadol Hives 04/05/2014   Statins Other (See Comments) 12/03/2019    Family History  Problem Relation Age of Onset   Hyperlipidemia Father    Hypertension Father    Arthritis Father    Diabetes Father    Asthma Father    COPD Father    Early death Father    Parkinson's disease Father    Hypertension Mother    Hyperlipidemia Mother    Macular degeneration Mother    Arthritis Mother    Hearing loss Mother    Heart disease Mother    Kidney disease Mother    Throat cancer Brother        lung, and tongue   Arthritis Brother    COPD Brother    Diabetes type II Sister    COPD Sister    Heart disease Sister    Hypertension Sister    Thyroid cancer Sister    Lung cancer Sister    Early death Sister    COPD Sister    Breast cancer Maternal Aunt    Lung cancer Other        uncle   Emphysema Maternal  Aunt    Emphysema Maternal Uncle    Clotting disorder Sister    Brain cancer Sister        brain tumor- not malignant   Breast cancer Cousin    Cancer Sister    Alcohol abuse Sister    COPD Sister    Early death Sister    Alcohol abuse Brother    Arthritis Brother    Depression Brother    Diabetes Brother    Hypercholesterolemia Brother    Colon cancer Neg Hx    Esophageal cancer Neg Hx    Rectal cancer Neg Hx    Stomach cancer Neg Hx     Social History   Socioeconomic History   Marital status: Married     Spouse name: Rita Levine   Number of children: 2   Years of education: Not on file   Highest education level: Some college, no degree  Occupational History   Occupation: homemaker  Tobacco Use   Smoking status: Never   Smokeless tobacco: Never  Vaping Use   Vaping Use: Never used  Substance and Sexual Activity   Alcohol use: Yes    Alcohol/week: 0.0 standard drinks    Comment: occasionally   Drug use: No   Sexual activity: Yes    Partners: Male    Comment: married  Other Topics Concern   Not on file  Social History Narrative   Marital status/children/pets: married, 2 children   Education/employment: HS grad. retired   Engineer, materials:      -smoke alarm in the home:Yes     - wears seatbelt: Yes     - Feels safe in their relationships: Yes   Social Determinants of Health   Financial Resource Strain: Low Risk    Difficulty of Paying Living Expenses: Not hard at all  Food Insecurity: No Food Insecurity   Worried About Charity fundraiser in the Last Year: Never true   [place] in the Last Year: Never true  Transportation Needs: No Transportation Needs   Lack of Transportation (Medical): No   Lack of Transportation (Non-Medical): No  Physical Activity: Sufficiently Active   Days of Exercise per Week: 5 days   Minutes of Exercise per Session: 30 min  Stress: No Stress Concern Present   Feeling of Stress : Not at all  Social Connections: Socially Integrated   Frequency of Communication with Friends and Family: More than three times a week   Frequency of Social Gatherings with Friends and Family: More than three times a week   Attends Religious Services: More than 4 times per year   Active Member of [employer] or Organizations: Yes   Attends Music therapist: More than 4 times per year   Marital Status: Married  Human resources officer Violence: Not At Risk   Fear of Current or Ex-Partner: No   Emotionally Abused: No   Physically Abused: No   Sexually Abused: No     Review of Systems:    Constitutional: No weight loss, fever or chills Cardiovascular: No chest pain  Respiratory: No SOB  Gastrointestinal: See HPI and otherwise negative   Physical Exam:  Vital signs: BP (!) 152/88 (BP Location: Left Arm, Patient Position: Sitting, Cuff Size: Normal)    Pulse 88    Ht '4\' 11"'$  (1.499 m)    Wt 139 lb 8 oz (63.3 kg)    SpO2 99%    BMI 28.18 kg/m    Constitutional:   Pleasant Caucasian female  appears to be in NAD, Well developed, Well nourished, alert and cooperative Respiratory: Respirations even and unlabored. Lungs clear to auscultation bilaterally.   No wheezes, crackles, or rhonchi.  Cardiovascular: Normal S1, S2. No MRG. Regular rate and rhythm. No peripheral edema, cyanosis or pallor.  Gastrointestinal:  Soft, nondistended, nontender. No rebound or guarding. Normal bowel sounds. No appreciable masses or hepatomegaly. Rectal:  Not performed.  Psychiatric: Demonstrates good judgement and reason without abnormal affect or behaviors.  RELEVANT LABS AND IMAGING: CBC    Component Value Date/Time   WBC 7.1 10/25/2021 0950   RBC 4.59 10/25/2021 0950   HGB 12.9 10/25/2021 0950   HCT 39.5 10/25/2021 0950   PLT 253.0 10/25/2021 0950   MCV 85.9 10/25/2021 0950   MCH 28.3 04/28/2021 0934   MCHC 32.8 10/25/2021 0950   RDW 13.3 10/25/2021 0950   LYMPHSABS 1.4 10/25/2021 0950   MONOABS 0.7 10/25/2021 0950   EOSABS 0.1 10/25/2021 0950   BASOSABS 0.1 10/25/2021 0950    CMP     Component Value Date/Time   NA 134 (L) 04/28/2021 0934   NA 138 01/04/2021 1001   K 4.3 04/28/2021 0934   CL 102 04/28/2021 0934   CO2 23 04/28/2021 0934   GLUCOSE 148 (H) 04/28/2021 0934   BUN 8 04/28/2021 0934   BUN 12 01/04/2021 1001   CREATININE 0.67 04/28/2021 0934   CREATININE 0.73 11/28/2020 1342   CALCIUM 9.0 04/28/2021 0934   PROT 7.6 04/28/2021 0934   PROT 6.5 01/04/2021 1001   ALBUMIN 4.2 04/28/2021 0934   ALBUMIN 4.3 01/04/2021 1001   AST 48 (H)  04/28/2021 0934   ALT 36 04/28/2021 0934   ALKPHOS 89 04/28/2021 0934   BILITOT 0.8 04/28/2021 0934   BILITOT 0.3 01/04/2021 1001   GFRNONAA >60 04/28/2021 0934    Assessment: 1.  Dysphagia: Recent ultrasound of the neck unrevealing, patient describes that food and even her saliva seems like it gets hung in her throat on the way down worse over the past few weeks, also uncontrolled reflux; consider esophageal stricture versus dysmotility versus other 2.  Cough/hoarseness: Consider relation to above 3.  GERD: Describes breakthrough symptoms on Pantoprazole 40 twice daily and Famotidine 20 twice daily, last EGD in 2020 really unrevealing  Plan: 1.  Scheduled patient for barium swallow with tablet for further evaluation of symptoms. 2.  Pending above discussed possible EGD with dilation. 3.  Increased Famotidine to 40 mg twice daily, every morning and nightly #60 with 5 refills. 4.  Continue Pantoprazole 40 twice daily 5.  Reviewed antidysphagia measures. 6.  Patient follow in clinic per recommendations after swallow study.  Ellouise Newer, PA-C [facility] 12/19/2021, 3:32 PM  Cc: Ma Hillock, DO

## 2021-12-19 NOTE — Patient Instructions (Signed)
If you are age 71 or older, your body mass index should be between 23-30. Your Body mass index is 28.18 kg/m?Marland Kitchen If this is out of the aforementioned range listed, please consider follow up with your Primary Care Provider. ?________________________________________________________ ? ?The [facility] providers would like to encourage you to use [facility] to communicate with providers for non-urgent requests or questions.  Due to long hold times on the telephone, sending your provider a message by [facility] may be a faster and more efficient way to get a response.  Please allow 48 business hours for a response.  Please remember that this is for non-urgent requests.  ?_______________________________________________________ ? ?We have sent the following medications to your pharmacy for you to pick up at your convenience: ? ?INCREASE: famotidine '40mg'$  one tablet twice daily  ? ?You have been scheduled for a Barium Esophogram at [facility] (1st floor of the hospital) on 12-31-21 at 10:30am. Please arrive 30 minutes prior to your appointment for registration. Make certain not to have anything to eat or drink 3 hours prior to your test. If you need to reschedule for any reason, please contact radiology at [phone] to do so. ?__________________________________________________________________ ?A barium swallow is an examination that concentrates on views of the esophagus. This tends to be a double contrast exam (barium and two liquids which, when combined, create a gas to distend the wall of the oesophagus) or single contrast (non-ionic iodine based). The study is usually tailored to your symptoms so a good history is essential. Attention is paid during the study to the form, structure and configuration of the esophagus, looking for functional disorders (such as aspiration, dysphagia, achalasia, motility and reflux) ? ?EXAMINATION ? ?You may be asked to change into a gown, depending on the type of swallow being  performed. A radiologist and radiographer will perform the procedure. The radiologist will advise you of the type of contrast selected for your procedure and direct you during the exam. You will be asked to stand, sit or lie in several different positions and to hold a small amount of fluid in your mouth before being asked to swallow while the imaging is performed .In some instances you may be asked to swallow barium coated marshmallows to assess the motility of a solid food bolus. ? ?The exam can be recorded as a digital or video fluoroscopy procedure. ? ?POST PROCEDURE ?It will take 1-2 days for the barium to pass through your system. To facilitate this, it is important, unless otherwise directed, to increase your fluids for the next 24-48hrs and to resume your normal diet.  ?This test typically takes about 30 minutes to perform. ?__________________________________________________________________________________ ? ?Due to recent changes in healthcare laws, you may see the results of your imaging and laboratory studies on MyChart before your provider has had a chance to review them.  We understand that in some cases there may be results that are confusing or concerning to you. Not all laboratory results come back in the same time frame and the provider may be waiting for multiple results in order to interpret others.  Please give [place] 48 hours in order for your provider to thoroughly review all the results before contacting the office for clarification of your results.  ? ?Thank you for entrusting me with your care and choosing [facility]. ? ?Ellouise Newer, PA-C ? ?
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
## 2021-12-20 ENCOUNTER — Ambulatory Visit (INDEPENDENT_AMBULATORY_CARE_PROVIDER_SITE_OTHER): Payer: Medicare HMO

## 2021-12-20 ENCOUNTER — Ambulatory Visit (INDEPENDENT_AMBULATORY_CARE_PROVIDER_SITE_OTHER): Payer: Medicare HMO | Admitting: Sports Medicine

## 2021-12-20 ENCOUNTER — Other Ambulatory Visit: Payer: Self-pay

## 2021-12-20 VITALS — BP 130/70 | HR 82 | Ht 59.0 in | Wt 139.0 lb

## 2021-12-20 DIAGNOSIS — M542 Cervicalgia: Secondary | ICD-10-CM | POA: Diagnosis not present

## 2021-12-20 DIAGNOSIS — G8929 Other chronic pain: Secondary | ICD-10-CM

## 2021-12-20 DIAGNOSIS — M5135 Other intervertebral disc degeneration, thoracolumbar region: Secondary | ICD-10-CM

## 2021-12-20 DIAGNOSIS — M9908 Segmental and somatic dysfunction of rib cage: Secondary | ICD-10-CM

## 2021-12-20 DIAGNOSIS — M546 Pain in thoracic spine: Secondary | ICD-10-CM | POA: Diagnosis not present

## 2021-12-20 DIAGNOSIS — M797 Fibromyalgia: Secondary | ICD-10-CM

## 2021-12-20 DIAGNOSIS — M9902 Segmental and somatic dysfunction of thoracic region: Secondary | ICD-10-CM | POA: Diagnosis not present

## 2021-12-20 DIAGNOSIS — M9901 Segmental and somatic dysfunction of cervical region: Secondary | ICD-10-CM

## 2021-12-20 DIAGNOSIS — M40204 Unspecified kyphosis, thoracic region: Secondary | ICD-10-CM | POA: Diagnosis not present

## 2021-12-20 DIAGNOSIS — M545 Low back pain, unspecified: Secondary | ICD-10-CM

## 2021-12-20 NOTE — Progress Notes (Signed)
I agree with the above note, plan 

## 2021-12-20 NOTE — Patient Instructions (Addendum)
Good to see you  ?Recommend using Tylenol 500 mg 2-3 times a day for pain relief  ?Can use meloxicam as needed for pain relief recommend no more than 1-2 times per week ?Follow up as needed  ? ?

## 2021-12-28 ENCOUNTER — Telehealth: Payer: Self-pay | Admitting: Sports Medicine

## 2021-12-28 ENCOUNTER — Other Ambulatory Visit: Payer: Self-pay | Admitting: Sports Medicine

## 2021-12-28 MED ORDER — DULOXETINE HCL 30 MG PO CPEP: 30.0000 mg | ORAL_CAPSULE | Freq: Every day | ORAL | 0 refills | Status: DC

## 2021-12-28 NOTE — Telephone Encounter (Signed)
Spoke with patient and let her know that the duloxetine '30mg'$  was sent in to her [facility] ?

## 2021-12-28 NOTE — Telephone Encounter (Signed)
Patient called to let Dr Glennon Mac know that she would like to start the Duloxetine. ?Pharmacy: [facility] ? ? ?

## 2021-12-31 ENCOUNTER — Ambulatory Visit (HOSPITAL_COMMUNITY)
Admission: RE | Admit: 2021-12-31 | Discharge: 2021-12-31 | Disposition: A | Discharge status: Home / Self Care | Payer: Medicare HMO | Source: Ambulatory Visit | Attending: Physician Assistant | Admitting: Physician Assistant

## 2021-12-31 ENCOUNTER — Other Ambulatory Visit: Payer: Self-pay

## 2021-12-31 DIAGNOSIS — R1314 Dysphagia, pharyngoesophageal phase: Secondary | ICD-10-CM

## 2021-12-31 DIAGNOSIS — K219 Gastro-esophageal reflux disease without esophagitis: Secondary | ICD-10-CM | POA: Diagnosis not present

## 2021-12-31 DIAGNOSIS — R1310 Dysphagia, unspecified: Secondary | ICD-10-CM | POA: Diagnosis not present

## 2021-12-31 NOTE — Telephone Encounter (Signed)
ERROR

## 2022-01-03 ENCOUNTER — Other Ambulatory Visit: Payer: Self-pay

## 2022-01-03 DIAGNOSIS — R1314 Dysphagia, pharyngoesophageal phase: Secondary | ICD-10-CM

## 2022-01-03 DIAGNOSIS — K224 Dyskinesia of esophagus: Secondary | ICD-10-CM

## 2022-01-07 ENCOUNTER — Other Ambulatory Visit: Payer: Self-pay

## 2022-01-07 DIAGNOSIS — R1314 Dysphagia, pharyngoesophageal phase: Secondary | ICD-10-CM

## 2022-01-07 DIAGNOSIS — R053 Chronic cough: Secondary | ICD-10-CM

## 2022-01-07 DIAGNOSIS — K224 Dyskinesia of esophagus: Secondary | ICD-10-CM

## 2022-01-07 DIAGNOSIS — K219 Gastro-esophageal reflux disease without esophagitis: Secondary | ICD-10-CM

## 2022-01-10 ENCOUNTER — Telehealth: Payer: Self-pay | Admitting: Gastroenterology

## 2022-01-10 NOTE — Telephone Encounter (Signed)
Called hospital scheduling to see if there have been any cancellations for a sooner esophageal manometry appt. No cancellations, they are actually now booking into July. I called the pt and informed her of this information. I told pt that these studies are only completed a couple of days out of the week and appts are limited. I told pt that I understand that she would prefer to have a sooner appt, I told pt to call back periodically and we can check with the hospital about any sooner dates. Pt verbalized understanding and had no concerns at the end of the call. ?

## 2022-01-10 NOTE — Telephone Encounter (Signed)
Pt last saw Anderson Malta  ?

## 2022-01-10 NOTE — Telephone Encounter (Signed)
Patient called and wanted to know if there had been any cancellations for the esophageal manometry.  She'd like to do it earlier than June if there have been any cancellations.  Please call patient and advise. ?

## 2022-01-17 ENCOUNTER — Ambulatory Visit: Payer: Medicare HMO | Admitting: Sports Medicine

## 2022-01-22 ENCOUNTER — Telehealth: Payer: Self-pay | Admitting: Sports Medicine

## 2022-01-22 ENCOUNTER — Other Ambulatory Visit: Payer: Self-pay | Admitting: Sports Medicine

## 2022-01-22 NOTE — Telephone Encounter (Signed)
Pt was called and she said that she will start taking her muscle relaxer at night instead ?

## 2022-01-22 NOTE — Telephone Encounter (Signed)
Patient called stating that she is not able to take the Duloxetine because it has increased her tremors.  ?Anything else she can try? ? ?Please advise. ? ?

## 2022-01-23 NOTE — Telephone Encounter (Signed)
Called hospital scheduling to see if there have been any cancellations for a sooner e-mano appt. No sooner appts than what pt already has scheduled. I called pt and informed her of this information. Pt had questions regarding being NPO after midnight the night before. She states that her mouth and throat stay so dry and she drinks water throughout the night when she gets up. I told pt that those are the hospital instructions and their requirements for the test. I told her that if there is a cancellation it may be a morning appt. Pt verbalized understanding and thanked me for my call.  ?

## 2022-01-23 NOTE — Telephone Encounter (Signed)
Patient called to check for cancellations for sooner esophageal manometry appt. Patient also states that she has questions regarding being NPO. Please advise.  ?

## 2022-01-25 ENCOUNTER — Other Ambulatory Visit: Payer: Self-pay | Admitting: Interventional Cardiology

## 2022-01-25 ENCOUNTER — Other Ambulatory Visit: Payer: Self-pay | Admitting: Family Medicine

## 2022-01-28 NOTE — Telephone Encounter (Signed)
Called hospital scheduling to see if there have been any cancellations for a sooner esophageal manometry appt. No cancellations at this time.  ?

## 2022-01-31 ENCOUNTER — Other Ambulatory Visit: Payer: Self-pay | Admitting: Family Medicine

## 2022-01-31 NOTE — Telephone Encounter (Addendum)
[place],  ?I spoke with her on the phone.  She is pretty miserable with dysphagia.  I think we should evaluate with EGD plus minus dilation, possibly biopsies of the esophagus.  Can you please offer her my [place] spot next Wednesday afternoon that is still open, 26th.  Thank you  ? ?Called and spoke with patient regarding scheduling EGD. Pt has been scheduled for EGD in the [facility] with Dr. Ardis Hughs on Tuesday, 02/05/22 at 9:30 am. Pt is aware that she will need to arrive at 8:30 am with a care partner. Pt is aware that I will send her instructions to her via [place]. Pt verbalized understanding of all information and had no concerns at the end of the call. ? ?Ambulatory referral to GI in epic. Secure staff message sent to [facility] to let her know about appt add on. ?

## 2022-02-05 ENCOUNTER — Encounter: Payer: Self-pay | Admitting: Gastroenterology

## 2022-02-05 ENCOUNTER — Ambulatory Visit (AMBULATORY_SURGERY_CENTER): Payer: Medicare HMO | Admitting: Gastroenterology

## 2022-02-05 VITALS — BP 122/70 | HR 67 | Temp 98.2°F | Resp 16 | Ht 59.0 in | Wt 139.0 lb

## 2022-02-05 DIAGNOSIS — R1310 Dysphagia, unspecified: Secondary | ICD-10-CM | POA: Diagnosis not present

## 2022-02-05 DIAGNOSIS — K449 Diaphragmatic hernia without obstruction or gangrene: Secondary | ICD-10-CM

## 2022-02-05 DIAGNOSIS — R1314 Dysphagia, pharyngoesophageal phase: Secondary | ICD-10-CM

## 2022-02-05 DIAGNOSIS — B3781 Candidal esophagitis: Secondary | ICD-10-CM

## 2022-02-05 MED ORDER — FLUCONAZOLE 100 MG PO TABS: 100.0000 mg | ORAL_TABLET | Freq: Every day | ORAL | 2 refills | Status: DC

## 2022-02-05 MED ORDER — SODIUM CHLORIDE 0.9 % IV SOLN: 500.0000 mL | Freq: Once | INTRAVENOUS | Status: DC

## 2022-02-05 NOTE — Op Note (Signed)
[place] ?Patient Name: Rita Levine ?Procedure Date: 02/05/2022 9:07 AM ?MRN: [id number] ?Endoscopist: Milus Banister , MD ?Age: 71 ?Referring MD:  ?Date of Birth: [birth year] ?Gender: Female ?Account #: [id number] ?Procedure:                Upper GI endoscopy ?Indications:              Dysphagia, Odynophagia ?Medicines:                Monitored Anesthesia Care ?Procedure:                Pre-Anesthesia Assessment: ?                          - Prior to the procedure, a History and Physical  ?                          was performed, and patient medications and  ?                          allergies were reviewed. The patient's tolerance of  ?                          previous anesthesia was also reviewed. The risks  ?                          and benefits of the procedure and the sedation  ?                          options and risks were discussed with the patient.  ?                          All questions were answered, and informed consent  ?                          was obtained. Prior Anticoagulants: The patient has  ?                          taken no previous anticoagulant or antiplatelet  ?                          agents. ASA Grade Assessment: II - A patient with  ?                          mild systemic disease. After reviewing the risks  ?                          and benefits, the patient was deemed in  ?                          satisfactory condition to undergo the procedure. ?                          After obtaining informed consent, the endoscope was  ?  passed under direct vision. Throughout the  ?                          procedure, the patient's blood pressure, pulse, and  ?                          oxygen saturations were monitored continuously. The  ?                          GIF [id number] #[id number] was introduced through the  ?                          mouth, and advanced to the pylorus. The upper GI  ?                          endoscopy was accomplished without  difficulty. The  ?                          patient tolerated the procedure well. ?Scope In: ?Scope Out: ?Findings:                 Diffuse, white plaques were found in the entire  ?                          esophagus. ?                          A small hiatal hernia was present. ?                          The examination was otherwise normal. ?Complications:            No immediate complications. Estimated blood loss:  ?                          None. ?Estimated Blood Loss:     Estimated blood loss: none. ?Impression:               - White esophageal plaques were found, consistent  ?                          with candidiasis. ?                          - Small hiatal hernia. ?                          - No specimens collected. ?Recommendation:           - Patient has a contact number available for  ?                          emergencies. The signs and symptoms of potential  ?                          delayed complications were discussed with the  ?  patient. Return to normal activities tomorrow.  ?                          Written discharge instructions were provided to the  ?                          patient. ?                          - Resume previous diet. ?                          - Continue present medications. ?                          - New start diflucan '100mg'$  pills, take one pill  ?                          once daily for 10 days, disp 10 pills with 2  ?                          refills. ?Milus Banister, MD ?02/05/2022 9:28:49 AM ?This report has been signed electronically. ?

## 2022-02-05 NOTE — Progress Notes (Signed)
Pt in recovery with monitors in place, VSS. Report given to receiving RN. Bite guard was placed with pt awake to ensure comfort. No dental or soft tissue damage noted. 

## 2022-02-05 NOTE — Patient Instructions (Signed)
Take Diflucan 100 mg daily- one pill for 10 days ? ?Please read over handout about hiatal hernias ? ?Continue your normal medications ? ? ?YOU HAD AN ENDOSCOPIC PROCEDURE TODAY AT [facility]:   Refer to the procedure report that was given to you for any specific questions about what was found during the examination.  If the procedure report does not answer your questions, please call your gastroenterologist to clarify.  If you requested that your care partner not be given the details of your procedure findings, then the procedure report has been included in a sealed envelope for you to review at your convenience later. ? ?YOU SHOULD EXPECT: Some feelings of bloating in the abdomen. Passage of more gas than usual.  Walking can help get rid of the air that was put into your GI tract during the procedure and reduce the bloating.  ? ?Please Note:  You might notice some irritation and congestion in your nose or some drainage.  This is from the oxygen used during your procedure.  There is no need for concern and it should clear up in a day or so. ? ?SYMPTOMS TO REPORT IMMEDIATELY: ? ?Following upper endoscopy (EGD) ? Vomiting of blood or coffee ground material ? New chest pain or pain under the shoulder blades ? Painful or persistently difficult swallowing ? New shortness of breath ? Fever of 100?F or higher ? Black, tarry-looking stools ? ?For urgent or emergent issues, a gastroenterologist can be reached at any hour by calling [phone]. ?Do not use MyChart messaging for urgent concerns.  ? ? ?DIET:  We do recommend a small meal at first, but then you may proceed to your regular diet.  Drink plenty of fluids but you should avoid alcoholic beverages for 24 hours. ? ?ACTIVITY:  You should plan to take it easy for the rest of today and you should NOT DRIVE or use heavy machinery until tomorrow (because of the sedation medicines used during the test).   ? ?FOLLOW UP: ?Our staff will call the number  listed on your records 48-72 hours following your procedure to check on you and address any questions or concerns that you may have regarding the information given to you following your procedure. If we do not reach you, we will leave a message.  We will attempt to reach you two times.  During this call, we will ask if you have developed any symptoms of COVID 19. If you develop any symptoms (ie: fever, flu-like symptoms, shortness of breath, cough etc.) before then, please call [phone].  If you test positive for Covid 19 in the 2 weeks post procedure, please call and report this information to [place].   ? ?SIGNATURES/CONFIDENTIALITY: ?You and/or your care partner have signed paperwork which will be entered into your electronic medical record.  These signatures attest to the fact that that the information above on your After Visit Summary has been reviewed and is understood.  Full responsibility of the confidentiality of this discharge information lies with you and/or your care-partner. ? ?

## 2022-02-05 NOTE — Progress Notes (Signed)
HPI: ?This is a woman with dysphagia ? ?EGD 2020 for dysphagia was normal. Barium esophagram 2023 suggested motility disorder of esophagus, no strictures or stenosis. ? ? ?ROS: complete GI ROS as described in HPI, all other review negative. ? ?Constitutional:  No unintentional weight loss ? ? ?Past Medical History:  ?Diagnosis Date  ? Allergic rhinitis   ? Allergic rhinitis due to pollen 12/03/2019  ? Allergy   ? Anemia   ? Asthma   ? Cataract   ? Chest pain, unspecified 04/07/2014  ? Chicken pox   ? Colon polyp   ? Diverticulosis   ? Dysphagia   ? Dysrhythmia   ? Palpitations  ? Fibromyalgia   ? Food allergy   ? Gallstone   ? GERD (gastroesophageal reflux disease)   ? Hiatal hernia   ? Hypercholesteremia   ? Hypertension   ? Hypothyroidism   ? IBS (irritable bowel syndrome)   ? Ingrown toenail 08/23/2020  ? Migraine headache   ? occasionally  ? Osteoarthritis   ? Osteopenia   ? Pneumonia   ? PONV (postoperative nausea and vomiting)   ? Pulmonary hypertension ([place])   ? pt denies, pt states she was tested for it but was not diagnosed with it  ? Rectal bleeding   ? SBO (small bowel obstruction) ([place]) 10/20/2017  ? Seasonal allergies   ? Thyroid nodule   ? Trigger finger of left and right ring fingers 01/11/2021  ? ? ?Past Surgical History:  ?Procedure Laterality Date  ? APPENDECTOMY  1982  ? BREAST EXCISIONAL BIOPSY Right 1995  ? benign  ? BREAST LUMPECTOMY  1991  ? CARPAL TUNNEL RELEASE Right 2006  ? Right wrist  ? CESAREAN SECTION    ? x 2  ? CHOLECYSTECTOMY N/A 07/25/2020  ? Procedure: LAPAROSCOPIC CHOLECYSTECTOMY;  Surgeon: [name], MD;  Location: [facility];  Service: General;  Laterality: N/A;  ? [place]  ? COLONOSCOPY    ? CT ABDOMEN PELVIS W ([place] HX)  01/03/2020  ? IMAGE MRI brain:  04/2012  ? No focal IAC or inner ear lesion to explain hearing loss. Slightly greater than expected number of subcortical T2- hyperintensities bilaterally.  These are nonspecific.  They can be seen in the setting of  chronic migraine headaches, demyelinating process, chronic microvascular ischemic, prior infection or inflammation, or vasculitis  ? IMAGE MRI lumbar:  05/01/2006  ? Mild central canal stenosis with facet arthropathy and mildly bulging disc at L4-5.  There is mild narrowing in the left lateral recess at this level.  No definite neural impingement.  Appearance at this level has not markedly Moderately severe facet arthropathy at L5-S1 with mild interval progression of a diffuse broad based disc bulge.  There is mild biforaminal narrowing.  Central canal is open  ? LAPAROSCOPIC ABDOMINAL EXPLORATION  2019  ? adhesion removal> caused bowel blockage   ? NASAL SEPTUM SURGERY  2004  ? TONSILLECTOMY  1965  ? TOTAL ABDOMINAL HYSTERECTOMY W/ BILATERAL SALPINGOOPHORECTOMY  1988  ? TUBAL LIGATION  1974  ? UPPER GASTROINTESTINAL ENDOSCOPY  2020  ? ? ?Current Outpatient Medications  ?Medication Sig Dispense Refill  ? ARMOUR THYROID 15 MG tablet Take 1 tablet by mouth in the AM Once a day 90 tablet 3  ? ARMOUR THYROID 30 MG tablet Take 1 tablet by mouth in the AM once a day 90 tablet 3  ? Ascorbic Acid (VITAMIN C) 1000 MG tablet Take 3,000 mg by mouth daily.    ?  atenolol (TENORMIN) 25 MG tablet Take 0.5-1 tablets (12.5-25 mg total) by mouth daily. 90 tablet 1  ? Carboxymethylcellulose Sodium (THERATEARS OP) Place 1 drop into both eyes 2 (two) times daily.    ? Cholecalciferol (VITAMIN D) 125 MCG (5000 UT) CAPS Take 5,000 Units by mouth daily.     ? Coenzyme Q10 (COQ-10) 100 MG CAPS Take 100 mg by mouth daily.    ? ezetimibe (ZETIA) 10 MG tablet Take 1 tablet (10 mg total) by mouth daily. 90 tablet 3  ? famotidine (PEPCID) 40 MG tablet Take 1 tablet (40 mg total) by mouth 2 (two) times daily. 60 tablet 5  ? fexofenadine (ALLERGY RELIEF) 180 MG tablet Take 1 tablet (180 mg total) by mouth daily. 90 tablet 1  ? fluticasone (FLONASE) 50 MCG/ACT nasal spray Place 2 sprays into both nostrils daily. 16 mL 11  ? folic acid (FOLVITE) 1  MG tablet SMARTSIG:2 Tablet(s) By Mouth    ? hydrochlorothiazide (MICROZIDE) 12.5 MG capsule Take 1 capsule (12.5 mg total) by mouth daily as needed. 45 capsule 1  ? hyoscyamine (LEVSIN) 0.125 MG tablet Take 1 tablet (0.125 mg total) by mouth every 6 (six) hours as needed. 120 tablet 5  ? levocetirizine (XYZAL) 5 MG tablet Take 1 tablet (5 mg total) by mouth every evening. 90 tablet 3  ? montelukast (SINGULAIR) 10 MG tablet TAKE 1 TABLET BY MOUTH EVERYDAY AT BEDTIME 90 tablet 1  ? pantoprazole (PROTONIX) 40 MG tablet Take 1 tablet (40 mg total) by mouth 2 (two) times daily. 180 tablet 3  ? Probiotic CAPS Take 1 capsule by mouth daily.    ? rosuvastatin (CRESTOR) 10 MG tablet TAKE 1 TABLET BY MOUTH 3 TIMES A WEEK. 36 tablet 0  ? SODIUM FLUORIDE 5000 PPM 1.1 % PSTE Take by mouth daily.    ? SYMBICORT 80-4.5 MCG/ACT inhaler SMARTSIG:2 Puff(s) Via Inhaler Morning-Night    ? Turmeric (QC TUMERIC COMPLEX PO) Take by mouth.    ? VITAMIN A PO Take 1 tablet by mouth daily.    ? zinc gluconate 50 MG tablet Take 100 mg by mouth daily.    ? zolpidem (AMBIEN) 5 MG tablet Take 1 tablet (5 mg total) by mouth at bedtime. As needed 90 tablet 1  ? albuterol (PROAIR HFA) 108 (90 Base) MCG/ACT inhaler Inhale 2 puffs into the lungs every 6 (six) hours as needed. 6.7 g 5  ? albuterol (PROVENTIL) (2.5 MG/3ML) 0.083% nebulizer solution Take 3 mLs (2.5 mg total) by nebulization every 6 (six) hours as needed. 75 mL 0  ? amoxicillin (AMOXIL) 500 MG capsule Take by mouth. (Patient not taking: Reported on 02/05/2022)    ? budesonide (PULMICORT) 0.25 MG/2ML nebulizer solution Take by nebulization 2 (two) times daily.    ? calcium carbonate (TUMS - DOSED IN MG ELEMENTAL CALCIUM) 500 MG chewable tablet Chew 3 tablets by mouth daily as needed for indigestion or heartburn.    ? meloxicam (MOBIC) 15 MG tablet Take 1 tablet (15 mg total) by mouth daily. 90 tablet 1  ? mometasone (ELOCON) 0.1 % lotion Apply 1 application topically daily as needed (ear  irritation).     ? mupirocin ointment (BACTROBAN) 2 % Apply 1 application topically 2 (two) times daily. 30 g 2  ? ondansetron (ZOFRAN-ODT) 8 MG disintegrating tablet Take 1 tablet (8 mg total) by mouth every 8 (eight) hours as needed for nausea or vomiting. 30 tablet 1  ? tiZANidine (ZANAFLEX) 4 MG tablet Take 1  tablet (4 mg total) by mouth every 6 (six) hours as needed for muscle spasms. 120 tablet 5  ? valACYclovir (VALTREX) 1000 MG tablet 2 tabs at onset, rpt 2 tabs in 12 hours once. 20 tablet 1  ? ?Current Facility-Administered Medications  ?Medication Dose Route Frequency Provider Last Rate Last Admin  ? 0.9 %  sodium chloride infusion  500 mL Intravenous Once Milus Banister, MD      ? ? ?Allergies as of 02/05/2022 - Review Complete 02/05/2022  ?Allergen Reaction Noted  ? Sulfamethoxazole-trimethoprim Hives 03/29/2011  ? Avelox [moxifloxacin hcl in nacl]  03/29/2011  ? Cephalexin Hives 03/29/2011  ? Codeine Hives 03/29/2011  ? Erythromycin Hives 03/29/2011  ? Flagyl [metronidazole]  12/01/2019  ? Propranolol Hives 04/15/2016  ? Sulfa antibiotics Hives 03/29/2011  ? Tetanus toxoid Swelling 05/18/2016  ? Tramadol Hives 04/05/2014  ? Statins Other (See Comments) 12/03/2019  ? ? ?Family History  ?Problem Relation Age of Onset  ? Hyperlipidemia Father   ? Hypertension Father   ? Arthritis Father   ? Diabetes Father   ? Asthma Father   ? COPD Father   ? Early death Father   ? Parkinson's disease Father   ? Hypertension Mother   ? Hyperlipidemia Mother   ? Macular degeneration Mother   ? Arthritis Mother   ? Hearing loss Mother   ? Heart disease Mother   ? Kidney disease Mother   ? Throat cancer Brother   ?     lung, and tongue  ? Arthritis Brother   ? COPD Brother   ? Diabetes type II Sister   ? COPD Sister   ? Heart disease Sister   ? Hypertension Sister   ? Thyroid cancer Sister   ? Lung cancer Sister   ? Early death Sister   ? COPD Sister   ? Breast cancer Maternal Aunt   ? Lung cancer Other   ?     uncle  ?  Emphysema Maternal Aunt   ? Emphysema Maternal Uncle   ? Clotting disorder Sister   ? Brain cancer Sister   ?     brain tumor- not malignant  ? Breast cancer Cousin   ? Cancer Sister   ? Alcohol abuse Si

## 2022-02-06 ENCOUNTER — Encounter: Payer: Self-pay | Admitting: Family Medicine

## 2022-02-07 ENCOUNTER — Telehealth: Payer: Self-pay

## 2022-02-07 ENCOUNTER — Other Ambulatory Visit: Payer: Self-pay | Admitting: Family Medicine

## 2022-02-07 NOTE — Telephone Encounter (Signed)
Received a transferred call from the [facility] regarding questions this pt has about GI cocktail.  She would like to have a prescription sent.  She is aware that Dr Ardis Hughs is out of the office until tomorrow.  Dr Ardis Hughs please advise.   ?

## 2022-02-07 NOTE — Telephone Encounter (Signed)
Attempted to reach patient for post-procedure f/u call. No answer. Left message. ?

## 2022-02-07 NOTE — Telephone Encounter (Signed)
?  Follow up Call- ? ? ?  02/05/2022  ?  8:48 AM  ?Call back number  ?Post procedure Call Back phone  # [phone]  ?Permission to leave phone message Yes  ?  ? ?Patient questions: ? ?Do you have a fever, pain , or abdominal swelling? No. ?Pain Score  0 * ? ?Have you tolerated food without any problems? Yes.   ? ?Have you been able to return to your normal activities? Yes.   ? ?Do you have any questions about your discharge instructions: ?Diet   No. ?Medications  No. ?Follow up visit  No. ? ?Do you have questions or concerns about your Care? Yes.   ? ?Patient asks if she can be prescribed a "GI cocktail" and also wants to verify that she can proceed with her dental procedure tomorrow. States she sent a Pharmacist, community message but hasn't received a reply as of yet. Transferred patient's call downstairs to Patty in the GI offices to follow up on this with patient. ? ?Actions: ?* If pain score is 4 or above: ?No action needed, pain <4. ? ? ?

## 2022-02-07 NOTE — Telephone Encounter (Signed)
Please advise if okay to change rx from 45 to 90 d/s ?

## 2022-02-08 NOTE — Telephone Encounter (Signed)
Left message on machine to call back  

## 2022-02-11 NOTE — Telephone Encounter (Signed)
The pt returned call and has been advised to call back in a few weeks if the diflucan is not helping.  The pt has been advised of the information and verbalized understanding.    ?

## 2022-02-11 NOTE — Telephone Encounter (Signed)
Left message on machine to call back  

## 2022-02-15 DIAGNOSIS — H524 Presbyopia: Secondary | ICD-10-CM | POA: Diagnosis not present

## 2022-02-15 DIAGNOSIS — Z0100 Encounter for examination of eyes and vision without abnormal findings: Secondary | ICD-10-CM | POA: Diagnosis not present

## 2022-02-15 LAB — HM DIABETES EYE EXAM

## 2022-03-04 ENCOUNTER — Ambulatory Visit (INDEPENDENT_AMBULATORY_CARE_PROVIDER_SITE_OTHER): Payer: Medicare HMO

## 2022-03-04 ENCOUNTER — Ambulatory Visit (INDEPENDENT_AMBULATORY_CARE_PROVIDER_SITE_OTHER): Payer: Medicare HMO | Admitting: Sports Medicine

## 2022-03-04 DIAGNOSIS — M1711 Unilateral primary osteoarthritis, right knee: Secondary | ICD-10-CM | POA: Diagnosis not present

## 2022-03-04 DIAGNOSIS — M25561 Pain in right knee: Secondary | ICD-10-CM | POA: Diagnosis not present

## 2022-03-04 DIAGNOSIS — Z09 Encounter for follow-up examination after completed treatment for conditions other than malignant neoplasm: Secondary | ICD-10-CM

## 2022-03-04 NOTE — Assessment & Plan Note (Signed)
Very pleasant 71 year old female, long history of knee osteoarthritis, last injected about 7 years ago. More recently she is having increasing medial joint line pain, patellofemoral pain, moderate gelling. She has tried meloxicam without significant improvement. Today we did a knee injection with ultrasound guidance, I would like some baseline x-rays. Return in a month, if not significantly better we will proceed with MRI to ensure were not missing a subchondral insufficiency fracture. If MRI shows mostly osteoarthritis and degenerative meniscal tearing we will proceed with viscosupplementation after that.

## 2022-03-04 NOTE — Progress Notes (Signed)
    Procedures performed today:    Procedure: Real-time Ultrasound Guided injection of the right knee Device: Samsung HS60  Verbal informed consent obtained.  Time-out conducted.  Noted no overlying erythema, induration, or other signs of local infection.  Skin prepped in a sterile fashion.  Local anesthesia: Topical Ethyl chloride.  With sterile technique and under real time ultrasound guidance: Trace effusion noted, 1 cc Kenalog 40, 2 cc lidocaine, 2 cc bupivacaine injected easily Completed without difficulty  Advised to call if fevers/chills, erythema, induration, drainage, or persistent bleeding.  Images permanently stored and available for review in PACS.  Impression: Technically successful ultrasound guided injection.  Independent interpretation of notes and tests performed by another provider:   None.  Brief History, Exam, Impression, and Recommendations:    Primary osteoarthritis of right knee Very pleasant 71 year old female, long history of knee osteoarthritis, last injected about 7 years ago. More recently she is having increasing medial joint line pain, patellofemoral pain, moderate gelling. She has tried meloxicam without significant improvement. Today we did a knee injection with ultrasound guidance, I would like some baseline x-rays. Return in a month, if not significantly better we will proceed with MRI to ensure were not missing a subchondral insufficiency fracture. If MRI shows mostly osteoarthritis and degenerative meniscal tearing we will proceed with viscosupplementation after that.    ___________________________________________ Gwen Her. Dianah Field, M.D., ABFM., CAQSM. Primary Care and [place] Instructor of [place] [facility]

## 2022-03-14 ENCOUNTER — Telehealth: Payer: Self-pay | Admitting: Gastroenterology

## 2022-03-14 DIAGNOSIS — R1314 Dysphagia, pharyngoesophageal phase: Secondary | ICD-10-CM

## 2022-03-14 NOTE — Telephone Encounter (Signed)
Patient called requested to speak with a  nurse regarding her condition. She said her throat is starting to get the white strikes again and she is wondering if there is something she should do for it.

## 2022-03-14 NOTE — Telephone Encounter (Signed)
Left message on machine to call back  

## 2022-03-15 MED ORDER — FLUCONAZOLE 100 MG PO TABS: 100.0000 mg | ORAL_TABLET | Freq: Every day | ORAL | 0 refills | Status: AC

## 2022-03-15 NOTE — Telephone Encounter (Signed)
The pt has a history of candidiasis on April EGD.  Took diflucan and improved.  She has recently began to have scratchy throat x 1 week then white patches noted 2 days ago.  Some hoarseness.  Please advise if she needs another course of diflucan?

## 2022-03-15 NOTE — Telephone Encounter (Signed)
I have notified the pt and cancelled the mano.

## 2022-03-15 NOTE — Telephone Encounter (Signed)
Diflucan has been sent to the pharmacy and pt aware.    Dr Ardis Hughs the pt wants to cancel upcoming esophageal mano, she says the swallowing was much better until the yeast returned.  She does not want to have the mano if at all possible.  Please advise

## 2022-03-18 ENCOUNTER — Telehealth: Payer: Self-pay

## 2022-03-18 ENCOUNTER — Encounter: Payer: Self-pay | Admitting: Internal Medicine

## 2022-03-18 NOTE — Telephone Encounter (Signed)
Patient requesting to change medication to something else.  She was diagnosed with esophageal yeast.  This is possible side effect of Symbicort  Patient can be reached at [phone].

## 2022-03-18 NOTE — Telephone Encounter (Signed)
Spoke with pt and informed her to contact Pulmonology.

## 2022-03-19 MED ORDER — CLOTRIMAZOLE 10 MG MT TROC: 10.0000 mg | Freq: Four times a day (QID) | OROMUCOSAL | 3 refills | Status: DC | PRN

## 2022-03-19 NOTE — Telephone Encounter (Signed)
Yes but already on very low dose symbicort (best choice here with no easy substitutes) so rec spacer and f/u w/in 4-6 weeks   In meantime prn::  clotrimazole troch 10 mg up to qid prn   #24   refill x 3

## 2022-03-27 ENCOUNTER — Ambulatory Visit (HOSPITAL_COMMUNITY): Admission: RE | Admit: 2022-03-27 | Payer: Medicare HMO | Source: Home / Self Care | Admitting: Gastroenterology

## 2022-03-27 ENCOUNTER — Encounter (HOSPITAL_COMMUNITY): Admission: RE | Payer: Self-pay | Source: Home / Self Care

## 2022-03-27 SURGERY — MANOMETRY, ESOPHAGUS
Anesthesia: Choice

## 2022-04-02 ENCOUNTER — Ambulatory Visit: Payer: Medicare HMO | Admitting: Sports Medicine

## 2022-04-18 ENCOUNTER — Ambulatory Visit: Payer: Medicare HMO | Admitting: Physician Assistant

## 2022-04-18 ENCOUNTER — Encounter: Payer: Self-pay | Admitting: Physician Assistant

## 2022-04-18 DIAGNOSIS — C44311 Basal cell carcinoma of skin of nose: Secondary | ICD-10-CM | POA: Diagnosis not present

## 2022-04-18 DIAGNOSIS — Z808 Family history of malignant neoplasm of other organs or systems: Secondary | ICD-10-CM | POA: Diagnosis not present

## 2022-04-18 DIAGNOSIS — D0371 Melanoma in situ of right lower limb, including hip: Secondary | ICD-10-CM | POA: Diagnosis not present

## 2022-04-18 DIAGNOSIS — D485 Neoplasm of uncertain behavior of skin: Secondary | ICD-10-CM

## 2022-04-18 DIAGNOSIS — D0471 Carcinoma in situ of skin of right lower limb, including hip: Secondary | ICD-10-CM

## 2022-04-18 NOTE — Patient Instructions (Signed)

## 2022-04-24 ENCOUNTER — Encounter: Payer: Self-pay | Admitting: Physician Assistant

## 2022-04-24 ENCOUNTER — Telehealth (INDEPENDENT_AMBULATORY_CARE_PROVIDER_SITE_OTHER): Payer: Medicare HMO | Admitting: Physician Assistant

## 2022-04-24 ENCOUNTER — Ambulatory Visit (INDEPENDENT_AMBULATORY_CARE_PROVIDER_SITE_OTHER): Payer: Medicare HMO | Admitting: Family Medicine

## 2022-04-24 ENCOUNTER — Encounter: Payer: Self-pay | Admitting: Family Medicine

## 2022-04-24 ENCOUNTER — Telehealth: Payer: Self-pay | Admitting: Physician Assistant

## 2022-04-24 VITALS — BP 134/75 | HR 62 | Temp 98.3°F | Ht 58.47 in | Wt 138.8 lb

## 2022-04-24 DIAGNOSIS — R058 Other specified cough: Secondary | ICD-10-CM

## 2022-04-24 DIAGNOSIS — M797 Fibromyalgia: Secondary | ICD-10-CM

## 2022-04-24 DIAGNOSIS — Z789 Other specified health status: Secondary | ICD-10-CM | POA: Insufficient documentation

## 2022-04-24 DIAGNOSIS — G479 Sleep disorder, unspecified: Secondary | ICD-10-CM | POA: Diagnosis not present

## 2022-04-24 DIAGNOSIS — Z79899 Other long term (current) drug therapy: Secondary | ICD-10-CM | POA: Diagnosis not present

## 2022-04-24 DIAGNOSIS — R911 Solitary pulmonary nodule: Secondary | ICD-10-CM

## 2022-04-24 DIAGNOSIS — I2510 Atherosclerotic heart disease of native coronary artery without angina pectoris: Secondary | ICD-10-CM | POA: Diagnosis not present

## 2022-04-24 DIAGNOSIS — M8580 Other specified disorders of bone density and structure, unspecified site: Secondary | ICD-10-CM

## 2022-04-24 DIAGNOSIS — Z1231 Encounter for screening mammogram for malignant neoplasm of breast: Secondary | ICD-10-CM | POA: Diagnosis not present

## 2022-04-24 DIAGNOSIS — K219 Gastro-esophageal reflux disease without esophagitis: Secondary | ICD-10-CM

## 2022-04-24 DIAGNOSIS — I700 Atherosclerosis of aorta: Secondary | ICD-10-CM

## 2022-04-24 DIAGNOSIS — J45909 Unspecified asthma, uncomplicated: Secondary | ICD-10-CM | POA: Diagnosis not present

## 2022-04-24 DIAGNOSIS — E782 Mixed hyperlipidemia: Secondary | ICD-10-CM

## 2022-04-24 DIAGNOSIS — E663 Overweight: Secondary | ICD-10-CM | POA: Diagnosis not present

## 2022-04-24 DIAGNOSIS — E039 Hypothyroidism, unspecified: Secondary | ICD-10-CM | POA: Diagnosis not present

## 2022-04-24 DIAGNOSIS — I2584 Coronary atherosclerosis due to calcified coronary lesion: Secondary | ICD-10-CM

## 2022-04-24 DIAGNOSIS — Z0000 Encounter for general adult medical examination without abnormal findings: Secondary | ICD-10-CM | POA: Diagnosis not present

## 2022-04-24 DIAGNOSIS — E041 Nontoxic single thyroid nodule: Secondary | ICD-10-CM

## 2022-04-24 HISTORY — DX: Solitary pulmonary nodule: R91.1

## 2022-04-24 LAB — CBC
HCT: 38.8 % (ref 36.0–46.0)
Hemoglobin: 13.1 g/dL (ref 12.0–15.0)
MCHC: 33.8 g/dL (ref 30.0–36.0)
MCV: 85.4 fl (ref 78.0–100.0)
Platelets: 236 10*3/uL (ref 150.0–400.0)
RBC: 4.54 Mil/uL (ref 3.87–5.11)
RDW: 14.1 % (ref 11.5–15.5)
WBC: 5.5 10*3/uL (ref 4.0–10.5)

## 2022-04-24 LAB — VITAMIN D 25 HYDROXY (VIT D DEFICIENCY, FRACTURES): VITD: 94.93 ng/mL (ref 30.00–100.00)

## 2022-04-24 LAB — COMPREHENSIVE METABOLIC PANEL
ALT: 17 U/L (ref 0–35)
AST: 22 U/L (ref 0–37)
Albumin: 4.4 g/dL (ref 3.5–5.2)
Alkaline Phosphatase: 87 U/L (ref 39–117)
BUN: 10 mg/dL (ref 6–23)
CO2: 28 mEq/L (ref 19–32)
Calcium: 9.7 mg/dL (ref 8.4–10.5)
Chloride: 100 mEq/L (ref 96–112)
Creatinine, Ser: 0.71 mg/dL (ref 0.40–1.20)
GFR: 85.84 mL/min (ref >60.00)
Glucose, Bld: 107 mg/dL — ABNORMAL HIGH (ref 70–99)
Potassium: 4.5 mEq/L (ref 3.5–5.1)
Sodium: 136 mEq/L (ref 135–145)
Total Bilirubin: 0.5 mg/dL (ref 0.2–1.2)
Total Protein: 6.6 g/dL (ref 6.0–8.3)

## 2022-04-24 LAB — LIPID PANEL
Cholesterol: 156 mg/dL (ref 0–200)
HDL: 56.4 mg/dL (ref >39.00)
LDL Cholesterol: 67 mg/dL (ref 0–99)
NonHDL: 99.84
Total CHOL/HDL Ratio: 3
Triglycerides: 163 mg/dL — ABNORMAL HIGH (ref 0.0–149.0)
VLDL: 32.6 mg/dL (ref 0.0–40.0)

## 2022-04-24 LAB — TSH: TSH: 1.61 u[IU]/mL (ref 0.35–5.50)

## 2022-04-24 LAB — HEMOGLOBIN A1C: Hgb A1c MFr Bld: 6.2 % (ref 4.6–6.5)

## 2022-04-24 MED ORDER — EZETIMIBE 10 MG PO TABS
10.0000 mg | ORAL_TABLET | Freq: Every day | ORAL | 3 refills | Status: DC
Start: 2022-04-24 — End: 2022-06-05

## 2022-04-24 MED ORDER — ZOLPIDEM TARTRATE 5 MG PO TABS: 5.0000 mg | ORAL_TABLET | Freq: Every day | ORAL | 1 refills | Status: DC

## 2022-04-24 MED ORDER — TIZANIDINE HCL 4 MG PO TABS
4.0000 mg | ORAL_TABLET | Freq: Four times a day (QID) | ORAL | 5 refills | Status: DC | PRN
Start: 2022-04-24 — End: 2022-10-01

## 2022-04-24 MED ORDER — FLUTICASONE PROPIONATE 50 MCG/ACT NA SUSP
2.0000 | Freq: Every day | NASAL | 11 refills | Status: DC
Start: 2022-04-24 — End: 2022-10-01

## 2022-04-24 MED ORDER — HYOSCYAMINE SULFATE 0.125 MG PO TABS: 0.1250 mg | ORAL_TABLET | Freq: Four times a day (QID) | ORAL | 5 refills | Status: DC | PRN

## 2022-04-24 MED ORDER — HYDROCHLOROTHIAZIDE 12.5 MG PO CAPS: 12.5000 mg | ORAL_CAPSULE | Freq: Every day | ORAL | 1 refills | Status: DC | PRN

## 2022-04-24 MED ORDER — ATENOLOL 25 MG PO TABS: 12.5000 mg | ORAL_TABLET | Freq: Every day | ORAL | 1 refills | Status: DC

## 2022-04-24 MED ORDER — MELOXICAM 15 MG PO TABS: 15.0000 mg | ORAL_TABLET | Freq: Every day | ORAL | 1 refills | Status: DC

## 2022-04-24 MED ORDER — MONTELUKAST SODIUM 10 MG PO TABS
ORAL_TABLET | ORAL | 1 refills | Status: DC
Start: 2022-04-24 — End: 2022-10-01

## 2022-04-24 MED ORDER — ALBUTEROL SULFATE HFA 108 (90 BASE) MCG/ACT IN AERS: 2.0000 | INHALATION_SPRAY | Freq: Four times a day (QID) | RESPIRATORY_TRACT | 5 refills | Status: DC | PRN

## 2022-04-24 NOTE — Progress Notes (Signed)
   New Patient   Subjective  Rita Levine is a 71 y.o. female who presents for the following: New Patient (Initial Visit) (Patient her today for skin check. Per patient she has a few lesions that she would like checked. Per patient there's a lesion on her left nasal crease x unsure that does itch, per patient some bleeding, the lesion is growing. No personal history of atypical moles, melanoma or non mole skin cancer. Family history of non mole skin cancer. ).   The following portions of the chart were reviewed this encounter and updated as appropriate:  Tobacco  Allergies  Meds  Problems  Med Hx  Surg Hx  Fam Hx      Objective  Well appearing patient in no apparent distress; mood and affect are within normal limits.  A full examination was performed including scalp, head, eyes, ears, nose, lips, neck, chest, axillae, abdomen, back, buttocks, bilateral upper extremities, bilateral lower extremities, hands, feet, fingers, toes, fingernails, and toenails. All findings within normal limits unless otherwise noted below.  Right Lower Leg - Posterior Bichromic dark nested macule.      Left Alar Crease Pearly papule with telangectasia.       Assessment & Plan  Neoplasm of uncertain behavior of skin (2) Right Lower Leg - Posterior  Skin / nail biopsy Type of biopsy: tangential   Informed consent: discussed and consent obtained   Timeout: patient name, date of birth, surgical site, and procedure verified   Anesthesia: the lesion was anesthetized in a standard fashion   Anesthetic:  1% lidocaine w/ epinephrine 1-100,000 local infiltration Instrument used: flexible razor blade   Hemostasis achieved with: aluminum chloride and electrodesiccation   Outcome: patient tolerated procedure well   Post-procedure details: sterile dressing applied and wound care instructions given   Dressing type: petrolatum and pressure dressing    Specimen 1 - Surgical pathology Differential  Diagnosis: R/O Atypia - cautery after biopsy  Check Margins: Yes  Left Alar Crease  Skin / nail biopsy Type of biopsy: tangential   Informed consent: discussed and consent obtained   Timeout: patient name, date of birth, surgical site, and procedure verified   Anesthesia: the lesion was anesthetized in a standard fashion   Anesthetic:  1% lidocaine w/ epinephrine 1-100,000 local infiltration Instrument used: flexible razor blade   Hemostasis achieved with: aluminum chloride and electrodesiccation   Outcome: patient tolerated procedure well   Post-procedure details: sterile dressing applied and wound care instructions given   Dressing type: petrolatum and bandage    Specimen 2 - Surgical pathology Differential Diagnosis: R/O BCC vs SCC - cautery  Check Margins: Yes     I, Amyri Frenz, PA-C, have reviewed all documentation's for this visit.  The documentation on 04/24/22 for the exam, diagnosis, procedures and orders are all accurate and complete.

## 2022-04-24 NOTE — Progress Notes (Signed)
Patient ID: Rita Levine, female  DOB: [birth year], 71 y.o.   MRN: [id number] Patient Care Team    Relationship Specialty Notifications Start End  Ma Hillock, DO PCP - General Family Medicine  12/01/19   Jettie Booze, MD PCP - Cardiology Cardiology  01/26/20   Milus Banister, MD Attending Physician Gastroenterology  12/01/19   Starlyn Skeans Physician Assistant Dermatology  04/18/22     Chief Complaint  Patient presents with   Annual Exam    Pt is fasting    Subjective: Rita Levine is a 71 y.o.  Female  present for CPE/cmc. All past medical history, surgical history, allergies, family history, immunizations, medications and social history were updated in the electronic medical record today. All recent labs, ED visits and hospitalizations within the last year were reviewed.  Health maintenance:  Colonoscopy: completed 07/2016, by jacobs, resutls 10 yr per pt.  Mammogram: completed:10/29/2021 [facility]. Ordered today Cervical cancer screening: > 65 n/a Immunizations: tdap UTD 2015, Influenza UTD  (encouraged yearly), PNA series completed, zostavax/shingrix declined , covid declined Infectious disease screening:  Hep C completed.  DEXA: last completed 12/2021, osteopenia> rpt 2-3 yr  Gastroesophageal reflux disease, unspecified whether esophagitis present Patient reports symptoms are well controlled on omeprazole and Pepcid.   Essential hypertension/HLD/Statin declined/Palpitations/Obesity (BMI 30-39.9)/statin intolerance Pt reports compliance with atenolol use of Maxide or losartan half tab if needed only.  Blood pressures ranges at home within normal limits. Patient denies chest pain, shortness of breath, dizziness or lower extremity edema.  Pt does not daily baby ASA. Pt is not prescribed statin due to intolerance.   Fibromyalgia/osteoarthritis, unspecified osteoarthritis type, unspecified site/ DDD (degenerative disc disease), lumbar/Spinal stenosis of  lumbar region, unspecified whether neurogenic claudication present Patient reports her fibromyalgia and arthritis symptoms are decently controlled on on Mobic and Zanaflex.  She does routinely see chiropractic services in the past and would like to be referred to a DO for OMT if possible.   Seasonal allergies/Allergic rhinitis due to pollen, unspecified seasonality/ Multiple food allergies She reports allergies are stable on Xyzal nightly, Singulair nightly and Allegra as needed in the day. Prior note: Patient reports her allergies have always been rather uncontrolled.  When she lived in a different state she had food allergy testing and was allergic to many foods.  She at one time had allergy shots and this is when her allergies were the best as far as control.  She reports frequent occurrence of sinus infections and sinus headaches.  She had an MRI in 2013.  Her current regimen consist of Xyzal, Allegra, Singulair and Flonase.  She has taking Zyrtec in the past.  She reports the Xyzal was added last year but she does not feel its been helpful.   irritable bowel syndrome She reports an extensive history of diverticulitis requiring colon resection and later exploratory lap of the abdomen with removal of adhesions for short bowel obstruction.  SHe has continued irritable bowel symptoms are well controlled on Levsin as needed.  She is established with gastroenterology.  Last colonoscopy 2017, with Dr. Ardis Hughs   Asthma, intrinsic Patient reports her asthma is well controlled on Symbicort.  She rarely needs to use her albuterol inhaler.   Hypothyroidism, unspecified type/Thyroid nodule She reports compliance with Armour 45 mg total dose.  Labs are due today.  [facility] due to cost. Prior note: Thyroid nodule history.  Last  ultrasound 08/17/2018 with left thyroid nodule.  Per radiology report 1 year follow-up was recommended.  Patient does endorse  mild compression-like symptoms.  She states her prior PCP had ordered follow-up, but it was canceled secondary to Covid pandemic.  Patient had thyroid ultrasound completed at [facility] radiology in [place].     04/24/2022    8:50 AM 11/06/2021   11:04 AM 06/12/2021    6:06 PM 11/28/2020    1:10 PM 07/18/2020    1:14 PM  Depression screen PHQ 2/9  Decreased Interest 0 0 0 0 0  Down, Depressed, Hopeless 0 0 0 0 0  PHQ - 2 Score 0 0 0 0 0  Altered sleeping 0 0     Tired, decreased energy 3 1     Change in appetite 1 0     Feeling bad or failure about yourself  0 0     Trouble concentrating 1 0     Moving slowly or fidgety/restless 0 0     Suicidal thoughts 0 0     PHQ-9 Score 5 1         04/24/2022    8:51 AM 11/06/2021   11:22 AM  GAD 7 : Generalized Anxiety Score  Nervous, Anxious, on Edge 0 0  Control/stop worrying 0 0  Worry too much - different things 0 0  Trouble relaxing 0 0  Restless 0 0  Easily annoyed or irritable 0 0  Afraid - awful might happen 0 0  Total GAD 7 Score 0 0     Immunization History  Administered Date(s) Administered   Influenza-Unspecified 06/11/2019, 07/14/2020   Pneumococcal Conjugate-13 07/03/2016   Pneumococcal Polysaccharide-23 08/03/2019   Tdap 05/02/2014   Zoster, Live 10/14/2010    Past Medical History:  Diagnosis Date   Allergic rhinitis    Allergic rhinitis due to pollen 12/03/2019   Allergy    Anemia    Asthma    Cataract    Chest pain, unspecified 04/07/2014   Chicken pox    Colon polyp    Diverticulosis    Dysphagia    Dysrhythmia    Palpitations   Fibromyalgia    Food allergy    Gallstone    GERD (gastroesophageal reflux disease)    Hiatal hernia    Hypercholesteremia    Hypertension    Hypothyroidism    IBS (irritable bowel syndrome)    Ingrown toenail 08/23/2020   Migraine headache    occasionally   Osteoarthritis    Osteopenia    Pneumonia    PONV (postoperative nausea and vomiting)    Pulmonary  hypertension ([place])    pt denies, pt states she was tested for it but was not diagnosed with it   Rectal bleeding    SBO (small bowel obstruction) ([place]) 10/20/2017   Seasonal allergies    Thyroid nodule    Trigger finger of left and right ring fingers 01/11/2021   Allergies  Allergen Reactions   Sulfamethoxazole-Trimethoprim Hives   Avelox [Moxifloxacin Hcl In Nacl]     Racing heart   Cephalexin Hives   Codeine Hives    Heart racing   Erythromycin Hives   Flagyl [Metronidazole]     Unknown reaction   Propranolol Hives   Sulfa Antibiotics Hives   Tetanus Toxoid Swelling    Reports a fever and headache with swelling.    Tramadol Hives   Statins Other (See Comments)    Myalgia    Past Surgical History:  Procedure Laterality  Date   APPENDECTOMY  1982   BREAST EXCISIONAL BIOPSY Right 1995   benign   BREAST LUMPECTOMY  1991   CARPAL TUNNEL RELEASE Right 2006   Right wrist   CESAREAN SECTION     x 2   CHOLECYSTECTOMY N/A 07/25/2020   Procedure: LAPAROSCOPIC CHOLECYSTECTOMY;  Surgeon: [name], MD;  Location: [facility];  Service: General;  Laterality: N/A;   [place] ([place] HX)  01/03/2020   IMAGE MRI brain:  04/2012   No focal IAC or inner ear lesion to explain hearing loss. Slightly greater than expected number of subcortical T2- hyperintensities bilaterally.  These are nonspecific.  They can be seen in the setting of chronic migraine headaches, demyelinating process, chronic microvascular ischemic, prior infection or inflammation, or vasculitis   IMAGE MRI lumbar:  05/01/2006   Mild central canal stenosis with facet arthropathy and mildly bulging disc at L4-5.  There is mild narrowing in the left lateral recess at this level.  No definite neural impingement.  Appearance at this level has not markedly Moderately severe facet arthropathy at L5-S1 with mild interval progression of a diffuse broad based disc bulge.  There is mild  biforaminal narrowing.  Central canal is open   LAPAROSCOPIC ABDOMINAL EXPLORATION  2019   adhesion removal> caused bowel blockage    NASAL SEPTUM SURGERY  2004   TONSILLECTOMY  1965   TOTAL ABDOMINAL HYSTERECTOMY W/ BILATERAL SALPINGOOPHORECTOMY  1988   TUBAL LIGATION  1974   UPPER GASTROINTESTINAL ENDOSCOPY  2020   Family History  Problem Relation Age of Onset   Hyperlipidemia Father    Hypertension Father    Arthritis Father    Diabetes Father    Asthma Father    COPD Father    Early death Father    Parkinson's disease Father    Hypertension Mother    Hyperlipidemia Mother    Macular degeneration Mother    Arthritis Mother    Hearing loss Mother    Heart disease Mother    Kidney disease Mother    Throat cancer Brother        lung, and tongue   Arthritis Brother    COPD Brother    Diabetes type II Sister    COPD Sister    Heart disease Sister    Hypertension Sister    Thyroid cancer Sister    Lung cancer Sister    Early death Sister    COPD Sister    Breast cancer Maternal Aunt    Lung cancer Other        uncle   Emphysema Maternal Aunt    Emphysema Maternal Uncle    Clotting disorder Sister    Brain cancer Sister        brain tumor- not malignant   Breast cancer Cousin    Cancer Sister    Alcohol abuse Sister    COPD Sister    Early death Sister    Alcohol abuse Brother    Arthritis Brother    Depression Brother    Diabetes Brother    Hypercholesterolemia Brother    Colon cancer Neg Hx    Esophageal cancer Neg Hx    Rectal cancer Neg Hx    Stomach cancer Neg Hx    Social History   Social History Narrative   Marital status/children/pets: married, 2 children   Education/employment: HS grad. retired   Engineer, materials:      -  smoke alarm in the home:Yes     - wears seatbelt: Yes     - Feels safe in their relationships: Yes    Allergies as of 04/24/2022       Reactions   Sulfamethoxazole-trimethoprim Hives   Avelox [moxifloxacin Hcl In Nacl]    Racing  heart   Cephalexin Hives   Codeine Hives   Heart racing   Erythromycin Hives   Flagyl [metronidazole]    Unknown reaction   Propranolol Hives   Sulfa Antibiotics Hives   Tetanus Toxoid Swelling   Reports a fever and headache with swelling.    Tramadol Hives   Statins Other (See Comments)   Myalgia        Medication List        Accurate as of April 24, 2022 12:25 PM. If you have any questions, ask your nurse or doctor.          albuterol (2.5 MG/3ML) 0.083% nebulizer solution Commonly known as: PROVENTIL Take 3 mLs (2.5 mg total) by nebulization every 6 (six) hours as needed.   albuterol 108 (90 Base) MCG/ACT inhaler Commonly known as: ProAir HFA Inhale 2 puffs into the lungs every 6 (six) hours as needed.   Armour Thyroid 15 MG tablet Generic drug: thyroid Take 1 tablet by mouth in the AM Once a day   Armour Thyroid 30 MG tablet Generic drug: thyroid Take 1 tablet by mouth in the AM once a day   atenolol 25 MG tablet Commonly known as: TENORMIN Take 0.5-1 tablets (12.5-25 mg total) by mouth daily.   budesonide 0.25 MG/2ML nebulizer solution Commonly known as: PULMICORT Take by nebulization 2 (two) times daily.   calcium carbonate 500 MG chewable tablet Commonly known as: TUMS - dosed in mg elemental calcium Chew 3 tablets by mouth daily as needed for indigestion or heartburn.   clotrimazole 10 MG troche Commonly known as: MYCELEX Take 1 tablet (10 mg total) by mouth 4 (four) times daily as needed.   CoQ-10 100 MG Caps Take 100 mg by mouth daily.   ezetimibe 10 MG tablet Commonly known as: ZETIA Take 1 tablet (10 mg total) by mouth daily.   famotidine 40 MG tablet Commonly known as: PEPCID Take 1 tablet (40 mg total) by mouth 2 (two) times daily.   fexofenadine 180 MG tablet Commonly known as: Allergy Relief Take 1 tablet (180 mg total) by mouth daily.   fluticasone 50 MCG/ACT nasal spray Commonly known as: FLONASE Place 2 sprays into both  nostrils daily.   folic acid 1 MG tablet Commonly known as: FOLVITE SMARTSIG:2 Tablet(s) By Mouth   hydrochlorothiazide 12.5 MG capsule Commonly known as: MICROZIDE Take 1 capsule (12.5 mg total) by mouth daily as needed.   hyoscyamine 0.125 MG tablet Commonly known as: LEVSIN Take 1 tablet (0.125 mg total) by mouth every 6 (six) hours as needed.   levocetirizine 5 MG tablet Commonly known as: XYZAL Take 1 tablet (5 mg total) by mouth every evening.   meloxicam 15 MG tablet Commonly known as: MOBIC Take 1 tablet (15 mg total) by mouth daily.   mometasone 0.1 % lotion Commonly known as: ELOCON Apply 1 application topically daily as needed (ear irritation).   montelukast 10 MG tablet Commonly known as: SINGULAIR TAKE 1 TABLET BY MOUTH EVERYDAY AT BEDTIME   mupirocin ointment 2 % Commonly known as: BACTROBAN Apply 1 application topically 2 (two) times daily.   ondansetron 8 MG disintegrating tablet Commonly known as: ZOFRAN-ODT Take  1 tablet (8 mg total) by mouth every 8 (eight) hours as needed for nausea or vomiting.   pantoprazole 40 MG tablet Commonly known as: PROTONIX Take 1 tablet (40 mg total) by mouth 2 (two) times daily.   Probiotic Caps Take 1 capsule by mouth daily.   QC TUMERIC COMPLEX PO Take by mouth.   rosuvastatin 10 MG tablet Commonly known as: CRESTOR TAKE 1 TABLET BY MOUTH 3 TIMES A WEEK.   Sodium Fluoride 5000 PPM 1.1 % Pste Generic drug: Sodium Fluoride Take by mouth daily.   Symbicort 80-4.5 MCG/ACT inhaler Generic drug: budesonide-formoterol SMARTSIG:2 Puff(s) Via Inhaler Morning-Night   THERATEARS OP Place 1 drop into both eyes 2 (two) times daily.   tiZANidine 4 MG tablet Commonly known as: ZANAFLEX Take 1 tablet (4 mg total) by mouth every 6 (six) hours as needed for muscle spasms.   valACYclovir 1000 MG tablet Commonly known as: VALTREX 2 tabs at onset, rpt 2 tabs in 12 hours once.   VITAMIN A PO Take 1 tablet by mouth  daily.   vitamin C 1000 MG tablet Take 3,000 mg by mouth daily.   Vitamin D 125 MCG (5000 UT) Caps Take 5,000 Units by mouth daily.   zinc gluconate 50 MG tablet Take 100 mg by mouth daily.   zolpidem 5 MG tablet Commonly known as: AMBIEN Take 1 tablet (5 mg total) by mouth at bedtime. As needed        All past medical history, surgical history, allergies, family history, immunizations andmedications were updated in the EMR today and reviewed under the history and medication portions of their EMR.      MM 3D SCREEN BREAST BILATERAL Result Date: 09/29/2020  BI-RADS CATEGORY  1: Negative. Electronically Signed   By: Lajean Manes M.D.   On: 09/29/2020 11:27   LONG TERM MONITOR (3-14 DAYS)  Result Date: 09/29/2020  Normal sinus rhythm.  Rare, briuef runs of PACs.  No sustained pathologic arrhythmias.      ROS: 14 pt review of systems performed and negative (unless mentioned in an HPI)  Objective: BP 134/75   Pulse 62   Temp 98.3 F (36.8 C)   Ht 4' 10.47" (1.485 m)   Wt 138 lb 12.8 oz (63 kg)   SpO2 99%   BMI 28.55 kg/m  Physical Exam Vitals and nursing note reviewed.  Constitutional:      General: She is not in acute distress.    Appearance: Normal appearance. She is not ill-appearing, toxic-appearing or diaphoretic.  HENT:     Head: Normocephalic and atraumatic.     Right Ear: Tympanic membrane, ear canal and external ear normal. There is no impacted cerumen.     Left Ear: Tympanic membrane, ear canal and external ear normal. There is no impacted cerumen.     Nose: No congestion or rhinorrhea.     Mouth/Throat:     Mouth: Mucous membranes are moist.     Pharynx: Oropharynx is clear. No oropharyngeal exudate or posterior oropharyngeal erythema.  Eyes:     General: No scleral icterus.       Right eye: No discharge.        Left eye: No discharge.     Extraocular Movements: Extraocular movements intact.     Conjunctiva/sclera: Conjunctivae normal.      Pupils: Pupils are equal, round, and reactive to light.  Cardiovascular:     Rate and Rhythm: Normal rate and regular rhythm.     Pulses: Normal  pulses.     Heart sounds: Normal heart sounds. No murmur heard.    No friction rub. No gallop.  Pulmonary:     Effort: Pulmonary effort is normal. No respiratory distress.     Breath sounds: Normal breath sounds. No stridor. No wheezing, rhonchi or rales.  Chest:     Chest wall: No tenderness.  Abdominal:     General: Abdomen is flat. Bowel sounds are normal. There is no distension.     Palpations: Abdomen is soft. There is no mass.     Tenderness: There is no abdominal tenderness. There is no right CVA tenderness, left CVA tenderness, guarding or rebound.     Hernia: No hernia is present.  Musculoskeletal:        General: No swelling, tenderness or deformity. Normal range of motion.     Cervical back: Normal range of motion and neck supple. No rigidity or tenderness.     Right lower leg: No edema.     Left lower leg: No edema.  Lymphadenopathy:     Cervical: No cervical adenopathy.  Skin:    General: Skin is warm and dry.     Coloration: Skin is not jaundiced or pale.     Findings: No bruising, erythema, lesion or rash.  Neurological:     General: No focal deficit present.     Mental Status: She is alert and oriented to person, place, and time. Mental status is at baseline.     Cranial Nerves: No cranial nerve deficit.     Sensory: No sensory deficit.     Motor: No weakness.     Coordination: Coordination normal.     Gait: Gait normal.     Deep Tendon Reflexes: Reflexes normal.  Psychiatric:        Mood and Affect: Mood normal.        Behavior: Behavior normal.        Thought Content: Thought content normal.        Judgment: Judgment normal.     No results found.  Assessment/plan: Rita Levine is a 71 y.o. female present for CPE/CMC Gastroesophageal reflux disease, unspecified whether esophagitis present Stable Continue  Protonix. Continue Pepcid.   Essential hypertension/HLD/Statin declined/Palpitations/Obesity (BMI 30-39.9)-long-term current use of medication Stable Continue Tenormin 12.5 mg qhs Continue Microzide 12.5 mg daily PRN if notable fluid or  losartan 1/2 tab. Only if pressures are elevated > 989 systolic.  Patient reports compliance with low dose statin twice a week supplied by Dr. Scarlette Calico.   Low-sodium diet.  Routine exercise. CBC, CMP, TSH, A1c and lipids collected today.   Osteoarthritis, unspecified osteoarthritis type, unspecified site/ DDD (degenerative disc disease), lumbar/Spinal stenosis of lumbar region, unspecified whether neurogenic claudication present/Fibromyalgia Stable Continue Mobic daily  Continue Zanaflex   Seasonal allergies/Allergic rhinitis due to pollen, unspecified seasonality/ Multiple food allergies Continue allergy med of choice.  Vistaril  Tired & not helpful.  Continue Singulair 10 mg nightly Continue flonase  Sleep disturbance: Stable Continue Ambien prn Tired vistaril- not helpful. Could consider trazodone in the future if willing.  nccs database reviewed today   irritable bowel syndrome Stable Continue probiotic Continue Levsin 0.125 mg every 6 hours as needed  Asthma, intrinsic Stable Continue Symbicort. Continue  albuterol as needed Continue antihistamine regimen   Hypothyroidism, unspecified type/Thyroid nodule Has been stable Continue Armour Thyroid total dose 45 mg daily (30/15).  This is the [facility]. TSH collected today.  Medication will be  refilled after results received and appropriate dose. - US THYROID-improvement in size of thyroid nodule and no further studies needed in the future.  Osteopenia, unspecified location -Vitamin D collected today -UTD 12/2020-DEXA  Breast cancer screening by mammogram - MM 3D SCREEN BREAST BILATERAL; Future   Pulmonary nodule- 66m pleural based  nodule (01/2021) Airway cough syndrome.  Pulmonary nodule noted on CT chest April 2022.  Will order repeat CT chest today for 1-year follow-up.  If stable and appears benign, no further follow-up would be needed. - CT Chest W Contrast; Future   Routine general medical examination at a health care facility Colonoscopy: completed 07/2016, by jacobs, resutls 10 yr per pt.  Mammogram: completed:10/29/2021 [facility]. Ordered today Cervical cancer screening: > 65 n/a Immunizations: tdap UTD 2015, Influenza UTD  (encouraged yearly), PNA series completed, zostavax/shingrix declined , covid declined Infectious disease screening:  Hep C completed.  DEXA: last completed 12/2021, osteopenia> rpt 2-3 yr Patient was encouraged to exercise greater than 150 minutes a week. Patient was encouraged to choose a diet filled with fresh fruits and vegetables, and lean meats. AVS provided to patient today for education/recommendation on gender specific health and safety maintenance.  Return in about 24 weeks (around 10/09/2022) for Routine chronic condition follow-up.    Orders Placed This Encounter  Procedures   MM 3D SCREEN BREAST BILATERAL   CT Chest W Contrast   CBC   Comprehensive metabolic panel   Hemoglobin A1c   Lipid panel   TSH   Vitamin D (25 hydroxy)    Meds ordered this encounter  Medications   albuterol (PROAIR HFA) 108 (90 Base) MCG/ACT inhaler    Sig: Inhale 2 puffs into the lungs every 6 (six) hours as needed.    Dispense:  6.7 g    Refill:  5   atenolol (TENORMIN) 25 MG tablet    Sig: Take 0.5-1 tablets (12.5-25 mg total) by mouth daily.    Dispense:  90 tablet    Refill:  1   ezetimibe (ZETIA) 10 MG tablet    Sig: Take 1 tablet (10 mg total) by mouth daily.    Dispense:  90 tablet    Refill:  3   fluticasone (FLONASE) 50 MCG/ACT nasal spray    Sig: Place 2 sprays into both nostrils daily.    Dispense:  16 mL    Refill:  11   hydrochlorothiazide (MICROZIDE) 12.5 MG capsule     Sig: Take 1 capsule (12.5 mg total) by mouth daily as needed.    Dispense:  90 capsule    Refill:  1   hyoscyamine (LEVSIN) 0.125 MG tablet    Sig: Take 1 tablet (0.125 mg total) by mouth every 6 (six) hours as needed.    Dispense:  120 tablet    Refill:  5   meloxicam (MOBIC) 15 MG tablet    Sig: Take 1 tablet (15 mg total) by mouth daily.    Dispense:  90 tablet    Refill:  1   montelukast (SINGULAIR) 10 MG tablet    Sig: TAKE 1 TABLET BY MOUTH EVERYDAY AT BEDTIME    Dispense:  90 tablet    Refill:  1   tiZANidine (ZANAFLEX) 4 MG tablet    Sig: Take 1 tablet (4 mg total) by mouth every 6 (six) hours as needed for muscle spasms.    Dispense:  120 tablet    Refill:  5   zolpidem (AMBIEN) 5 MG tablet    Sig:  Take 1 tablet (5 mg total) by mouth at bedtime. As needed    Dispense:  90 tablet    Refill:  1    Referral Orders  No referral(s) requested today      Electronically signed by: Howard Pouch, [place]

## 2022-04-24 NOTE — Telephone Encounter (Signed)
Left message to review her pathology results.

## 2022-04-24 NOTE — Telephone Encounter (Signed)
Left message to go over her pathology results.

## 2022-04-24 NOTE — Patient Instructions (Signed)
No follow-ups on file.        Great to see you today.  I have refilled the medication(s) we provide.   If labs were collected, we will inform you of lab results once received either by echart message or telephone call.   - echart message- for normal results that have been seen by the patient already.   - telephone call: abnormal results or if patient has not viewed results in their echart.  Health Maintenance, Female Adopting a healthy lifestyle and getting preventive care are important in promoting health and wellness. Ask your health care provider about: The right schedule for you to have regular tests and exams. Things you can do on your own to prevent diseases and keep yourself healthy. What should I know about diet, weight, and exercise? Eat a healthy diet  Eat a diet that includes plenty of vegetables, fruits, low-fat dairy products, and lean protein. Do not eat a lot of foods that are high in solid fats, added sugars, or sodium. Maintain a healthy weight Body mass index (BMI) is used to identify weight problems. It estimates body fat based on height and weight. Your health care provider can help determine your BMI and help you achieve or maintain a healthy weight. Get regular exercise Get regular exercise. This is one of the most important things you can do for your health. Most adults should: Exercise for at least 150 minutes each week. The exercise should increase your heart rate and make you sweat (moderate-intensity exercise). Do strengthening exercises at least twice a week. This is in addition to the moderate-intensity exercise. Spend less time sitting. Even light physical activity can be beneficial. Watch cholesterol and blood lipids Have your blood tested for lipids and cholesterol at 71 years of age, then have this test every 5 years. Have your cholesterol levels checked more often if: Your lipid or cholesterol levels are high. You are older than 71 years of  age. You are at high risk for heart disease. What should I know about cancer screening? Depending on your health history and family history, you may need to have cancer screening at various ages. This may include screening for: Breast cancer. Cervical cancer. Colorectal cancer. Skin cancer. Lung cancer. What should I know about heart disease, diabetes, and high blood pressure? Blood pressure and heart disease High blood pressure causes heart disease and increases the risk of stroke. This is more likely to develop in people who have high blood pressure readings or are overweight. Have your blood pressure checked: Every 3-5 years if you are 18-39 years of age. Every year if you are 40 years old or older. Diabetes Have regular diabetes screenings. This checks your fasting blood sugar level. Have the screening done: Once every three years after age 40 if you are at a normal weight and have a low risk for diabetes. More often and at a younger age if you are overweight or have a high risk for diabetes. What should I know about preventing infection? Hepatitis B If you have a higher risk for hepatitis B, you should be screened for this virus. Talk with your health care provider to find out if you are at risk for hepatitis B infection. Hepatitis C Testing is recommended for: Everyone born from 1945 through 1965. Anyone with known risk factors for hepatitis C. Sexually transmitted infections (STIs) Get screened for STIs, including gonorrhea and chlamydia, if: You are sexually active and are younger than 71 years of age. You are   older than 71 years of age and your health care provider tells you that you are at risk for this type of infection. Your sexual activity has changed since you were last screened, and you are at increased risk for chlamydia or gonorrhea. Ask your health care provider if you are at risk. Ask your health care provider about whether you are at high risk for HIV. Your health  care provider may recommend a prescription medicine to help prevent HIV infection. If you choose to take medicine to prevent HIV, you should first get tested for HIV. You should then be tested every 3 months for as long as you are taking the medicine. Pregnancy If you are about to stop having your period (premenopausal) and you may become pregnant, seek counseling before you get pregnant. Take 400 to 800 micrograms (mcg) of folic acid every day if you become pregnant. Ask for birth control (contraception) if you want to prevent pregnancy. Osteoporosis and menopause Osteoporosis is a disease in which the bones lose minerals and strength with aging. This can result in bone fractures. If you are 65 years old or older, or if you are at risk for osteoporosis and fractures, ask your health care provider if you should: Be screened for bone loss. Take a calcium or vitamin D supplement to lower your risk of fractures. Be given hormone replacement therapy (HRT) to treat symptoms of menopause. Follow these instructions at home: Alcohol use Do not drink alcohol if: Your health care provider tells you not to drink. You are pregnant, may be pregnant, or are planning to become pregnant. If you drink alcohol: Limit how much you have to: 0-1 drink a day. Know how much alcohol is in your drink. In the [place] one drink equals one 12 oz bottle of beer (355 mL), one 5 oz glass of wine (148 mL), or one 1 oz glass of hard liquor (44 mL). Lifestyle Do not use any products that contain nicotine or tobacco. These products include cigarettes, chewing tobacco, and vaping devices, such as e-cigarettes. If you need help quitting, ask your health care provider. Do not use street drugs. Do not share needles. Ask your health care provider for help if you need support or information about quitting drugs. General instructions Schedule regular health, dental, and eye exams. Stay current with your vaccines. Tell your health  care provider if: You often feel depressed. You have ever been abused or do not feel safe at home. Summary Adopting a healthy lifestyle and getting preventive care are important in promoting health and wellness. Follow your health care provider's instructions about healthy diet, exercising, and getting tested or screened for diseases. Follow your health care provider's instructions on monitoring your cholesterol and blood pressure. This information is not intended to replace advice given to you by your health care provider. Make sure you discuss any questions you have with your health care provider. Document Revised: 02/19/2021 Document Reviewed: 02/19/2021 Elsevier Patient Education  2023 [employer]  

## 2022-04-25 ENCOUNTER — Telehealth: Payer: Self-pay | Admitting: Family Medicine

## 2022-04-25 ENCOUNTER — Telehealth: Payer: Self-pay | Admitting: Physician Assistant

## 2022-04-25 DIAGNOSIS — E039 Hypothyroidism, unspecified: Secondary | ICD-10-CM

## 2022-04-25 MED ORDER — ARMOUR THYROID 15 MG PO TABS: ORAL_TABLET | ORAL | 3 refills | Status: DC

## 2022-04-25 MED ORDER — ARMOUR THYROID 30 MG PO TABS: ORAL_TABLET | ORAL | 3 refills | Status: DC

## 2022-04-25 NOTE — Telephone Encounter (Signed)
Please call patient Liver, kidney and thyroid function are normal> I have called in her medication to be pharmacy Blood cell counts and electrolytes are normal Diabetes screening/A1c has increased and her A1c is now 6.2 which is in the prediabetic range.  Medications needed at this time, however I would encourage her to try to increase her exercise habits per week.  Avoid high sugar content snacks and cut back on complex carbohydrates in her diet (bread, pasta, rice, anything with flour etc.) Cholesterol panel looks good and is at goal.  Her LDL is excellent at 67. Vitamin D is too high at 94.  Vitamin D can become toxic at these levels.  I would encourage her to stop all vitamin D supplements for 3 months to allow her levels to drop down into normal range.  She can then start vitamin D3 2000 units daily.   Lastly, I did place the order for her repeat CT chest.  Her CT chest was completed last April 2022 and recommended a yearly follow-up to ensure the nodule remained stable.  I placed that order and they should be calling her to get that scheduled.  If the nodules completely stable no more further evaluation would be needed.

## 2022-04-25 NOTE — Telephone Encounter (Signed)
Discussed pathology report with the patient. She was inclined to use Dr. Manley Mason for her procedures. She was instructed to call if she has any questions. We will start the referral process.

## 2022-04-26 NOTE — Telephone Encounter (Signed)
Spoke with pt regarding labs and instructions.   

## 2022-04-30 ENCOUNTER — Telehealth: Payer: Self-pay

## 2022-04-30 NOTE — Telephone Encounter (Signed)
-----   Message from Warren Danes, [place] sent at 04/25/2022 10:00 AM EDT ----- Start referral process with Dr. Manley Mason for the [facility] and [facility]. Thank you!

## 2022-04-30 NOTE — Telephone Encounter (Signed)
Referral sent to Dr. [place] office for the patient to be seen.

## 2022-05-01 ENCOUNTER — Encounter: Payer: Self-pay | Admitting: Physician Assistant

## 2022-05-01 ENCOUNTER — Telehealth: Payer: Self-pay

## 2022-05-01 NOTE — Telephone Encounter (Signed)
Phone call from patient that her niece is an Therapist, sports and she looked at her biopsy site on her lower leg and told the patient that the biopsy site doesn't look well. I informed patient that the biopsy site is on the lower legs and it's a slow healing area. I also informed patient that she can take a picture and send it to [place] so we can see if she needs to come in to be seen. Patient states that she will take a picture and send it through my chart.

## 2022-05-02 MED ORDER — MUPIROCIN 2 % EX OINT: 1.0000 | TOPICAL_OINTMENT | Freq: Two times a day (BID) | CUTANEOUS | 2 refills | Status: DC

## 2022-05-08 ENCOUNTER — Encounter: Payer: Self-pay | Admitting: Nurse Practitioner

## 2022-05-08 ENCOUNTER — Ambulatory Visit: Payer: Medicare HMO | Admitting: Nurse Practitioner

## 2022-05-08 ENCOUNTER — Ambulatory Visit (INDEPENDENT_AMBULATORY_CARE_PROVIDER_SITE_OTHER): Payer: Medicare HMO

## 2022-05-08 VITALS — BP 146/70 | HR 71 | Temp 98.4°F | Ht 59.0 in | Wt 140.6 lb

## 2022-05-08 DIAGNOSIS — J45909 Unspecified asthma, uncomplicated: Secondary | ICD-10-CM

## 2022-05-08 DIAGNOSIS — R053 Chronic cough: Secondary | ICD-10-CM

## 2022-05-08 DIAGNOSIS — J329 Chronic sinusitis, unspecified: Secondary | ICD-10-CM | POA: Diagnosis not present

## 2022-05-08 DIAGNOSIS — J209 Acute bronchitis, unspecified: Secondary | ICD-10-CM | POA: Diagnosis not present

## 2022-05-08 LAB — POCT EXHALED NITRIC OXIDE: FeNO level (ppb): 14

## 2022-05-08 MED ORDER — DOXYCYCLINE HYCLATE 100 MG PO TABS: 100.0000 mg | ORAL_TABLET | Freq: Two times a day (BID) | ORAL | 0 refills | Status: AC

## 2022-05-08 MED ORDER — BENZONATATE 200 MG PO CAPS: 200.0000 mg | ORAL_CAPSULE | Freq: Three times a day (TID) | ORAL | 1 refills | Status: DC | PRN

## 2022-05-08 MED ORDER — PREDNISONE 20 MG PO TABS: 40.0000 mg | ORAL_TABLET | Freq: Every day | ORAL | 0 refills | Status: AC

## 2022-05-08 NOTE — Assessment & Plan Note (Addendum)
Unclear underlying etiology of chronic cough. Previous imaging unrevealing on specific cause. She never completed PFTs previously. Slight worsening; likely acute bronchitis given CXR findings today. No evidence of superimposed infection. We will treat her with empiric doxycycline 1 tab bid for 7 days and prednisone burst 40 mg daily for 5 days. Recommended she complete PFTs once improved and undergo CT chest ordered by PCP.

## 2022-05-08 NOTE — Patient Instructions (Addendum)
Continue Symbicort 2 puffs Twice daily. Brush tongue and rinse mouth afterwards. Use with spacer -Continue albuterol inhaler 2 puffs or 3 mL nebulizer every 6 hours as needed for shortness of breath or cough -Continue sinuglair 10 mg At bedtime -Continue protonix 40 mg Twice daily -Continue pepcid 10 mg daily  -Continue Xyzal 5 mg At bedtime  -Continue nasal irrigation treatments per ENT -Continue flonase 2 sprays each nostril Twice daily for nasal congestion/drainage   Delsym 2 tsp Twice daily for cough  Chlortab 4 mg over the counter At bedtime for cough Tessalon perles 1 capsule Three times a day for cough Prednisone 40 mg daily for 5 days. Take in am with food. Doxycycline 1 tab Twice daily for 7 days. Take with food. Wear sunscreen while taking.    Follow up with ENT as scheduled.    Pulmonary function testing scheduled today.   Follow up after PFTs with Dr. Melvyn Novas or Alanson Aly. If symptoms do not improve or worsen, please contact office for sooner follow up or seek emergency care.

## 2022-05-08 NOTE — Assessment & Plan Note (Addendum)
Advised she return to ENT for evaluation of her sinus symptoms and hoarseness. See above plan.

## 2022-05-08 NOTE — Progress Notes (Signed)
$'@Patient'F$  ID: Rita Levine, female    DOB: December 26, 1950, 71 y.o.   MRN: [id number]  Chief Complaint  Patient presents with   Follow-up    Patient states she's still concerned about the cough and hoarseness. Hoarseness has gotten worse.     Referring provider: Ma Hillock, DO  HPI: 71 year old female, never smoker followed for hx of asthma, chronic cough.  She is a patient of Dr. Gustavus Bryant and was last seen in office on 10/25/2021 by [facility] NP.  Past medical history significant for hypertension, aortic atherosclerosis, CAD, pulmonary hypertension, GERD, IBS, hypothyroidism, HLD, fibromyalgia, allergic rhinitis  TEST/EVENTS:  01/15/2021 CT chest with contrast: moderate aortic atherosclerosis. Occasional coronary artery calcifications. Tiny hiatal hernia. Linear atelectasis/scar in the lingula, similar to prior exam. 3 mm pleural based nodule in the right upper lobe, likely benign. 04/28/2021 CXR 1 view: Lung volumes normal.  Pulmonary vasculature and the cardiomediastinal silhouette are within normal limits. 10/25/2021: Allergen panel negative, eos 100  08/02/2021: OV with Dr. Melvyn Novas.  Initial consult for chronic cough since having pneumonia in 2019.  No report of dyspnea.  Was previously placed on Symbicort 80 and was also using [place] twice a day.  Cough worse with Symbicort -suspected upper airway cough syndrome.  Recommended stopping Symbicort on trial basis.  Aggressive treatment of GERD with Protonix 40 mg twice a day and Pepcid 20 mg in the evening.  Albuterol and budesonide nebs up to twice a day if needed.  6-day prednisone taper.  09/12/2021: OV with Tylin Stradley NP for 6-week follow-up.  Did not notice major change in her symptoms.  She did experience some shortness of breath and chest tightness after burning some trash in her yard and working in the attic earlier in the week.  Felt some improvement but had not returned to baseline.  Persistent cough with primarily clear sputum production.  Allergy  symptoms uncontrolled and currently being treated by ENT.  Felt GERD symptoms improved with twice daily PPI.  Increased ICS budesonide nebs to 0.5 mg.  Prednisone taper.  10/25/2021: OV with Joseph Johns NP for 6 week follow up. She continues to experience persistent cough with clear sputum production. She occasionally experiences minimal shortness of breath upon exertion and with coughing spells. She continues to have post nasal drip and difficulties with her chronic sinusitis which she is scheduled to follow up with ENT this week to discuss. She also notes occasional breakthrough reflux, despite Twice daily PPI and pepcid nightly. She elevates the head of her bed and avoids triggering foods. She did notice some improvement in her symptoms with the prednisone taper and reports that her albuterol rescue inhaler does relieve her shortness of breath. FeNO nl. Persistent productive cough and minimal SOB with exertion. Suspect uncontrolled chronic sinusitis and GERD contributing. Hx of asthma - spirometry 2012 normal; no reports of wheezing. Slight improvement with prednisone. Trial triple therapy inhaler with Breztri 2 puffs Twice daily. Stop nebs. PRN albuterol. Instructed to follow up with ENT as scheduled. Advised to follow up with GI to discuss breakthrough GERD as she is already on Twice daily PPI and H2 once daily - may need EGD for further eval. Continue Singulair, Xyzal, saline nasal rinses. PFTs ordered today. Allergen panel and CBC with diff.   05/08/2022: Today - follow up Patient presents today for overdue follow-up.  She is still having a persistent chronic cough, which is possibly slightly worse than it was previously.  She has not noticed a huge change..  Usually  it is nonproductive but occasionally she will produce clear to yellow sputum, which is usually in the morning.  Breathing is unchanged ; will get short of breath with strenuous activity or climbing stairs.  She also has persistent hoarseness, which  she feels like is worse than it has been in the past.  She has chronic sinusitis and uses multiple nasal rinses.  She was seen by ENT after her last visit but only discussed a spot on her tongue and did not discuss her hoarseness or her persistent chronic sinusitis.  Feels like her GERD symptoms are better controlled.  She denies any wheezing, increased shortness of breath, orthopnea, PND, hemoptysis, weight loss, anorexia, fatigue, dysphagia.  She was previously trialed on step up to [place].  Did not notice a huge difference when back to low-dose Symbicort.  Uses this with a spacer.  Never completed her PFTs because she did not want to get COVID tested again.  Her PCP has ordered a CT chest for evaluation of the previously identified small nodule.  Allergies  Allergen Reactions   Sulfamethoxazole-Trimethoprim Hives   Avelox [Moxifloxacin Hcl In Nacl]     Racing heart   Cephalexin Hives   Codeine Hives    Heart racing   Erythromycin Hives   Flagyl [Metronidazole]     Unknown reaction   Propranolol Hives   Sulfa Antibiotics Hives   Tetanus Toxoid Swelling    Reports a fever and headache with swelling.    Tramadol Hives   Statins Other (See Comments)    Myalgia     Immunization History  Administered Date(s) Administered   Influenza-Unspecified 06/11/2019, 07/14/2020   Pneumococcal Conjugate-13 07/03/2016   Pneumococcal Polysaccharide-23 08/03/2019   Tdap 05/02/2014   Zoster, Live 10/14/2010    Past Medical History:  Diagnosis Date   Allergic rhinitis    Allergic rhinitis due to pollen 12/03/2019   Allergy    Anemia    Asthma    Cataract    Chest pain, unspecified 04/07/2014   Chicken pox    Colon polyp    Diverticulosis    Dysphagia    Dysrhythmia    Palpitations   Fibromyalgia    Food allergy    Gallstone    GERD (gastroesophageal reflux disease)    Hiatal hernia    Hypercholesteremia    Hypertension    Hypothyroidism    IBS (irritable bowel syndrome)     Ingrown toenail 08/23/2020   Migraine headache    occasionally   Osteoarthritis    Osteopenia    Pneumonia    PONV (postoperative nausea and vomiting)    Pulmonary hypertension ([place])    pt denies, pt states she was tested for it but was not diagnosed with it   Rectal bleeding    SBO (small bowel obstruction) ([place]) 10/20/2017   Seasonal allergies    Thyroid nodule    Trigger finger of left and right ring fingers 01/11/2021    Tobacco History: Social History   Tobacco Use  Smoking Status Never  Smokeless Tobacco Never   Counseling given: Not Answered   Outpatient Medications Prior to Visit  Medication Sig Dispense Refill   albuterol (PROAIR HFA) 108 (90 Base) MCG/ACT inhaler Inhale 2 puffs into the lungs every 6 (six) hours as needed. 6.7 g 5   albuterol (PROVENTIL) (2.5 MG/3ML) 0.083% nebulizer solution Take 3 mLs (2.5 mg total) by nebulization every 6 (six) hours as needed. 75 mL 0   ARMOUR THYROID 15 MG tablet Take  1 tablet by mouth in the AM Once a day 90 tablet 3   ARMOUR THYROID 30 MG tablet Take 1 tablet by mouth in the AM once a day 90 tablet 3   Ascorbic Acid (VITAMIN C) 1000 MG tablet Take 3,000 mg by mouth daily.     atenolol (TENORMIN) 25 MG tablet Take 0.5-1 tablets (12.5-25 mg total) by mouth daily. 90 tablet 1   calcium carbonate (TUMS - DOSED IN MG ELEMENTAL CALCIUM) 500 MG chewable tablet Chew 3 tablets by mouth daily as needed for indigestion or heartburn.     Carboxymethylcellulose Sodium (THERATEARS OP) Place 1 drop into both eyes 2 (two) times daily.     clotrimazole (MYCELEX) 10 MG troche Take 1 tablet (10 mg total) by mouth 4 (four) times daily as needed. 24 tablet 3   Coenzyme Q10 (COQ-10) 100 MG CAPS Take 100 mg by mouth daily.     ezetimibe (ZETIA) 10 MG tablet Take 1 tablet (10 mg total) by mouth daily. 90 tablet 3   famotidine (PEPCID) 40 MG tablet Take 1 tablet (40 mg total) by mouth 2 (two) times daily. 60 tablet 5   fexofenadine (ALLERGY RELIEF)  180 MG tablet Take 1 tablet (180 mg total) by mouth daily. 90 tablet 1   fluticasone (FLONASE) 50 MCG/ACT nasal spray Place 2 sprays into both nostrils daily. 16 mL 11   folic acid (FOLVITE) 1 MG tablet SMARTSIG:2 Tablet(s) By Mouth     hydrochlorothiazide (MICROZIDE) 12.5 MG capsule Take 1 capsule (12.5 mg total) by mouth daily as needed. 90 capsule 1   hyoscyamine (LEVSIN) 0.125 MG tablet Take 1 tablet (0.125 mg total) by mouth every 6 (six) hours as needed. 120 tablet 5   levocetirizine (XYZAL) 5 MG tablet Take 1 tablet (5 mg total) by mouth every evening. 90 tablet 3   mometasone (ELOCON) 0.1 % lotion Apply 1 application topically daily as needed (ear irritation).      montelukast (SINGULAIR) 10 MG tablet TAKE 1 TABLET BY MOUTH EVERYDAY AT BEDTIME 90 tablet 1   mupirocin ointment (BACTROBAN) 2 % Apply 1 Application topically 2 (two) times daily. 30 g 2   ondansetron (ZOFRAN-ODT) 8 MG disintegrating tablet Take 1 tablet (8 mg total) by mouth every 8 (eight) hours as needed for nausea or vomiting. 30 tablet 1   pantoprazole (PROTONIX) 40 MG tablet Take 1 tablet (40 mg total) by mouth 2 (two) times daily. 180 tablet 3   Probiotic CAPS Take 1 capsule by mouth daily.     rosuvastatin (CRESTOR) 10 MG tablet TAKE 1 TABLET BY MOUTH 3 TIMES A WEEK. 36 tablet 0   SODIUM FLUORIDE 5000 PPM 1.1 % PSTE Take by mouth daily.     SYMBICORT 80-4.5 MCG/ACT inhaler SMARTSIG:2 Puff(s) Via Inhaler Morning-Night     tiZANidine (ZANAFLEX) 4 MG tablet Take 1 tablet (4 mg total) by mouth every 6 (six) hours as needed for muscle spasms. 120 tablet 5   Turmeric (QC TUMERIC COMPLEX PO) Take by mouth.     valACYclovir (VALTREX) 1000 MG tablet 2 tabs at onset, rpt 2 tabs in 12 hours once. 20 tablet 1   VITAMIN A PO Take 1 tablet by mouth daily.     zinc gluconate 50 MG tablet Take 100 mg by mouth daily.     zolpidem (AMBIEN) 5 MG tablet Take 1 tablet (5 mg total) by mouth at bedtime. As needed 90 tablet 1   budesonide  (PULMICORT) 0.25 MG/2ML nebulizer solution  Take by nebulization 2 (two) times daily.     Cholecalciferol (VITAMIN D) 125 MCG (5000 UT) CAPS Take 5,000 Units by mouth daily.  (Patient not taking: Reported on 05/08/2022)     meloxicam (MOBIC) 15 MG tablet Take 1 tablet (15 mg total) by mouth daily. (Patient not taking: Reported on 05/08/2022) 90 tablet 1   No facility-administered medications prior to visit.     Review of Systems:   Constitutional: No weight loss or gain, night sweats, fevers, chills, fatigue, or lassitude. HEENT: No headaches, difficulty swallowing, tooth/dental problems, or sore throat. No sneezing, itching, ear ache. +nasal congestion, post nasal drip (chronic), hoarseness CV:  No chest pain, orthopnea, PND, swelling in lower extremities, anasarca, dizziness, palpitations, syncope Resp: +shortness of breath with exertion (baseline); persistent occasionally productive cough with small amount of clear sputum. No excess mucus or change in color of mucus. No hemoptysis. No wheezing.  No chest wall deformity GI:  No heartburn, indigestion, abdominal pain, nausea, vomiting, diarrhea, change in bowel habits, loss of appetite, bloody stools.  GU: No dysuria, change in color of urine, urgency or frequency.  No flank pain, no hematuria  Skin: No rash, lesions, ulcerations MSK:  No joint pain or swelling.  No decreased range of motion.  No back pain. Neuro: No dizziness or lightheadedness.  Psych: No depression or anxiety. Mood stable.     Physical Exam:  BP (!) 146/70 (BP Location: Right Arm, Patient Position: Sitting, Cuff Size: Normal)   Pulse 71   Temp 98.4 F (36.9 C) (Oral)   Ht '4\' 11"'$  (1.499 m)   Wt 140 lb 9.6 oz (63.8 kg)   SpO2 97%   BMI 28.40 kg/m   GEN: Pleasant, interactive, well-nourished; in no acute distress. HEENT:  Normocephalic and atraumatic. EACs patent bilaterally. TM pearly gray with present light reflex bilaterally. PERRLA. Sclera white. Nasal  turbinates erythematous, moist and patent bilaterally.  Right nasal polyp.  No rhinorrhea present. Oropharynx erythematous and moist, without exudate or edema. No lesions, ulcerations NECK:  Supple w/ fair ROM. No JVD present. Normal carotid impulses w/o bruits. Thyroid symmetrical with no goiter or nodules palpated. No lymphadenopathy.   CV: RRR, no m/r/g, no peripheral edema. Pulses intact, +2 bilaterally. No cyanosis, pallor or clubbing. PULMONARY:  Unlabored, regular breathing.  Minimal scattered rhonchi bilaterally A&P. No accessory muscle use. No dullness to percussion. GI: BS present and normoactive. Soft, non-tender to palpation. No organomegaly or masses detected. No CVA tenderness. MSK: No erythema, warmth or tenderness. Cap refil <2 sec all extrem. No deformities or joint swelling noted.  Neuro: A/Ox3. No focal deficits noted.   Skin: Warm, no lesions or rashe Psych: Normal affect and behavior. Judgement and thought content appropriate.     Lab Results:  CBC    Component Value Date/Time   WBC 5.5 04/24/2022 0840   RBC 4.54 04/24/2022 0840   HGB 13.1 04/24/2022 0840   HCT 38.8 04/24/2022 0840   PLT 236.0 04/24/2022 0840   MCV 85.4 04/24/2022 0840   MCH 28.3 04/28/2021 0934   MCHC 33.8 04/24/2022 0840   RDW 14.1 04/24/2022 0840   LYMPHSABS 1.4 10/25/2021 0950   MONOABS 0.7 10/25/2021 0950   EOSABS 0.1 10/25/2021 0950   BASOSABS 0.1 10/25/2021 0950    BMET    Component Value Date/Time   NA 136 04/24/2022 0840   NA 138 01/04/2021 1001   K 4.5 04/24/2022 0840   CL 100 04/24/2022 0840   CO2 28 04/24/2022 0840  GLUCOSE 107 (H) 04/24/2022 0840   BUN 10 04/24/2022 0840   BUN 12 01/04/2021 1001   CREATININE 0.71 04/24/2022 0840   CREATININE 0.73 11/28/2020 1342   CALCIUM 9.7 04/24/2022 0840   GFRNONAA >60 04/28/2021 0934    BNP No results found for: "BNP"   Imaging:  DG Chest 2 View  Result Date: 05/08/2022 CLINICAL DATA:  Chronic cough. EXAM: CHEST - 2 VIEW  COMPARISON:  Chest x-ray 08/13/2018 and chest CT 01/15/2021 FINDINGS: The cardiac silhouette, mediastinal and hilar contours are within normal limits. Mild peribronchial thickening and slight increased interstitial markings could suggest bronchitis or interstitial pneumonitis. This is more pronounced in the right lung. No definite infiltrates or effusions. No pulmonary lesions or pneumothorax. The bony thorax is intact. IMPRESSION: Possible bronchitis or interstitial pneumonitis. No infiltrates or effusions. Electronically Signed   By: Marijo Sanes M.D.   On: 05/08/2022 11:39          No data to display          No results found for: "NITRICOXIDE"      Assessment & Plan:   Asthma, intrinsic Overall, her asthma symptoms appear well controlled on low-dose Symbicort.  Cough seems to be related more so to upper airway irritation at baseline. FeNO nl today.  She did not have any perceived benefit to [place] when we tried her on this previously.  Continue Symbicort twice daily and as needed albuterol. Continue singulair and Xyzal for trigger prevention.   Patient Instructions  Continue Symbicort 2 puffs Twice daily. Brush tongue and rinse mouth afterwards. Use with spacer -Continue albuterol inhaler 2 puffs or 3 mL nebulizer every 6 hours as needed for shortness of breath or cough -Continue sinuglair 10 mg At bedtime -Continue protonix 40 mg Twice daily -Continue pepcid 10 mg daily  -Continue Xyzal 5 mg At bedtime  -Continue nasal irrigation treatments per ENT -Continue flonase 2 sprays each nostril Twice daily for nasal congestion/drainage   Delsym 2 tsp Twice daily for cough  Chlortab 4 mg over the counter At bedtime for cough Tessalon perles 1 capsule Three times a day for cough   Follow up with ENT as scheduled.    Pulmonary function testing scheduled today.   Follow up after PFTs with Dr. Melvyn Novas or Alanson Aly. If symptoms do not improve or worsen, please contact office for  sooner follow up or seek emergency care.    Chronic cough Unclear underlying etiology of chronic cough. Previous imaging unrevealing on specific cause. She never completed PFTs previously. Slight worsening; likely acute bronchitis given CXR findings today. No evidence of superimposed infection. We will treat her with empiric doxycycline 1 tab bid for 7 days and prednisone burst 40 mg daily for 5 days. Recommended she complete PFTs once improved and undergo CT chest ordered by PCP.   Chronic sinusitis Advised she return to ENT for evaluation of her sinus symptoms and hoarseness. See above plan.    Clayton Bibles, NP 05/08/2022  Pt aware and understands NP's role.

## 2022-05-08 NOTE — Assessment & Plan Note (Addendum)
Overall, her asthma symptoms appear well controlled on low-dose Symbicort.  Cough seems to be related more so to upper airway irritation at baseline. FeNO nl today.  She did not have any perceived benefit to [place] when we tried her on this previously.  Continue Symbicort twice daily and as needed albuterol. Continue singulair and Xyzal for trigger prevention.   Patient Instructions  Continue Symbicort 2 puffs Twice daily. Brush tongue and rinse mouth afterwards. Use with spacer -Continue albuterol inhaler 2 puffs or 3 mL nebulizer every 6 hours as needed for shortness of breath or cough -Continue sinuglair 10 mg At bedtime -Continue protonix 40 mg Twice daily -Continue pepcid 10 mg daily  -Continue Xyzal 5 mg At bedtime  -Continue nasal irrigation treatments per ENT -Continue flonase 2 sprays each nostril Twice daily for nasal congestion/drainage   Delsym 2 tsp Twice daily for cough  Chlortab 4 mg over the counter At bedtime for cough Tessalon perles 1 capsule Three times a day for cough   Follow up with ENT as scheduled.    Pulmonary function testing scheduled today.   Follow up after PFTs with Dr. Melvyn Novas or Alanson Aly. If symptoms do not improve or worsen, please contact office for sooner follow up or seek emergency care.

## 2022-05-24 ENCOUNTER — Encounter: Payer: Self-pay | Admitting: Family Medicine

## 2022-05-24 ENCOUNTER — Encounter: Payer: Self-pay | Admitting: Physician Assistant

## 2022-05-24 ENCOUNTER — Telehealth: Payer: Self-pay | Admitting: *Deleted

## 2022-05-24 DIAGNOSIS — Z85820 Personal history of malignant melanoma of skin: Secondary | ICD-10-CM

## 2022-05-24 DIAGNOSIS — Z8589 Personal history of malignant neoplasm of other organs and systems: Secondary | ICD-10-CM

## 2022-05-24 NOTE — Telephone Encounter (Signed)
Pt called stating she's having a lot of back pain & would like to know who Dr. Glennon Mac recommends for back injections.

## 2022-05-24 NOTE — Telephone Encounter (Signed)
Please check the status of CT.

## 2022-05-24 NOTE — Telephone Encounter (Signed)
[name]  [facility] (supporting Renee A Kuneff, DO) 1 hour ago (2:23 PM)    I just sent a message to [place] [facility] in reference to [facility] and possible new female dermatologist.

## 2022-05-27 ENCOUNTER — Other Ambulatory Visit: Payer: Self-pay

## 2022-05-27 ENCOUNTER — Encounter: Payer: Self-pay | Admitting: Sports Medicine

## 2022-05-27 NOTE — Telephone Encounter (Signed)
Pt messaged via mychart and I messaged back what Dr. Glennon Mac has said

## 2022-05-29 ENCOUNTER — Other Ambulatory Visit: Payer: Self-pay

## 2022-05-30 ENCOUNTER — Ambulatory Visit (INDEPENDENT_AMBULATORY_CARE_PROVIDER_SITE_OTHER): Payer: Medicare HMO | Admitting: Internal Medicine

## 2022-05-30 ENCOUNTER — Ambulatory Visit: Payer: Medicare HMO | Admitting: Nurse Practitioner

## 2022-05-30 ENCOUNTER — Encounter: Payer: Self-pay | Admitting: Nurse Practitioner

## 2022-05-30 DIAGNOSIS — J329 Chronic sinusitis, unspecified: Secondary | ICD-10-CM

## 2022-05-30 DIAGNOSIS — R058 Other specified cough: Secondary | ICD-10-CM | POA: Diagnosis not present

## 2022-05-30 DIAGNOSIS — J45909 Unspecified asthma, uncomplicated: Secondary | ICD-10-CM

## 2022-05-30 DIAGNOSIS — R911 Solitary pulmonary nodule: Secondary | ICD-10-CM

## 2022-05-30 DIAGNOSIS — R053 Chronic cough: Secondary | ICD-10-CM | POA: Diagnosis not present

## 2022-05-30 LAB — PULMONARY FUNCTION TEST
DL/VA % pred: 123 %
DL/VA: 5.31 ml/min/mmHg/L
DLCO cor % pred: 117 %
DLCO cor: 19.03 ml/min/mmHg
DLCO unc % pred: 116 %
DLCO unc: 18.86 ml/min/mmHg
FEF 25-75 Post: 3.35 L/sec
FEF 25-75 Pre: 3.55 L/sec
FEF2575-%Change-Post: -5 %
FEF2575-%Pred-Post: 212 %
FEF2575-%Pred-Pre: 225 %
FEV1-%Change-Post: 4 %
FEV1-%Pred-Post: 120 %
FEV1-%Pred-Pre: 115 %
FEV1-Post: 2.15 L
FEV1-Pre: 2.05 L
FEV1FVC-%Change-Post: 1 %
FEV1FVC-%Pred-Pre: 122 %
FEV6-%Change-Post: 3 %
FEV6-%Pred-Post: 101 %
FEV6-%Pred-Pre: 98 %
FEV6-Post: 2.3 L
FEV6-Pre: 2.22 L
FEV6FVC-%Pred-Post: 105 %
FEV6FVC-%Pred-Pre: 105 %
FVC-%Change-Post: 3 %
FVC-%Pred-Post: 96 %
FVC-%Pred-Pre: 93 %
FVC-Post: 2.3 L
FVC-Pre: 2.22 L
Post FEV1/FVC ratio: 94 %
Post FEV6/FVC ratio: 100 %
Pre FEV1/FVC ratio: 93 %
Pre FEV6/FVC Ratio: 100 %
RV % pred: 68 %
RV: 1.35 L
TLC % pred: 88 %
TLC: 3.8 L

## 2022-05-30 NOTE — Assessment & Plan Note (Addendum)
Compensated on current regimen. PFTs overall normal today. We will continue her on Symbicort as she has benefit from use. No changes to current regimen.   Patient Instructions  Continue Symbicort 2 puffs Twice daily. Brush tongue and rinse mouth afterwards. Use with spacer -Continue albuterol inhaler 2 puffs or 3 mL nebulizer every 6 hours as needed for shortness of breath or cough -Continue sinuglair 10 mg At bedtime -Continue protonix 40 mg Twice daily -Continue pepcid 10 mg daily  -Continue Xyzal 5 mg At bedtime  -Continue nasal irrigation treatments per ENT -Continue flonase 2 sprays each nostril Twice daily for nasal congestion/drainage  -Continue Chlortab 4 mg over the counter At bedtime for cough   Follow up with ENT as scheduled.   CT chest on 8/21 as previously scheduled    Follow up in 6 months with Dr. Melvyn Novas or Alanson Aly. If symptoms do not improve or worsen, please contact office for sooner follow up or seek emergency care.

## 2022-05-30 NOTE — Assessment & Plan Note (Signed)
Stable. Doing well on Symbicort without any worsening of cough. Seems to have an upper airway component. Still struggles with postnasal drainage/nasal congestion. She is on optimal treatment for GERD.

## 2022-05-30 NOTE — Patient Instructions (Signed)
Full PFT performed today. °

## 2022-05-30 NOTE — Progress Notes (Signed)
Full PFT performed today. °

## 2022-05-30 NOTE — Patient Instructions (Addendum)
Continue Symbicort 2 puffs Twice daily. Brush tongue and rinse mouth afterwards. Use with spacer -Continue albuterol inhaler 2 puffs or 3 mL nebulizer every 6 hours as needed for shortness of breath or cough -Continue sinuglair 10 mg At bedtime -Continue protonix 40 mg Twice daily -Continue pepcid 10 mg daily  -Continue Xyzal 5 mg At bedtime  -Continue nasal irrigation treatments per ENT -Continue flonase 2 sprays each nostril Twice daily for nasal congestion/drainage  -Continue Chlortab 4 mg over the counter At bedtime for cough   Follow up with ENT as scheduled.   CT chest on 8/21 as previously scheduled    Follow up in 6 months with Dr. Melvyn Novas or Alanson Aly. If symptoms do not improve or worsen, please contact office for sooner follow up or seek emergency care.

## 2022-05-30 NOTE — Progress Notes (Signed)
$'@Patient'd$  ID: [name], female    DOB: December 04, 1950, 71 y.o.   MRN: [id number]  Chief Complaint  Patient presents with   Follow-up    Patient here to go over PFT results.     Referring provider: Ma Hillock, DO  HPI: 71 year old female, never smoker followed for hx of asthma, chronic cough.  She is a patient of Dr. Gustavus Bryant and was last seen in office on 10/25/2021 by [facility] NP.  Past medical history significant for hypertension, aortic atherosclerosis, CAD, pulmonary hypertension, GERD, IBS, hypothyroidism, HLD, fibromyalgia, allergic rhinitis  TEST/EVENTS:  01/15/2021 CT chest with contrast: moderate aortic atherosclerosis. Occasional coronary artery calcifications. Tiny hiatal hernia. Linear atelectasis/scar in the lingula, similar to prior exam. 3 mm pleural based nodule in the right upper lobe, likely benign. 04/28/2021 CXR 1 view: Lung volumes normal.  Pulmonary vasculature and the cardiomediastinal silhouette are within normal limits. 10/25/2021: Allergen panel negative, eos 100  08/02/2021: OV with Dr. Melvyn Novas.  Initial consult for chronic cough since having pneumonia in 2019.  No report of dyspnea.  Was previously placed on Symbicort 80 and was also using [place] twice a day.  Cough worse with Symbicort -suspected upper airway cough syndrome.  Recommended stopping Symbicort on trial basis.  Aggressive treatment of GERD with Protonix 40 mg twice a day and Pepcid 20 mg in the evening.  Albuterol and budesonide nebs up to twice a day if needed.  6-day prednisone taper.  09/12/2021: OV with Telesha Deguzman NP for 6-week follow-up.  Did not notice major change in her symptoms.  She did experience some shortness of breath and chest tightness after burning some trash in her yard and working in the attic earlier in the week.  Felt some improvement but had not returned to baseline.  Persistent cough with primarily clear sputum production.  Allergy symptoms uncontrolled and currently being treated by ENT.  Felt  GERD symptoms improved with twice daily PPI.  Increased ICS budesonide nebs to 0.5 mg.  Prednisone taper.  10/25/2021: OV with Mateo Overbeck NP for 6 week follow up. She continues to experience persistent cough with clear sputum production. She occasionally experiences minimal shortness of breath upon exertion and with coughing spells. She continues to have post nasal drip and difficulties with her chronic sinusitis which she is scheduled to follow up with ENT this week to discuss. She also notes occasional breakthrough reflux, despite Twice daily PPI and pepcid nightly. She elevates the head of her bed and avoids triggering foods. She did notice some improvement in her symptoms with the prednisone taper and reports that her albuterol rescue inhaler does relieve her shortness of breath. FeNO nl. Persistent productive cough and minimal SOB with exertion. Suspect uncontrolled chronic sinusitis and GERD contributing. Hx of asthma - spirometry 2012 normal; no reports of wheezing. Slight improvement with prednisone. Trial triple therapy inhaler with Breztri 2 puffs Twice daily. Stop nebs. PRN albuterol. Instructed to follow up with ENT as scheduled. Advised to follow up with GI to discuss breakthrough GERD as she is already on Twice daily PPI and H2 once daily - may need EGD for further eval. Continue Singulair, Xyzal, saline nasal rinses. PFTs ordered today. Allergen panel and CBC with diff.   05/08/2022: OV with Sarahi Borland NP for overdue follow-up.  She is still having a persistent chronic cough, which is possibly slightly worse than it was previously.  She has not noticed a huge change..  Usually it is nonproductive but occasionally she will produce clear to  yellow sputum, which is usually in the morning.  Breathing is unchanged ; will get short of breath with strenuous activity or climbing stairs.  She also has persistent hoarseness, which she feels like is worse than it has been in the past.  She has chronic sinusitis and uses  multiple nasal rinses.  She was seen by ENT after her last visit but only discussed a spot on her tongue and did not discuss her hoarseness or her persistent chronic sinusitis.  Feels like her GERD symptoms are better controlled.  She denies any wheezing, increased shortness of breath, orthopnea, PND, hemoptysis, weight loss, anorexia, fatigue, dysphagia.  She was previously trialed on step up to [place].  Did not notice a huge difference when back to low-dose Symbicort.  Uses this with a spacer.  Never completed her PFTs because she did not want to get COVID tested again.  Her PCP has ordered a CT chest for evaluation of the previously identified small nodule. CXR with bronchitic changes - treated with doxy and prednisone.  05/30/2022: Today - follow up Patient presents today for follow up after undergoing pulmonary function testing, which was overall normal. She reports she has been doing well since I saw her last. Still has an occasional dry cough but it is stable. Her breathing is unchanged. Unsure if the last course of prednisone and antibiotics did much for her. She denies any recent wheezing, chest congestion, fevers, night seats, hemoptysis, anorexia, weight loss. She is on Symbicort Twice daily. Rarely uses albuterol. She still has some issues with postnasal drainage and nasal congestion. Due to see ENT next week. She uses flonase, xyzal, and singulair. Does nasal irrigations daily. Denies any headaches or purulent drainage. She has CT chest ordered by her PCP for nodule follow up next week.   Allergies  Allergen Reactions   Sulfamethoxazole-Trimethoprim Hives   Avelox [Moxifloxacin Hcl In Nacl]     Racing heart   Cephalexin Hives   Codeine Hives    Heart racing   Erythromycin Hives   Flagyl [Metronidazole]     Unknown reaction   Propranolol Hives   Sulfa Antibiotics Hives   Tetanus Toxoid Swelling    Reports a fever and headache with swelling.    Tramadol Hives   Statins Other (See  Comments)    Myalgia     Immunization History  Administered Date(s) Administered   Influenza-Unspecified 06/11/2019, 07/14/2020   Pneumococcal Conjugate-13 07/03/2016   Pneumococcal Polysaccharide-23 08/03/2019   Tdap 05/02/2014   Zoster, Live 10/14/2010    Past Medical History:  Diagnosis Date   Allergic rhinitis    Allergic rhinitis due to pollen 12/03/2019   Allergy    Anemia    Asthma    Cataract    Chest pain, unspecified 04/07/2014   Chicken pox    Colon polyp    Diverticulosis    Dysphagia    Dysrhythmia    Palpitations   Fibromyalgia    Food allergy    Gallstone    GERD (gastroesophageal reflux disease)    Hiatal hernia    Hypercholesteremia    Hypertension    Hypothyroidism    IBS (irritable bowel syndrome)    Ingrown toenail 08/23/2020   Migraine headache    occasionally   Osteoarthritis    Osteopenia    Pneumonia    PONV (postoperative nausea and vomiting)    Pulmonary hypertension ([place])    pt denies, pt states she was tested for it but was not diagnosed with  it   Rectal bleeding    SBO (small bowel obstruction) ([place]) 10/20/2017   Seasonal allergies    Thyroid nodule    Trigger finger of left and right ring fingers 01/11/2021    Tobacco History: Social History   Tobacco Use  Smoking Status Never  Smokeless Tobacco Never   Counseling given: Not Answered   Outpatient Medications Prior to Visit  Medication Sig Dispense Refill   albuterol (PROAIR HFA) 108 (90 Base) MCG/ACT inhaler Inhale 2 puffs into the lungs every 6 (six) hours as needed. 6.7 g 5   albuterol (PROVENTIL) (2.5 MG/3ML) 0.083% nebulizer solution Take 3 mLs (2.5 mg total) by nebulization every 6 (six) hours as needed. 75 mL 0   ARMOUR THYROID 15 MG tablet Take 1 tablet by mouth in the AM Once a day 90 tablet 3   ARMOUR THYROID 30 MG tablet Take 1 tablet by mouth in the AM once a day 90 tablet 3   Ascorbic Acid (VITAMIN C) 1000 MG tablet Take 3,000 mg by mouth daily.      atenolol (TENORMIN) 25 MG tablet Take 0.5-1 tablets (12.5-25 mg total) by mouth daily. 90 tablet 1   benzonatate (TESSALON) 200 MG capsule Take 1 capsule (200 mg total) by mouth 3 (three) times daily as needed for cough. 30 capsule 1   calcium carbonate (TUMS - DOSED IN MG ELEMENTAL CALCIUM) 500 MG chewable tablet Chew 3 tablets by mouth daily as needed for indigestion or heartburn.     Carboxymethylcellulose Sodium (THERATEARS OP) Place 1 drop into both eyes 2 (two) times daily.     clotrimazole (MYCELEX) 10 MG troche Take 1 tablet (10 mg total) by mouth 4 (four) times daily as needed. 24 tablet 3   Coenzyme Q10 (COQ-10) 100 MG CAPS Take 100 mg by mouth daily.     ezetimibe (ZETIA) 10 MG tablet Take 1 tablet (10 mg total) by mouth daily. 90 tablet 3   famotidine (PEPCID) 40 MG tablet Take 1 tablet (40 mg total) by mouth 2 (two) times daily. 60 tablet 5   fexofenadine (ALLERGY RELIEF) 180 MG tablet Take 1 tablet (180 mg total) by mouth daily. 90 tablet 1   fluticasone (FLONASE) 50 MCG/ACT nasal spray Place 2 sprays into both nostrils daily. 16 mL 11   folic acid (FOLVITE) 1 MG tablet SMARTSIG:2 Tablet(s) By Mouth     hydrochlorothiazide (MICROZIDE) 12.5 MG capsule Take 1 capsule (12.5 mg total) by mouth daily as needed. 90 capsule 1   hyoscyamine (LEVSIN) 0.125 MG tablet Take 1 tablet (0.125 mg total) by mouth every 6 (six) hours as needed. 120 tablet 5   levocetirizine (XYZAL) 5 MG tablet Take 1 tablet (5 mg total) by mouth every evening. 90 tablet 3   mometasone (ELOCON) 0.1 % lotion Apply 1 application topically daily as needed (ear irritation).      montelukast (SINGULAIR) 10 MG tablet TAKE 1 TABLET BY MOUTH EVERYDAY AT BEDTIME 90 tablet 1   mupirocin ointment (BACTROBAN) 2 % Apply 1 Application topically 2 (two) times daily. 30 g 2   ondansetron (ZOFRAN-ODT) 8 MG disintegrating tablet Take 1 tablet (8 mg total) by mouth every 8 (eight) hours as needed for nausea or vomiting. 30 tablet 1    pantoprazole (PROTONIX) 40 MG tablet Take 1 tablet (40 mg total) by mouth 2 (two) times daily. 180 tablet 3   Probiotic CAPS Take 1 capsule by mouth daily.     rosuvastatin (CRESTOR) 10 MG tablet TAKE  1 TABLET BY MOUTH 3 TIMES A WEEK. 36 tablet 0   SODIUM FLUORIDE 5000 PPM 1.1 % PSTE Take by mouth daily.     SYMBICORT 80-4.5 MCG/ACT inhaler SMARTSIG:2 Puff(s) Via Inhaler Morning-Night     tiZANidine (ZANAFLEX) 4 MG tablet Take 1 tablet (4 mg total) by mouth every 6 (six) hours as needed for muscle spasms. 120 tablet 5   Turmeric (QC TUMERIC COMPLEX PO) Take by mouth.     valACYclovir (VALTREX) 1000 MG tablet 2 tabs at onset, rpt 2 tabs in 12 hours once. 20 tablet 1   VITAMIN A PO Take 1 tablet by mouth daily.     zinc gluconate 50 MG tablet Take 100 mg by mouth daily.     zolpidem (AMBIEN) 5 MG tablet Take 1 tablet (5 mg total) by mouth at bedtime. As needed 90 tablet 1   Cholecalciferol (VITAMIN D) 125 MCG (5000 UT) CAPS Take 5,000 Units by mouth daily.  (Patient not taking: Reported on 05/08/2022)     meloxicam (MOBIC) 15 MG tablet Take 1 tablet (15 mg total) by mouth daily. (Patient not taking: Reported on 05/08/2022) 90 tablet 1   No facility-administered medications prior to visit.     Review of Systems:   Constitutional: No weight loss or gain, night sweats, fevers, chills, fatigue, or lassitude. HEENT: No headaches, difficulty swallowing, tooth/dental problems, or sore throat. No sneezing, itching, ear ache. +nasal congestion, post nasal drip (chronic), hoarseness CV:  No chest pain, orthopnea, PND, swelling in lower extremities, anasarca, dizziness, palpitations, syncope Resp: +shortness of breath with strenuous exertion (baseline); chronic cough. No excess mucus or change in color of mucus. No hemoptysis. No wheezing.  No chest wall deformity GI:  No heartburn, indigestion, abdominal pain, nausea, vomiting, diarrhea, change in bowel habits, loss of appetite, bloody stools.  GU: No  dysuria, change in color of urine, urgency or frequency.  No flank pain, no hematuria  Skin: No rash, lesions, ulcerations MSK:  No joint pain or swelling.  No decreased range of motion.  No back pain. Neuro: No dizziness or lightheadedness.  Psych: No depression or anxiety. Mood stable.     Physical Exam:  BP 132/68 (BP Location: Right Arm, Patient Position: Sitting, Cuff Size: Normal)   Pulse 96   Temp 98.5 F (36.9 C) (Oral)   Ht '4\' 11"'$  (1.499 m)   Wt 137 lb (62.1 kg)   SpO2 98%   BMI 27.67 kg/m   GEN: Pleasant, interactive, well-nourished; in no acute distress. HEENT:  Normocephalic and atraumatic. EACs patent bilaterally. TM pearly gray with present light reflex bilaterally. PERRLA. Sclera white. Nasal turbinates erythematous, moist and patent bilaterally.  Right nasal polyp.  No rhinorrhea present. Oropharynx erythematous and moist, without exudate or edema. No lesions, ulcerations NECK:  Supple w/ fair ROM. No JVD present. Normal carotid impulses w/o bruits. Thyroid symmetrical with no goiter or nodules palpated. No lymphadenopathy.   CV: RRR, no m/r/g, no peripheral edema. Pulses intact, +2 bilaterally. No cyanosis, pallor or clubbing. PULMONARY:  Unlabored, regular breathing. Clear bilaterally A&P w/o wheezes/rales/rhonchi. No accessory muscle use. No dullness to percussion. GI: BS present and normoactive. Soft, non-tender to palpation. No organomegaly or masses detected. No CVA tenderness. MSK: No erythema, warmth or tenderness. Cap refil <2 sec all extrem. No deformities or joint swelling noted.  Neuro: A/Ox3. No focal deficits noted.   Skin: Warm, no lesions or rashe Psych: Normal affect and behavior. Judgement and thought content appropriate.  Lab Results:  CBC    Component Value Date/Time   WBC 5.5 04/24/2022 0840   RBC 4.54 04/24/2022 0840   HGB 13.1 04/24/2022 0840   HCT 38.8 04/24/2022 0840   PLT 236.0 04/24/2022 0840   MCV 85.4 04/24/2022 0840   MCH  28.3 04/28/2021 0934   MCHC 33.8 04/24/2022 0840   RDW 14.1 04/24/2022 0840   LYMPHSABS 1.4 10/25/2021 0950   MONOABS 0.7 10/25/2021 0950   EOSABS 0.1 10/25/2021 0950   BASOSABS 0.1 10/25/2021 0950    BMET    Component Value Date/Time   NA 136 04/24/2022 0840   NA 138 01/04/2021 1001   K 4.5 04/24/2022 0840   CL 100 04/24/2022 0840   CO2 28 04/24/2022 0840   GLUCOSE 107 (H) 04/24/2022 0840   BUN 10 04/24/2022 0840   BUN 12 01/04/2021 1001   CREATININE 0.71 04/24/2022 0840   CREATININE 0.73 11/28/2020 1342   CALCIUM 9.7 04/24/2022 0840   GFRNONAA >60 04/28/2021 0934    BNP No results found for: "BNP"   Imaging:  DG Chest 2 View  Result Date: 05/08/2022 CLINICAL DATA:  Chronic cough. EXAM: CHEST - 2 VIEW COMPARISON:  Chest x-ray 08/13/2018 and chest CT 01/15/2021 FINDINGS: The cardiac silhouette, mediastinal and hilar contours are within normal limits. Mild peribronchial thickening and slight increased interstitial markings could suggest bronchitis or interstitial pneumonitis. This is more pronounced in the right lung. No definite infiltrates or effusions. No pulmonary lesions or pneumothorax. The bony thorax is intact. IMPRESSION: Possible bronchitis or interstitial pneumonitis. No infiltrates or effusions. Electronically Signed   By: Marijo Sanes M.D.   On: 05/08/2022 11:39         Latest Ref Rng & Units 05/30/2022    9:59 AM  PFT Results  FVC-Pre L 2.22  P  FVC-Predicted Pre % 93  P  FVC-Post L 2.30  P  FVC-Predicted Post % 96  P  Pre FEV1/FVC % % 93  P  Post FEV1/FCV % % 94  P  FEV1-Pre L 2.05  P  FEV1-Predicted Pre % 115  P  FEV1-Post L 2.15  P  DLCO uncorrected ml/min/mmHg 18.86  P  DLCO UNC% % 116  P  DLCO corrected ml/min/mmHg 19.03  P  DLCO COR %Predicted % 117  P  DLVA Predicted % 123  P  TLC L 3.80  P  TLC % Predicted % 88  P  RV % Predicted % 68  P    P Preliminary result    No results found for: "NITRICOXIDE"      Assessment & Plan:    Asthma, intrinsic Compensated on current regimen. PFTs overall normal today. We will continue her on Symbicort as she has benefit from use. No changes to current regimen.   Patient Instructions  Continue Symbicort 2 puffs Twice daily. Brush tongue and rinse mouth afterwards. Use with spacer -Continue albuterol inhaler 2 puffs or 3 mL nebulizer every 6 hours as needed for shortness of breath or cough -Continue sinuglair 10 mg At bedtime -Continue protonix 40 mg Twice daily -Continue pepcid 10 mg daily  -Continue Xyzal 5 mg At bedtime  -Continue nasal irrigation treatments per ENT -Continue flonase 2 sprays each nostril Twice daily for nasal congestion/drainage  -Continue Chlortab 4 mg over the counter At bedtime for cough   Follow up with ENT as scheduled.   CT chest on 8/21 as previously scheduled    Follow up in 6 months with Dr. Melvyn Novas  or Katie [place] Kretz,NP. If symptoms do not improve or worsen, please contact office for sooner follow up or seek emergency care.    Chronic sinusitis She is still struggling with chronic sinusitis and postnasal drainage, likely impacting her cough. Plans to see ENT next week. Currently on nasal irrigations and flonase nasal spray. Continue allegra and singulair for trigger prevention.   Chronic cough Stable. Doing well on Symbicort without any worsening of cough. Seems to have an upper airway component. Still struggles with postnasal drainage/nasal congestion. She is on optimal treatment for GERD.   Pulmonary nodule CT chest from 2022 with small 3 mm RUL nodule, likely benign as she is a never smoker. She has repeat CT scheduled for next week for surveillance.      Clayton Bibles, NP 05/30/2022  Pt aware and understands NP's role.

## 2022-05-30 NOTE — Assessment & Plan Note (Signed)
She is still struggling with chronic sinusitis and postnasal drainage, likely impacting her cough. Plans to see ENT next week. Currently on nasal irrigations and flonase nasal spray. Continue allegra and singulair for trigger prevention.

## 2022-05-30 NOTE — Assessment & Plan Note (Signed)
CT chest from 2022 with small 3 mm RUL nodule, likely benign as she is a never smoker. She has repeat CT scheduled for next week for surveillance.

## 2022-06-03 ENCOUNTER — Ambulatory Visit (INDEPENDENT_AMBULATORY_CARE_PROVIDER_SITE_OTHER): Payer: Medicare HMO

## 2022-06-03 DIAGNOSIS — R911 Solitary pulmonary nodule: Secondary | ICD-10-CM | POA: Diagnosis not present

## 2022-06-03 MED ORDER — IOHEXOL 300 MG/ML  SOLN
100.0000 mL | Freq: Once | INTRAMUSCULAR | Status: AC | PRN
  Administered 2022-06-03: 75 mL via INTRAVENOUS

## 2022-06-04 ENCOUNTER — Telehealth: Payer: Self-pay | Admitting: Family Medicine

## 2022-06-04 DIAGNOSIS — K760 Fatty (change of) liver, not elsewhere classified: Secondary | ICD-10-CM | POA: Insufficient documentation

## 2022-06-04 NOTE — Telephone Encounter (Signed)
Please inform patient:   3 mm nodule in right upper lobe appears stable.  No further evaluation required

## 2022-06-05 ENCOUNTER — Encounter: Payer: Self-pay | Admitting: Interventional Cardiology

## 2022-06-05 MED ORDER — EZETIMIBE 10 MG PO TABS: 10.0000 mg | ORAL_TABLET | Freq: Every day | ORAL | 3 refills | Status: DC

## 2022-06-05 MED ORDER — ROSUVASTATIN CALCIUM 10 MG PO TABS: ORAL_TABLET | ORAL | 0 refills | Status: DC

## 2022-06-05 NOTE — Telephone Encounter (Signed)
Spoke with pt regarding labs and instructions.   

## 2022-06-06 ENCOUNTER — Other Ambulatory Visit: Payer: Self-pay

## 2022-06-06 MED ORDER — ONDANSETRON 8 MG PO TBDP: 8.0000 mg | ORAL_TABLET | Freq: Three times a day (TID) | ORAL | 0 refills | Status: DC | PRN

## 2022-06-10 ENCOUNTER — Telehealth: Payer: Self-pay | Admitting: Nurse Practitioner

## 2022-06-10 DIAGNOSIS — E079 Disorder of thyroid, unspecified: Secondary | ICD-10-CM | POA: Diagnosis not present

## 2022-06-10 DIAGNOSIS — K219 Gastro-esophageal reflux disease without esophagitis: Secondary | ICD-10-CM | POA: Diagnosis not present

## 2022-06-10 DIAGNOSIS — J384 Edema of larynx: Secondary | ICD-10-CM | POA: Diagnosis not present

## 2022-06-12 NOTE — Telephone Encounter (Signed)
This is an FYI for the provider.  °

## 2022-06-14 DIAGNOSIS — M5416 Radiculopathy, lumbar region: Secondary | ICD-10-CM | POA: Diagnosis not present

## 2022-06-14 DIAGNOSIS — M5451 Vertebrogenic low back pain: Secondary | ICD-10-CM | POA: Diagnosis not present

## 2022-06-14 DIAGNOSIS — M546 Pain in thoracic spine: Secondary | ICD-10-CM | POA: Diagnosis not present

## 2022-06-23 ENCOUNTER — Other Ambulatory Visit: Payer: Self-pay | Admitting: Physician Assistant

## 2022-06-24 DIAGNOSIS — L814 Other melanin hyperpigmentation: Secondary | ICD-10-CM | POA: Diagnosis not present

## 2022-06-24 DIAGNOSIS — L988 Other specified disorders of the skin and subcutaneous tissue: Secondary | ICD-10-CM | POA: Diagnosis not present

## 2022-06-24 DIAGNOSIS — C44311 Basal cell carcinoma of skin of nose: Secondary | ICD-10-CM | POA: Diagnosis not present

## 2022-06-24 DIAGNOSIS — D0371 Melanoma in situ of right lower limb, including hip: Secondary | ICD-10-CM | POA: Diagnosis not present

## 2022-06-24 DIAGNOSIS — L578 Other skin changes due to chronic exposure to nonionizing radiation: Secondary | ICD-10-CM | POA: Diagnosis not present

## 2022-06-27 ENCOUNTER — Ambulatory Visit (INDEPENDENT_AMBULATORY_CARE_PROVIDER_SITE_OTHER): Payer: Medicare HMO

## 2022-06-27 DIAGNOSIS — Z0000 Encounter for general adult medical examination without abnormal findings: Secondary | ICD-10-CM | POA: Diagnosis not present

## 2022-06-27 NOTE — Patient Instructions (Signed)

## 2022-06-27 NOTE — Progress Notes (Addendum)
Subjective:   Rita Levine is a 71 y.o. female who presents for Medicare Annual (Subsequent) preventive examination.  I connected with  [name] on 06/27/22 by an audio only telemedicine application and verified that I am speaking with the correct person using two identifiers.   I discussed the limitations, risks, security and privacy concerns of performing an evaluation and management service by telephone and the availability of in person appointments. I also discussed with the patient that there may be a patient responsible charge related to this service. The patient expressed understanding and verbally consented to this telephonic visit.  Location of Patient: home  Location of Provider: office  List any persons and their role that are participating in the visit with the patient.   [place] Rocky Crafts, CMA  Review of Systems    Defer to PCP       Objective:    There were no vitals filed for this visit. There is no height or weight on file to calculate BMI.     06/27/2022    1:19 PM 04/28/2021    9:16 AM 10/23/2020    9:46 AM 07/21/2020    8:30 AM 08/05/2016    7:38 AM  Advanced Directives  Does Patient Have a Medical Advance Directive? Yes No Yes Yes Yes  Type of Estate agent of [place];Living will  Healthcare Power of [place];Living will Healthcare Power of [place];Living will   Does patient want to make changes to medical advance directive?    No - Patient declined   Copy of Healthcare Power of Attorney in Chart?    No - copy requested No - copy requested  Would patient like information on creating a medical advance directive?  No - Patient declined       Current Medications (verified) Outpatient Encounter Medications as of 06/27/2022  Medication Sig   albuterol (PROAIR HFA) 108 (90 Base) MCG/ACT inhaler Inhale 2 puffs into the lungs every 6 (six) hours as needed.   albuterol (PROVENTIL) (2.5 MG/3ML) 0.083% nebulizer solution Take  3 mLs (2.5 mg total) by nebulization every 6 (six) hours as needed.   ARMOUR THYROID 15 MG tablet Take 1 tablet by mouth in the AM Once a day   ARMOUR THYROID 30 MG tablet Take 1 tablet by mouth in the AM once a day   Ascorbic Acid (VITAMIN C) 1000 MG tablet Take 3,000 mg by mouth daily.   atenolol (TENORMIN) 25 MG tablet Take 0.5-1 tablets (12.5-25 mg total) by mouth daily.   benzonatate (TESSALON) 200 MG capsule Take 1 capsule (200 mg total) by mouth 3 (three) times daily as needed for cough.   calcium carbonate (TUMS - DOSED IN MG ELEMENTAL CALCIUM) 500 MG chewable tablet Chew 3 tablets by mouth daily as needed for indigestion or heartburn.   Carboxymethylcellulose Sodium (THERATEARS OP) Place 1 drop into both eyes 2 (two) times daily.   Cholecalciferol (VITAMIN D) 125 MCG (5000 UT) CAPS Take 5,000 Units by mouth daily.  (Patient not taking: Reported on 05/08/2022)   clotrimazole (MYCELEX) 10 MG troche Take 1 tablet (10 mg total) by mouth 4 (four) times daily as needed.   Coenzyme Q10 (COQ-10) 100 MG CAPS Take 100 mg by mouth daily.   ezetimibe (ZETIA) 10 MG tablet Take 1 tablet (10 mg total) by mouth daily.   famotidine (PEPCID) 40 MG tablet TAKE 1 TABLET BY MOUTH TWICE A DAY   fexofenadine (ALLERGY RELIEF) 180 MG tablet Take 1  tablet (180 mg total) by mouth daily.   fluticasone (FLONASE) 50 MCG/ACT nasal spray Place 2 sprays into both nostrils daily.   folic acid (FOLVITE) 1 MG tablet SMARTSIG:2 Tablet(s) By Mouth   hydrochlorothiazide (MICROZIDE) 12.5 MG capsule Take 1 capsule (12.5 mg total) by mouth daily as needed.   hyoscyamine (LEVSIN) 0.125 MG tablet Take 1 tablet (0.125 mg total) by mouth every 6 (six) hours as needed.   levocetirizine (XYZAL) 5 MG tablet Take 1 tablet (5 mg total) by mouth every evening.   meloxicam (MOBIC) 15 MG tablet Take 1 tablet (15 mg total) by mouth daily. (Patient not taking: Reported on 05/08/2022)   mometasone (ELOCON) 0.1 % lotion Apply 1 application  topically daily as needed (ear irritation).    montelukast (SINGULAIR) 10 MG tablet TAKE 1 TABLET BY MOUTH EVERYDAY AT BEDTIME   mupirocin ointment (BACTROBAN) 2 % Apply 1 Application topically 2 (two) times daily.   ondansetron (ZOFRAN-ODT) 8 MG disintegrating tablet Take 1 tablet (8 mg total) by mouth every 8 (eight) hours as needed for nausea or vomiting.   pantoprazole (PROTONIX) 40 MG tablet Take 1 tablet (40 mg total) by mouth 2 (two) times daily.   Probiotic CAPS Take 1 capsule by mouth daily.   rosuvastatin (CRESTOR) 10 MG tablet TAKE 1 TABLET BY MOUTH 3 TIMES A WEEK.   SODIUM FLUORIDE 5000 PPM 1.1 % PSTE Take by mouth daily.   SYMBICORT 80-4.5 MCG/ACT inhaler SMARTSIG:2 Puff(s) Via Inhaler Morning-Night   tiZANidine (ZANAFLEX) 4 MG tablet Take 1 tablet (4 mg total) by mouth every 6 (six) hours as needed for muscle spasms.   Turmeric (QC TUMERIC COMPLEX PO) Take by mouth.   valACYclovir (VALTREX) 1000 MG tablet 2 tabs at onset, rpt 2 tabs in 12 hours once.   VITAMIN A PO Take 1 tablet by mouth daily.   zinc gluconate 50 MG tablet Take 100 mg by mouth daily.   zolpidem (AMBIEN) 5 MG tablet Take 1 tablet (5 mg total) by mouth at bedtime. As needed   No facility-administered encounter medications on file as of 06/27/2022.    Allergies (verified) Sulfamethoxazole-trimethoprim, Avelox [moxifloxacin hcl in nacl], Cephalexin, Codeine, Erythromycin, Flagyl [metronidazole], Propranolol, Sulfa antibiotics, Tetanus toxoid, Tramadol, and Statins   History: Past Medical History:  Diagnosis Date   Allergic rhinitis    Allergic rhinitis due to pollen 12/03/2019   Allergy    Anemia    Asthma    Cataract    Chest pain, unspecified 04/07/2014   Chicken pox    Colon polyp    Diverticulosis    Dysphagia    Dysrhythmia    Palpitations   Fibromyalgia    Food allergy    Gallstone    GERD (gastroesophageal reflux disease)    Hiatal hernia    Hypercholesteremia    Hypertension     Hypothyroidism    IBS (irritable bowel syndrome)    Ingrown toenail 08/23/2020   Migraine headache    occasionally   Osteoarthritis    Osteopenia    Pneumonia    PONV (postoperative nausea and vomiting)    Pulmonary hypertension (HCC)    pt denies, pt states she was tested for it but was not diagnosed with it   Rectal bleeding    SBO (small bowel obstruction) (HCC) 10/20/2017   Seasonal allergies    Thyroid nodule    Trigger finger of left and right ring fingers 01/11/2021   Past Surgical History:  Procedure Laterality Date  APPENDECTOMY  1982   BREAST EXCISIONAL BIOPSY Right 1995   benign   BREAST LUMPECTOMY  1991   CARPAL TUNNEL RELEASE Right 2006   Right wrist   CESAREAN SECTION     x 2   CHOLECYSTECTOMY N/A 07/25/2020   Procedure: LAPAROSCOPIC CHOLECYSTECTOMY;  Surgeon: Almond Lint, MD;  Location: [facility] OR;  Service: General;  Laterality: N/A;   COLON RESECTION  1990   COLONOSCOPY     CT ABDOMEN PELVIS W ([facility] HX)  01/03/2020   IMAGE MRI brain:  04/2012   No focal IAC or inner ear lesion to explain hearing loss. Slightly greater than expected number of subcortical T2- hyperintensities bilaterally.  These are nonspecific.  They can be seen in the setting of chronic migraine headaches, demyelinating process, chronic microvascular ischemic, prior infection or inflammation, or vasculitis   IMAGE MRI lumbar:  05/01/2006   Mild central canal stenosis with facet arthropathy and mildly bulging disc at L4-5.  There is mild narrowing in the left lateral recess at this level.  No definite neural impingement.  Appearance at this level has not markedly Moderately severe facet arthropathy at L5-S1 with mild interval progression of a diffuse broad based disc bulge.  There is mild biforaminal narrowing.  Central canal is open   LAPAROSCOPIC ABDOMINAL EXPLORATION  2019   adhesion removal> caused bowel blockage    NASAL SEPTUM SURGERY  2004   TONSILLECTOMY  1965   TOTAL ABDOMINAL  HYSTERECTOMY W/ BILATERAL SALPINGOOPHORECTOMY  1988   TUBAL LIGATION  1974   UPPER GASTROINTESTINAL ENDOSCOPY  2020   Family History  Problem Relation Age of Onset   Hyperlipidemia Father    Hypertension Father    Arthritis Father    Diabetes Father    Asthma Father    COPD Father    Early death Father    Parkinson's disease Father    Hypertension Mother    Hyperlipidemia Mother    Macular degeneration Mother    Arthritis Mother    Hearing loss Mother    Heart disease Mother    Kidney disease Mother    Throat cancer Brother        lung, and tongue   Arthritis Brother    COPD Brother    Diabetes type II Sister    COPD Sister    Heart disease Sister    Hypertension Sister    Thyroid cancer Sister    Lung cancer Sister    Early death Sister    COPD Sister    Breast cancer Maternal Aunt    Lung cancer Other        uncle   Emphysema Maternal Aunt    Emphysema Maternal Uncle    Clotting disorder Sister    Brain cancer Sister        brain tumor- not malignant   Breast cancer Cousin    Cancer Sister    Alcohol abuse Sister    COPD Sister    Early death Sister    Alcohol abuse Brother    Arthritis Brother    Depression Brother    Diabetes Brother    Hypercholesterolemia Brother    Colon cancer Neg Hx    Esophageal cancer Neg Hx    Rectal cancer Neg Hx    Stomach cancer Neg Hx    Social History   Socioeconomic History   Marital status: Married    Spouse name: Celesta Aver   Number of children: 2   Years of education: Not  on file   Highest education level: Some college, no degree  Occupational History   Occupation: homemaker  Tobacco Use   Smoking status: Never   Smokeless tobacco: Never  Vaping Use   Vaping Use: Never used  Substance and Sexual Activity   Alcohol use: Yes    Comment: very rarely   Drug use: No   Sexual activity: Yes    Partners: Male    Comment: married  Other Topics Concern   Not on file  Social History Narrative   Marital  status/children/pets: married, 2 children   Education/employment: HS grad. retired   Field seismologist:      -smoke alarm in the home:Yes     - wears seatbelt: Yes     - Feels safe in their relationships: Yes   Social Determinants of Health   Financial Resource Strain: Low Risk  (06/27/2022)   Overall Financial Resource Strain (CARDIA)    Difficulty of Paying Living Expenses: Not hard at all  Food Insecurity: No Food Insecurity (06/27/2022)   Hunger Vital Sign    Worried About Running Out of Food in the Last Year: Never true    Ran Out of Food in the Last Year: Never true  Transportation Needs: No Transportation Needs (06/27/2022)   PRAPARE - Administrator, Civil Service (Medical): No    Lack of Transportation (Non-Medical): No  Physical Activity: Sufficiently Active (06/27/2022)   Exercise Vital Sign    Days of Exercise per Week: 5 days    Minutes of Exercise per Session: 30 min  Stress: No Stress Concern Present (06/27/2022)   [employer] of Occupational Health - Occupational Stress Questionnaire    Feeling of Stress : Not at all  Social Connections: Socially Integrated (06/27/2022)   Social Connection and Isolation Panel [NHANES]    Frequency of Communication with Friends and Family: Three times a week    Frequency of Social Gatherings with Friends and Family: Three times a week    Attends Religious Services: More than 4 times per year    Active Member of Clubs or Organizations: Not on file    Attends Banker Meetings: More than 4 times per year    Marital Status: Married    Tobacco Counseling Counseling given: Not Answered   Clinical Intake:  Pre-visit preparation completed: No  Pain : No/denies pain        How often do you need to have someone help you when you read instructions, pamphlets, or other written materials from your doctor or pharmacy?: 1 - Never  Diabetic?no  Interpreter Needed?: No      Activities of Daily Living     06/27/2022    1:19 PM  In your present state of health, do you have any difficulty performing the following activities:  Hearing? 0  Vision? 0  Difficulty concentrating or making decisions? 0  Walking or climbing stairs? 0  Dressing or bathing? 0  Doing errands, shopping? 0  Preparing Food and eating ? N  Using the Toilet? N  In the past six months, have you accidently leaked urine? N  Do you have problems with loss of bowel control? N  Managing your Medications? N  Managing your Finances? N  Housekeeping or managing your Housekeeping? N    Patient Care Team: Natalia Leatherwood, DO as PCP - General (Family Medicine) Corky Crafts, MD as PCP - Cardiology (Cardiology) Rachael Fee, MD as Attending Physician (Gastroenterology) Glyn Ade, PA-C as  Physician Assistant (Dermatology)  Indicate any recent Medical Services you may have received from other than [facility] providers in the past year (date may be approximate).     Assessment:   This is a routine wellness examination for Elta.  Hearing/Vision screen No results found.  Dietary issues and exercise activities discussed:     Goals Addressed   None   Depression Screen    06/27/2022    1:17 PM 04/24/2022    8:50 AM 11/06/2021   11:04 AM 06/12/2021    6:06 PM 11/28/2020    1:10 PM 07/18/2020    1:14 PM 12/03/2019    9:55 AM  PHQ 2/9 Scores  PHQ - 2 Score 0 0 0 0 0 0 0  PHQ- 9 Score  5 1        Fall Risk    06/27/2022    1:19 PM 11/05/2021   11:44 AM 06/12/2021    6:14 PM 10/23/2020    9:45 AM 07/18/2020    1:14 PM  Fall Risk   Falls in the past year? 0 0 0 0 0  Number falls in past yr: 0  0 0 0  Injury with Fall? 0  0 0 0  Risk for fall due to :   No Fall Risks    Follow up Falls evaluation completed  Falls evaluation completed      FALL RISK PREVENTION PERTAINING TO THE HOME:  Any stairs in or around the home? Yes  If so, are there any without handrails? Yes  Home free of loose throw rugs in  walkways, pet beds, electrical cords, etc? Yes  Adequate lighting in your home to reduce risk of falls? Yes   ASSISTIVE DEVICES UTILIZED TO PREVENT FALLS:  Life alert? No  Use of a cane, walker or w/c? No  Grab bars in the bathroom? Yes  Shower chair or bench in shower? Yes  Elevated toilet seat or a handicapped toilet? Yes   TIMED UP AND GO:  Was the test performed? No .  Length of time to ambulate 10 feet: n/a sec.     Cognitive Function:        06/27/2022    1:20 PM 06/12/2021    6:15 PM  6CIT Screen  What Year? 0 points 0 points  What month? 0 points 0 points  What time? 0 points 0 points  Count back from 20 0 points 0 points  Months in reverse 0 points 0 points  Repeat phrase 0 points 0 points  Total Score 0 points 0 points    Immunizations Immunization History  Administered Date(s) Administered   Influenza-Unspecified 06/11/2019, 07/14/2020   Pneumococcal Conjugate-13 07/03/2016   Pneumococcal Polysaccharide-23 08/03/2019   Tdap 05/02/2014   Zoster, Live 10/14/2010    TDAP status: Up to date  Flu Vaccine status: Declined, Education has been provided regarding the importance of this vaccine but patient still declined. Advised may receive this vaccine at local pharmacy or Health Dept. Aware to provide a copy of the vaccination record if obtained from local pharmacy or Health Dept. Verbalized acceptance and understanding.  Pneumococcal vaccine status: Up to date  Covid-19 vaccine status: Information provided on how to obtain vaccines.   Qualifies for Shingles Vaccine? Yes   Zostavax completed No   Shingrix Completed?: No.    Education has been provided regarding the importance of this vaccine. Patient has been advised to call insurance company to determine out of pocket expense if they have not yet  received this vaccine. Advised may also receive vaccine at local pharmacy or Health Dept. Verbalized acceptance and understanding.  Screening Tests Health  Maintenance  Topic Date Due   COVID-19 Vaccine (1) Never done   INFLUENZA VACCINE  05/14/2022   DEXA SCAN  01/04/2023   MAMMOGRAM  10/30/2023   COLONOSCOPY (Pts 45-55yrs Insurance coverage will need to be confirmed)  08/05/2026   Pneumonia Vaccine 59+ Years old  Completed   Hepatitis C Screening  Completed   HPV VACCINES  Aged Out   TETANUS/TDAP  Discontinued   Zoster Vaccines- Shingrix  Discontinued    Health Maintenance  Health Maintenance Due  Topic Date Due   COVID-19 Vaccine (1) Never done   INFLUENZA VACCINE  05/14/2022    Colorectal cancer screening: Type of screening: Colonoscopy. Completed 08/05/2016. Repeat every 10 years  Mammogram status: Ordered 04/24/22. Pt provided with contact info and advised to call to schedule appt.   Bone Density status: Completed 01/03/2021. Results reflect: Bone density results: OSTEOPENIA. Repeat every 2 years.  Lung Cancer Screening: (Low Dose CT Chest recommended if Age 82-80 years, 30 pack-year currently smoking OR have quit w/in 15years.) does not qualify.   Lung Cancer Screening Referral: n/a  Additional Screening:  Hepatitis C Screening: does qualify; Completed 11/28/2020  Vision Screening: Recommended annual ophthalmology exams for early detection of glaucoma and other disorders of the eye. Is the patient up to date with their annual eye exam?  Yes  Who is the provider or what is the name of the office in which the patient attends annual eye exams? [facility] If pt is not established with a provider, would they like to be referred to a provider to establish care? No .   Dental Screening: Recommended annual dental exams for proper oral hygiene  Community Resource Referral / Chronic Care Management: CRR required this visit?  No   CCM required this visit?  No      Plan:     I have personally reviewed and noted the following in the patient's chart:   Medical and social history Use of alcohol, tobacco or  illicit drugs  Current medications and supplements including opioid prescriptions. Patient is not currently taking opioid prescriptions. Functional ability and status Nutritional status Physical activity Advanced directives List of other physicians Hospitalizations, surgeries, and ER visits in previous 12 months Vitals Screenings to include cognitive, depression, and falls Referrals and appointments  In addition, I have reviewed and discussed with patient certain preventive protocols, quality metrics, and best practice recommendations. A written personalized care plan for preventive services as well as general preventive health recommendations were provided to patient.     Filomena Jungling, CMA   06/27/2022   Nurse Notes: Non-Face to Face or Face to Face 8 minute visit Encounter   Ms. Landeck , Thank you for taking time to come for your Medicare Wellness Visit. I appreciate your ongoing commitment to your health goals. Please review the following plan we discussed and let me know if I can assist you in the future.   These are the goals we discussed:  Goals   None     This is a list of the screening recommended for you and due dates:  Health Maintenance  Topic Date Due   COVID-19 Vaccine (1) Never done   Flu Shot  05/14/2022   DEXA scan (bone density measurement)  01/04/2023   Mammogram  10/30/2023   Colon Cancer Screening  08/05/2026   Pneumonia Vaccine  Completed   Hepatitis C Screening: USPSTF Recommendation to screen - Ages 108-79 yo.  Completed   HPV Vaccine  Aged Out   Tetanus Vaccine  Discontinued   Zoster (Shingles) Vaccine  Discontinued

## 2022-07-06 ENCOUNTER — Other Ambulatory Visit: Payer: Self-pay | Admitting: Family Medicine

## 2022-07-08 NOTE — Telephone Encounter (Signed)
Please advise 

## 2022-07-08 NOTE — Telephone Encounter (Signed)
Pt is requesting Symbicort refill, however I do not see that medication listed on her list of meds. She reached out to the Pharmacy, and was  informed it may take up to a week before it is available. She would like [place] to give her a call to see if the process can be expedited.

## 2022-07-08 NOTE — Telephone Encounter (Signed)
completed

## 2022-07-15 DIAGNOSIS — M546 Pain in thoracic spine: Secondary | ICD-10-CM | POA: Diagnosis not present

## 2022-07-15 DIAGNOSIS — M545 Low back pain, unspecified: Secondary | ICD-10-CM | POA: Diagnosis not present

## 2022-07-22 DIAGNOSIS — M546 Pain in thoracic spine: Secondary | ICD-10-CM | POA: Diagnosis not present

## 2022-07-22 NOTE — Progress Notes (Unsigned)
Cardiology Office Note   Date:  07/22/2022   ID:  AIREN Levine, DOB 1950-11-20, MRN [id number]  PCP:  Rita Hillock, DO    No chief complaint on file.  Aortic atherosclerosis  Wt Readings from Last 3 Encounters:  05/30/22 137 lb (62.1 kg)  05/08/22 140 lb 9.6 oz (63.8 kg)  04/24/22 138 lb 12.8 oz (63 kg)       History of Present Illness: Rita Levine is a 71 y.o. female    With aortic atherosclerosis.  She is friends with Chrissie Noa and Ardis Rowan, who are my patients as well.    3/21 CT: Vascular/Lymphatic: Advanced atherosclerotic calcifications involving the aorta and iliac arteries for the patient's age.    She has no family h/o CAD.  Mother was 36 when she passed away.  Siblings without CAD.     She had a negative ETT in 2015.   She had intolerance to Zocor in the past. Now tolerating Crestor 3x/week.      In the past, BP cuff will register irregular HR, and she feels lightheaded.  No syncope.     Monitor in 12/21 showed: "Normal sinus rhythm. Rare, brief runs of PACs. No sustained pathologic arrhythmias."   BP has been controlled.  Not using losartan very often.  Does not take if systolic is less than 782 mm Hg.  Palpitations have occurred in the past few months.   4/22 CT chest reviewed: moderate aortic atherosclerosis.     Past Medical History:  Diagnosis Date   Allergic rhinitis    Allergic rhinitis due to pollen 12/03/2019   Allergy    Anemia    Asthma    Cataract    Chest pain, unspecified 04/07/2014   Chicken pox    Colon polyp    Diverticulosis    Dysphagia    Dysrhythmia    Palpitations   Fibromyalgia    Food allergy    Gallstone    GERD (gastroesophageal reflux disease)    Hiatal hernia    Hypercholesteremia    Hypertension    Hypothyroidism    IBS (irritable bowel syndrome)    Ingrown toenail 08/23/2020   Migraine headache    occasionally   Osteoarthritis    Osteopenia    Pneumonia    PONV (postoperative nausea and  vomiting)    Pulmonary hypertension ([place])    pt denies, pt states she was tested for it but was not diagnosed with it   Rectal bleeding    SBO (small bowel obstruction) ([place]) 10/20/2017   Seasonal allergies    Thyroid nodule    Trigger finger of left and right ring fingers 01/11/2021    Past Surgical History:  Procedure Laterality Date   APPENDECTOMY  1982   BREAST EXCISIONAL BIOPSY Right 1995   benign   BREAST LUMPECTOMY  1991   CARPAL TUNNEL RELEASE Right 2006   Right wrist   CESAREAN SECTION     x 2   CHOLECYSTECTOMY N/A 07/25/2020   Procedure: LAPAROSCOPIC CHOLECYSTECTOMY;  Surgeon: [name], MD;  Location: [facility];  Service: General;  Laterality: N/A;   COLON RESECTION  1990   COLONOSCOPY     [place] ([place] [facility])  01/03/2020   IMAGE MRI brain:  04/2012   No focal IAC or inner ear lesion to explain hearing loss. Slightly greater than expected number of subcortical T2- hyperintensities bilaterally.  These are nonspecific.  They can be seen in the setting  of chronic migraine headaches, demyelinating process, chronic microvascular ischemic, prior infection or inflammation, or vasculitis   IMAGE MRI lumbar:  05/01/2006   Mild central canal stenosis with facet arthropathy and mildly bulging disc at L4-5.  There is mild narrowing in the left lateral recess at this level.  No definite neural impingement.  Appearance at this level has not markedly Moderately severe facet arthropathy at L5-S1 with mild interval progression of a diffuse broad based disc bulge.  There is mild biforaminal narrowing.  Central canal is open   LAPAROSCOPIC ABDOMINAL EXPLORATION  2019   adhesion removal> caused bowel blockage    NASAL SEPTUM SURGERY  2004   TONSILLECTOMY  1965   TOTAL ABDOMINAL HYSTERECTOMY W/ BILATERAL SALPINGOOPHORECTOMY  1988   TUBAL LIGATION  1974   UPPER GASTROINTESTINAL ENDOSCOPY  2020     Current Outpatient Medications  Medication Sig Dispense Refill   albuterol  (PROAIR HFA) 108 (90 Base) MCG/ACT inhaler Inhale 2 puffs into the lungs every 6 (six) hours as needed. 6.7 g 5   albuterol (PROVENTIL) (2.5 MG/3ML) 0.083% nebulizer solution Take 3 mLs (2.5 mg total) by nebulization every 6 (six) hours as needed. 75 mL 0   ARMOUR THYROID 15 MG tablet Take 1 tablet by mouth in the AM Once a day 90 tablet 3   ARMOUR THYROID 30 MG tablet Take 1 tablet by mouth in the AM once a day 90 tablet 3   Ascorbic Acid (VITAMIN C) 1000 MG tablet Take 3,000 mg by mouth daily.     atenolol (TENORMIN) 25 MG tablet Take 0.5-1 tablets (12.5-25 mg total) by mouth daily. 90 tablet 1   benzonatate (TESSALON) 200 MG capsule Take 1 capsule (200 mg total) by mouth 3 (three) times daily as needed for cough. 30 capsule 1   calcium carbonate (TUMS - DOSED IN MG ELEMENTAL CALCIUM) 500 MG chewable tablet Chew 3 tablets by mouth daily as needed for indigestion or heartburn.     Carboxymethylcellulose Sodium (THERATEARS OP) Place 1 drop into both eyes 2 (two) times daily.     Cholecalciferol (VITAMIN D) 125 MCG (5000 UT) CAPS Take 5,000 Units by mouth daily.  (Patient not taking: Reported on 05/08/2022)     clotrimazole (MYCELEX) 10 MG troche Take 1 tablet (10 mg total) by mouth 4 (four) times daily as needed. 24 tablet 3   Coenzyme Q10 (COQ-10) 100 MG CAPS Take 100 mg by mouth daily.     ezetimibe (ZETIA) 10 MG tablet Take 1 tablet (10 mg total) by mouth daily. 90 tablet 3   famotidine (PEPCID) 40 MG tablet TAKE 1 TABLET BY MOUTH TWICE A DAY 180 tablet 1   fexofenadine (ALLERGY RELIEF) 180 MG tablet Take 1 tablet (180 mg total) by mouth daily. 90 tablet 1   fluticasone (FLONASE) 50 MCG/ACT nasal spray Place 2 sprays into both nostrils daily. 16 mL 11   folic acid (FOLVITE) 1 MG tablet SMARTSIG:2 Tablet(s) By Mouth     hydrochlorothiazide (MICROZIDE) 12.5 MG capsule Take 1 capsule (12.5 mg total) by mouth daily as needed. 90 capsule 1   hyoscyamine (LEVSIN) 0.125 MG tablet Take 1 tablet (0.125 mg  total) by mouth every 6 (six) hours as needed. 120 tablet 5   levocetirizine (XYZAL) 5 MG tablet Take 1 tablet (5 mg total) by mouth every evening. 90 tablet 3   meloxicam (MOBIC) 15 MG tablet Take 1 tablet (15 mg total) by mouth daily. (Patient not taking: Reported on 05/08/2022) 90 tablet  1   mometasone (ELOCON) 0.1 % lotion Apply 1 application topically daily as needed (ear irritation).      montelukast (SINGULAIR) 10 MG tablet TAKE 1 TABLET BY MOUTH EVERYDAY AT BEDTIME 90 tablet 1   mupirocin ointment (BACTROBAN) 2 % Apply 1 Application topically 2 (two) times daily. 30 g 2   ondansetron (ZOFRAN-ODT) 8 MG disintegrating tablet Take 1 tablet (8 mg total) by mouth every 8 (eight) hours as needed for nausea or vomiting. 30 tablet 0   pantoprazole (PROTONIX) 40 MG tablet Take 1 tablet (40 mg total) by mouth 2 (two) times daily. 180 tablet 3   Probiotic CAPS Take 1 capsule by mouth daily.     rosuvastatin (CRESTOR) 10 MG tablet TAKE 1 TABLET BY MOUTH 3 TIMES A WEEK. 36 tablet 0   SODIUM FLUORIDE 5000 PPM 1.1 % PSTE Take by mouth daily.     SYMBICORT 80-4.5 MCG/ACT inhaler INHALE TWO PUFFS INTO THE LUNGS IN THE MORNING AND AT BEDTIME 30.6 each 12   tiZANidine (ZANAFLEX) 4 MG tablet Take 1 tablet (4 mg total) by mouth every 6 (six) hours as needed for muscle spasms. 120 tablet 5   Turmeric (QC TUMERIC COMPLEX PO) Take by mouth.     valACYclovir (VALTREX) 1000 MG tablet 2 tabs at onset, rpt 2 tabs in 12 hours once. 20 tablet 1   VITAMIN A PO Take 1 tablet by mouth daily.     zinc gluconate 50 MG tablet Take 100 mg by mouth daily.     zolpidem (AMBIEN) 5 MG tablet Take 1 tablet (5 mg total) by mouth at bedtime. As needed 90 tablet 1   No current facility-administered medications for this visit.    Allergies:   Sulfamethoxazole-trimethoprim, Avelox [moxifloxacin hcl in nacl], Cephalexin, Codeine, Erythromycin, Flagyl [metronidazole], Propranolol, Sulfa antibiotics, Tetanus toxoid, Tramadol, and  Statins    Social History:  The patient  reports that she has never smoked. She has never used smokeless tobacco. She reports current alcohol use. She reports that she does not use drugs.   Family History:  The patient's ***family history includes Alcohol abuse in her brother and sister; Arthritis in her brother, brother, father, and mother; Asthma in her father; Brain cancer in her sister; Breast cancer in her cousin and maternal aunt; COPD in her brother, father, sister, sister, and sister; Cancer in her sister; Clotting disorder in her sister; Depression in her brother; Diabetes in her brother and father; Diabetes type II in her sister; Early death in her father, sister, and sister; Emphysema in her maternal aunt and maternal uncle; Hearing loss in her mother; Heart disease in her mother and sister; Hypercholesterolemia in her brother; Hyperlipidemia in her father and mother; Hypertension in her father, mother, and sister; Kidney disease in her mother; Lung cancer in her sister and another family member; Macular degeneration in her mother; Parkinson's disease in her father; Throat cancer in her brother; Thyroid cancer in her sister.    ROS:  Please see the history of present illness.   Otherwise, review of systems are positive for ***.   All other systems are reviewed and negative.    PHYSICAL EXAM: VS:  There were no vitals taken for this visit. , BMI There is no height or weight on file to calculate BMI. GEN: Well nourished, well developed, in no acute distress HEENT: normal Neck: no JVD, carotid bruits, or masses Cardiac: ***RRR; no murmurs, rubs, or gallops,no edema  Respiratory:  clear to auscultation bilaterally,  normal work of breathing GI: soft, nontender, nondistended, + BS MS: no deformity or atrophy Skin: warm and dry, no rash Neuro:  Strength and sensation are intact Psych: euthymic mood, full affect   EKG:   The ekg ordered today demonstrates ***   Recent  Labs: 04/24/2022: ALT 17; BUN 10; Creatinine, Ser 0.71; Hemoglobin 13.1; Platelets 236.0; Potassium 4.5; Sodium 136; TSH 1.61   Lipid Panel    Component Value Date/Time   CHOL 156 04/24/2022 0840   CHOL 167 01/04/2021 1001   TRIG 163.0 (H) 04/24/2022 0840   HDL 56.40 04/24/2022 0840   HDL 49 01/04/2021 1001   CHOLHDL 3 04/24/2022 0840   VLDL 32.6 04/24/2022 0840   LDLCALC 67 04/24/2022 0840   LDLCALC 92 01/04/2021 1001   LDLCALC 90 11/28/2020 1342     Other studies Reviewed: Additional studies/ records that were reviewed today with results demonstrating: ***.   ASSESSMENT AND PLAN:  Aortic atherosclerosis: Palpitations: PACs noted in the past on a monitor. Hyperlipidemia: Had some difficulty with tolerating statins.  Was up to 3 times a week Crestor.   Current medicines are reviewed at length with the patient today.  The patient concerns regarding her medicines were addressed.  The following changes have been made:  No change***  Labs/ tests ordered today include: *** No orders of the defined types were placed in this encounter.   Recommend 150 minutes/week of aerobic exercise Low fat, low carb, high fiber diet recommended  Disposition:   FU in ***   Signed, Larae Grooms, MD  07/22/2022 11:04 AM    [facility] [place], [place], [place]  [address] Phone: [phone]; Fax: [phone]

## 2022-07-23 ENCOUNTER — Ambulatory Visit: Payer: Medicare HMO | Attending: Interventional Cardiology | Admitting: Interventional Cardiology

## 2022-07-23 ENCOUNTER — Encounter: Payer: Self-pay | Admitting: Family Medicine

## 2022-07-23 VITALS — BP 134/70 | HR 54 | Ht 59.0 in | Wt 137.0 lb

## 2022-07-23 DIAGNOSIS — I700 Atherosclerosis of aorta: Secondary | ICD-10-CM | POA: Diagnosis not present

## 2022-07-23 DIAGNOSIS — E782 Mixed hyperlipidemia: Secondary | ICD-10-CM

## 2022-07-23 DIAGNOSIS — I491 Atrial premature depolarization: Secondary | ICD-10-CM | POA: Diagnosis not present

## 2022-07-23 DIAGNOSIS — R0609 Other forms of dyspnea: Secondary | ICD-10-CM

## 2022-07-23 NOTE — Patient Instructions (Signed)
Medication Instructions:  Your physician recommends that you continue on your current medications as directed. Please refer to the Current Medication list given to you today.  *If you need a refill on your cardiac medications before your next appointment, please call your pharmacy*   Lab Work: none If you have labs (blood work) drawn today and your tests are completely normal, you will receive your results only by: [place] (if you have MyChart) OR A paper copy in the mail If you have any lab test that is abnormal or we need to change your treatment, we will call you to review the results.   Testing/Procedures: Your physician has requested that you have an echocardiogram. Echocardiography is a painless test that uses sound waves to create images of your heart. It provides your doctor with information about the size and shape of your heart and how well your heart's chambers and valves are working. This procedure takes approximately one hour. There are no restrictions for this procedure.    Follow-Up: At [facility], you and your health needs are our priority.  As part of our continuing mission to provide you with exceptional heart care, we have created designated Provider Care Teams.  These Care Teams include your primary Cardiologist (physician) and Advanced Practice Providers (APPs -  Physician Assistants and Nurse Practitioners) who all work together to provide you with the care you need, when you need it.  We recommend signing up for the patient portal called "MyChart".  Sign up information is provided on this After Visit Summary.  MyChart is used to connect with patients for Virtual Visits (Telemedicine).  Patients are able to view lab/test results, encounter notes, upcoming appointments, etc.  Non-urgent messages can be sent to your provider as well.   To learn more about what you can do with MyChart, go to [email or web address].    Your next appointment:   12  month(s)  The format for your next appointment:   In Person  Provider:   Larae Grooms, MD     Other Instructions    Important Information About Sugar

## 2022-07-26 ENCOUNTER — Ambulatory Visit (INDEPENDENT_AMBULATORY_CARE_PROVIDER_SITE_OTHER): Payer: Medicare HMO | Admitting: Family Medicine

## 2022-07-26 ENCOUNTER — Encounter: Payer: Self-pay | Admitting: Family Medicine

## 2022-07-26 VITALS — BP 137/82 | HR 83 | Temp 98.6°F | Wt 140.0 lb

## 2022-07-26 DIAGNOSIS — H1132 Conjunctival hemorrhage, left eye: Secondary | ICD-10-CM

## 2022-07-26 DIAGNOSIS — T148XXA Other injury of unspecified body region, initial encounter: Secondary | ICD-10-CM

## 2022-07-26 NOTE — Progress Notes (Unsigned)
Rita Levine , [birth year], 71 y.o., female MRN: [id number] Patient Care Team    Relationship Specialty Notifications Start End  Ma Hillock, DO PCP - General Family Medicine  12/01/19   Jettie Booze, MD PCP - Cardiology Cardiology  01/26/20   Milus Banister, MD Attending Physician Gastroenterology  12/01/19   Warren Danes, PA-C Physician Assistant Dermatology  04/18/22   Laurence Aly, [place]  Optometry  07/26/22     Chief Complaint  Patient presents with   Eye Problem     Subjective: Pt presents for an OV with complaints of recurrent some subconjunctival hemorrhage.  Patient reports that can occur and her right eye, but most commonly affects her left eye.  She states she was seen by her eye doctor who does not feel it is caused by her eyes and she has a "systemic "issue at hand.  She saw an ENT doctor and they felt her thyroid supplement should be increased. Patient is wondering if there is something further that needs to be done concerning her recurrent subconjunctival eye hemorrhage.  She denies fever, chills, increased stress, heavy lifting or straining of any nature.  She has been prescribed Mobic and diclofenac off-and-on over the last few years.  Today she states she would like a refill on the diclofenac.  She stopped the Mobic.  She is taking turmeric as well. She denies hematochezia or melena.      06/27/2022    1:17 PM 04/24/2022    8:50 AM 11/06/2021   11:04 AM 06/12/2021    6:06 PM 11/28/2020    1:10 PM  Depression screen PHQ 2/9  Decreased Interest 0 0 0 0 0  Down, Depressed, Hopeless 0 0 0 0 0  PHQ - 2 Score 0 0 0 0 0  Altered sleeping  0 0    Tired, decreased energy  3 1    Change in appetite  1 0    Feeling bad or failure about yourself   0 0    Trouble concentrating  1 0    Moving slowly or fidgety/restless  0 0    Suicidal thoughts  0 0    PHQ-9 Score  5 1      Allergies  Allergen Reactions   Sulfamethoxazole-Trimethoprim Hives    Avelox [Moxifloxacin Hcl In Nacl]     Racing heart   Cephalexin Hives   Codeine Hives    Heart racing   Cymbalta [Duloxetine Hcl]    Erythromycin Hives   Flagyl [Metronidazole]     Unknown reaction   Paxlovid [Nirmatrelvir-Ritonavir]    Propranolol Hives   Sulfa Antibiotics Hives   Tetanus Toxoid Swelling    Reports a fever and headache with swelling.    Tramadol Hives   Statins Other (See Comments)    Myalgia    Social History   Social History Narrative   Marital status/children/pets: married, 2 children   Education/employment: HS grad. retired   Engineer, materials:      -smoke alarm in the home:Yes     - wears seatbelt: Yes     - Feels safe in their relationships: Yes   Past Medical History:  Diagnosis Date   Allergic rhinitis    Allergic rhinitis due to pollen 12/03/2019   Allergy    Anemia    Asthma    Cataract    Chest pain, unspecified 04/07/2014   Chicken pox    Colon polyp    Diverticulosis  Dysphagia    Dysrhythmia    Palpitations   Fibromyalgia    Food allergy    Gallstone    GERD (gastroesophageal reflux disease)    Hiatal hernia    Hypercholesteremia    Hypertension    Hypothyroidism    IBS (irritable bowel syndrome)    Ingrown toenail 08/23/2020   Migraine headache    occasionally   Osteoarthritis    Osteopenia    Pneumonia    PONV (postoperative nausea and vomiting)    Pulmonary hypertension ([place])    pt denies, pt states she was tested for it but was not diagnosed with it   Rectal bleeding    SBO (small bowel obstruction) ([place]) 10/20/2017   Seasonal allergies    Thyroid nodule    Trigger finger of left and right ring fingers 01/11/2021   Past Surgical History:  Procedure Laterality Date   APPENDECTOMY  1982   BREAST EXCISIONAL BIOPSY Right 1995   benign   BREAST LUMPECTOMY  1991   CARPAL TUNNEL RELEASE Right 2006   Right wrist   CESAREAN SECTION     x 2   CHOLECYSTECTOMY N/A 07/25/2020   Procedure: LAPAROSCOPIC CHOLECYSTECTOMY;   Surgeon: [name], MD;  Location: [facility];  Service: General;  Laterality: N/A;   [place] ([place] HX)  01/03/2020   IMAGE MRI brain:  04/2012   No focal IAC or inner ear lesion to explain hearing loss. Slightly greater than expected number of subcortical T2- hyperintensities bilaterally.  These are nonspecific.  They can be seen in the setting of chronic migraine headaches, demyelinating process, chronic microvascular ischemic, prior infection or inflammation, or vasculitis   IMAGE MRI lumbar:  05/01/2006   Mild central canal stenosis with facet arthropathy and mildly bulging disc at L4-5.  There is mild narrowing in the left lateral recess at this level.  No definite neural impingement.  Appearance at this level has not markedly Moderately severe facet arthropathy at L5-S1 with mild interval progression of a diffuse broad based disc bulge.  There is mild biforaminal narrowing.  Central canal is open   LAPAROSCOPIC ABDOMINAL EXPLORATION  2019   adhesion removal> caused bowel blockage    NASAL SEPTUM SURGERY  2004   TONSILLECTOMY  1965   TOTAL ABDOMINAL HYSTERECTOMY W/ BILATERAL SALPINGOOPHORECTOMY  1988   TUBAL LIGATION  1974   UPPER GASTROINTESTINAL ENDOSCOPY  2020   Family History  Problem Relation Age of Onset   Hyperlipidemia Father    Hypertension Father    Arthritis Father    Diabetes Father    Asthma Father    COPD Father    Early death Father    Parkinson's disease Father    Hypertension Mother    Hyperlipidemia Mother    Macular degeneration Mother    Arthritis Mother    Hearing loss Mother    Heart disease Mother    Kidney disease Mother    Throat cancer Brother        lung, and tongue   Arthritis Brother    COPD Brother    Diabetes type II Sister    COPD Sister    Heart disease Sister    Hypertension Sister    Thyroid cancer Sister    Lung cancer Sister    Early death Sister    COPD Sister    Breast cancer  Maternal Aunt    Lung cancer Other  uncle   Emphysema Maternal Aunt    Emphysema Maternal Uncle    Clotting disorder Sister    Brain cancer Sister        brain tumor- not malignant   Breast cancer Cousin    Cancer Sister    Alcohol abuse Sister    COPD Sister    Early death Sister    Alcohol abuse Brother    Arthritis Brother    Depression Brother    Diabetes Brother    Hypercholesterolemia Brother    Colon cancer Neg Hx    Esophageal cancer Neg Hx    Rectal cancer Neg Hx    Stomach cancer Neg Hx    Allergies as of 07/26/2022       Reactions   Sulfamethoxazole-trimethoprim Hives   Avelox [moxifloxacin Hcl In Nacl]    Racing heart   Cephalexin Hives   Codeine Hives   Heart racing   Cymbalta [duloxetine Hcl]    Erythromycin Hives   Flagyl [metronidazole]    Unknown reaction   Paxlovid [nirmatrelvir-ritonavir]    Propranolol Hives   Sulfa Antibiotics Hives   Tetanus Toxoid Swelling   Reports a fever and headache with swelling.    Tramadol Hives   Statins Other (See Comments)   Myalgia        Medication List        Accurate as of July 26, 2022 11:59 PM. If you have any questions, ask your nurse or doctor.          STOP taking these medications    benzonatate 200 MG capsule Commonly known as: TESSALON Stopped by: Howard Pouch, DO   meloxicam 15 MG tablet Commonly known as: MOBIC Stopped by: Howard Pouch, DO   QC TUMERIC COMPLEX PO Stopped by: Howard Pouch, DO       TAKE these medications    albuterol (2.5 MG/3ML) 0.083% nebulizer solution Commonly known as: PROVENTIL Take 3 mLs (2.5 mg total) by nebulization every 6 (six) hours as needed.   albuterol 108 (90 Base) MCG/ACT inhaler Commonly known as: ProAir HFA Inhale 2 puffs into the lungs every 6 (six) hours as needed.   Armour Thyroid 15 MG tablet Generic drug: thyroid Take 1 tablet by mouth in the AM Once a day   Armour Thyroid 30 MG tablet Generic drug: thyroid Take 1  tablet by mouth in the AM once a day   atenolol 25 MG tablet Commonly known as: TENORMIN Take 0.5-1 tablets (12.5-25 mg total) by mouth daily.   calcium carbonate 500 MG chewable tablet Commonly known as: TUMS - dosed in mg elemental calcium Chew 3 tablets by mouth daily as needed for indigestion or heartburn.   clotrimazole 10 MG troche Commonly known as: MYCELEX Take 1 tablet (10 mg total) by mouth 4 (four) times daily as needed.   CoQ-10 100 MG Caps Take 100 mg by mouth daily.   DICLOFENAC POTASSIUM PO Take by mouth.   ezetimibe 10 MG tablet Commonly known as: ZETIA Take 1 tablet (10 mg total) by mouth daily.   famotidine 40 MG tablet Commonly known as: PEPCID TAKE 1 TABLET BY MOUTH TWICE A DAY   fexofenadine 180 MG tablet Commonly known as: Allergy Relief Take 1 tablet (180 mg total) by mouth daily.   fluconazole 100 MG tablet Commonly known as: DIFLUCAN Take 100 mg by mouth daily.   fluticasone 50 MCG/ACT nasal spray Commonly known as: FLONASE Place 2 sprays into both nostrils daily.   folic acid  1 MG tablet Commonly known as: FOLVITE SMARTSIG:2 Tablet(s) By Mouth   hydrochlorothiazide 12.5 MG capsule Commonly known as: MICROZIDE Take 1 capsule (12.5 mg total) by mouth daily as needed.   hyoscyamine 0.125 MG tablet Commonly known as: LEVSIN Take 1 tablet (0.125 mg total) by mouth every 6 (six) hours as needed.   levocetirizine 5 MG tablet Commonly known as: XYZAL Take 1 tablet (5 mg total) by mouth every evening.   mometasone 0.1 % lotion Commonly known as: ELOCON Apply 1 application topically daily as needed (ear irritation).   montelukast 10 MG tablet Commonly known as: SINGULAIR TAKE 1 TABLET BY MOUTH EVERYDAY AT BEDTIME   mupirocin ointment 2 % Commonly known as: BACTROBAN Apply 1 Application topically 2 (two) times daily.   ondansetron 8 MG disintegrating tablet Commonly known as: ZOFRAN-ODT Take 1 tablet (8 mg total) by mouth every 8  (eight) hours as needed for nausea or vomiting.   pantoprazole 40 MG tablet Commonly known as: PROTONIX Take 1 tablet (40 mg total) by mouth 2 (two) times daily.   Probiotic Caps Take 1 capsule by mouth daily.   rosuvastatin 10 MG tablet Commonly known as: CRESTOR TAKE 1 TABLET BY MOUTH 3 TIMES A WEEK.   Sodium Fluoride 5000 PPM 1.1 % Pste Generic drug: Sodium Fluoride Take by mouth daily.   Symbicort 80-4.5 MCG/ACT inhaler Generic drug: budesonide-formoterol INHALE TWO PUFFS INTO THE LUNGS IN THE MORNING AND AT BEDTIME   THERATEARS OP Place 1 drop into both eyes 2 (two) times daily.   tiZANidine 4 MG tablet Commonly known as: ZANAFLEX Take 1 tablet (4 mg total) by mouth every 6 (six) hours as needed for muscle spasms.   valACYclovir 1000 MG tablet Commonly known as: VALTREX 2 tabs at onset, rpt 2 tabs in 12 hours once.   VITAMIN A PO Take 1 tablet by mouth daily.   vitamin C 1000 MG tablet Take 3,000 mg by mouth daily.   Vitamin D 125 MCG (5000 UT) Caps Take 5,000 Units by mouth daily.   zinc gluconate 50 MG tablet Take 100 mg by mouth daily.   zolpidem 5 MG tablet Commonly known as: AMBIEN Take 1 tablet (5 mg total) by mouth at bedtime. As needed        All past medical history, surgical history, allergies, family history, immunizations andmedications were updated in the EMR today and reviewed under the history and medication portions of their EMR.     ROS Negative, with the exception of above mentioned in HPI   Objective:  BP 137/82   Pulse 83   Temp 98.6 F (37 C)   Wt 140 lb (63.5 kg)   SpO2 97%   BMI 28.28 kg/m  Body mass index is 28.28 kg/m. Physical Exam Vitals and nursing note reviewed.  Constitutional:      General: She is not in acute distress.    Appearance: Normal appearance. She is normal weight. She is not ill-appearing or toxic-appearing.  Eyes:     General: Lids are normal.        Right eye: No foreign body, discharge or  hordeolum.     Extraocular Movements: Extraocular movements intact.     Conjunctiva/sclera:     Right eye: Right conjunctiva is not injected. Hemorrhage present. No chemosis or exudate.    Pupils: Pupils are equal, round, and reactive to light.   Cardiovascular:     Rate and Rhythm: Normal rate and regular rhythm.  Musculoskeletal:  Cervical back: Neck supple.  Skin:    General: Skin is warm and dry.     Findings: No bruising or rash.  Neurological:     Mental Status: She is alert and oriented to person, place, and time. Mental status is at baseline.  Psychiatric:        Mood and Affect: Mood normal.        Behavior: Behavior normal.        Thought Content: Thought content normal.        Judgment: Judgment normal.      No results found. No results found. No results found for this or any previous visit (from the past 24 hour(s)).  Assessment/Plan: Rita Levine is a 71 y.o. female present for OV for  Bruising/Subconjunctival hemorrhage of left eye Vss. Urged her to discontinue the turmeric use while using NSAIDs.  Turmeric taken with daily NSAIDs can cause increased bruising and may be the cause of her symptoms. - Protime-INR - Basic Metabolic Panel (BMET) - Sedimentation rate Follow-up dependent upon results.  Hopefully discontinuing turmeric, assuming labs are all normal, will help resolve the issue.  Hypothyroidism: Discussed with patient her last TSH collected very recently is in the normal range.  Would not recommend increasing her thyroid supplement at this time and doing so would likely place her in a hyperthyroid state.  Reviewed expectations re: course of current medical issues. Discussed self-management of symptoms. Outlined signs and symptoms indicating need for more acute intervention. Patient verbalized understanding and all questions were answered. Patient received an After-Visit Summary.    Orders Placed This Encounter  Procedures   Protime-INR    Basic Metabolic Panel (BMET)   Sedimentation rate   No orders of the defined types were placed in this encounter.  Referral Orders  No referral(s) requested today     Note is dictated utilizing voice recognition software. Although note has been proof read prior to signing, occasional typographical errors still can be missed. If any questions arise, please do not hesitate to call for verification.   electronically signed by:  Howard Pouch, DO  [place]

## 2022-07-26 NOTE — Patient Instructions (Signed)
Stop tumeric. Continue diclofenac

## 2022-07-27 LAB — BASIC METABOLIC PANEL
BUN: 9 mg/dL (ref 7–25)
CO2: 26 mmol/L (ref 20–32)
Calcium: 9.5 mg/dL (ref 8.6–10.4)
Chloride: 100 mmol/L (ref 98–110)
Creat: 0.71 mg/dL (ref 0.60–1.00)
Glucose, Bld: 164 mg/dL — ABNORMAL HIGH (ref 65–99)
Potassium: 4.5 mmol/L (ref 3.5–5.3)
Sodium: 137 mmol/L (ref 135–146)

## 2022-07-27 LAB — PROTIME-INR
INR: 0.9
Prothrombin Time: 9.9 s (ref 9.0–11.5)

## 2022-07-28 LAB — SEDIMENTATION RATE: Sed Rate: 2 mm/h (ref 0–30)

## 2022-07-30 ENCOUNTER — Telehealth: Payer: Self-pay | Admitting: Family Medicine

## 2022-07-30 NOTE — Telephone Encounter (Signed)
Please inform patient her kidney function is normal. Electrolytes are all normal. Inflammatory marker was normal. Bleeding time was normal.

## 2022-07-30 NOTE — Telephone Encounter (Signed)
LM for pt to return call to discuss.  MyChart message sent with results.

## 2022-07-30 NOTE — Telephone Encounter (Signed)
Rita Levine spoke with patient regarding results/recommendations.

## 2022-08-01 ENCOUNTER — Telehealth: Payer: Self-pay | Admitting: Sports Medicine

## 2022-08-01 ENCOUNTER — Encounter: Payer: Self-pay | Admitting: Family Medicine

## 2022-08-01 ENCOUNTER — Ambulatory Visit (INDEPENDENT_AMBULATORY_CARE_PROVIDER_SITE_OTHER): Payer: Medicare HMO | Admitting: Sports Medicine

## 2022-08-01 ENCOUNTER — Encounter: Payer: Self-pay | Admitting: Sports Medicine

## 2022-08-01 ENCOUNTER — Ambulatory Visit (INDEPENDENT_AMBULATORY_CARE_PROVIDER_SITE_OTHER): Payer: Medicare HMO

## 2022-08-01 ENCOUNTER — Ambulatory Visit: Payer: Medicare HMO | Admitting: Sports Medicine

## 2022-08-01 DIAGNOSIS — M1711 Unilateral primary osteoarthritis, right knee: Secondary | ICD-10-CM | POA: Diagnosis not present

## 2022-08-01 DIAGNOSIS — H1132 Conjunctival hemorrhage, left eye: Secondary | ICD-10-CM | POA: Insufficient documentation

## 2022-08-01 DIAGNOSIS — M2550 Pain in unspecified joint: Secondary | ICD-10-CM | POA: Diagnosis not present

## 2022-08-01 HISTORY — DX: Conjunctival hemorrhage, left eye: H11.32

## 2022-08-01 MED ORDER — DICLOFENAC SODIUM 75 MG PO TBEC: 75.0000 mg | DELAYED_RELEASE_TABLET | Freq: Two times a day (BID) | ORAL | 1 refills | Status: DC

## 2022-08-01 NOTE — Assessment & Plan Note (Signed)
Doing a full rheumatoid work-up.

## 2022-08-01 NOTE — Telephone Encounter (Signed)
Visco approval please, has failed greater than 6 weeks of home physical therapy, analgesics, steroid injections, x-ray confirmed osteoarthritis, right knee only.

## 2022-08-01 NOTE — Assessment & Plan Note (Signed)
Amand returns, she is a very pleasant 71 year old female with a long history of knee osteoarthritis, injected in 2015, and then in May 2023.  She had about 3 months of relief now with recurrence of pain. Repeat injection today but due to short duration of relief we will get her approved for viscosupplementation. Return to see me to start Visco if and when needed.

## 2022-08-01 NOTE — Progress Notes (Signed)
    Procedures performed today:    Procedure: Real-time Ultrasound Guided injection of the right  knee Device: Samsung HS60  Verbal informed consent obtained.  Time-out conducted.  Noted no overlying erythema, induration, or other signs of local infection.  Skin prepped in a sterile fashion.  Local anesthesia: Topical Ethyl chloride.  With sterile technique and under real time ultrasound guidance: Mild effusion noted, 1 cc Kenalog 40, 2 cc lidocaine, 2 cc bupivacaine injected easily Completed without difficulty  Advised to call if fevers/chills, erythema, induration, drainage, or persistent bleeding.  Images permanently stored and available for review in PACS.  Impression: Technically successful ultrasound guided injection.  Independent interpretation of notes and tests performed by another provider:   None.  Brief History, Exam, Impression, and Recommendations:    Primary osteoarthritis of right knee Rita Levine returns, she is a very pleasant 71 year old female with a long history of knee osteoarthritis, injected in 2015, and then in May 2023.  She had about 3 months of relief now with recurrence of pain. Repeat injection today but due to short duration of relief we will get Levine approved for viscosupplementation. Return to see me to start Visco if and when needed.  Polyarthralgia Doing a full rheumatoid work-up.    ____________________________________________ Rita Levine. Rita Levine, M.D., ABFM., CAQSM., AME. Primary Care and Sports Medicine [facility]  Adjunct Professor of [place] [employer] [facility] of Medicine  Risk manager

## 2022-08-02 ENCOUNTER — Telehealth: Payer: Self-pay

## 2022-08-02 MED ORDER — CLOBETASOL PROPIONATE 0.05 % EX SOLN: 1.0000 | Freq: Two times a day (BID) | CUTANEOUS | 2 refills | Status: DC

## 2022-08-02 NOTE — Telephone Encounter (Signed)
Patient was seen last week by Dr. Raoul Pitch.  She is requesting refill on shampoo/solution for her hair  Clobetrol.  States that she was speaking with Lorriane Shire about it.   Please call in  - Charles Mix

## 2022-08-02 NOTE — Telephone Encounter (Signed)
Completed.

## 2022-08-05 DIAGNOSIS — M5414 Radiculopathy, thoracic region: Secondary | ICD-10-CM | POA: Diagnosis not present

## 2022-08-06 ENCOUNTER — Encounter: Payer: Self-pay | Admitting: Interventional Cardiology

## 2022-08-06 NOTE — Telephone Encounter (Signed)
MyVisco paperwork faxed to [employer] at [phone] Request is for Orthovisc Pt's insurance prefers Orthovisc Fax confirmation receipt received  

## 2022-08-07 ENCOUNTER — Encounter: Payer: Self-pay | Admitting: Family Medicine

## 2022-08-07 LAB — LUPUS(12) PANEL
Anti Nuclear Antibody (ANA): NEGATIVE
C3 Complement: 164 mg/dL (ref 83–193)
C4 Complement: 34 mg/dL (ref 15–57)
ENA SM Ab Ser-aCnc: <1 AI
Rheumatoid fact SerPl-aCnc: <14 IU/mL (ref <14)
Ribosomal P Protein Ab: <1 AI
SM/RNP: <1 AI
SSA (Ro) (ENA) Antibody, IgG: <1 AI
SSB (La) (ENA) Antibody, IgG: <1 AI
Scleroderma (Scl-70) (ENA) Antibody, IgG: <1 AI
Thyroperoxidase Ab SerPl-aCnc: 1 IU/mL (ref <9)
ds DNA Ab: <1 IU/mL

## 2022-08-07 LAB — CYCLIC CITRUL PEPTIDE ANTIBODY, IGG: Cyclic Citrullin Peptide Ab: <16 UNITS

## 2022-08-07 LAB — RHEUMATOID FACTOR (IGA, IGG, IGM)
Rheumatoid Factor (IgA): <5 U (ref <=6)
Rheumatoid Factor (IgG): <5 U (ref <=6)
Rheumatoid Factor (IgM): <5 U (ref <=6)

## 2022-08-07 LAB — URIC ACID: Uric Acid, Serum: 5.1 mg/dL (ref 2.5–7.0)

## 2022-08-07 LAB — CK: Total CK: 67 U/L (ref 29–143)

## 2022-08-07 NOTE — Telephone Encounter (Signed)
Benefits Investigation Details received from [employer] Injection: Orthovisc  Medical: Deductible does not apply. Once the OOP has been met, pt is covered at 100%.  PA required: Yes PA form and medical records faxed to [facility] at [phone]  Fax confirmation received  Pharmacy: Product not covered under the pharmacy plan.   Specialty Pharmacy: [facility]  May fill through: [facility] and [place] Copay/Coinsurance: $29 Product Copay: 20% ($140) Administration Coinsurance: 20% ($26) Administration Copay:  Deductible:  Out of Pocket Max: $4500 (met: [id number])    Total approximate cost per injection is $191.   Awaiting response regarding PA.

## 2022-08-08 ENCOUNTER — Ambulatory Visit (HOSPITAL_COMMUNITY): Payer: Medicare HMO | Attending: Interventional Cardiology

## 2022-08-08 DIAGNOSIS — R0609 Other forms of dyspnea: Secondary | ICD-10-CM | POA: Diagnosis not present

## 2022-08-08 LAB — ECHOCARDIOGRAM COMPLETE
Area-P 1/2: 2.74 cm2
S' Lateral: 2.1 cm

## 2022-08-08 MED ORDER — PERFLUTREN LIPID MICROSPHERE
1.0000 mL | INTRAVENOUS | Status: AC | PRN
  Administered 2022-08-08: 2 mL via INTRAVENOUS

## 2022-08-09 ENCOUNTER — Encounter: Payer: Self-pay | Admitting: Family Medicine

## 2022-08-09 DIAGNOSIS — R519 Headache, unspecified: Secondary | ICD-10-CM

## 2022-08-09 DIAGNOSIS — H5712 Ocular pain, left eye: Secondary | ICD-10-CM

## 2022-08-12 ENCOUNTER — Encounter: Payer: Self-pay | Admitting: Interventional Cardiology

## 2022-08-14 NOTE — Telephone Encounter (Signed)
Per Zeb Comfort is valid 08/07/22-11/07/22 ([id number])  Per pt, she is not ready to proceed with the injections at this time. She will cal back to schedule when she is ready. She is aware the Josem Kaufmann is good through January.

## 2022-08-15 NOTE — Telephone Encounter (Signed)
Patient has been notified.  See note on echo results

## 2022-08-22 DIAGNOSIS — M5124 Other intervertebral disc displacement, thoracic region: Secondary | ICD-10-CM | POA: Diagnosis not present

## 2022-08-22 IMAGING — CR DG NECK SOFT TISSUE
2 series · 2 of 2 positions shown · non-contrast
Comparison: None.

CLINICAL DATA: COVID.  Chest tightness.  Three tight

EXAM:
NECK SOFT TISSUES - 1+ VIEW

[w soft tissue neck]
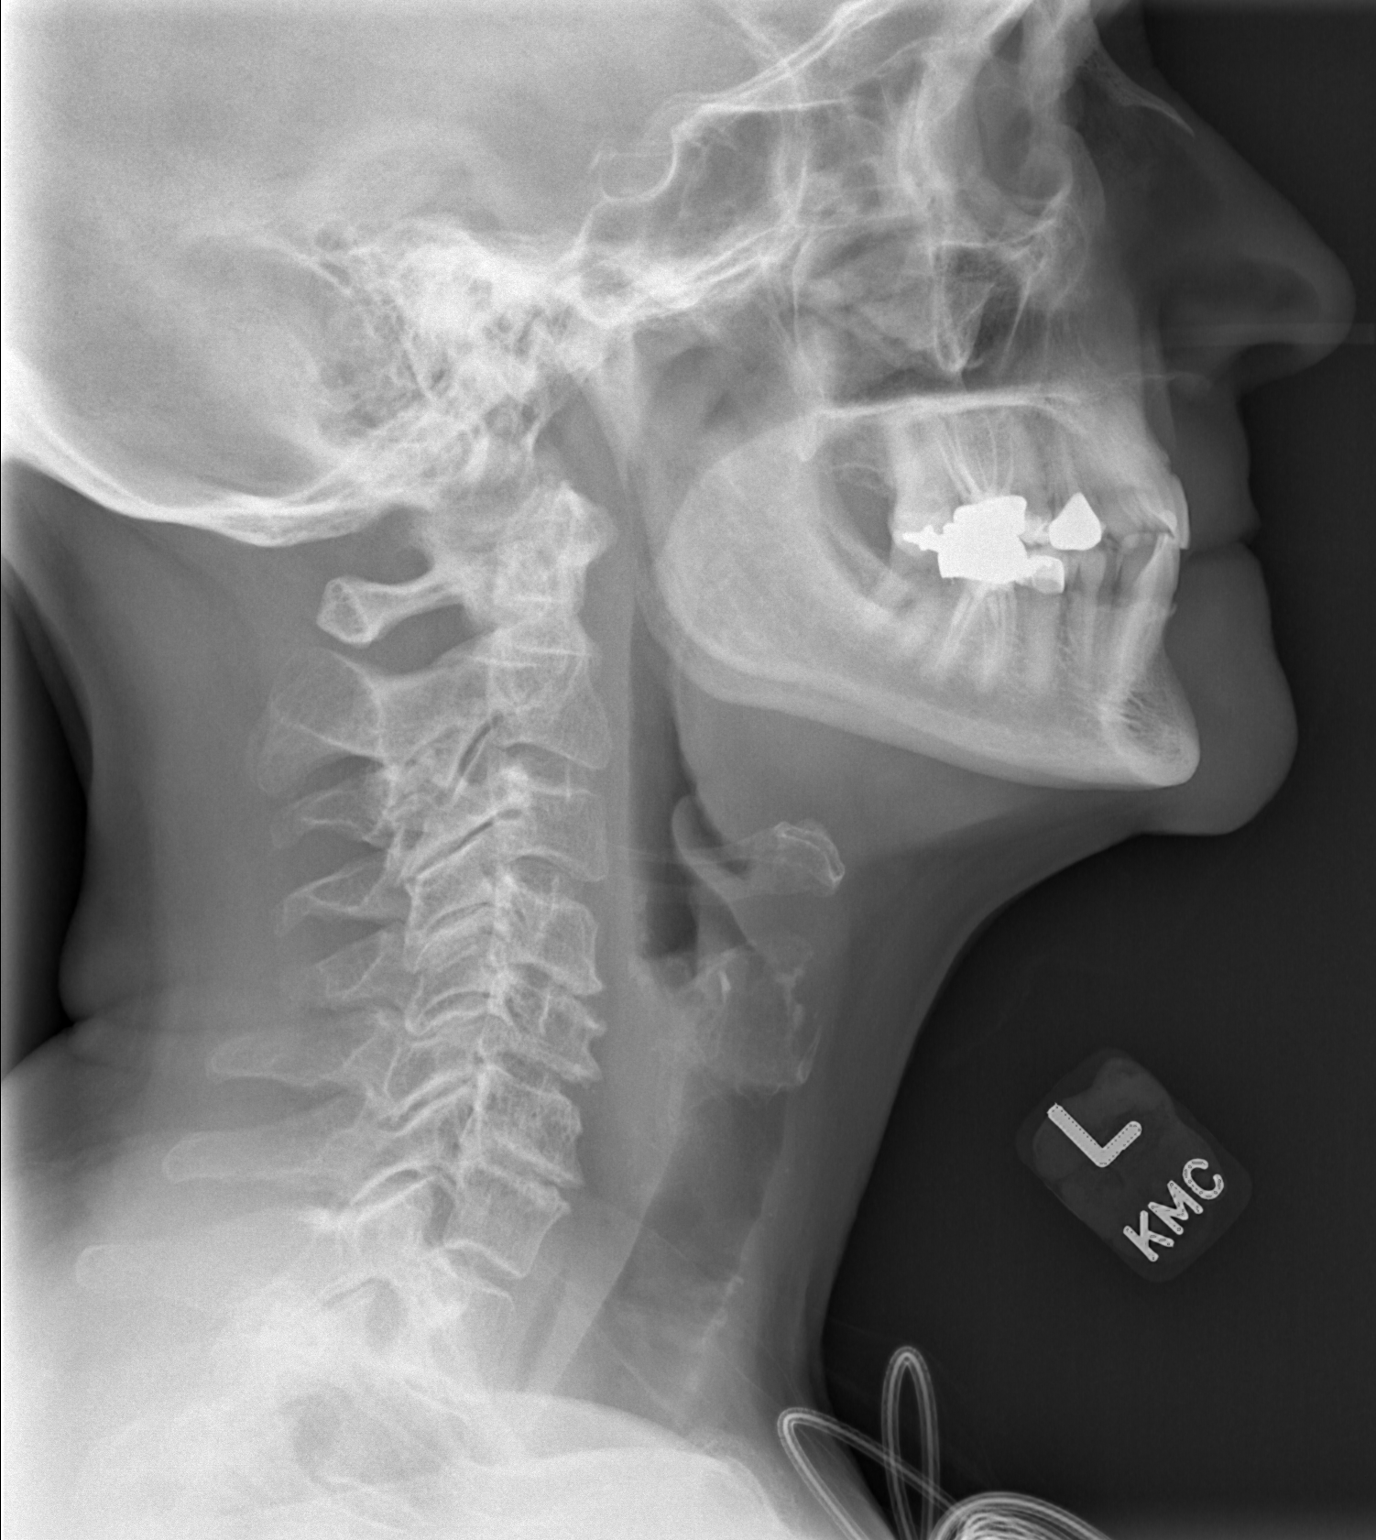

[w soft tissue neck ap]
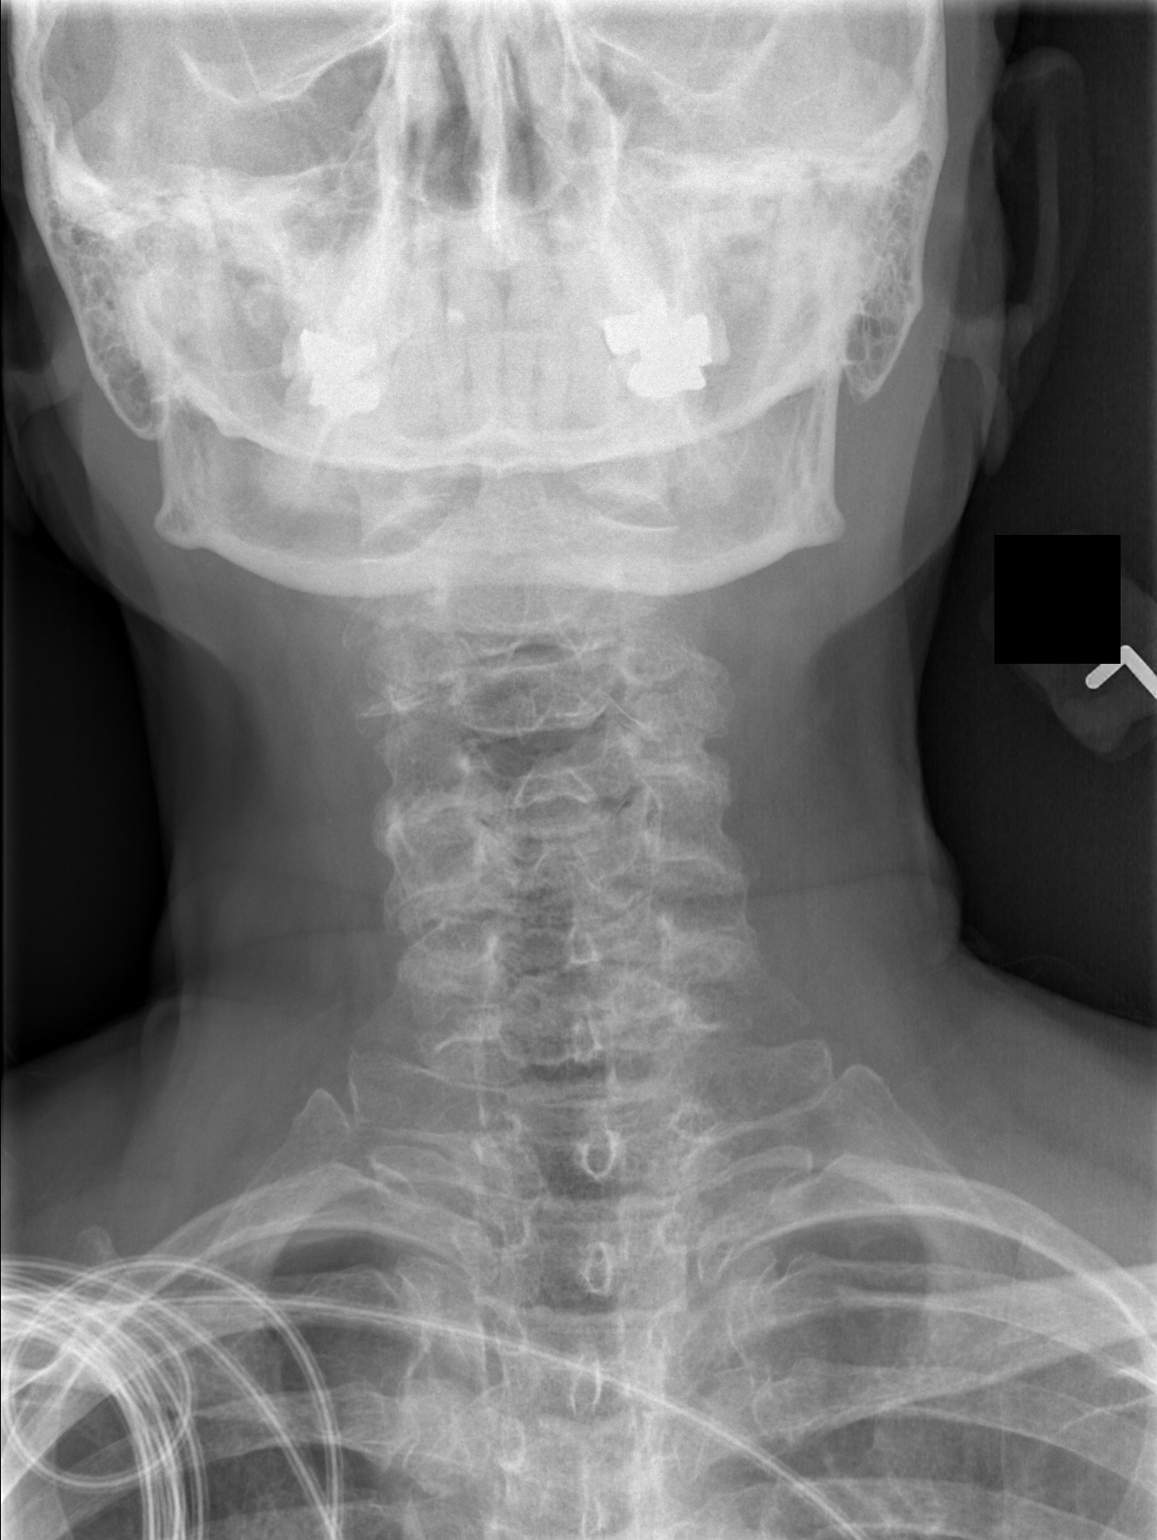

[2 of 2 positions shown; findings below may reference images not displayed]

FINDINGS: Normal hypopharynx. Epiglottis is normal. Larynx is grossly normal.
Proximal trachea is normal. No prevertebral swelling. Degenerative
changes in the cervical spine.
IMPRESSION: No soft tissue abnormality in the neck

## 2022-09-01 ENCOUNTER — Other Ambulatory Visit: Payer: Self-pay | Admitting: Interventional Cardiology

## 2022-09-07 ENCOUNTER — Ambulatory Visit
Admission: RE | Admit: 2022-09-07 | Discharge: 2022-09-07 | Disposition: A | Discharge status: Home / Self Care | Payer: Medicare HMO | Source: Ambulatory Visit | Attending: Family Medicine | Admitting: Family Medicine

## 2022-09-07 DIAGNOSIS — R519 Headache, unspecified: Secondary | ICD-10-CM | POA: Diagnosis not present

## 2022-09-07 DIAGNOSIS — H5712 Ocular pain, left eye: Secondary | ICD-10-CM

## 2022-09-07 MED ORDER — GADOPICLENOL 0.5 MMOL/ML IV SOLN
6.0000 mL | Freq: Once | INTRAVENOUS | Status: AC | PRN
  Administered 2022-09-07: 6 mL via INTRAVENOUS

## 2022-09-10 ENCOUNTER — Telehealth: Payer: Self-pay | Admitting: Family Medicine

## 2022-09-10 NOTE — Telephone Encounter (Signed)
Please call patient: Her MRI did not show evidence of masses/tumors, edema or hemorrhages. Overall, it was a normal MRI with the exception of small scattered areas of "foci ," in the white matter portion of her brain.  The same changes were appreciated in 2008, but has since progressed per the report. This is a sign of early small vessel changes in the brain.  Making sure her hypertension is well-controlled.  Ideally, less than than 130/80 will help slow progression.  Keeping her cholesterol well-controlled, as it is now, also is extremely helpful.  Hopefully, since she discontinue the turmeric and diclofenac, the bruising will improve.  The seventh conjunctiva (eye) broken blood vessel could have been from the turmeric and diclofenac together, but also could be because her blood pressures are higher than desired at times.  Her blood pressures have been borderline in the office on the last couple occasions.  I would recommend she monitor her blood pressure at home and if not routinely below 130/80, then come in for an appointment and we can discuss adding to her blood pressure regimen.  Commend she look over this particular website for education on the small vessel changes.  Keep in mind she has very early stages of this and the symptoms they report-, and the later stages.   [email or web address]  Thank you

## 2022-09-10 NOTE — Telephone Encounter (Signed)
Spoke with patient regarding results/recommendations.  

## 2022-09-10 NOTE — Telephone Encounter (Signed)
LM for pt to return call to discuss.  

## 2022-09-12 ENCOUNTER — Ambulatory Visit: Payer: Medicare HMO | Admitting: Family Medicine

## 2022-09-20 ENCOUNTER — Other Ambulatory Visit: Payer: Self-pay | Admitting: Family Medicine

## 2022-09-24 ENCOUNTER — Other Ambulatory Visit: Payer: Self-pay | Admitting: Family Medicine

## 2022-09-25 ENCOUNTER — Other Ambulatory Visit: Payer: Self-pay

## 2022-09-25 ENCOUNTER — Encounter: Payer: Self-pay | Admitting: Gastroenterology

## 2022-09-25 MED ORDER — ONDANSETRON 8 MG PO TBDP: 8.0000 mg | ORAL_TABLET | Freq: Three times a day (TID) | ORAL | 1 refills | Status: DC | PRN

## 2022-09-25 NOTE — Telephone Encounter (Signed)
Anderson Malta can you please review for this Ardis Hughs pt? You last saw her in March.  Let me know if I need to send to someone else. Thank you

## 2022-09-27 ENCOUNTER — Ambulatory Visit (INDEPENDENT_AMBULATORY_CARE_PROVIDER_SITE_OTHER): Payer: Medicare HMO | Admitting: Sports Medicine

## 2022-09-27 ENCOUNTER — Ambulatory Visit (INDEPENDENT_AMBULATORY_CARE_PROVIDER_SITE_OTHER): Payer: Medicare HMO

## 2022-09-27 DIAGNOSIS — M7552 Bursitis of left shoulder: Secondary | ICD-10-CM | POA: Diagnosis not present

## 2022-09-27 DIAGNOSIS — M65342 Trigger finger, left ring finger: Secondary | ICD-10-CM | POA: Diagnosis not present

## 2022-09-27 MED ORDER — TRIAMCINOLONE ACETONIDE 40 MG/ML IJ SUSP
80.0000 mg | Freq: Once | INTRAMUSCULAR | Status: AC
  Administered 2022-09-27: 80 mg via INTRAMUSCULAR

## 2022-09-27 NOTE — Progress Notes (Signed)
    Procedures performed today:    Procedure: Real-time Ultrasound Guided injection of the left subacromial bursa Device: Samsung HS60  Verbal informed consent obtained.  Time-out conducted.  Noted no overlying erythema, induration, or other signs of local infection.  Skin prepped in a sterile fashion.  Local anesthesia: Topical Ethyl chloride.  With sterile technique and under real time ultrasound guidance: Intact cuff noted, 1 cc Kenalog 40, 1 cc lidocaine, 1 cc bupivacaine injected easily Completed without difficulty  Advised to call if fevers/chills, erythema, induration, drainage, or persistent bleeding.  Images permanently stored and available for review in PACS.  Impression: Technically successful ultrasound guided injection.  Procedure: Real-time Ultrasound Guided injection of the left fourth flexor tendon sheath Device: Samsung HS60  Verbal informed consent obtained.  Time-out conducted.  Noted no overlying erythema, induration, or other signs of local infection.  Skin prepped in a sterile fashion.  Local anesthesia: Topical Ethyl chloride.  With sterile technique and under real time ultrasound guidance: Noted flexor tendon nodule, 1/2 cc lidocaine, 1/2 cc kenalog 40 injected easily. Completed without difficulty  Advised to call if fevers/chills, erythema, induration, drainage, or persistent bleeding.  Images permanently stored and available for review in PACS.  Impression: Technically successful ultrasound guided injection.  Independent interpretation of notes and tests performed by another provider:   None.  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 Repeated today.  Acute shoulder bursitis, left Increasing pain over the left deltoid worse with reaching motions, internal  rotation. She does have a positive empty can sign with weakness. She is desiring aggressive interventional treatment today so we did a subacromial injection, adding shoulder x-rays and rotator cuff conditioning. Return to see me in 6 weeks.    ____________________________________________ Gwen Her. Dianah Field, M.D., ABFM., CAQSM., AME. Primary Care and Sports Medicine [place] [facility]  Adjunct Professor of [place] [employer] [facility] [employer] of Medicine  Risk manager

## 2022-09-27 NOTE — Assessment & Plan Note (Signed)
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 Repeated today.

## 2022-09-27 NOTE — Assessment & Plan Note (Signed)
Increasing pain over the left deltoid worse with reaching motions, internal rotation. She does have a positive empty can sign with weakness. She is desiring aggressive interventional treatment today so we did a subacromial injection, adding shoulder x-rays and rotator cuff conditioning. Return to see me in 6 weeks.

## 2022-10-02 ENCOUNTER — Encounter: Payer: Self-pay | Admitting: Family Medicine

## 2022-10-02 ENCOUNTER — Ambulatory Visit (INDEPENDENT_AMBULATORY_CARE_PROVIDER_SITE_OTHER): Payer: Medicare HMO | Admitting: Family Medicine

## 2022-10-02 VITALS — BP 155/68 | HR 74 | Temp 98.4°F | Ht 59.0 in | Wt 130.0 lb

## 2022-10-02 DIAGNOSIS — G479 Sleep disorder, unspecified: Secondary | ICD-10-CM

## 2022-10-02 DIAGNOSIS — J45909 Unspecified asthma, uncomplicated: Secondary | ICD-10-CM | POA: Diagnosis not present

## 2022-10-02 DIAGNOSIS — R002 Palpitations: Secondary | ICD-10-CM | POA: Diagnosis not present

## 2022-10-02 DIAGNOSIS — K219 Gastro-esophageal reflux disease without esophagitis: Secondary | ICD-10-CM | POA: Diagnosis not present

## 2022-10-02 DIAGNOSIS — M797 Fibromyalgia: Secondary | ICD-10-CM | POA: Diagnosis not present

## 2022-10-02 DIAGNOSIS — M1990 Unspecified osteoarthritis, unspecified site: Secondary | ICD-10-CM | POA: Diagnosis not present

## 2022-10-02 DIAGNOSIS — K588 Other irritable bowel syndrome: Secondary | ICD-10-CM

## 2022-10-02 DIAGNOSIS — I10 Essential (primary) hypertension: Secondary | ICD-10-CM

## 2022-10-02 DIAGNOSIS — E782 Mixed hyperlipidemia: Secondary | ICD-10-CM | POA: Diagnosis not present

## 2022-10-02 DIAGNOSIS — E039 Hypothyroidism, unspecified: Secondary | ICD-10-CM | POA: Diagnosis not present

## 2022-10-02 DIAGNOSIS — I700 Atherosclerosis of aorta: Secondary | ICD-10-CM | POA: Diagnosis not present

## 2022-10-02 DIAGNOSIS — E663 Overweight: Secondary | ICD-10-CM | POA: Diagnosis not present

## 2022-10-02 DIAGNOSIS — M8580 Other specified disorders of bone density and structure, unspecified site: Secondary | ICD-10-CM

## 2022-10-02 MED ORDER — FEXOFENADINE HCL 180 MG PO TABS: 180.0000 mg | ORAL_TABLET | Freq: Every day | ORAL | 1 refills | Status: DC

## 2022-10-02 MED ORDER — PANTOPRAZOLE SODIUM 40 MG PO TBEC: 40.0000 mg | DELAYED_RELEASE_TABLET | Freq: Two times a day (BID) | ORAL | 3 refills | Status: DC

## 2022-10-02 MED ORDER — FLUTICASONE PROPIONATE 50 MCG/ACT NA SUSP: 2.0000 | Freq: Every day | NASAL | 11 refills | Status: DC

## 2022-10-02 MED ORDER — SYMBICORT 80-4.5 MCG/ACT IN AERO: INHALATION_SPRAY | RESPIRATORY_TRACT | 5 refills | Status: DC

## 2022-10-02 MED ORDER — MONTELUKAST SODIUM 10 MG PO TABS: ORAL_TABLET | ORAL | 1 refills | Status: DC

## 2022-10-02 MED ORDER — CLOBETASOL PROPIONATE 0.05 % EX SOLN: 1.0000 | Freq: Two times a day (BID) | CUTANEOUS | 2 refills | Status: DC

## 2022-10-02 MED ORDER — ZOLPIDEM TARTRATE 5 MG PO TABS: 5.0000 mg | ORAL_TABLET | Freq: Every day | ORAL | 1 refills | Status: DC

## 2022-10-02 MED ORDER — VALACYCLOVIR HCL 1 G PO TABS: ORAL_TABLET | ORAL | 1 refills | Status: DC

## 2022-10-02 MED ORDER — HYOSCYAMINE SULFATE 0.125 MG PO TABS: 0.1250 mg | ORAL_TABLET | Freq: Four times a day (QID) | ORAL | 5 refills | Status: DC | PRN

## 2022-10-02 MED ORDER — ALBUTEROL SULFATE (2.5 MG/3ML) 0.083% IN NEBU: 2.5000 mg | INHALATION_SOLUTION | Freq: Four times a day (QID) | RESPIRATORY_TRACT | 0 refills | Status: DC | PRN

## 2022-10-02 MED ORDER — TIZANIDINE HCL 4 MG PO TABS: 4.0000 mg | ORAL_TABLET | Freq: Four times a day (QID) | ORAL | 5 refills | Status: DC | PRN

## 2022-10-02 MED ORDER — ATENOLOL 25 MG PO TABS: 12.5000 mg | ORAL_TABLET | Freq: Every day | ORAL | 1 refills | Status: DC

## 2022-10-02 MED ORDER — HYDROCHLOROTHIAZIDE 12.5 MG PO CAPS: 12.5000 mg | ORAL_CAPSULE | Freq: Every day | ORAL | 1 refills | Status: DC | PRN

## 2022-10-02 MED ORDER — ALBUTEROL SULFATE HFA 108 (90 BASE) MCG/ACT IN AERS: 2.0000 | INHALATION_SPRAY | Freq: Four times a day (QID) | RESPIRATORY_TRACT | 5 refills | Status: DC | PRN

## 2022-10-02 NOTE — Progress Notes (Signed)
Patient ID: [name], female  DOB: 03/31/1951, 71 y.o.   MRN: [id number] Patient Care Team    Relationship Specialty Notifications Start End  Ma Hillock, DO PCP - General Family Medicine  12/01/19   Jettie Booze, MD PCP - Cardiology Cardiology  01/26/20   Milus Banister, MD Attending Physician Gastroenterology  12/01/19   Starlyn Skeans Physician Assistant Dermatology  04/18/22   Laurence Aly, OD  Optometry  07/26/22     Chief Complaint  Patient presents with   Hypertension    [facility]; pt is not fasting    Subjective: Rita Levine is a 71 y.o.  Female  present for Chronic Conditions/illness Management All past medical history, surgical history, allergies, family history, immunizations, medications and social history were updated in the electronic medical record today. All recent labs, ED visits and hospitalizations within the last year were reviewed.   Gastroesophageal reflux disease, unspecified whether esophagitis present Patient reports symptoms are well-controlled on omeprazole and Pepcid.   Essential hypertension/HLD/Statin declined/Palpitations/Obesity (BMI 30-39.9)/statin intolerance Pt reports compliance with atenolol use of Maxide or losartan half tab if needed only.  Blood pressures ranges at home within normal limits. Patient denies chest pain, shortness of breath, dizziness or lower extremity edema.  Pt does not daily baby ASA. Pt is not prescribed statin due to intolerance.   Fibromyalgia/osteoarthritis, unspecified osteoarthritis type, unspecified site/ DDD (degenerative disc disease), lumbar/Spinal stenosis of lumbar region, unspecified whether neurogenic claudication present Patient reports her fibromyalgia and arthritis symptoms are decently controlled on tylenol, tumeric and Zanaflex.    Seasonal allergies/Allergic rhinitis due to pollen, unspecified seasonality/ Multiple food allergies She reports allergies are stable on Xyzal nightly,  Singulair nightly and Allegra as needed in the day. Prior note: Patient reports her allergies have always been rather uncontrolled.  When she lived in a different state she had food allergy testing and was allergic to many foods.  She at one time had allergy shots and this is when her allergies were the best as far as control.  She reports frequent occurrence of sinus infections and sinus headaches.  She had an MRI in 2013.  Her current regimen consist of Xyzal, Allegra, Singulair and Flonase.  She has taking Zyrtec in the past.  She reports the Xyzal was added last year but she does not feel its been helpful.   irritable bowel syndrome She reports an extensive history of diverticulitis requiring colon resection and later exploratory lap of the abdomen with removal of adhesions for short bowel obstruction.  SHe has continued irritable bowel symptoms which are well-controlled on Levsin as needed.  She is established with gastroenterology.  Last colonoscopy 2017, with Dr. Ardis Hughs   Asthma, intrinsic Patient reports her asthma is well-controlled on Symbicort.  She still rarely needs to use her albuterol inhaler.   Hypothyroidism, unspecified type/Thyroid nodule She reports compliance with Armour 45 mg total dose.  Labs are up-to-date.  [facility] due to cost. Prior note: Thyroid nodule history.  Last ultrasound 08/17/2018 with left thyroid nodule.  Per radiology report 1 year follow-up was recommended.  Patient does endorse mild compression-like symptoms.  She states her prior PCP had ordered follow-up, but it was canceled secondary to Covid pandemic.  Patient had thyroid ultrasound completed at [facility] radiology in [place].     10/02/2022    8:21 AM 06/27/2022    1:17 PM 04/24/2022    8:50 AM  11/06/2021   11:04 AM 06/12/2021    6:06 PM  Depression screen PHQ 2/9  Decreased Interest 0 0 0 0 0  Down, Depressed, Hopeless 0 0 0 0 0  PHQ - 2  Score 0 0 0 0 0  Altered sleeping   0 0   Tired, decreased energy   3 1   Change in appetite   1 0   Feeling bad or failure about yourself    0 0   Trouble concentrating   1 0   Moving slowly or fidgety/restless   0 0   Suicidal thoughts   0 0   PHQ-9 Score   5 1     Immunization History  Administered Date(s) Administered   Influenza-Unspecified 06/11/2019, 07/14/2020   Pneumococcal Conjugate-13 07/03/2016   Pneumococcal Polysaccharide-23 08/03/2019   Tdap 05/02/2014   Zoster, Live 10/14/2010    Past Medical History:  Diagnosis Date   Allergic rhinitis    Allergic rhinitis due to pollen 12/03/2019   Allergy    Anemia    Asthma    Cataract    Chest pain, unspecified 04/07/2014   Chicken pox    Colon polyp    Diverticulosis    Dysphagia    Dysrhythmia    Palpitations   Fibromyalgia    Food allergy    Gallstone    GERD (gastroesophageal reflux disease)    Hiatal hernia    Hypercholesteremia    Hypertension    Hypothyroidism    IBS (irritable bowel syndrome)    Ingrown toenail 08/23/2020   Migraine headache    occasionally   [place]orton neuroma, 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   Osteoarthritis    Osteopenia    Pneumonia    PONV (postoperative nausea and vomiting)    Pulmonary hypertension ([place])    pt denies, pt states she was tested for it but was not diagnosed with it   Rectal bleeding    SBO (small bowel obstruction) ([place]) 10/20/2017   Seasonal allergies    Subconjunctival hemorrhage of left eye 08/01/2022   Thyroid nodule    Trigger finger of left and right ring fingers 01/11/2021   Trigger finger of left hand 01/11/2021   Allergies  Allergen Reactions   Sulfamethoxazole-Trimethoprim Hives   Avelox [Moxifloxacin Hcl In Nacl]     Racing heart   Cephalexin Hives   Codeine Hives    Heart racing   Cymbalta [Duloxetine Hcl]    Erythromycin Hives   Flagyl [Metronidazole]     Unknown reaction   Paxlovid [Nirmatrelvir-Ritonavir]    Propranolol Hives   Sulfa Antibiotics  Hives   Tetanus Toxoid Swelling    Reports a fever and headache with swelling.    Tramadol Hives   Statins Other (See Comments)    Myalgia    Past Surgical History:  Procedure Laterality Date   APPENDECTOMY  1982   BREAST EXCISIONAL BIOPSY Right 1995   benign   BREAST LUMPECTOMY  1991   CARPAL TUNNEL RELEASE Right 2006   Right wrist   CESAREAN SECTION     x 2   CHOLECYSTECTOMY N/A 07/25/2020   Procedure: LAPAROSCOPIC CHOLECYSTECTOMY;  Surgeon: [name], MD;  Location: [facility];  Service: General;  Laterality: N/A;   [place] ([place] HX)  01/03/2020   IMAGE MRI brain:  04/2012   No focal IAC or inner ear lesion to explain hearing loss. Slightly greater than expected number of  subcortical T2- hyperintensities bilaterally.  These are nonspecific.  They can be seen in the setting of chronic migraine headaches, demyelinating process, chronic microvascular ischemic, prior infection or inflammation, or vasculitis   IMAGE MRI lumbar:  05/01/2006   Mild central canal stenosis with facet arthropathy and mildly bulging disc at L4-5.  There is mild narrowing in the left lateral recess at this level.  No definite neural impingement.  Appearance at this level has not markedly Moderately severe facet arthropathy at L5-S1 with mild interval progression of a diffuse broad based disc bulge.  There is mild biforaminal narrowing.  Central canal is open   LAPAROSCOPIC ABDOMINAL EXPLORATION  2019   adhesion removal> caused bowel blockage    NASAL SEPTUM SURGERY  2004   TONSILLECTOMY  1965   TOTAL ABDOMINAL HYSTERECTOMY W/ BILATERAL SALPINGOOPHORECTOMY  1988   TUBAL LIGATION  1974   UPPER GASTROINTESTINAL ENDOSCOPY  2020   Family History  Problem Relation Age of Onset   Hyperlipidemia Father    Hypertension Father    Arthritis Father    Diabetes Father    Asthma Father    COPD Father    Early death Father    Parkinson's disease Father     Hypertension Mother    Hyperlipidemia Mother    Macular degeneration Mother    Arthritis Mother    Hearing loss Mother    Heart disease Mother    Kidney disease Mother    Throat cancer Brother        lung, and tongue   Arthritis Brother    COPD Brother    Diabetes type II Sister    COPD Sister    Heart disease Sister    Hypertension Sister    Thyroid cancer Sister    Lung cancer Sister    Early death Sister    COPD Sister    Breast cancer Maternal Aunt    Lung cancer Other        uncle   Emphysema Maternal Aunt    Emphysema Maternal Uncle    Clotting disorder Sister    Brain cancer Sister        brain tumor- not malignant   Breast cancer Cousin    Cancer Sister    Alcohol abuse Sister    COPD Sister    Early death Sister    Alcohol abuse Brother    Arthritis Brother    Depression Brother    Diabetes Brother    Hypercholesterolemia Brother    Colon cancer Neg Hx    Esophageal cancer Neg Hx    Rectal cancer Neg Hx    Stomach cancer Neg Hx    Social History   Social History Narrative   Marital status/children/pets: married, 2 children   Education/employment: HS grad. retired   Engineer, materials:      -smoke alarm in the home:Yes     - wears seatbelt: Yes     - Feels safe in their relationships: Yes    Allergies as of 10/02/2022       Reactions   Sulfamethoxazole-trimethoprim Hives   Avelox [moxifloxacin Hcl In Nacl]    Racing heart   Cephalexin Hives   Codeine Hives   Heart racing   Cymbalta [duloxetine Hcl]    Erythromycin Hives   Flagyl [metronidazole]    Unknown reaction   Paxlovid [nirmatrelvir-ritonavir]    Propranolol Hives   Sulfa Antibiotics Hives   Tetanus Toxoid Swelling   Reports a fever and headache with swelling.  Tramadol Hives   Statins Other (See Comments)   Myalgia        Medication List        Accurate as of October 02, 2022 11:30 AM. If you have any questions, ask your nurse or doctor.          STOP taking these  medications    diclofenac 75 MG EC tablet Commonly known as: VOLTAREN Stopped by: Howard Pouch, DO   Sodium Fluoride 5000 PPM 1.1 % Pste Generic drug: Sodium Fluoride Stopped by: Howard Pouch, DO       TAKE these medications    albuterol 108 (90 Base) MCG/ACT inhaler Commonly known as: ProAir HFA Inhale 2 puffs into the lungs every 6 (six) hours as needed.   albuterol (2.5 MG/3ML) 0.083% nebulizer solution Commonly known as: PROVENTIL Take 3 mLs (2.5 mg total) by nebulization every 6 (six) hours as needed.   Armour Thyroid 15 MG tablet Generic drug: thyroid Take 1 tablet by mouth in the AM Once a day   Armour Thyroid 30 MG tablet Generic drug: thyroid Take 1 tablet by mouth in the AM once a day   atenolol 25 MG tablet Commonly known as: TENORMIN Take 0.5-1 tablets (12.5-25 mg total) by mouth daily.   calcium carbonate 500 MG chewable tablet Commonly known as: TUMS - dosed in mg elemental calcium Chew 3 tablets by mouth daily as needed for indigestion or heartburn.   clobetasol 0.05 % external solution Commonly known as: TEMOVATE Apply 1 Application topically 2 (two) times daily.   clotrimazole 10 MG troche Commonly known as: MYCELEX Take 1 tablet (10 mg total) by mouth 4 (four) times daily as needed.   CoQ-10 100 MG Caps Take 100 mg by mouth daily.   ezetimibe 10 MG tablet Commonly known as: ZETIA Take 1 tablet (10 mg total) by mouth daily.   famotidine 40 MG tablet Commonly known as: PEPCID TAKE 1 TABLET BY MOUTH TWICE A DAY   fexofenadine 180 MG tablet Commonly known as: Allergy Relief Take 1 tablet (180 mg total) by mouth daily.   fluconazole 100 MG tablet Commonly known as: DIFLUCAN Take 100 mg by mouth daily.   fluticasone 50 MCG/ACT nasal spray Commonly known as: FLONASE Place 2 sprays into both nostrils daily.   folic acid 1 MG tablet Commonly known as: FOLVITE SMARTSIG:2 Tablet(s) By Mouth   hydrochlorothiazide 12.5 MG  capsule Commonly known as: MICROZIDE Take 1 capsule (12.5 mg total) by mouth daily as needed.   hyoscyamine 0.125 MG tablet Commonly known as: LEVSIN Take 1 tablet (0.125 mg total) by mouth every 6 (six) hours as needed.   mometasone 0.1 % lotion Commonly known as: ELOCON Apply 1 application topically daily as needed (ear irritation).   montelukast 10 MG tablet Commonly known as: SINGULAIR TAKE 1 TABLET BY MOUTH EVERYDAY AT BEDTIME   mupirocin ointment 2 % Commonly known as: BACTROBAN Apply 1 Application topically 2 (two) times daily.   ondansetron 8 MG disintegrating tablet Commonly known as: ZOFRAN-ODT Take 1 tablet (8 mg total) by mouth every 8 (eight) hours as needed for nausea or vomiting.   pantoprazole 40 MG tablet Commonly known as: PROTONIX Take 1 tablet (40 mg total) by mouth 2 (two) times daily.   Probiotic Caps Take 1 capsule by mouth daily.   rosuvastatin 10 MG tablet Commonly known as: CRESTOR TAKE 1 TABLET BY MOUTH THREE TIMES A WEEK   Symbicort 80-4.5 MCG/ACT inhaler Generic drug: budesonide-formoterol INHALE TWO PUFFS  INTO THE LUNGS IN THE MORNING AND AT BEDTIME   THERATEARS OP Place 1 drop into both eyes 2 (two) times daily.   tiZANidine 4 MG tablet Commonly known as: ZANAFLEX Take 1 tablet (4 mg total) by mouth every 6 (six) hours as needed for muscle spasms.   valACYclovir 1000 MG tablet Commonly known as: VALTREX 2 tabs at onset, rpt 2 tabs in 12 hours once.   VITAMIN A PO Take 1 tablet by mouth daily.   vitamin C 1000 MG tablet Take 3,000 mg by mouth daily.   Vitamin D 125 MCG (5000 UT) Caps Take 5,000 Units by mouth daily.   zinc gluconate 50 MG tablet Take 100 mg by mouth daily.   zolpidem 5 MG tablet Commonly known as: AMBIEN Take 1 tablet (5 mg total) by mouth at bedtime. As needed        All past medical history, surgical history, allergies, family history, immunizations andmedications were updated in the EMR today and  reviewed under the history and medication portions of their EMR.       ROS: 14 pt review of systems performed and negative (unless mentioned in an HPI)  Objective: BP (!) 155/68   Pulse 74   Temp 98.4 F (36.9 C) (Oral)   Ht '4\' 11"'$  (1.499 m)   Wt 130 lb (59 kg)   SpO2 99%   BMI 26.26 kg/m  Physical Exam Vitals and nursing note reviewed.  Constitutional:      General: She is not in acute distress.    Appearance: Normal appearance. She is not ill-appearing, toxic-appearing or diaphoretic.  HENT:     Head: Normocephalic and atraumatic.  Eyes:     General: No scleral icterus.       Right eye: No discharge.        Left eye: No discharge.     Extraocular Movements: Extraocular movements intact.     Conjunctiva/sclera: Conjunctivae normal.     Pupils: Pupils are equal, round, and reactive to light.  Cardiovascular:     Rate and Rhythm: Normal rate and regular rhythm.  Pulmonary:     Effort: Pulmonary effort is normal. No respiratory distress.     Breath sounds: Normal breath sounds. No wheezing, rhonchi or rales.  Musculoskeletal:     Cervical back: Neck supple. No tenderness.     Right lower leg: No edema.     Left lower leg: No edema.  Lymphadenopathy:     Cervical: No cervical adenopathy.  Skin:    General: Skin is warm and dry.     Coloration: Skin is not jaundiced or pale.     Findings: No erythema or rash.  Neurological:     Mental Status: She is alert and oriented to person, place, and time. Mental status is at baseline.     Motor: No weakness.     Gait: Gait normal.  Psychiatric:        Mood and Affect: Mood normal.        Behavior: Behavior normal.        Thought Content: Thought content normal.        Judgment: Judgment normal.     No results found.  Assessment/plan: JALONI SORBER is a 71 y.o. female present for Chronic Conditions/illness Management Gastroesophageal reflux disease, unspecified whether esophagitis present Stable Continue  Protonix. Continue Pepcid.   Essential hypertension/HLD/Statin declined/Palpitations/Obesity (BMI 30-39.9)-long-term current use of medication Stable- today she is grieving the loss of her sister. Home pressures  overall are normal.  Continue Tenormin 12.5 mg qhs Continue Microzide 12.5 mg daily PRN if notable fluid or  losartan 1/2 tab. Only if pressures are elevated > 315 systolic.  Continue low dose statin twice a week supplied by Dr. Scarlette Calico.   Low-sodium diet.  Routine exercise. Labs due next visit   Osteoarthritis, unspecified osteoarthritis type, unspecified site/ DDD (degenerative disc disease), lumbar/Spinal stenosis of lumbar region, unspecified whether neurogenic claudication present/Fibromyalgia Stable Continue Zanaflex   Seasonal allergies/Allergic rhinitis due to pollen, unspecified seasonality/ Multiple food allergies Continue allergy med of choice.  Vistaril  Tired & not helpful.  Continue Singulair 10 mg nightly Continue flonase  Sleep disturbance: Stable Continue Ambien prn Tired vistaril- not helpful. Could consider trazodone in the future if willing.  nccs database reviewed today   irritable bowel syndrome Stable Continue probiotic Continue Levsin 0.125 mg every 6 hours as needed  Asthma, intrinsic Stable Continue Symbicort. Continue albuterol as needed Continue antihistamine regimen   Hypothyroidism, unspecified type/Thyroid nodule Stable Continue Armour Thyroid total dose 45 mg daily (30/15).  This is the only medicine called [facility]. Labs up-to-date, due next visit Osteopenia, unspecified location Labs up-to-date due next visit -UTD 12/2020-DEXA   Return in about 24 weeks (around 03/19/2023).    No orders of the defined types were placed in this encounter.   Meds ordered this encounter  Medications   albuterol (PROAIR HFA) 108 (90 Base) MCG/ACT inhaler    Sig: Inhale 2 puffs into the lungs every 6 (six) hours as  needed.    Dispense:  6.7 g    Refill:  5   albuterol (PROVENTIL) (2.5 MG/3ML) 0.083% nebulizer solution    Sig: Take 3 mLs (2.5 mg total) by nebulization every 6 (six) hours as needed.    Dispense:  75 mL    Refill:  0   atenolol (TENORMIN) 25 MG tablet    Sig: Take 0.5-1 tablets (12.5-25 mg total) by mouth daily.    Dispense:  90 tablet    Refill:  1   clobetasol (TEMOVATE) 0.05 % external solution    Sig: Apply 1 Application topically 2 (two) times daily.    Dispense:  50 mL    Refill:  2   fexofenadine (ALLERGY RELIEF) 180 MG tablet    Sig: Take 1 tablet (180 mg total) by mouth daily.    Dispense:  90 tablet    Refill:  1   fluticasone (FLONASE) 50 MCG/ACT nasal spray    Sig: Place 2 sprays into both nostrils daily.    Dispense:  16 mL    Refill:  11   hydrochlorothiazide (MICROZIDE) 12.5 MG capsule    Sig: Take 1 capsule (12.5 mg total) by mouth daily as needed.    Dispense:  90 capsule    Refill:  1   hyoscyamine (LEVSIN) 0.125 MG tablet    Sig: Take 1 tablet (0.125 mg total) by mouth every 6 (six) hours as needed.    Dispense:  120 tablet    Refill:  5   montelukast (SINGULAIR) 10 MG tablet    Sig: TAKE 1 TABLET BY MOUTH EVERYDAY AT BEDTIME    Dispense:  90 tablet    Refill:  1   pantoprazole (PROTONIX) 40 MG tablet    Sig: Take 1 tablet (40 mg total) by mouth 2 (two) times daily.    Dispense:  180 tablet    Refill:  3   SYMBICORT 80-4.5 MCG/ACT inhaler  Sig: INHALE TWO PUFFS INTO THE LUNGS IN THE MORNING AND AT BEDTIME    Dispense:  30.6 each    Refill:  5   tiZANidine (ZANAFLEX) 4 MG tablet    Sig: Take 1 tablet (4 mg total) by mouth every 6 (six) hours as needed for muscle spasms.    Dispense:  120 tablet    Refill:  5   valACYclovir (VALTREX) 1000 MG tablet    Sig: 2 tabs at onset, rpt 2 tabs in 12 hours once.    Dispense:  20 tablet    Refill:  1   zolpidem (AMBIEN) 5 MG tablet    Sig: Take 1 tablet (5 mg total) by mouth at bedtime. As needed     Dispense:  90 tablet    Refill:  1    Referral Orders  No referral(s) requested today      Electronically signed by: Howard Pouch, [place]

## 2022-10-02 NOTE — Patient Instructions (Signed)
Return in about 24 weeks (around 03/19/2023).        Great to see you today.  I have refilled the medication(s) we provide.   If labs were collected, we will inform you of lab results once received either by echart message or telephone call.   - echart message- for normal results that have been seen by the patient already.   - telephone call: abnormal results or if patient has not viewed results in their echart.

## 2022-10-15 DIAGNOSIS — D2239 Melanocytic nevi of other parts of face: Secondary | ICD-10-CM | POA: Diagnosis not present

## 2022-10-15 DIAGNOSIS — D1801 Hemangioma of skin and subcutaneous tissue: Secondary | ICD-10-CM | POA: Diagnosis not present

## 2022-10-15 DIAGNOSIS — L738 Other specified follicular disorders: Secondary | ICD-10-CM | POA: Diagnosis not present

## 2022-10-15 DIAGNOSIS — Z85820 Personal history of malignant melanoma of skin: Secondary | ICD-10-CM | POA: Diagnosis not present

## 2022-10-15 DIAGNOSIS — D2261 Melanocytic nevi of right upper limb, including shoulder: Secondary | ICD-10-CM | POA: Diagnosis not present

## 2022-10-15 DIAGNOSIS — Z85828 Personal history of other malignant neoplasm of skin: Secondary | ICD-10-CM | POA: Diagnosis not present

## 2022-10-15 DIAGNOSIS — L918 Other hypertrophic disorders of the skin: Secondary | ICD-10-CM | POA: Diagnosis not present

## 2022-10-15 DIAGNOSIS — D0472 Carcinoma in situ of skin of left lower limb, including hip: Secondary | ICD-10-CM | POA: Diagnosis not present

## 2022-10-15 DIAGNOSIS — L438 Other lichen planus: Secondary | ICD-10-CM | POA: Diagnosis not present

## 2022-10-15 DIAGNOSIS — L821 Other seborrheic keratosis: Secondary | ICD-10-CM | POA: Diagnosis not present

## 2022-10-20 ENCOUNTER — Other Ambulatory Visit: Payer: Self-pay | Admitting: Family Medicine

## 2022-10-29 ENCOUNTER — Encounter: Payer: Self-pay | Admitting: Physician Assistant

## 2022-10-29 ENCOUNTER — Ambulatory Visit: Payer: Medicare HMO | Admitting: Physician Assistant

## 2022-10-29 VITALS — BP 118/68 | HR 87 | Ht 59.0 in | Wt 136.0 lb

## 2022-10-29 DIAGNOSIS — K219 Gastro-esophageal reflux disease without esophagitis: Secondary | ICD-10-CM

## 2022-10-29 DIAGNOSIS — R110 Nausea: Secondary | ICD-10-CM

## 2022-10-29 DIAGNOSIS — K589 Irritable bowel syndrome without diarrhea: Secondary | ICD-10-CM

## 2022-10-29 DIAGNOSIS — K224 Dyskinesia of esophagus: Secondary | ICD-10-CM

## 2022-10-29 MED ORDER — ONDANSETRON 4 MG PO TBDP: ORAL_TABLET | ORAL | 3 refills | Status: DC

## 2022-10-29 NOTE — Patient Instructions (Signed)
We have sent the following medications to your pharmacy for you to pick up at your convenience:  Zofran   Follow up as needed  If your blood pressure at your visit was 140/90 or greater, please contact your primary care physician to follow up on this.  _______________________________________________________  If you are age 73 or older, your body mass index should be between 23-30. Your Body mass index is 27.47 kg/m. If this is out of the aforementioned range listed, please consider follow up with your Primary Care Provider.  If you are age 69 or younger, your body mass index should be between 19-25. Your Body mass index is 27.47 kg/m. If this is out of the aformentioned range listed, please consider follow up with your Primary Care Provider.   ________________________________________________________  The [facility] providers would like to encourage you to use [facility] to communicate with providers for non-urgent requests or questions.  Due to long hold times on the telephone, sending your provider a message by [facility] may be a faster and more efficient way to get a response.  Please allow 48 business hours for a response.  Please remember that this is for non-urgent requests.  _______________________________________________________   Thank you for entrusting me with your care and choosing [facility].  Ellouise Newer PA-C

## 2022-10-29 NOTE — Progress Notes (Signed)
Chief Complaint: Refills of Zofran  HPI:    Rita Levine is a 72 year old female with a past medical history as listed below including IBS, GERD and fibromyalgia, known to Dr. Ardis Hughs, who presents to clinic today to discuss refills for her Zofran.   08/05/2016 colonoscopy with diverticulosis and hemorrhoids and otherwise normal.  Repeat recommended 10 years.    12/08/2018 EGD normal.    07/04/2021 patient seen in clinic for generalized abdominal complaints.  At that time suspected IBS with recent flare of symptoms due to COVID.  She was started on MiraLAX and Benefiber.  Also increase Pantoprazole to twice daily and added Pepcid at bedtime.    10/25/2021 CBC normal.    12/14/2021 ultrasound soft tissue head and neck showed an area of palpable complaint correlates with the submandibular glands which are symmetric and unremarkable.    12/19/2021 patient seen in clinic by me with trouble swallowing and a cough with hoarseness.  At that time patient increased Famotidine to 40 mg twice a day and continued on Pantoprazole 40 twice daily.  She was scheduled for a barium swallow and possible EGD.    12/31/2021 esophagram with numerous surgery contractions present with gravity-dependent swallows were less apparent during prone RAO swallow suggestive of atypical greater radiation of dysmotility.  Esophageal manometry was recommended.  Also recommended she trial of peppermint oil 3 times daily with meals, 2 Altoid's with every meal.    02/05/2022 EGD with white esophageal plaques found consistent with candidiasis and small hiatal hernia.  Patient started on Diflucan.    09/25/2022 patient requested a refill of Zofran.  She was given some but told to come in for an appointment.    Today, the patient tells me that she always has some Zofran 4 mg on hand.  About 1 day a month or sometimes slightly more frequently she will get spasms of her lower abdomen or pain which makes her nauseous and she will use the Zofran then.   She just likes to have some on hand.  Other than this continues with some generalized abdominal pain which she thinks is from all of her surgeries in the past sometimes helps with a heating pad.  Also continues with chronic constipation for which she uses a laxative very occasionally.  Also patient tells me that her swallowing got much better after she was treated for yeast infection in her throat so she never went through with the esophageal manometry and would like to avoid it if possible after hearing about it.    Denies fever, chills or weight loss.  Past Medical History:  Diagnosis Date   Allergic rhinitis    Allergic rhinitis due to pollen 12/03/2019   Allergy    Anemia    Asthma    Cataract    Chest pain, unspecified 04/07/2014   Chicken pox    Colon polyp    Diverticulosis    Dysphagia    Dysrhythmia    Palpitations   Fibromyalgia    Food allergy    Gallstone    GERD (gastroesophageal reflux disease)    Hiatal hernia    Hypercholesteremia    Hypertension    Hypothyroidism    IBS (irritable bowel syndrome)    Ingrown toenail 08/23/2020   Migraine headache    occasionally   Morton neuroma, 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   Osteoarthritis    Osteopenia    Pneumonia    PONV (postoperative nausea and vomiting)    Pulmonary hypertension ([place])  pt denies, pt states she was tested for it but was not diagnosed with it   Rectal bleeding    SBO (small bowel obstruction) ([place]) 10/20/2017   Seasonal allergies    Subconjunctival hemorrhage of left eye 08/01/2022   Thyroid nodule    Trigger finger of left and right ring fingers 01/11/2021   Trigger finger of left hand 01/11/2021    Past Surgical History:  Procedure Laterality Date   APPENDECTOMY  1982   BREAST EXCISIONAL BIOPSY Right 1995   benign   BREAST LUMPECTOMY  1991   CARPAL TUNNEL RELEASE Right 2006   Right wrist   CESAREAN SECTION     x 2   CHOLECYSTECTOMY N/A 07/25/2020   Procedure: LAPAROSCOPIC CHOLECYSTECTOMY;   Surgeon: [name], MD;  Location: [facility];  Service: General;  Laterality: N/A;   [place] ([place] HX)  01/03/2020   IMAGE MRI brain:  04/2012   No focal IAC or inner ear lesion to explain hearing loss. Slightly greater than expected number of subcortical T2- hyperintensities bilaterally.  These are nonspecific.  They can be seen in the setting of chronic migraine headaches, demyelinating process, chronic microvascular ischemic, prior infection or inflammation, or vasculitis   IMAGE MRI lumbar:  05/01/2006   Mild central canal stenosis with facet arthropathy and mildly bulging disc at L4-5.  There is mild narrowing in the left lateral recess at this level.  No definite neural impingement.  Appearance at this level has not markedly Moderately severe facet arthropathy at L5-S1 with mild interval progression of a diffuse broad based disc bulge.  There is mild biforaminal narrowing.  Central canal is open   LAPAROSCOPIC ABDOMINAL EXPLORATION  2019   adhesion removal> caused bowel blockage    NASAL SEPTUM SURGERY  2004   TONSILLECTOMY  1965   TOTAL ABDOMINAL HYSTERECTOMY W/ BILATERAL SALPINGOOPHORECTOMY  1988   TUBAL LIGATION  1974   UPPER GASTROINTESTINAL ENDOSCOPY  2020    Current Outpatient Medications  Medication Sig Dispense Refill   albuterol (PROAIR HFA) 108 (90 Base) MCG/ACT inhaler Inhale 2 puffs into the lungs every 6 (six) hours as needed. 6.7 g 5   albuterol (PROVENTIL) (2.5 MG/3ML) 0.083% nebulizer solution Take 3 mLs (2.5 mg total) by nebulization every 6 (six) hours as needed. 75 mL 0   ARMOUR THYROID 15 MG tablet Take 1 tablet by mouth in the AM Once a day 90 tablet 3   ARMOUR THYROID 30 MG tablet Take 1 tablet by mouth in the AM once a day 90 tablet 3   Ascorbic Acid (VITAMIN C) 1000 MG tablet Take 3,000 mg by mouth daily.     atenolol (TENORMIN) 25 MG tablet Take 0.5-1 tablets (12.5-25 mg total) by mouth daily. 90 tablet 1    calcium carbonate (TUMS - DOSED IN MG ELEMENTAL CALCIUM) 500 MG chewable tablet Chew 3 tablets by mouth daily as needed for indigestion or heartburn.     Carboxymethylcellulose Sodium (THERATEARS OP) Place 1 drop into both eyes 2 (two) times daily.     Cholecalciferol (VITAMIN D) 125 MCG (5000 UT) CAPS Take 5,000 Units by mouth daily.     clobetasol (TEMOVATE) 0.05 % external solution Apply 1 Application topically 2 (two) times daily. 50 mL 2   clotrimazole (MYCELEX) 10 MG troche Take 1 tablet (10 mg total) by mouth 4 (four) times daily as needed. 24 tablet 3   Coenzyme Q10 (  COQ-10) 100 MG CAPS Take 100 mg by mouth daily.     ezetimibe (ZETIA) 10 MG tablet Take 1 tablet (10 mg total) by mouth daily. 90 tablet 3   famotidine (PEPCID) 40 MG tablet TAKE 1 TABLET BY MOUTH TWICE A DAY 180 tablet 1   fexofenadine (ALLERGY RELIEF) 180 MG tablet Take 1 tablet (180 mg total) by mouth daily. 90 tablet 1   fluconazole (DIFLUCAN) 100 MG tablet Take 100 mg by mouth daily.     fluticasone (FLONASE) 50 MCG/ACT nasal spray Place 2 sprays into both nostrils daily. 16 mL 11   folic acid (FOLVITE) 1 MG tablet SMARTSIG:2 Tablet(s) By Mouth     hydrochlorothiazide (MICROZIDE) 12.5 MG capsule Take 1 capsule (12.5 mg total) by mouth daily as needed. 90 capsule 1   hyoscyamine (LEVSIN) 0.125 MG tablet Take 1 tablet (0.125 mg total) by mouth every 6 (six) hours as needed. 120 tablet 5   mometasone (ELOCON) 0.1 % lotion Apply 1 application topically daily as needed (ear irritation).      montelukast (SINGULAIR) 10 MG tablet TAKE 1 TABLET BY MOUTH EVERYDAY AT BEDTIME 90 tablet 1   mupirocin ointment (BACTROBAN) 2 % Apply 1 Application topically 2 (two) times daily. 30 g 2   ondansetron (ZOFRAN-ODT) 8 MG disintegrating tablet Take 1 tablet (8 mg total) by mouth every 8 (eight) hours as needed for nausea or vomiting. 30 tablet 1   pantoprazole (PROTONIX) 40 MG tablet Take 1 tablet (40 mg total) by mouth 2 (two) times daily.  180 tablet 3   Probiotic CAPS Take 1 capsule by mouth daily.     rosuvastatin (CRESTOR) 10 MG tablet TAKE 1 TABLET BY MOUTH THREE TIMES A WEEK 36 tablet 2   SYMBICORT 80-4.5 MCG/ACT inhaler INHALE TWO PUFFS INTO THE LUNGS IN THE MORNING AND AT BEDTIME 30.6 each 5   tiZANidine (ZANAFLEX) 4 MG tablet Take 1 tablet (4 mg total) by mouth every 6 (six) hours as needed for muscle spasms. 120 tablet 5   valACYclovir (VALTREX) 1000 MG tablet 2 tabs at onset, rpt 2 tabs in 12 hours once. 20 tablet 1   VITAMIN A PO Take 1 tablet by mouth daily.     zinc gluconate 50 MG tablet Take 100 mg by mouth daily.     zolpidem (AMBIEN) 5 MG tablet Take 1 tablet (5 mg total) by mouth at bedtime. As needed 90 tablet 1   No current facility-administered medications for this visit.    Allergies as of 10/29/2022 - Review Complete 10/29/2022  Allergen Reaction Noted   Sulfamethoxazole-trimethoprim Hives 03/29/2011   Avelox [moxifloxacin hcl in nacl]  03/29/2011   Cephalexin Hives 03/29/2011   Codeine Hives 03/29/2011   Cymbalta [duloxetine hcl]  07/23/2022   Erythromycin Hives 03/29/2011   Flagyl [metronidazole]  12/01/2019   Paxlovid [nirmatrelvir-ritonavir]  07/23/2022   Propranolol Hives 04/15/2016   Sulfa antibiotics Hives 03/29/2011   Tetanus toxoid Swelling 05/18/2016   Tramadol Hives 04/05/2014   Statins Other (See Comments) 12/03/2019    Family History  Problem Relation Age of Onset   Hyperlipidemia Father    Hypertension Father    Arthritis Father    Diabetes Father    Asthma Father    COPD Father    Early death Father    Parkinson's disease Father    Hypertension Mother    Hyperlipidemia Mother    Macular degeneration Mother    Arthritis Mother    Hearing loss Mother  Heart disease Mother    Kidney disease Mother    Throat cancer Brother        lung, and tongue   Arthritis Brother    COPD Brother    Diabetes type II Sister    COPD Sister    Heart disease Sister    Hypertension  Sister    Thyroid cancer Sister    Lung cancer Sister    Early death Sister    COPD Sister    Breast cancer Maternal Aunt    Lung cancer Other        uncle   Emphysema Maternal Aunt    Emphysema Maternal Uncle    Clotting disorder Sister    Brain cancer Sister        brain tumor- not malignant   Breast cancer Cousin    Cancer Sister    Alcohol abuse Sister    COPD Sister    Early death Sister    Alcohol abuse Brother    Arthritis Brother    Depression Brother    Diabetes Brother    Hypercholesterolemia Brother    Colon cancer Neg Hx    Esophageal cancer Neg Hx    Rectal cancer Neg Hx    Stomach cancer Neg Hx     Social History   Socioeconomic History   Marital status: Married    Spouse name: Windy Canny   Number of children: 2   Years of education: Not on file   Highest education level: Some college, no degree  Occupational History   Occupation: homemaker  Tobacco Use   Smoking status: Never   Smokeless tobacco: Never  Vaping Use   Vaping Use: Never used  Substance and Sexual Activity   Alcohol use: Yes    Comment: very rarely   Drug use: No   Sexual activity: Yes    Partners: Male    Comment: married  Other Topics Concern   Not on file  Social History Narrative   Marital status/children/pets: married, 2 children   Education/employment: HS grad. retired   Engineer, materials:      -smoke alarm in the home:Yes     - wears seatbelt: Yes     - Feels safe in their relationships: Yes   Social Determinants of Health   Financial Resource Strain: Low Risk  (06/27/2022)   Overall Financial Resource Strain (CARDIA)    Difficulty of Paying Living Expenses: Not hard at all  Food Insecurity: No Food Insecurity (06/27/2022)   Hunger Vital Sign    Worried About Running Out of Food in the Last Year: Never true    Ran Out of Food in the Last Year: Never true  Transportation Needs: No Transportation Needs (06/27/2022)   PRAPARE - Hydrologist  (Medical): No    Lack of Transportation (Non-Medical): No  Physical Activity: Sufficiently Active (06/27/2022)   Exercise Vital Sign    Days of Exercise per Week: 5 days    Minutes of Exercise per Session: 30 min  Stress: No Stress Concern Present (06/27/2022)   [place]    Feeling of Stress : Not at all  Social Connections: [place] (06/27/2022)   Social Connection and Isolation Panel [NHANES]    Frequency of Communication with Friends and Family: Three times a week    Frequency of Social Gatherings with Friends and Family: Three times a week    Attends Religious Services: More than 4 times  per year    Active Member of Clubs or Organizations: Not on file    Attends Club or Organization Meetings: More than 4 times per year    Marital Status: Married  Intimate Partner Violence: Not At Risk (06/27/2022)   Humiliation, Afraid, Rape, and Kick questionnaire    Fear of Current or Ex-Partner: No    Emotionally Abused: No    Physically Abused: No    Sexually Abused: No    Review of Systems:    Constitutional: No weight loss, fever or chills Cardiovascular: No chest pain Respiratory: No SOB  Gastrointestinal: See HPI and otherwise negative   Physical Exam:  Vital signs: BP 118/68   Pulse 87   Ht '4\' 11"'$  (1.499 m)   Wt 136 lb (61.7 kg)   SpO2 96%   BMI 27.47 kg/m    Constitutional:   Pleasant Elderly Caucasian female appears to be in NAD, Well developed, Well nourished, alert and cooperative Respiratory: Respirations even and unlabored. Lungs clear to auscultation bilaterally.   No wheezes, crackles, or rhonchi.  Cardiovascular: Normal S1, S2. No MRG. Regular rate and rhythm. No peripheral edema, cyanosis or pallor.  Gastrointestinal:  Soft, nondistended, mild generalized ttp, No rebound or guarding. Normal bowel sounds. No appreciable masses or hepatomegaly. Psychiatric: Oriented to person, place and time.  Demonstrates good judgement and reason without abnormal affect or behaviors.  RELEVANT LABS AND IMAGING: CBC    Component Value Date/Time   WBC 5.5 04/24/2022 0840   RBC 4.54 04/24/2022 0840   HGB 13.1 04/24/2022 0840   HCT 38.8 04/24/2022 0840   PLT 236.0 04/24/2022 0840   MCV 85.4 04/24/2022 0840   MCH 28.3 04/28/2021 0934   MCHC 33.8 04/24/2022 0840   RDW 14.1 04/24/2022 0840   LYMPHSABS 1.4 10/25/2021 0950   MONOABS 0.7 10/25/2021 0950   EOSABS 0.1 10/25/2021 0950   BASOSABS 0.1 10/25/2021 0950    CMP     Component Value Date/Time   NA 137 07/26/2022 1641   NA 138 01/04/2021 1001   K 4.5 07/26/2022 1641   CL 100 07/26/2022 1641   CO2 26 07/26/2022 1641   GLUCOSE 164 (H) 07/26/2022 1641   BUN 9 07/26/2022 1641   BUN 12 01/04/2021 1001   CREATININE 0.71 07/26/2022 1641   CALCIUM 9.5 07/26/2022 1641   PROT 6.6 04/24/2022 0840   PROT 6.5 01/04/2021 1001   ALBUMIN 4.4 04/24/2022 0840   ALBUMIN 4.3 01/04/2021 1001   AST 22 04/24/2022 0840   ALT 17 04/24/2022 0840   ALKPHOS 87 04/24/2022 0840   BILITOT 0.5 04/24/2022 0840   BILITOT 0.3 01/04/2021 1001   GFRNONAA >60 04/28/2021 0934    Assessment: 1.  Nausea: About once a month with patient's IBS symptoms, treated with Zofran  2.  IBS 3.  Constipation 4.  Generalized abdominal pain  Plan: 1.  Patient will continue on her current regimen as far as constipation and IBS. 2.  Refilled Zofran 4 mg every 4-6 hours as needed for nausea #30 with 3 refills. 3.  Refills will be good for 2 years from now as long as patient's symptoms are stable.  After 2 years she will need to be seen in clinic for follow-up. 4.  Chart sent to Dr. Silverio Decamp today in lieu of Dr. Ardis Hughs absence.  Ellouise Newer, PA-C [facility] 10/29/2022, 10:38 AM  Cc: Howard Pouch A, DO

## 2022-10-31 ENCOUNTER — Ambulatory Visit
Admission: RE | Admit: 2022-10-31 | Discharge: 2022-10-31 | Disposition: A | Discharge status: Home / Self Care | Payer: Medicare HMO | Source: Ambulatory Visit | Attending: Family Medicine | Admitting: Family Medicine

## 2022-10-31 ENCOUNTER — Encounter: Payer: Self-pay | Admitting: Sports Medicine

## 2022-10-31 DIAGNOSIS — Z1231 Encounter for screening mammogram for malignant neoplasm of breast: Secondary | ICD-10-CM

## 2022-11-14 ENCOUNTER — Ambulatory Visit (INDEPENDENT_AMBULATORY_CARE_PROVIDER_SITE_OTHER): Payer: Medicare HMO

## 2022-11-14 ENCOUNTER — Ambulatory Visit (INDEPENDENT_AMBULATORY_CARE_PROVIDER_SITE_OTHER): Payer: Medicare HMO | Admitting: Sports Medicine

## 2022-11-14 DIAGNOSIS — M65331 Trigger finger, right middle finger: Secondary | ICD-10-CM

## 2022-11-14 DIAGNOSIS — M65321 Trigger finger, right index finger: Secondary | ICD-10-CM

## 2022-11-14 DIAGNOSIS — M65351 Trigger finger, right little finger: Secondary | ICD-10-CM

## 2022-11-14 DIAGNOSIS — M65322 Trigger finger, left index finger: Secondary | ICD-10-CM

## 2022-11-14 DIAGNOSIS — M65352 Trigger finger, left little finger: Secondary | ICD-10-CM

## 2022-11-14 DIAGNOSIS — M65312 Trigger thumb, left thumb: Secondary | ICD-10-CM

## 2022-11-14 DIAGNOSIS — M65332 Trigger finger, left middle finger: Secondary | ICD-10-CM

## 2022-11-14 DIAGNOSIS — M65311 Trigger thumb, right thumb: Secondary | ICD-10-CM

## 2022-11-14 DIAGNOSIS — M65341 Trigger finger, right ring finger: Secondary | ICD-10-CM | POA: Diagnosis not present

## 2022-11-14 DIAGNOSIS — M65342 Trigger finger, left ring finger: Secondary | ICD-10-CM

## 2022-11-14 NOTE — Progress Notes (Signed)
    Procedures performed today:    Procedure: Real-time Ultrasound Guided injection of the right fourth flexor tendon sheath Device: Samsung [id number]  Verbal informed consent obtained.  Time-out conducted.  Noted no overlying erythema, induration, or other signs of local infection.  Skin prepped in a sterile fashion.  Local anesthesia: Topical Ethyl chloride.  With sterile technique and under real time ultrasound guidance: Noted flexor tendon nodule, 1/2 cc lidocaine, 1/2 cc kenalog 40 injected easily. Completed without difficulty  Advised to call if fevers/chills, erythema, induration, drainage, or persistent bleeding.  Images permanently stored and available for review in PACS.  Impression: Technically successful ultrasound guided injection.  Independent interpretation of notes and tests performed by another provider:   None.  Brief History, Exam, Impression, and Recommendations:    Trigger finger of all digits of both hands Rita Levine, she had a left fourth flexor tendon sheath injection in December 2023, now having increasing pain right fourth flexor tendon sheath, we did a flexor tendon sheath injection today, return as needed.    ____________________________________________ Gwen Her. Dianah Field, M.D., ABFM., CAQSM., AME. Primary Care and Sports Medicine [facility]  Adjunct Professor of [place] [employer]  Risk manager

## 2022-11-14 NOTE — Assessment & Plan Note (Signed)
Rita Levine returns, she had a left fourth flexor tendon sheath injection in December 2023, now having increasing pain right fourth flexor tendon sheath, we did a flexor tendon sheath injection today, return as needed.

## 2022-11-20 NOTE — Progress Notes (Unsigned)
Assessment/Plan:   1.  Abnormal MRI  -Discussed with patient findings on her MRI brain, which demonstrated very mild cerebral small vessel disease.  This is common in this age group, especially in patients who have hypertension and hyperlipidemia, uncontrolled in past, now well controlled.  She had migraine in the past as well.  The treatment is to control risk factors.  Her LDL is less than 70, at goal, on Crestor.  Reassurance provided.     Subjective:   Rita Levine was seen today in follow up.  I have not seen the patient in 2 years, at which time I saw her for complaints of tremor.  At that point in time, I did not see any tremor on examination.  She is apparently seen today as she wanted to go over an MRI of the brain that was ordered by primary care.  This was done in November, 2023.  I personally reviewed it.  There is a mild number of T2 hyperintensities.  This was mostly at the gray-white junction.  Patient does have a history of hypertension and hyperlipidemia.    CURRENT MEDICATIONS:  Outpatient Encounter Medications as of 11/21/2022  Medication Sig   albuterol (PROAIR HFA) 108 (90 Base) MCG/ACT inhaler Inhale 2 puffs into the lungs every 6 (six) hours as needed.   albuterol (PROVENTIL) (2.5 MG/3ML) 0.083% nebulizer solution Take 3 mLs (2.5 mg total) by nebulization every 6 (six) hours as needed.   ARMOUR THYROID 15 MG tablet Take 1 tablet by mouth in the AM Once a day   ARMOUR THYROID 30 MG tablet Take 1 tablet by mouth in the AM once a day   Ascorbic Acid (VITAMIN C) 1000 MG tablet Take 3,000 mg by mouth daily.   atenolol (TENORMIN) 25 MG tablet Take 0.5-1 tablets (12.5-25 mg total) by mouth daily.   calcium carbonate (TUMS - DOSED IN MG ELEMENTAL CALCIUM) 500 MG chewable tablet Chew 3 tablets by mouth daily as needed for indigestion or heartburn.   Carboxymethylcellulose Sodium (THERATEARS OP) Place 1 drop into both eyes 2 (two) times daily.   Cholecalciferol (VITAMIN D) 125  MCG (5000 UT) CAPS Take 5,000 Units by mouth daily.   clobetasol (TEMOVATE) 0.05 % external solution Apply 1 Application topically 2 (two) times daily.   clotrimazole (MYCELEX) 10 MG troche Take 1 tablet (10 mg total) by mouth 4 (four) times daily as needed.   Coenzyme Q10 (COQ-10) 100 MG CAPS Take 100 mg by mouth daily.   ezetimibe (ZETIA) 10 MG tablet Take 1 tablet (10 mg total) by mouth daily.   famotidine (PEPCID) 40 MG tablet TAKE 1 TABLET BY MOUTH TWICE A DAY   fexofenadine (ALLERGY RELIEF) 180 MG tablet Take 1 tablet (180 mg total) by mouth daily.   fluconazole (DIFLUCAN) 100 MG tablet Take 100 mg by mouth daily.   fluticasone (FLONASE) 50 MCG/ACT nasal spray Place 2 sprays into both nostrils daily.   folic acid (FOLVITE) 1 MG tablet SMARTSIG:2 Tablet(s) By Mouth   hydrochlorothiazide (MICROZIDE) 12.5 MG capsule Take 1 capsule (12.5 mg total) by mouth daily as needed.   hyoscyamine (LEVSIN) 0.125 MG tablet Take 1 tablet (0.125 mg total) by mouth every 6 (six) hours as needed.   mometasone (ELOCON) 0.1 % lotion Apply 1 application topically daily as needed (ear irritation).    montelukast (SINGULAIR) 10 MG tablet TAKE 1 TABLET BY MOUTH EVERYDAY AT BEDTIME   mupirocin ointment (BACTROBAN) 2 % Apply 1 Application topically 2 (two)  times daily.   ondansetron (ZOFRAN-ODT) 4 MG disintegrating tablet Take 1 tablet by mouth every 4-6 hours   pantoprazole (PROTONIX) 40 MG tablet Take 1 tablet (40 mg total) by mouth 2 (two) times daily.   Probiotic CAPS Take 1 capsule by mouth daily.   rosuvastatin (CRESTOR) 10 MG tablet TAKE 1 TABLET BY MOUTH THREE TIMES A WEEK   SYMBICORT 80-4.5 MCG/ACT inhaler INHALE TWO PUFFS INTO THE LUNGS IN THE MORNING AND AT BEDTIME   tiZANidine (ZANAFLEX) 4 MG tablet Take 1 tablet (4 mg total) by mouth every 6 (six) hours as needed for muscle spasms.   valACYclovir (VALTREX) 1000 MG tablet 2 tabs at onset, rpt 2 tabs in 12 hours once.   VITAMIN A PO Take 1 tablet by mouth  daily.   zinc gluconate 50 MG tablet Take 100 mg by mouth daily.   zolpidem (AMBIEN) 5 MG tablet Take 1 tablet (5 mg total) by mouth at bedtime. As needed   No facility-administered encounter medications on file as of 11/21/2022.     Objective:   PHYSICAL EXAMINATION:    VITALS:   Vitals:   11/21/22 0836  BP: 118/76  Pulse: 63  SpO2: 98%  Weight: 136 lb 6.4 oz (61.9 kg)  Height: '4\' 11"'$  (1.499 m)    GEN:  The patient appears stated age and is in NAD. Speech is fluent and clear Ambulates well in the hall when leaving the clinic  Lab Results  Component Value Date   CHOL 156 04/24/2022   CHOL 167 01/04/2021   CHOL 182 11/28/2020   Lab Results  Component Value Date   HDL 56.40 04/24/2022   HDL 49 01/04/2021   HDL 42 (L) 11/28/2020   Lab Results  Component Value Date   LDLCALC 67 04/24/2022   LDLCALC 92 01/04/2021   LDLCALC 90 11/28/2020   Lab Results  Component Value Date   TRIG 163.0 (H) 04/24/2022   TRIG 149 01/04/2021   TRIG 375 (H) 11/28/2020   Lab Results  Component Value Date   CHOLHDL 3 04/24/2022   CHOLHDL 3.4 01/04/2021   CHOLHDL 4.3 11/28/2020   No results found for: "LDLDIRECT"  Lab Results  Component Value Date   HGBA1C 6.2 04/24/2022       Cc:  Kuneff, Renee A, DO

## 2022-11-21 ENCOUNTER — Ambulatory Visit: Payer: Medicare HMO | Admitting: Neurology

## 2022-11-21 ENCOUNTER — Encounter: Payer: Self-pay | Admitting: Neurology

## 2022-11-21 VITALS — BP 118/76 | HR 63 | Ht 59.0 in | Wt 136.4 lb

## 2022-11-21 DIAGNOSIS — R9402 Abnormal brain scan: Secondary | ICD-10-CM | POA: Diagnosis not present

## 2022-11-21 NOTE — Patient Instructions (Signed)
Good to see you today!!  Your brain looks well and healthy!  Get regular exercise and stay less stressed :)  The physicians and staff at [facility] are committed to providing excellent care. You may receive a survey requesting feedback about your experience at our office. We strive to receive "very good" responses to the survey questions. If you feel that your experience would prevent you from giving the office a "very good " response, please contact our office to try to remedy the situation. We may be reached at [phone]. Thank you for taking the time out of your busy day to complete the survey.

## 2022-11-30 ENCOUNTER — Other Ambulatory Visit: Payer: Self-pay | Admitting: Gastroenterology

## 2022-11-30 DIAGNOSIS — R1314 Dysphagia, pharyngoesophageal phase: Secondary | ICD-10-CM

## 2022-12-02 ENCOUNTER — Ambulatory Visit: Payer: Medicare HMO | Admitting: Nurse Practitioner

## 2022-12-02 ENCOUNTER — Encounter: Payer: Self-pay | Admitting: Nurse Practitioner

## 2022-12-02 ENCOUNTER — Ambulatory Visit (INDEPENDENT_AMBULATORY_CARE_PROVIDER_SITE_OTHER): Payer: Medicare HMO

## 2022-12-02 VITALS — BP 116/70 | HR 63 | Ht 59.0 in | Wt 136.4 lb

## 2022-12-02 DIAGNOSIS — R059 Cough, unspecified: Secondary | ICD-10-CM | POA: Diagnosis not present

## 2022-12-02 DIAGNOSIS — B9689 Other specified bacterial agents as the cause of diseases classified elsewhere: Secondary | ICD-10-CM | POA: Insufficient documentation

## 2022-12-02 DIAGNOSIS — J45909 Unspecified asthma, uncomplicated: Secondary | ICD-10-CM | POA: Diagnosis not present

## 2022-12-02 DIAGNOSIS — J019 Acute sinusitis, unspecified: Secondary | ICD-10-CM | POA: Diagnosis not present

## 2022-12-02 DIAGNOSIS — J4541 Moderate persistent asthma with (acute) exacerbation: Secondary | ICD-10-CM | POA: Diagnosis not present

## 2022-12-02 MED ORDER — BENZONATATE 200 MG PO CAPS: 200.0000 mg | ORAL_CAPSULE | Freq: Three times a day (TID) | ORAL | 1 refills | Status: DC | PRN

## 2022-12-02 MED ORDER — FLUTICASONE-SALMETEROL 115-21 MCG/ACT IN AERO: 2.0000 | INHALATION_SPRAY | Freq: Two times a day (BID) | RESPIRATORY_TRACT | 11 refills | Status: DC

## 2022-12-02 MED ORDER — PREDNISONE 10 MG PO TABS: ORAL_TABLET | ORAL | 0 refills | Status: DC

## 2022-12-02 MED ORDER — AMOXICILLIN-POT CLAVULANATE 875-125 MG PO TABS: 1.0000 | ORAL_TABLET | Freq: Two times a day (BID) | ORAL | 0 refills | Status: AC

## 2022-12-02 NOTE — Assessment & Plan Note (Signed)
Asthma exacerbation. Non-toxic and VS stable. We will treat her with prednisone taper. Action plan in place. Insurance has recently changed and no longer covering Symbicort. We will change her to Advair HFA maintenance. Medication education provided. CXR today to rule out superimposed infection.   Patient Instructions  -Stop symbicort. Switch to Advair 2 puffs Twice daily. Brush tongue and rinse mouth afterwards. Use with spacer.  -Continue albuterol inhaler 2 puffs or 3 mL nebulizer every 6 hours as needed for shortness of breath or cough -Continue sinuglair 10 mg At bedtime -Continue protonix 40 mg Twice daily -Continue pepcid 10 mg daily  -Continue Xyzal 5 mg At bedtime  -Continue nasal irrigation treatments per ENT -Continue flonase 2 sprays each nostril Twice daily for nasal congestion/drainage  -Continue Chlortab 4 mg over the counter At bedtime for cough -Continue mucinex over the counter     Prednisone taper. 4 tabs for 2 days, then 3 tabs for 2 days, 2 tabs for 2 days, then 1 tab for 2 days, then stop. Take in AM with food Augmentin 1 tab Twice daily for 7 days. Take with food. Take a daily probiotic while on the augmentin or eat yogurt  Benzonatate 1 capsule Three times a day as needed for cough - would recommend using consistently over the next 3-4 days until cough is resolved/improved  Chest x ray today   Follow up in 4 months with Dr. Melvyn Novas or Alanson Aly. If symptoms do not improve or worsen, please contact office for sooner follow up or seek emergency care.

## 2022-12-02 NOTE — Progress Notes (Signed)
$@Patientp$  ID: Rita Levine, female    DOB: 02/25/1951, 72 y.o.   MRN: [id number]  Chief Complaint  Patient presents with   Follow-up    Pt f/u she reports she has had to use her rescue inhaler increasingly going on a month and insurance is no longer covering Symbicort and she is wanting to switch back to Advair.      Referring provider: Ma Hillock, DO  HPI: 72 year old female, never smoker followed for hx of asthma, chronic cough.  She is a patient of Dr. Gustavus Bryant and was last seen in office on 05/30/2022 by [facility] NP.  Past medical history significant for hypertension, aortic atherosclerosis, CAD, pulmonary hypertension, GERD, IBS, hypothyroidism, HLD, fibromyalgia, allergic rhinitis  TEST/EVENTS:  01/15/2021 CT chest with contrast: moderate aortic atherosclerosis. Occasional coronary artery calcifications. Tiny hiatal hernia. Linear atelectasis/scar in the lingula, similar to prior exam. 3 mm pleural based nodule in the right upper lobe, likely benign. 04/28/2021 CXR 1 view: Lung volumes normal.  Pulmonary vasculature and the cardiomediastinal silhouette are within normal limits. 10/25/2021: Allergen panel negative, eos 100 05/30/2022 PFT: FVC 93, FEV1 115, ratio 94, TLC 88, DLCOcor 117. No BD 06/03/2022 CT chest w con: subcm nodes in mediastinum with no significant change. 3 mm pleural based nodule in RUL with no significant change. No acute process. Fatty infiltration of the liver. Atherosclerosis.   08/02/2021: OV with Dr. Melvyn Novas.  Initial consult for chronic cough since having pneumonia in 2019.  No report of dyspnea.  Was previously placed on Symbicort 80 and was also using [place] twice a day.  Cough worse with Symbicort -suspected upper airway cough syndrome.  Recommended stopping Symbicort on trial basis.  Aggressive treatment of GERD with Protonix 40 mg twice a day and Pepcid 20 mg in the evening.  Albuterol and budesonide nebs up to twice a day if needed.  6-day prednisone  taper.  09/12/2021: OV with Willadeen Colantuono NP for 6-week follow-up.  Did not notice major change in her symptoms.  She did experience some shortness of breath and chest tightness after burning some trash in her yard and working in the attic earlier in the week.  Felt some improvement but had not returned to baseline.  Persistent cough with primarily clear sputum production.  Allergy symptoms uncontrolled and currently being treated by ENT.  Felt GERD symptoms improved with twice daily PPI.  Increased ICS budesonide nebs to 0.5 mg.  Prednisone taper.  10/25/2021: OV with Makyna Niehoff NP for 6 week follow up. She continues to experience persistent cough with clear sputum production. She occasionally experiences minimal shortness of breath upon exertion and with coughing spells. She continues to have post nasal drip and difficulties with her chronic sinusitis which she is scheduled to follow up with ENT this week to discuss. She also notes occasional breakthrough reflux, despite Twice daily PPI and pepcid nightly. She elevates the head of her bed and avoids triggering foods. She did notice some improvement in her symptoms with the prednisone taper and reports that her albuterol rescue inhaler does relieve her shortness of breath. FeNO nl. Persistent productive cough and minimal SOB with exertion. Suspect uncontrolled chronic sinusitis and GERD contributing. Hx of asthma - spirometry 2012 normal; no reports of wheezing. Slight improvement with prednisone. Trial triple therapy inhaler with Breztri 2 puffs Twice daily. Stop nebs. PRN albuterol. Instructed to follow up with ENT as scheduled. Advised to follow up with GI to discuss breakthrough GERD as she is already on Twice daily  PPI and H2 once daily - may need EGD for further eval. Continue Singulair, Xyzal, saline nasal rinses. PFTs ordered today. Allergen panel and CBC with diff.   05/08/2022: OV with Foye Haggart NP for overdue follow-up.  She is still having a persistent chronic cough,  which is possibly slightly worse than it was previously.  She has not noticed a huge change..  Usually it is nonproductive but occasionally she will produce clear to yellow sputum, which is usually in the morning.  Breathing is unchanged ; will get short of breath with strenuous activity or climbing stairs.  She also has persistent hoarseness, which she feels like is worse than it has been in the past.  She has chronic sinusitis and uses multiple nasal rinses.  She was seen by ENT after her last visit but only discussed a spot on her tongue and did not discuss her hoarseness or her persistent chronic sinusitis.  Feels like her GERD symptoms are better controlled.  She denies any wheezing, increased shortness of breath, orthopnea, PND, hemoptysis, weight loss, anorexia, fatigue, dysphagia.  She was previously trialed on step up to [place].  Did not notice a huge difference when back to low-dose Symbicort.  Uses this with a spacer.  Never completed her PFTs because she did not want to get COVID tested again.  Her PCP has ordered a CT chest for evaluation of the previously identified small nodule. CXR with bronchitic changes - treated with doxy and prednisone.  05/30/2022: OV with Jamani Bearce NP for follow up after undergoing pulmonary function testing, which was overall normal. She reports she has been doing well since I saw her last. Still has an occasional dry cough but it is stable. Her breathing is unchanged. Unsure if the last course of prednisone and antibiotics did much for her. She denies any recent wheezing, chest congestion, fevers, night seats, hemoptysis, anorexia, weight loss. She is on Symbicort Twice daily. Rarely uses albuterol. She still has some issues with postnasal drainage and nasal congestion. Due to see ENT next week. She uses flonase, xyzal, and singulair. Does nasal irrigations daily. Denies any headaches or purulent drainage. She has CT chest ordered by her PCP for nodule follow up next week.    12/02/2022: Today - follow up Patient presents today for intended follow up; however, she is also having some acute symptoms which started around a month ago. She has had a lot of sinus congestion with green mucus. She's been coughing more frequently, mostly dry during the day but productive in the AM with similar colored sputum. She feels like she's had more chest tightness, wheezing, and has been getting a little more winded. She denies fevers, chills, hemoptysis, leg swelling, facial tenderness She's using her rescue inhaler multiple times a week, which does help. She is still using Symbicort twice daily, singulai, Xyzal, and nasal irrigation/sprays as directed by ENT.  Allergies  Allergen Reactions   Sulfamethoxazole-Trimethoprim Hives   Avelox [Moxifloxacin Hcl In Nacl]     Racing heart   Cephalexin Hives   Codeine Hives    Heart racing   Cymbalta [Duloxetine Hcl]    Erythromycin Hives   Flagyl [Metronidazole]     Unknown reaction   Paxlovid [Nirmatrelvir-Ritonavir]    Propranolol Hives   Sulfa Antibiotics Hives   Tetanus Toxoid Swelling    Reports a fever and headache with swelling.    Tramadol Hives   Statins Other (See Comments)    Myalgia     Immunization History  Administered  Date(s) Administered   Influenza-Unspecified 06/11/2019, 07/14/2020   Pneumococcal Conjugate-13 07/03/2016   Pneumococcal Polysaccharide-23 08/03/2019   Tdap 05/02/2014   Zoster, Live 10/14/2010    Past Medical History:  Diagnosis Date   Allergic rhinitis    Allergic rhinitis due to pollen 12/03/2019   Allergy    Anemia    Asthma    Cataract    Chest pain, unspecified 04/07/2014   Chicken pox    Colon polyp    Diverticulosis    Dysphagia    Dysrhythmia    Palpitations   Fibromyalgia    Food allergy    Gallstone    GERD (gastroesophageal reflux disease)    Hiatal hernia    Hypercholesteremia    Hypertension    Hypothyroidism    IBS (irritable bowel syndrome)    Ingrown  toenail 08/23/2020   Migraine headache    occasionally   Morton neuroma, 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   Osteoarthritis    Osteopenia    Pneumonia    PONV (postoperative nausea and vomiting)    Pulmonary hypertension ([place])    pt denies, pt states she was tested for it but was not diagnosed with it   Rectal bleeding    SBO (small bowel obstruction) ([place]) 10/20/2017   Seasonal allergies    Subconjunctival hemorrhage of left eye 08/01/2022   Thyroid nodule    Trigger finger of left and right ring fingers 01/11/2021   Trigger finger of left hand 01/11/2021    Tobacco History: Social History   Tobacco Use  Smoking Status Never  Smokeless Tobacco Never   Counseling given: Not Answered   Outpatient Medications Prior to Visit  Medication Sig Dispense Refill   albuterol (PROAIR HFA) 108 (90 Base) MCG/ACT inhaler Inhale 2 puffs into the lungs every 6 (six) hours as needed. 6.7 g 5   albuterol (PROVENTIL) (2.5 MG/3ML) 0.083% nebulizer solution Take 3 mLs (2.5 mg total) by nebulization every 6 (six) hours as needed. 75 mL 0   ARMOUR THYROID 15 MG tablet Take 1 tablet by mouth in the AM Once a day 90 tablet 3   ARMOUR THYROID 30 MG tablet Take 1 tablet by mouth in the AM once a day 90 tablet 3   Ascorbic Acid (VITAMIN C) 1000 MG tablet Take 3,000 mg by mouth daily.     atenolol (TENORMIN) 25 MG tablet Take 0.5-1 tablets (12.5-25 mg total) by mouth daily. 90 tablet 1   calcium carbonate (TUMS - DOSED IN MG ELEMENTAL CALCIUM) 500 MG chewable tablet Chew 3 tablets by mouth daily as needed for indigestion or heartburn.     Carboxymethylcellulose Sodium (THERATEARS OP) Place 1 drop into both eyes 2 (two) times daily.     Cholecalciferol (VITAMIN D) 125 MCG (5000 UT) CAPS Take 5,000 Units by mouth daily.     clobetasol (TEMOVATE) 0.05 % external solution Apply 1 Application topically 2 (two) times daily. 50 mL 2   clotrimazole (MYCELEX) 10 MG troche Take 1 tablet (10 mg total) by mouth 4 (four) times daily  as needed. 24 tablet 3   Coenzyme Q10 (COQ-10) 100 MG CAPS Take 100 mg by mouth daily.     ezetimibe (ZETIA) 10 MG tablet Take 1 tablet (10 mg total) by mouth daily. 90 tablet 3   famotidine (PEPCID) 40 MG tablet TAKE 1 TABLET BY MOUTH TWICE A DAY 180 tablet 1   fexofenadine (ALLERGY RELIEF) 180 MG tablet Take 1 tablet (180 mg total) by mouth daily. 90 tablet 1  fluconazole (DIFLUCAN) 100 MG tablet Take 100 mg by mouth daily.     fluticasone (FLONASE) 50 MCG/ACT nasal spray Place 2 sprays into both nostrils daily. 16 mL 11   folic acid (FOLVITE) 1 MG tablet SMARTSIG:2 Tablet(s) By Mouth     hydrochlorothiazide (MICROZIDE) 12.5 MG capsule Take 1 capsule (12.5 mg total) by mouth daily as needed. 90 capsule 1   hyoscyamine (LEVSIN) 0.125 MG tablet Take 1 tablet (0.125 mg total) by mouth every 6 (six) hours as needed. 120 tablet 5   mometasone (ELOCON) 0.1 % lotion Apply 1 application topically daily as needed (ear irritation).      montelukast (SINGULAIR) 10 MG tablet TAKE 1 TABLET BY MOUTH EVERYDAY AT BEDTIME 90 tablet 1   mupirocin ointment (BACTROBAN) 2 % Apply 1 Application topically 2 (two) times daily. 30 g 2   ondansetron (ZOFRAN-ODT) 4 MG disintegrating tablet Take 1 tablet by mouth every 4-6 hours 30 tablet 3   pantoprazole (PROTONIX) 40 MG tablet Take 1 tablet (40 mg total) by mouth 2 (two) times daily. 180 tablet 3   Probiotic CAPS Take 1 capsule by mouth daily.     rosuvastatin (CRESTOR) 10 MG tablet TAKE 1 TABLET BY MOUTH THREE TIMES A WEEK 36 tablet 2   tiZANidine (ZANAFLEX) 4 MG tablet Take 1 tablet (4 mg total) by mouth every 6 (six) hours as needed for muscle spasms. 120 tablet 5   valACYclovir (VALTREX) 1000 MG tablet 2 tabs at onset, rpt 2 tabs in 12 hours once. 20 tablet 1   VITAMIN A PO Take 1 tablet by mouth daily.     zinc gluconate 50 MG tablet Take 100 mg by mouth daily.     zolpidem (AMBIEN) 5 MG tablet Take 1 tablet (5 mg total) by mouth at bedtime. As needed 90 tablet 1    SYMBICORT 80-4.5 MCG/ACT inhaler INHALE TWO PUFFS INTO THE LUNGS IN THE MORNING AND AT BEDTIME 30.6 each 5   No facility-administered medications prior to visit.     Review of Systems:   Constitutional: No weight loss or gain, night sweats, fevers, chills, fatigue, or lassitude. HEENT: No headaches, difficulty swallowing, tooth/dental problems, or sore throat. No sneezing, itching, ear ache. +nasal congestion/drainage CV:  No chest pain, orthopnea, PND, swelling in lower extremities, anasarca, dizziness, palpitations, syncope Resp: +shortness of breath with exertion; increased cough; wheezing. No hemoptysis.  No chest wall deformity GI:  No heartburn, indigestion, abdominal pain, nausea, vomiting, diarrhea, change in bowel habits, loss of appetite, bloody stools.  GU: No dysuria, change in color of urine, urgency or frequency.   Skin: No rash, lesions, ulcerations MSK:  No joint pain or swelling.   Neuro: No dizziness or lightheadedness.  Psych: No depression or anxiety. Mood stable.     Physical Exam:  BP 116/70   Pulse 63   Ht 4' 11"$  (1.499 m)   Wt 136 lb 6.4 oz (61.9 kg)   SpO2 99%   BMI 27.55 kg/m   GEN: Pleasant, interactive, well-nourished; in no acute distress. HEENT:  Normocephalic and atraumatic. EACs patent bilaterally. TM pearly gray with present light reflex bilaterally. PERRLA. Sclera white. Nasal turbinates erythematous, moist and patent bilaterally.  Right nasal polyp.  White rhinorrhea present. Oropharynx erythematous and moist, without exudate or edema. No lesions, ulcerations NECK:  Supple w/ fair ROM. No JVD present. Normal carotid impulses w/o bruits. Thyroid symmetrical with no goiter or nodules palpated. No lymphadenopathy.   CV: RRR, no m/r/g, no peripheral  edema. Pulses intact, +2 bilaterally. No cyanosis, pallor or clubbing. PULMONARY:  Unlabored, regular breathing. Minimal scattered rhonchi bilaterally A&P. No accessory muscle use. No dullness to  percussion. GI: BS present and normoactive. Soft, non-tender to palpation. No organomegaly or masses detected.  MSK: No erythema, warmth or tenderness. Cap refil <2 sec all extrem. No deformities or joint swelling noted.  Neuro: A/Ox3. No focal deficits noted.   Skin: Warm, no lesions or rashe Psych: Normal affect and behavior. Judgement and thought content appropriate.     Lab Results:  CBC    Component Value Date/Time   WBC 5.5 04/24/2022 0840   RBC 4.54 04/24/2022 0840   HGB 13.1 04/24/2022 0840   HCT 38.8 04/24/2022 0840   PLT 236.0 04/24/2022 0840   MCV 85.4 04/24/2022 0840   MCH 28.3 04/28/2021 0934   MCHC 33.8 04/24/2022 0840   RDW 14.1 04/24/2022 0840   LYMPHSABS 1.4 10/25/2021 0950   MONOABS 0.7 10/25/2021 0950   EOSABS 0.1 10/25/2021 0950   BASOSABS 0.1 10/25/2021 0950    BMET    Component Value Date/Time   NA 137 07/26/2022 1641   NA 138 01/04/2021 1001   K 4.5 07/26/2022 1641   CL 100 07/26/2022 1641   CO2 26 07/26/2022 1641   GLUCOSE 164 (H) 07/26/2022 1641   BUN 9 07/26/2022 1641   BUN 12 01/04/2021 1001   CREATININE 0.71 07/26/2022 1641   CALCIUM 9.5 07/26/2022 1641   GFRNONAA >60 04/28/2021 0934    BNP No results found for: "BNP"   Imaging:  DG Chest 2 View  Result Date: 12/02/2022 CLINICAL DATA:  Asthma exacerbation, cough for 1 month. EXAM: CHEST - 2 VIEW COMPARISON:  Chest radiograph 05/08/2022. FINDINGS: The cardiomediastinal silhouette is within normal limits There is no focal consolidation or pulmonary edema. There is peribronchial thickening and increased interstitial markings predominantly in the perihilar regions, similar to the prior study and favored chronic. There is no pleural effusion or pneumothorax There is no acute osseous abnormality. IMPRESSION: 1. No radiographic evidence of acute cardiopulmonary process. 2. Unchanged central peribronchial thickening and increased interstitial markings, likely chronic. Electronically Signed    By: Valetta Mole M.D.   On: 12/02/2022 10:43   [place] LIMITED JOINT SPACE STRUCTURES UP RIGHT  Result Date: 11/14/2022 Procedure: Real-time Ultrasound Guided injection of the right fourth flexor tendon sheath Device: Samsung [id number] Verbal informed consent obtained. Time-out conducted. Noted no overlying erythema, induration, or other signs of local infection. Skin prepped in a sterile fashion. Local anesthesia: Topical Ethyl chloride. With sterile technique and under real time ultrasound guidance: Noted flexor tendon nodule, 1/2 cc lidocaine, 1/2 cc kenalog 40 injected easily. Completed without difficulty Advised to call if fevers/chills, erythema, induration, drainage, or persistent bleeding. Images permanently stored and available for review in PACS. Impression: Technically successful ultrasound guided injection.         Latest Ref Rng & Units 05/30/2022    9:59 AM  PFT Results  FVC-Pre L 2.22   FVC-Predicted Pre % 93   FVC-Post L 2.30   FVC-Predicted Post % 96   Pre FEV1/FVC % % 93   Post FEV1/FCV % % 94   FEV1-Pre L 2.05   FEV1-Predicted Pre % 115   FEV1-Post L 2.15   DLCO uncorrected ml/min/mmHg 18.86   DLCO UNC% % 116   DLCO corrected ml/min/mmHg 19.03   DLCO COR %Predicted % 117   DLVA Predicted % 123   TLC L 3.80   TLC %  Predicted % 88   RV % Predicted % 68     No results found for: "NITRICOXIDE"      Assessment & Plan:   Asthma, intrinsic Asthma exacerbation. Non-toxic and VS stable. We will treat her with prednisone taper. Action plan in place. Insurance has recently changed and no longer covering Symbicort. We will change her to Advair HFA maintenance. Medication education provided. CXR today to rule out superimposed infection.   Patient Instructions  -Stop symbicort. Switch to Advair 2 puffs Twice daily. Brush tongue and rinse mouth afterwards. Use with spacer.  -Continue albuterol inhaler 2 puffs or 3 mL nebulizer every 6 hours as needed for shortness of breath or  cough -Continue sinuglair 10 mg At bedtime -Continue protonix 40 mg Twice daily -Continue pepcid 10 mg daily  -Continue Xyzal 5 mg At bedtime  -Continue nasal irrigation treatments per ENT -Continue flonase 2 sprays each nostril Twice daily for nasal congestion/drainage  -Continue Chlortab 4 mg over the counter At bedtime for cough -Continue mucinex over the counter     Prednisone taper. 4 tabs for 2 days, then 3 tabs for 2 days, 2 tabs for 2 days, then 1 tab for 2 days, then stop. Take in AM with food Augmentin 1 tab Twice daily for 7 days. Take with food. Take a daily probiotic while on the augmentin or eat yogurt  Benzonatate 1 capsule Three times a day as needed for cough - would recommend using consistently over the next 3-4 days until cough is resolved/improved  Chest x ray today   Follow up in 4 months with Dr. Melvyn Novas or Alanson Aly. If symptoms do not improve or worsen, please contact office for sooner follow up or seek emergency care.   Acute bacterial rhinosinusitis Worsening nasal congestion and purulent drainage x 1 month; no improvement with supportive care measures. Given length of symptoms, we will treat her for bacterial infection with augmentin course. She will continue nasal rinses and sprays as ordered. Encouraged her to follow up with ENT if symptoms do not improve.       Clayton Bibles, NP 12/02/2022  Pt aware and understands NP's role.

## 2022-12-02 NOTE — Progress Notes (Signed)
CXR stable without any evidence of infection. Continue with our plan as discussed. Thanks.

## 2022-12-02 NOTE — Telephone Encounter (Signed)
Mychart message sent by pt: Alcario Drought [facility] (supporting Marland Kitchen V, NP)11 minutes ago (11:48 AM)    One month of Advair is $100.00. Can you get it cheaper? Thank you     Routing to prior auth team to see which inhalers might be cheaper for pt based off of her insurance. Then once we have this info, will route to [facility] for review.

## 2022-12-02 NOTE — Assessment & Plan Note (Signed)
Worsening nasal congestion and purulent drainage x 1 month; no improvement with supportive care measures. Given length of symptoms, we will treat her for bacterial infection with augmentin course. She will continue nasal rinses and sprays as ordered. Encouraged her to follow up with ENT if symptoms do not improve.

## 2022-12-02 NOTE — Patient Instructions (Addendum)
-  Stop symbicort. Switch to Advair 2 puffs Twice daily. Brush tongue and rinse mouth afterwards. Use with spacer.  -Continue albuterol inhaler 2 puffs or 3 mL nebulizer every 6 hours as needed for shortness of breath or cough -Continue sinuglair 10 mg At bedtime -Continue protonix 40 mg Twice daily -Continue pepcid 10 mg daily  -Continue Xyzal 5 mg At bedtime  -Continue nasal irrigation treatments per ENT -Continue flonase 2 sprays each nostril Twice daily for nasal congestion/drainage  -Continue Chlortab 4 mg over the counter At bedtime for cough -Continue mucinex over the counter     Prednisone taper. 4 tabs for 2 days, then 3 tabs for 2 days, 2 tabs for 2 days, then 1 tab for 2 days, then stop. Take in AM with food Augmentin 1 tab Twice daily for 7 days. Take with food. Take a daily probiotic while on the augmentin or eat yogurt  Benzonatate 1 capsule Three times a day as needed for cough - would recommend using consistently over the next 3-4 days until cough is resolved/improved  Chest x ray today   Follow up in 4 months with Dr. Melvyn Novas or Alanson Aly. If symptoms do not improve or worsen, please contact office for sooner follow up or seek emergency care.

## 2022-12-09 ENCOUNTER — Other Ambulatory Visit (HOSPITAL_COMMUNITY): Payer: Self-pay

## 2022-12-09 NOTE — Telephone Encounter (Signed)
Routing to [place] for review.

## 2022-12-09 NOTE — Telephone Encounter (Signed)
Per test claims the generic Adviar Diskus [facility]) seems to be a cheaper alternative at this time for $10.00

## 2022-12-11 MED ORDER — FLUTICASONE-SALMETEROL 250-50 MCG/ACT IN AEPB: 1.0000 | INHALATION_SPRAY | Freq: Two times a day (BID) | RESPIRATORY_TRACT | 5 refills | Status: DC

## 2022-12-11 NOTE — Telephone Encounter (Signed)
Rx sent for [place] 1 puff Twice daily. Brush tongue and rinse mouth afterwards. This is a different delivery method when compared to her current inhaler. The pharmacy should be able to show her how to properly use it; otherwise, she can come in and one of our nurses can. Thanks.

## 2022-12-18 ENCOUNTER — Other Ambulatory Visit: Payer: Self-pay | Admitting: Gastroenterology

## 2022-12-18 ENCOUNTER — Other Ambulatory Visit: Payer: Self-pay | Admitting: Family Medicine

## 2022-12-18 ENCOUNTER — Other Ambulatory Visit: Payer: Self-pay | Admitting: Physician Assistant

## 2023-01-24 ENCOUNTER — Ambulatory Visit: Payer: Medicare HMO | Admitting: Neurology

## 2023-01-27 ENCOUNTER — Encounter: Payer: Self-pay | Admitting: *Deleted

## 2023-01-27 DIAGNOSIS — C44722 Squamous cell carcinoma of skin of right lower limb, including hip: Secondary | ICD-10-CM | POA: Diagnosis not present

## 2023-01-27 DIAGNOSIS — Z85828 Personal history of other malignant neoplasm of skin: Secondary | ICD-10-CM | POA: Diagnosis not present

## 2023-01-27 DIAGNOSIS — D692 Other nonthrombocytopenic purpura: Secondary | ICD-10-CM | POA: Diagnosis not present

## 2023-02-14 ENCOUNTER — Other Ambulatory Visit: Payer: Self-pay | Admitting: Gastroenterology

## 2023-02-20 ENCOUNTER — Ambulatory Visit: Payer: Medicare HMO | Admitting: Gastroenterology

## 2023-02-20 ENCOUNTER — Other Ambulatory Visit (INDEPENDENT_AMBULATORY_CARE_PROVIDER_SITE_OTHER): Payer: Medicare HMO

## 2023-02-20 ENCOUNTER — Encounter: Payer: Self-pay | Admitting: Gastroenterology

## 2023-02-20 VITALS — BP 130/76 | HR 73 | Ht 59.0 in | Wt 134.0 lb

## 2023-02-20 DIAGNOSIS — R1084 Generalized abdominal pain: Secondary | ICD-10-CM

## 2023-02-20 DIAGNOSIS — R112 Nausea with vomiting, unspecified: Secondary | ICD-10-CM | POA: Diagnosis not present

## 2023-02-20 HISTORY — DX: Generalized abdominal pain: R10.84

## 2023-02-20 LAB — BASIC METABOLIC PANEL
BUN: 6 mg/dL (ref 6–23)
CO2: 31 mEq/L (ref 19–32)
Calcium: 9.8 mg/dL (ref 8.4–10.5)
Chloride: 100 mEq/L (ref 96–112)
Creatinine, Ser: 0.72 mg/dL (ref 0.40–1.20)
GFR: 83.92 mL/min (ref >60.00)
Glucose, Bld: 119 mg/dL — ABNORMAL HIGH (ref 70–99)
Potassium: 4 mEq/L (ref 3.5–5.1)
Sodium: 140 mEq/L (ref 135–145)

## 2023-02-20 NOTE — Progress Notes (Signed)
02/20/2023 TORILYNN SHIPPEE [id number] 01/06/1951   HISTORY OF PRESENT ILLNESS:  This is a 72 year old female who is a patient of Dr. Elana Alm with history of IBS and GERD as well as SBO due to adhesions requiring laparotomy with LOA.  She has complaints of generalized abdominal pain with frequent nausea and episodes of vomiting.  She has history of IBS and nausea that has been an ongoing issue for quite some time, but things seem to be worse recently.  Uses Zofran once or tiwce daily.  Having more frequent nausea with episodes of vomiting.  Generalized abdominal pain that feels like it goes from her rectum all the way up into the center of her abdomen.  She has hyoscyamine, but only uses that when the abdominal pain is very intense as it tends to constipate her.  She is on pantoprazole 40 mg twice daily and famotidine 40 mg once daily.  She has been taking some test senna tea as needed to help her move her bowels if she seems to get constipated.  02/05/2022 EGD with white esophageal plaques found consistent with candidiasis and small hiatal hernia. Patient started on Diflucan.   08/05/2016 colonoscopy with diverticulosis and hemorrhoids and otherwise normal.  Repeat recommended 10 years.   Her last visit here was in January 2024 with one of her other PAs at which time she was doing well at her baseline and really just needed her Zofran refilled.  Past Medical History:  Diagnosis Date   Allergic rhinitis    Allergic rhinitis due to pollen 12/03/2019   Allergy    Anemia    Asthma    Cataract    Chest pain, unspecified 04/07/2014   Chicken pox    Colon polyp    Diverticulosis    Dysphagia    Dysrhythmia    Palpitations   Fibromyalgia    Food allergy    Gallstone    GERD (gastroesophageal reflux disease)    Hiatal hernia    Hypercholesteremia    Hypertension    Hypothyroidism    IBS (irritable bowel syndrome)    Ingrown toenail 08/23/2020   Migraine headache    occasionally    Morton neuroma, 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   Osteoarthritis    Osteopenia    Pneumonia    PONV (postoperative nausea and vomiting)    Pulmonary hypertension (HCC)    pt denies, pt states she was tested for it but was not diagnosed with it   Rectal bleeding    SBO (small bowel obstruction) (HCC) 10/20/2017   Seasonal allergies    Subconjunctival hemorrhage of left eye 08/01/2022   Thyroid nodule    Trigger finger of left and right ring fingers 01/11/2021   Trigger finger of left hand 01/11/2021   Past Surgical History:  Procedure Laterality Date   APPENDECTOMY  1982   BREAST EXCISIONAL BIOPSY Right 1995   benign   BREAST LUMPECTOMY  1991   CARPAL TUNNEL RELEASE Right 2006   Right wrist   CESAREAN SECTION     x 2   CHOLECYSTECTOMY N/A 07/25/2020   Procedure: LAPAROSCOPIC CHOLECYSTECTOMY;  Surgeon: Almond Lint, MD;  Location: [facility] OR;  Service: General;  Laterality: N/A;   COLON RESECTION  1990   COLONOSCOPY     CT ABDOMEN PELVIS W ([facility] HX)  01/03/2020   IMAGE MRI brain:  04/2012   No focal IAC or inner ear lesion to explain hearing loss. Slightly greater than expected number of subcortical  T2- hyperintensities bilaterally.  These are nonspecific.  They can be seen in the setting of chronic migraine headaches, demyelinating process, chronic microvascular ischemic, prior infection or inflammation, or vasculitis   IMAGE MRI lumbar:  05/01/2006   Mild central canal stenosis with facet arthropathy and mildly bulging disc at L4-5.  There is mild narrowing in the left lateral recess at this level.  No definite neural impingement.  Appearance at this level has not markedly Moderately severe facet arthropathy at L5-S1 with mild interval progression of a diffuse broad based disc bulge.  There is mild biforaminal narrowing.  Central canal is open   LAPAROSCOPIC ABDOMINAL EXPLORATION  2019   adhesion removal> caused bowel blockage    NASAL SEPTUM SURGERY  2004   TONSILLECTOMY  1965   TOTAL ABDOMINAL  HYSTERECTOMY W/ BILATERAL SALPINGOOPHORECTOMY  1988   TUBAL LIGATION  1974   UPPER GASTROINTESTINAL ENDOSCOPY  2020    reports that she has never smoked. She has never used smokeless tobacco. She reports current alcohol use. She reports that she does not use drugs. family history includes Alcohol abuse in her brother and sister; Arthritis in her brother, brother, father, and mother; Asthma in her father; Brain cancer in her sister; Breast cancer in her cousin and maternal aunt; COPD in her brother, father, sister, sister, and sister; Cancer in her sister; Clotting disorder in her sister; Depression in her brother; Diabetes in her brother and father; Diabetes type II in her sister; Early death in her father, sister, and sister; Emphysema in her maternal aunt and maternal uncle; Hearing loss in her mother; Heart disease in her mother and sister; Hypercholesterolemia in her brother; Hyperlipidemia in her father and mother; Hypertension in her father, mother, and sister; Kidney disease in her mother; Lung cancer in her sister and another family member; Macular degeneration in her mother; Parkinson's disease in her father; Throat cancer in her brother; Thyroid cancer in her sister. Allergies  Allergen Reactions   Sulfamethoxazole-Trimethoprim Hives   Avelox [Moxifloxacin Hcl In Nacl]     Racing heart   Cephalexin Hives   Codeine Hives    Heart racing   Cymbalta [Duloxetine Hcl]    Erythromycin Hives   Flagyl [Metronidazole]     Unknown reaction   Paxlovid [Nirmatrelvir-Ritonavir]    Propranolol Hives   Sulfa Antibiotics Hives   Tetanus Toxoid Swelling    Reports a fever and headache with swelling.    Tramadol Hives   Statins Other (See Comments)    Myalgia       Outpatient Encounter Medications as of 02/20/2023  Medication Sig   albuterol (PROAIR HFA) 108 (90 Base) MCG/ACT inhaler Inhale 2 puffs into the lungs every 6 (six) hours as needed.   albuterol (PROVENTIL) (2.5 MG/3ML) 0.083%  nebulizer solution Take 3 mLs (2.5 mg total) by nebulization every 6 (six) hours as needed.   ARMOUR THYROID 15 MG tablet Take 1 tablet by mouth in the AM Once a day   ARMOUR THYROID 30 MG tablet Take 1 tablet by mouth in the AM once a day   Ascorbic Acid (VITAMIN C) 1000 MG tablet Take 3,000 mg by mouth daily.   atenolol (TENORMIN) 25 MG tablet Take 0.5-1 tablets (12.5-25 mg total) by mouth daily.   benzonatate (TESSALON) 200 MG capsule Take 1 capsule (200 mg total) by mouth 3 (three) times daily as needed for cough.   calcium carbonate (TUMS - DOSED IN MG ELEMENTAL CALCIUM) 500 MG chewable tablet Chew 3 tablets  by mouth daily as needed for indigestion or heartburn.   Carboxymethylcellulose Sodium (THERATEARS OP) Place 1 drop into both eyes 2 (two) times daily.   Cholecalciferol (VITAMIN D) 125 MCG (5000 UT) CAPS Take 5,000 Units by mouth daily.   clobetasol (TEMOVATE) 0.05 % external solution Apply 1 Application topically 2 (two) times daily.   clotrimazole (MYCELEX) 10 MG troche Take 1 tablet (10 mg total) by mouth 4 (four) times daily as needed.   Coenzyme Q10 (COQ-10) 100 MG CAPS Take 100 mg by mouth daily.   ezetimibe (ZETIA) 10 MG tablet Take 1 tablet (10 mg total) by mouth daily.   famotidine (PEPCID) 40 MG tablet TAKE 1 TABLET BY MOUTH TWICE A DAY   fexofenadine (ALLERGY RELIEF) 180 MG tablet Take 1 tablet (180 mg total) by mouth daily.   fluconazole (DIFLUCAN) 100 MG tablet Take 100 mg by mouth daily.   fluticasone (FLONASE) 50 MCG/ACT nasal spray Place 2 sprays into both nostrils daily.   fluticasone-salmeterol (WIXELA INHUB) 250-50 MCG/ACT AEPB Inhale 1 puff into the lungs in the morning and at bedtime.   folic acid (FOLVITE) 1 MG tablet SMARTSIG:2 Tablet(s) By Mouth   hydrochlorothiazide (MICROZIDE) 12.5 MG capsule Take 1 capsule (12.5 mg total) by mouth daily as needed.   hyoscyamine (LEVSIN) 0.125 MG tablet Take 1 tablet (0.125 mg total) by mouth every 6 (six) hours as needed.    mometasone (ELOCON) 0.1 % lotion Apply 1 application topically daily as needed (ear irritation).    montelukast (SINGULAIR) 10 MG tablet TAKE 1 TABLET BY MOUTH EVERYDAY AT BEDTIME   mupirocin ointment (BACTROBAN) 2 % Apply 1 Application topically 2 (two) times daily.   ondansetron (ZOFRAN-ODT) 4 MG disintegrating tablet Take 1 tablet by mouth every 4-6 hours   pantoprazole (PROTONIX) 40 MG tablet Take 1 tablet (40 mg total) by mouth 2 (two) times daily.   Probiotic CAPS Take 1 capsule by mouth daily.   rosuvastatin (CRESTOR) 10 MG tablet TAKE 1 TABLET BY MOUTH THREE TIMES A WEEK   tiZANidine (ZANAFLEX) 4 MG tablet Take 1 tablet (4 mg total) by mouth every 6 (six) hours as needed for muscle spasms.   valACYclovir (VALTREX) 1000 MG tablet 2 tabs at onset, rpt 2 tabs in 12 hours once.   VITAMIN A PO Take 1 tablet by mouth daily.   zinc gluconate 50 MG tablet Take 100 mg by mouth daily.   zolpidem (AMBIEN) 5 MG tablet Take 1 tablet (5 mg total) by mouth at bedtime. As needed   predniSONE (DELTASONE) 10 MG tablet 4 tabs for 2 days, then 3 tabs for 2 days, 2 tabs for 2 days, then 1 tab for 2 days, then stop   No facility-administered encounter medications on file as of 02/20/2023.     REVIEW OF SYSTEMS  : All other systems reviewed and negative except where noted in the History of Present Illness.   PHYSICAL EXAM: BP 130/76   Pulse 73   Ht 4\' 11"  (1.499 m)   Wt 134 lb (60.8 kg)   BMI 27.06 kg/m  General: Well developed white female in no acute distress Head: Normocephalic and atraumatic Eyes:  Sclerae anicteric, conjunctiva pink. Ears: Normal auditory acuity Lungs: Clear throughout to auscultation; no W/R/R. Heart: Regular rate and rhythm; no M/R/G. Abdomen: Soft, non-distended.  BS present.  Mild diffuse TTP. Musculoskeletal: Symmetrical with no gross deformities  Skin: No lesions on visible extremities Extremities: No edema  Neurological: Alert oriented x 4, grossly  non-focal Psychological:  Alert and cooperative. Normal mood and affect  ASSESSMENT AND PLAN: *72 year old female with complaints of generalized abdominal pain with frequent nausea and episodes of vomiting.  She has history of IBS and nausea that has been an ongoing issue for quite some time, but things seem to be worse recently.  Having more frequent nausea with episodes of vomiting.  Generalized abdominal pain that feels like it goes from her rectum all the way up into the center of her abdomen.  Will plan for CT scan of the abdomen and pelvis with contrast.  Will check BMP today.  Has a history of laparotomy with lysis of adhesions.  Likely has more adhesive disease/scar tissue.  CC:  Kuneff, Renee A, DO

## 2023-02-20 NOTE — Patient Instructions (Signed)
Your provider has requested that you go to the basement level for lab work before leaving today. Press "B" on the elevator. The lab is located at the first door on the left as you exit the elevator.   You have been scheduled for a CT scan of the abdomen and pelvis at [facility] 1st floor Radiology. You are scheduled on Tuesday 03/04/23 at 1 pm. Please arrive at 11 am to drink oral contrast.   Please follow the written instructions below on the day of your exam:   1) Do not eat anything after 9 am (4 hours prior to your test)   You may take any medications as prescribed with a small amount of water, if necessary. If you take any of the following medications: METFORMIN, GLUCOPHAGE, GLUCOVANCE, AVANDAMET, RIOMET, FORTAMET, ACTOPLUS MET, JANUMET, GLUMETZA or METAGLIP, you MAY be asked to HOLD this medication 48 hours AFTER the exam.   The purpose of you drinking the oral contrast is to aid in the visualization of your intestinal tract. The contrast solution may cause some diarrhea. Depending on your individual set of symptoms, you may also receive an intravenous injection of x-ray contrast/dye. Plan on being at [facility] for 45 minutes or longer, depending on the type of exam you are having performed.   If you have any questions regarding your exam or if you need to reschedule, you may call [facility] at [phone] between the hours of 8:00 am and 5:00 pm, Monday-Friday.

## 2023-02-26 NOTE — Progress Notes (Signed)
Reviewed and agree with documentation and assessment and plan. K. Veena Samuella Rasool , MD   

## 2023-03-04 ENCOUNTER — Ambulatory Visit (HOSPITAL_BASED_OUTPATIENT_CLINIC_OR_DEPARTMENT_OTHER)
Admission: RE | Admit: 2023-03-04 | Discharge: 2023-03-04 | Disposition: A | Discharge status: Home / Self Care | Payer: Medicare HMO | Source: Ambulatory Visit | Attending: Gastroenterology | Admitting: Gastroenterology

## 2023-03-04 ENCOUNTER — Encounter (HOSPITAL_BASED_OUTPATIENT_CLINIC_OR_DEPARTMENT_OTHER): Payer: Self-pay

## 2023-03-04 DIAGNOSIS — R109 Unspecified abdominal pain: Secondary | ICD-10-CM | POA: Diagnosis not present

## 2023-03-04 DIAGNOSIS — R1084 Generalized abdominal pain: Secondary | ICD-10-CM

## 2023-03-04 DIAGNOSIS — R1110 Vomiting, unspecified: Secondary | ICD-10-CM | POA: Diagnosis not present

## 2023-03-04 DIAGNOSIS — R112 Nausea with vomiting, unspecified: Secondary | ICD-10-CM | POA: Diagnosis not present

## 2023-03-04 DIAGNOSIS — I700 Atherosclerosis of aorta: Secondary | ICD-10-CM | POA: Diagnosis not present

## 2023-03-04 MED ORDER — IOHEXOL 300 MG/ML  SOLN
100.0000 mL | Freq: Once | INTRAMUSCULAR | Status: AC | PRN
  Administered 2023-03-04: 100 mL via INTRAVENOUS

## 2023-03-11 ENCOUNTER — Telehealth: Payer: Self-pay | Admitting: Gastroenterology

## 2023-03-12 NOTE — Telephone Encounter (Signed)
Dr Lavon Paganini can you please review the CT scan Shanda Bumps ordered for this pt?  Shanda Bumps is out of the office all week.  Thank you

## 2023-03-13 NOTE — Telephone Encounter (Signed)
The pt has been advised to keep her appt as planned and discuss EGD and COLON with Shanda Bumps.  The pt has been advised of the information and verbalized understanding.

## 2023-03-13 NOTE — Telephone Encounter (Signed)
Patient called to schedule OV regarding recent mychart messages. She is scheduled for the next available 8/20. Requesting a call back to discuss appointment since it is far out and she was instructed to have an endoscopy. She is also requesting to have colonoscopy done at the same time. Please advise, thank you.

## 2023-03-19 ENCOUNTER — Ambulatory Visit (INDEPENDENT_AMBULATORY_CARE_PROVIDER_SITE_OTHER): Payer: Medicare HMO | Admitting: Family Medicine

## 2023-03-19 ENCOUNTER — Encounter: Payer: Self-pay | Admitting: Family Medicine

## 2023-03-19 VITALS — BP 116/60 | HR 54 | Temp 98.0°F | Wt 137.0 lb

## 2023-03-19 DIAGNOSIS — N289 Disorder of kidney and ureter, unspecified: Secondary | ICD-10-CM | POA: Diagnosis not present

## 2023-03-19 LAB — URINALYSIS, ROUTINE W REFLEX MICROSCOPIC
Bilirubin Urine: NEGATIVE
Hgb urine dipstick: NEGATIVE
Ketones, ur: NEGATIVE
Leukocytes,Ua: NEGATIVE
Nitrite: NEGATIVE
Specific Gravity, Urine: <=1.005 — AB (ref 1.000–1.030)
Total Protein, Urine: NEGATIVE
Urine Glucose: NEGATIVE
Urobilinogen, UA: 0.2 (ref 0.0–1.0)
WBC, UA: NONE SEEN (ref 0–?)
pH: 7 (ref 5.0–8.0)

## 2023-03-19 NOTE — Progress Notes (Signed)
Rita Levine , 1950/10/24, 72 y.o., female MRN: [id number] Patient Care Team    Relationship Specialty Notifications Start End  Natalia Leatherwood, DO PCP - General Family Medicine  12/01/19   Corky Crafts, MD PCP - Cardiology Cardiology  01/26/20   Rachael Fee, MD Attending Physician Gastroenterology  12/01/19   Glyn Ade, PA-C Physician Assistant Dermatology  04/18/22   Daneen Schick, OD  Optometry  07/26/22     Chief Complaint  Patient presents with   kidney tumor    Pt states that GI found tumor on right kidney. Already has appt with urology July 3rd but wanted to talk to PCP in case she needed a referral.     Subjective: Rita Levine is a 72 y.o. Pt presents for an OV to discuss abnormal right kidney finding that was incidentally found by GI with GI workup/CT abdomen.  A interpolar right renal lesion was visualized and was too small to characterize.  Patient has been having rather extensive abdominal discomfort, but has no urinary complaints today.  She already has an appointment scheduled with for [facility] on July 3, which had been arranged by her gastroenterology team.  There is no formal referral placed in the system.  03/04/2023 CT abd/pelvis: "Adrenals/Urinary Tract: Normal adrenal glands. Interpolar right renal too small to characterize lesion . In the absence of clinically indicated signs/symptoms require(s) no independent follow-up. Normal left kidney. No hydronephrosis. Normal urinary bladder."     10/02/2022    8:21 AM 06/27/2022    1:17 PM 04/24/2022    8:50 AM 11/06/2021   11:04 AM 06/12/2021    6:06 PM  Depression screen PHQ 2/9  Decreased Interest 0 0 0 0 0  Down, Depressed, Hopeless 0 0 0 0 0  PHQ - 2 Score 0 0 0 0 0  Altered sleeping   0 0   Tired, decreased energy   3 1   Change in appetite   1 0   Feeling bad or failure about yourself    0 0   Trouble concentrating   1 0   Moving slowly or fidgety/restless   0 0    Suicidal thoughts   0 0   PHQ-9 Score   5 1     Allergies  Allergen Reactions   Sulfamethoxazole-Trimethoprim Hives   Avelox [Moxifloxacin Hcl In Nacl]     Racing heart   Cephalexin Hives   Codeine Hives    Heart racing   Cymbalta [Duloxetine Hcl]    Erythromycin Hives   Flagyl [Metronidazole]     Unknown reaction   Paxlovid [Nirmatrelvir-Ritonavir]    Propranolol Hives   Sulfa Antibiotics Hives   Tetanus Toxoid Swelling    Reports a fever and headache with swelling.    Tramadol Hives   Statins Other (See Comments)    Myalgia    Social History   Social History Narrative   Marital status/children/pets: married, 2 children   Education/employment: HS grad. retired   Field seismologist:      -smoke alarm in the home:Yes     - wears seatbelt: Yes     - Feels safe in their relationships: Yes   Past Medical History:  Diagnosis Date   Allergic rhinitis    Allergic rhinitis due to pollen 12/03/2019   Allergy    Anemia    Asthma    Cataract    Chest pain, unspecified 04/07/2014   Chicken pox  Colon polyp    Diverticulosis    Dysphagia    Dysrhythmia    Palpitations   Fibromyalgia    Food allergy    Gallstone    GERD (gastroesophageal reflux disease)    Hiatal hernia    Hypercholesteremia    Hypertension    Hypothyroidism    IBS (irritable bowel syndrome)    Ingrown toenail 08/23/2020   Migraine headache    occasionally   Morton neuroma, 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   Osteoarthritis    Osteopenia    Pneumonia    PONV (postoperative nausea and vomiting)    Pulmonary hypertension (HCC)    pt denies, pt states she was tested for it but was not diagnosed with it   Rectal bleeding    SBO (small bowel obstruction) (HCC) 10/20/2017   Seasonal allergies    Subconjunctival hemorrhage of left eye 08/01/2022   Thyroid nodule    Trigger finger of left and right ring fingers 01/11/2021   Trigger finger of left hand 01/11/2021   Past Surgical History:  Procedure Laterality Date    APPENDECTOMY  1982   BREAST EXCISIONAL BIOPSY Right 1995   benign   BREAST LUMPECTOMY  1991   CARPAL TUNNEL RELEASE Right 2006   Right wrist   CESAREAN SECTION     x 2   CHOLECYSTECTOMY N/A 07/25/2020   Procedure: LAPAROSCOPIC CHOLECYSTECTOMY;  Surgeon: Almond Lint, MD;  Location: [facility] OR;  Service: General;  Laterality: N/A;   COLON RESECTION  1990   COLONOSCOPY     CT ABDOMEN PELVIS W ([facility] HX)  01/03/2020   IMAGE MRI brain:  04/2012   No focal IAC or inner ear lesion to explain hearing loss. Slightly greater than expected number of subcortical T2- hyperintensities bilaterally.  These are nonspecific.  They can be seen in the setting of chronic migraine headaches, demyelinating process, chronic microvascular ischemic, prior infection or inflammation, or vasculitis   IMAGE MRI lumbar:  05/01/2006   Mild central canal stenosis with facet arthropathy and mildly bulging disc at L4-5.  There is mild narrowing in the left lateral recess at this level.  No definite neural impingement.  Appearance at this level has not markedly Moderately severe facet arthropathy at L5-S1 with mild interval progression of a diffuse broad based disc bulge.  There is mild biforaminal narrowing.  Central canal is open   LAPAROSCOPIC ABDOMINAL EXPLORATION  2019   adhesion removal> caused bowel blockage    NASAL SEPTUM SURGERY  2004   TONSILLECTOMY  1965   TOTAL ABDOMINAL HYSTERECTOMY W/ BILATERAL SALPINGOOPHORECTOMY  1988   TUBAL LIGATION  1974   UPPER GASTROINTESTINAL ENDOSCOPY  2020   Family History  Problem Relation Age of Onset   Hyperlipidemia Father    Hypertension Father    Arthritis Father    Diabetes Father    Asthma Father    COPD Father    Early death Father    Parkinson's disease Father    Hypertension Mother    Hyperlipidemia Mother    Macular degeneration Mother    Arthritis Mother    Hearing loss Mother    Heart disease Mother    Kidney disease Mother    Throat cancer Brother         lung, and tongue   Arthritis Brother    COPD Brother    Diabetes type II Sister    COPD Sister    Heart disease Sister    Hypertension Sister    Thyroid cancer Sister  Lung cancer Sister    Early death Sister    COPD Sister    Breast cancer Maternal Aunt    Lung cancer Other        uncle   Emphysema Maternal Aunt    Emphysema Maternal Uncle    Clotting disorder Sister    Brain cancer Sister        brain tumor- not malignant   Breast cancer Cousin    Cancer Sister    Alcohol abuse Sister    COPD Sister    Early death Sister    Alcohol abuse Brother    Arthritis Brother    Depression Brother    Diabetes Brother    Hypercholesterolemia Brother    Colon cancer Neg Hx    Esophageal cancer Neg Hx    Rectal cancer Neg Hx    Stomach cancer Neg Hx    Allergies as of 03/19/2023       Reactions   Sulfamethoxazole-trimethoprim Hives   Avelox [moxifloxacin Hcl In Nacl]    Racing heart   Cephalexin Hives   Codeine Hives   Heart racing   Cymbalta [duloxetine Hcl]    Erythromycin Hives   Flagyl [metronidazole]    Unknown reaction   Paxlovid [nirmatrelvir-ritonavir]    Propranolol Hives   Sulfa Antibiotics Hives   Tetanus Toxoid Swelling   Reports a fever and headache with swelling.    Tramadol Hives   Statins Other (See Comments)   Myalgia        Medication List        Accurate as of March 19, 2023 10:43 AM. If you have any questions, ask your nurse or doctor.          albuterol 108 (90 Base) MCG/ACT inhaler Commonly known as: ProAir HFA Inhale 2 puffs into the lungs every 6 (six) hours as needed.   albuterol (2.5 MG/3ML) 0.083% nebulizer solution Commonly known as: PROVENTIL Take 3 mLs (2.5 mg total) by nebulization every 6 (six) hours as needed.   Armour Thyroid 15 MG tablet Generic drug: thyroid Take 1 tablet by mouth in the AM Once a day   Armour Thyroid 30 MG tablet Generic drug: thyroid Take 1 tablet by mouth in the AM once a day   atenolol 25  MG tablet Commonly known as: TENORMIN Take 0.5-1 tablets (12.5-25 mg total) by mouth daily.   benzonatate 200 MG capsule Commonly known as: TESSALON Take 1 capsule (200 mg total) by mouth 3 (three) times daily as needed for cough.   calcium carbonate 500 MG chewable tablet Commonly known as: TUMS - dosed in mg elemental calcium Chew 3 tablets by mouth daily as needed for indigestion or heartburn.   clobetasol 0.05 % external solution Commonly known as: TEMOVATE Apply 1 Application topically 2 (two) times daily.   clotrimazole 10 MG troche Commonly known as: MYCELEX Take 1 tablet (10 mg total) by mouth 4 (four) times daily as needed.   CoQ-10 100 MG Caps Take 100 mg by mouth daily.   ezetimibe 10 MG tablet Commonly known as: ZETIA Take 1 tablet (10 mg total) by mouth daily.   famotidine 40 MG tablet Commonly known as: PEPCID TAKE 1 TABLET BY MOUTH TWICE A DAY   fexofenadine 180 MG tablet Commonly known as: Allergy Relief Take 1 tablet (180 mg total) by mouth daily.   fluconazole 100 MG tablet Commonly known as: DIFLUCAN Take 100 mg by mouth daily.   fluticasone 50 MCG/ACT nasal spray Commonly known  as: FLONASE Place 2 sprays into both nostrils daily.   fluticasone-salmeterol 250-50 MCG/ACT Aepb Commonly known as: Wixela Inhub Inhale 1 puff into the lungs in the morning and at bedtime.   folic acid 1 MG tablet Commonly known as: FOLVITE SMARTSIG:2 Tablet(s) By Mouth   hydrochlorothiazide 12.5 MG capsule Commonly known as: MICROZIDE Take 1 capsule (12.5 mg total) by mouth daily as needed.   hyoscyamine 0.125 MG tablet Commonly known as: LEVSIN Take 1 tablet (0.125 mg total) by mouth every 6 (six) hours as needed.   mometasone 0.1 % lotion Commonly known as: ELOCON Apply 1 application topically daily as needed (ear irritation).   montelukast 10 MG tablet Commonly known as: SINGULAIR TAKE 1 TABLET BY MOUTH EVERYDAY AT BEDTIME   mupirocin ointment 2  % Commonly known as: BACTROBAN Apply 1 Application topically 2 (two) times daily.   ondansetron 4 MG disintegrating tablet Commonly known as: ZOFRAN-ODT Take 1 tablet by mouth every 4-6 hours   pantoprazole 40 MG tablet Commonly known as: PROTONIX Take 1 tablet (40 mg total) by mouth 2 (two) times daily.   predniSONE 10 MG tablet Commonly known as: DELTASONE 4 tabs for 2 days, then 3 tabs for 2 days, 2 tabs for 2 days, then 1 tab for 2 days, then stop   Probiotic Caps Take 1 capsule by mouth daily.   rosuvastatin 10 MG tablet Commonly known as: CRESTOR TAKE 1 TABLET BY MOUTH THREE TIMES A WEEK   THERATEARS OP Place 1 drop into both eyes 2 (two) times daily.   tiZANidine 4 MG tablet Commonly known as: ZANAFLEX Take 1 tablet (4 mg total) by mouth every 6 (six) hours as needed for muscle spasms.   valACYclovir 1000 MG tablet Commonly known as: VALTREX 2 tabs at onset, rpt 2 tabs in 12 hours once.   VITAMIN A PO Take 1 tablet by mouth daily.   vitamin C 1000 MG tablet Take 3,000 mg by mouth daily.   Vitamin D 125 MCG (5000 UT) Caps Take 5,000 Units by mouth daily.   zinc gluconate 50 MG tablet Take 100 mg by mouth daily.   zolpidem 5 MG tablet Commonly known as: AMBIEN Take 1 tablet (5 mg total) by mouth at bedtime. As needed        All past medical history, surgical history, allergies, family history, immunizations andmedications were updated in the EMR today and reviewed under the history and medication portions of their EMR.     ROS Negative, with the exception of above mentioned in HPI   Objective:  BP 116/60   Pulse (!) 54   Temp 98 F (36.7 C)   Wt 137 lb (62.1 kg)   SpO2 98%   BMI 27.67 kg/m  Body mass index is 27.67 kg/m. Physical Exam Vitals and nursing note reviewed.  Constitutional:      General: She is not in acute distress.    Appearance: Normal appearance. She is normal weight. She is not ill-appearing or toxic-appearing.  HENT:      Head: Normocephalic and atraumatic.  Eyes:     General: No scleral icterus.       Right eye: No discharge.        Left eye: No discharge.     Extraocular Movements: Extraocular movements intact.     Conjunctiva/sclera: Conjunctivae normal.     Pupils: Pupils are equal, round, and reactive to light.  Cardiovascular:     Rate and Rhythm: Normal rate and regular rhythm.  Abdominal:     Tenderness: There is no right CVA tenderness or left CVA tenderness.  Musculoskeletal:     Right lower leg: No edema.     Left lower leg: No edema.  Skin:    Findings: No rash.  Neurological:     Mental Status: She is alert and oriented to person, place, and time. Mental status is at baseline.     Motor: No weakness.     Coordination: Coordination normal.     Gait: Gait normal.  Psychiatric:        Mood and Affect: Mood normal.        Behavior: Behavior normal.        Thought Content: Thought content normal.        Judgment: Judgment normal.     No results found. No results found. No results found for this or any previous visit (from the past 24 hour(s)).  Assessment/Plan: Rita Levine is a 72 y.o. female present for OV for  Kidney lesion, native, right Discussed CT findings ordered by GI.  She has a right renal lesion that is too small to characterize.  She has an appointment set [facility] with [facility] in 1 month. -I did place a formal referral for insurance purposes today -There is no personal or family history of kidney cancer patient has never been a smoker. -Will collect urinalysis today to see if there is any evidence of hematuria or other abnormality. - Urinalysis, Routine w reflex microscopic - Ambulatory referral to Urology-appointment July 3  Reviewed expectations re: course of current medical issues. Discussed self-management of symptoms. Outlined signs and symptoms indicating need for more acute intervention. Patient verbalized understanding and all questions were  answered. Patient received an After-Visit Summary.    Orders Placed This Encounter  Procedures   Urinalysis, Routine w reflex microscopic   No orders of the defined types were placed in this encounter.  Referral Orders  No referral(s) requested today     Note is dictated utilizing voice recognition software. Although note has been proof read prior to signing, occasional typographical errors still can be missed. If any questions arise, please do not hesitate to call for verification.   electronically signed by:  Felix Pacini, DO  [facility] - [place]

## 2023-03-26 ENCOUNTER — Other Ambulatory Visit: Payer: Self-pay | Admitting: Internal Medicine

## 2023-03-26 ENCOUNTER — Other Ambulatory Visit: Payer: Self-pay | Admitting: Nurse Practitioner

## 2023-03-26 ENCOUNTER — Other Ambulatory Visit: Payer: Self-pay | Admitting: Physician Assistant

## 2023-03-26 ENCOUNTER — Other Ambulatory Visit: Payer: Self-pay | Admitting: Family Medicine

## 2023-04-02 ENCOUNTER — Ambulatory Visit: Payer: Medicare HMO | Admitting: Nurse Practitioner

## 2023-04-03 ENCOUNTER — Telehealth: Payer: Self-pay

## 2023-04-03 NOTE — Telephone Encounter (Signed)
Patient calling about CT scan and referral.  She may end up cancelling referral to urology just because of spot so small.  She is wondering for when it is time next year to get another CT to check on spot - could she just come see Dr. Claiborne Billings  to order CT or would she have to go back to GI to get them to order repeat CT?  She is aware Dr. Claiborne Billings is out of office.  Please follow up with patient upon her return and her recommendations.  [phone]

## 2023-04-04 NOTE — Telephone Encounter (Signed)
Please Advise

## 2023-04-07 ENCOUNTER — Other Ambulatory Visit: Payer: Self-pay

## 2023-04-07 NOTE — Telephone Encounter (Signed)
Requesting: zolpidem Contract: 11/06/21 UDS: N/A Last Visit: 03/19/23, acute; 10/02/22 [facility] Next Visit: 07/09/23 [facility] Last Refill: 10/02/22 (90,1)  Pt needs appt with provider for [facility]

## 2023-04-10 NOTE — Telephone Encounter (Signed)
Pt advised of recommendations for referral. Arthritis medication was last discussed during 09/2022 follow up.  Please further advise.

## 2023-04-10 NOTE — Telephone Encounter (Signed)
I would encourage her to keep the referral to urology and follow their advice on additional testing/future testing etc. This is their specialty and would give her the best available advice on the subject. I know the lesion appeared small and it was an incidental finding through GI, but she should really follow through with the specialty team to be safe.

## 2023-04-10 NOTE — Telephone Encounter (Signed)
Patient states she also wanted to inquire about possible alternatives for arthritis that will not have possible side effects she had from previous medications. Please discuss with patient if  is something she can take to help with arthritis pain.

## 2023-04-14 ENCOUNTER — Encounter: Payer: Self-pay | Admitting: Nurse Practitioner

## 2023-04-14 ENCOUNTER — Ambulatory Visit: Payer: Medicare HMO | Admitting: Nurse Practitioner

## 2023-04-14 VITALS — BP 142/70 | HR 61 | Temp 98.2°F | Ht 59.0 in | Wt 135.0 lb

## 2023-04-14 DIAGNOSIS — R058 Other specified cough: Secondary | ICD-10-CM | POA: Diagnosis not present

## 2023-04-14 DIAGNOSIS — J302 Other seasonal allergic rhinitis: Secondary | ICD-10-CM

## 2023-04-14 DIAGNOSIS — J45909 Unspecified asthma, uncomplicated: Secondary | ICD-10-CM | POA: Diagnosis not present

## 2023-04-14 NOTE — Progress Notes (Signed)
@Patient  ID: Rita Levine, female    DOB: [birth year], 72 y.o.   MRN: [id number]  Chief Complaint  Patient presents with   Follow-up    Pt states she has been doing good. Still hoarse occ cough. Pt has been using wixela inhaler     Referring provider: Natalia Leatherwood, DO  HPI: 72 year old female, never smoker followed for hx of asthma, chronic cough.  She is a patient of Dr. Thurston Hole and was last seen in office on 12/02/2022 by [facility] NP.  Past medical history significant for hypertension, aortic atherosclerosis, CAD, pulmonary hypertension, GERD, IBS, hypothyroidism, HLD, fibromyalgia, allergic rhinitis  TEST/EVENTS:  01/15/2021 CT chest with contrast: moderate aortic atherosclerosis. Occasional coronary artery calcifications. Tiny hiatal hernia. Linear atelectasis/scar in the lingula, similar to prior exam. 3 mm pleural based nodule in the right upper lobe, likely benign. 04/28/2021 CXR 1 view: Lung volumes normal.  Pulmonary vasculature and the cardiomediastinal silhouette are within normal limits. 10/25/2021: Allergen panel negative, eos 100 05/30/2022 PFT: FVC 93, FEV1 115, ratio 94, TLC 88, DLCOcor 117. No BD 06/03/2022 CT chest w con: subcm nodes in mediastinum with no significant change. 3 mm pleural based nodule in RUL with no significant change. No acute process. Fatty infiltration of the liver. Atherosclerosis.   08/02/2021: OV with Dr. Sherene Sires.  Initial consult for chronic cough since having pneumonia in 2019.  No report of dyspnea.  Was previously placed on Symbicort 80 and was also using [place] twice a day.  Cough worse with Symbicort -suspected upper airway cough syndrome.  Recommended stopping Symbicort on trial basis.  Aggressive treatment of GERD with Protonix 40 mg twice a day and Pepcid 20 mg in the evening.  Albuterol and budesonide nebs up to twice a day if needed.  6-day prednisone taper.  09/12/2021: OV with Tymon Nemetz NP for 6-week follow-up.  Did not notice major change in her symptoms.   She did experience some shortness of breath and chest tightness after burning some trash in her yard and working in the attic earlier in the week.  Felt some improvement but had not returned to baseline.  Persistent cough with primarily clear sputum production.  Allergy symptoms uncontrolled and currently being treated by ENT.  Felt GERD symptoms improved with twice daily PPI.  Increased ICS budesonide nebs to 0.5 mg.  Prednisone taper.  10/25/2021: OV with Kassidy Frankson NP for 6 week follow up. She continues to experience persistent cough with clear sputum production. She occasionally experiences minimal shortness of breath upon exertion and with coughing spells. She continues to have post nasal drip and difficulties with her chronic sinusitis which she is scheduled to follow up with ENT this week to discuss. She also notes occasional breakthrough reflux, despite Twice daily PPI and pepcid nightly. She elevates the head of her bed and avoids triggering foods. She did notice some improvement in her symptoms with the prednisone taper and reports that her albuterol rescue inhaler does relieve her shortness of breath. FeNO nl. Persistent productive cough and minimal SOB with exertion. Suspect uncontrolled chronic sinusitis and GERD contributing. Hx of asthma - spirometry 2012 normal; no reports of wheezing. Slight improvement with prednisone. Trial triple therapy inhaler with Breztri 2 puffs Twice daily. Stop nebs. PRN albuterol. Instructed to follow up with ENT as scheduled. Advised to follow up with GI to discuss breakthrough GERD as she is already on Twice daily PPI and H2 once daily - may need EGD for further eval. Continue Singulair, Xyzal, saline nasal  rinses. PFTs ordered today. Allergen panel and CBC with diff.   05/08/2022: OV with Gabrella Stroh NP for overdue follow-up.  She is still having a persistent chronic cough, which is possibly slightly worse than it was previously.  She has not noticed a huge change..  Usually it is  nonproductive but occasionally she will produce clear to yellow sputum, which is usually in the morning.  Breathing is unchanged ; will get short of breath with strenuous activity or climbing stairs.  She also has persistent hoarseness, which she feels like is worse than it has been in the past.  She has chronic sinusitis and uses multiple nasal rinses.  She was seen by ENT after her last visit but only discussed a spot on her tongue and did not discuss her hoarseness or her persistent chronic sinusitis.  Feels like her GERD symptoms are better controlled.  She denies any wheezing, increased shortness of breath, orthopnea, PND, hemoptysis, weight loss, anorexia, fatigue, dysphagia.  She was previously trialed on step up to [place].  Did not notice a huge difference when back to low-dose Symbicort.  Uses this with a spacer.  Never completed her PFTs because she did not want to get COVID tested again.  Her PCP has ordered a CT chest for evaluation of the previously identified small nodule. CXR with bronchitic changes - treated with doxy and prednisone.  05/30/2022: OV with Perez Dirico NP for follow up after undergoing pulmonary function testing, which was overall normal. She reports she has been doing well since I saw her last. Still has an occasional dry cough but it is stable. Her breathing is unchanged. Unsure if the last course of prednisone and antibiotics did much for her. She denies any recent wheezing, chest congestion, fevers, night seats, hemoptysis, anorexia, weight loss. She is on Symbicort Twice daily. Rarely uses albuterol. She still has some issues with postnasal drainage and nasal congestion. Due to see ENT next week. She uses flonase, xyzal, and singulair. Does nasal irrigations daily. Denies any headaches or purulent drainage. She has CT chest ordered by her PCP for nodule follow up next week.   12/02/2022: Ov with Donabelle Molden NP for intended follow up; however, she is also having some acute symptoms which  started around a month ago. She has had a lot of sinus congestion with green mucus. She's been coughing more frequently, mostly dry during the day but productive in the AM with similar colored sputum. She feels like she's had more chest tightness, wheezing, and has been getting a little more winded. She denies fevers, chills, hemoptysis, leg swelling, facial tenderness She's using her rescue inhaler multiple times a week, which does help. She is still using Symbicort twice daily, singulai, Xyzal, and nasal irrigation/sprays as directed by ENT.  04/14/2023: Today - follow up Patient presents today for follow up. She has been doing well since she was here last for the most part. The heat gives her a little more trouble but that's typical for this time of year. Her cough is at her baseline, primarily dry. She has some occasional voice hoarseness, which has not changed. No wheezing or chest congestion.   Allergies  Allergen Reactions   Sulfamethoxazole-Trimethoprim Hives   Avelox [Moxifloxacin Hcl In Nacl]     Racing heart   Cephalexin Hives   Codeine Hives    Heart racing   Cymbalta [Duloxetine Hcl]    Erythromycin Hives   Flagyl [Metronidazole]     Unknown reaction   Paxlovid [Nirmatrelvir-Ritonavir]  Propranolol Hives   Sulfa Antibiotics Hives   Tetanus Toxoid Swelling    Reports a fever and headache with swelling.    Tramadol Hives   Statins Other (See Comments)    Myalgia     Immunization History  Administered Date(s) Administered   Influenza-Unspecified 06/11/2019, 07/14/2020   Pneumococcal Conjugate-13 07/03/2016   Pneumococcal Polysaccharide-23 08/03/2019   Tdap 05/02/2014   Zoster, Live 10/14/2010    Past Medical History:  Diagnosis Date   Allergic rhinitis    Allergic rhinitis due to pollen 12/03/2019   Allergy    Anemia    Asthma    Cataract    Chest pain, unspecified 04/07/2014   Chicken pox    Colon polyp    Diverticulosis    Dysphagia    Dysrhythmia     Palpitations   Fibromyalgia    Food allergy    Gallstone    GERD (gastroesophageal reflux disease)    Hiatal hernia    Hypercholesteremia    Hypertension    Hypothyroidism    IBS (irritable bowel syndrome)    Ingrown toenail 08/23/2020   Migraine headache    occasionally   Morton neuroma, 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   Osteoarthritis    Osteopenia    Pneumonia    PONV (postoperative nausea and vomiting)    Pulmonary hypertension (HCC)    pt denies, pt states she was tested for it but was not diagnosed with it   Rectal bleeding    SBO (small bowel obstruction) (HCC) 10/20/2017   Seasonal allergies    Subconjunctival hemorrhage of left eye 08/01/2022   Thyroid nodule    Trigger finger of left and right ring fingers 01/11/2021    Tobacco History: Social History   Tobacco Use  Smoking Status Never   Passive exposure: Past  Smokeless Tobacco Never   Counseling given: Not Answered   Outpatient Medications Prior to Visit  Medication Sig Dispense Refill   albuterol (PROAIR HFA) 108 (90 Base) MCG/ACT inhaler Inhale 2 puffs into the lungs every 6 (six) hours as needed. 6.7 g 5   albuterol (PROVENTIL) (2.5 MG/3ML) 0.083% nebulizer solution Take 3 mLs (2.5 mg total) by nebulization every 6 (six) hours as needed. 75 mL 0   ARMOUR THYROID 15 MG tablet Take 1 tablet by mouth in the AM Once a day 90 tablet 3   ARMOUR THYROID 30 MG tablet Take 1 tablet by mouth in the AM once a day 90 tablet 3   Ascorbic Acid (VITAMIN C) 1000 MG tablet Take 3,000 mg by mouth daily.     atenolol (TENORMIN) 25 MG tablet Take 0.5-1 tablets (12.5-25 mg total) by mouth daily. 90 tablet 1   benzonatate (TESSALON) 200 MG capsule Take 1 capsule (200 mg total) by mouth 3 (three) times daily as needed for cough. 30 capsule 1   budesonide (PULMICORT) 0.5 MG/2ML nebulizer solution TAKE 2 MLS (0.5 MG TOTAL) BY NEBULIZATION IN THE MORNING AND AT BEDTIME. 120 mL 6   calcium carbonate (TUMS - DOSED IN MG ELEMENTAL CALCIUM) 500  MG chewable tablet Chew 3 tablets by mouth daily as needed for indigestion or heartburn.     Carboxymethylcellulose Sodium (THERATEARS OP) Place 1 drop into both eyes 2 (two) times daily.     Cholecalciferol (VITAMIN D) 125 MCG (5000 UT) CAPS Take 5,000 Units by mouth daily.     clobetasol (TEMOVATE) 0.05 % external solution Apply 1 Application topically 2 (two) times daily. 50 mL 2   clotrimazole (MYCELEX)  10 MG troche Take 1 tablet (10 mg total) by mouth 4 (four) times daily as needed. 24 tablet 3   Coenzyme Q10 (COQ-10) 100 MG CAPS Take 100 mg by mouth daily.     ezetimibe (ZETIA) 10 MG tablet Take 1 tablet (10 mg total) by mouth daily. 90 tablet 3   famotidine (PEPCID) 40 MG tablet TAKE 1 TABLET BY MOUTH TWICE A DAY 180 tablet 1   fexofenadine (ALLERGY RELIEF) 180 MG tablet Take 1 tablet (180 mg total) by mouth daily. 90 tablet 1   fluconazole (DIFLUCAN) 100 MG tablet Take 100 mg by mouth daily.     fluticasone (FLONASE) 50 MCG/ACT nasal spray Place 2 sprays into both nostrils daily. 16 mL 11   fluticasone-salmeterol (WIXELA INHUB) 250-50 MCG/ACT AEPB Inhale 1 puff into the lungs in the morning and at bedtime. 60 each 5   folic acid (FOLVITE) 1 MG tablet SMARTSIG:2 Tablet(s) By Mouth     hydrochlorothiazide (MICROZIDE) 12.5 MG capsule TAKE 1 CAPSULE (12.5 MG TOTAL) BY MOUTH DAILY AS NEEDED. 30 capsule 0   hyoscyamine (LEVSIN) 0.125 MG tablet Take 1 tablet (0.125 mg total) by mouth every 6 (six) hours as needed. 120 tablet 5   mometasone (ELOCON) 0.1 % lotion Apply 1 application topically daily as needed (ear irritation).      montelukast (SINGULAIR) 10 MG tablet TAKE 1 TABLET BY MOUTH EVERYDAY AT BEDTIME 90 tablet 1   mupirocin ointment (BACTROBAN) 2 % Apply 1 Application topically 2 (two) times daily. 30 g 2   ondansetron (ZOFRAN-ODT) 4 MG disintegrating tablet Take 1 tablet by mouth every 4-6 hours 30 tablet 3   ondansetron (ZOFRAN-ODT) 8 MG disintegrating tablet TAKE 1 TABLET BY MOUTH EVERY  8 HOURS AS NEEDED FOR NAUSEA OR VOMITING. 30 tablet 1   pantoprazole (PROTONIX) 40 MG tablet Take 1 tablet (40 mg total) by mouth 2 (two) times daily. 180 tablet 3   Probiotic CAPS Take 1 capsule by mouth daily.     rosuvastatin (CRESTOR) 10 MG tablet TAKE 1 TABLET BY MOUTH THREE TIMES A WEEK 36 tablet 2   tiZANidine (ZANAFLEX) 4 MG tablet Take 1 tablet (4 mg total) by mouth every 6 (six) hours as needed for muscle spasms. 120 tablet 5   valACYclovir (VALTREX) 1000 MG tablet 2 tabs at onset, rpt 2 tabs in 12 hours once. 20 tablet 1   VITAMIN A PO Take 1 tablet by mouth daily.     zinc gluconate 50 MG tablet Take 100 mg by mouth daily.     zolpidem (AMBIEN) 5 MG tablet Take 1 tablet (5 mg total) by mouth at bedtime. As needed 90 tablet 1   No facility-administered medications prior to visit.     Review of Systems:   Constitutional: No weight loss or gain, night sweats, fevers, chills, fatigue, or lassitude. HEENT: No headaches, difficulty swallowing, tooth/dental problems, or sore throat. No sneezing, itching, ear ache +baseline nasal congestion/drainage CV:  No chest pain, orthopnea, PND, swelling in lower extremities, anasarca, dizziness, palpitations, syncope Resp: +shortness of breath with exertion (baseline); chronic cough. No wheeze. No hemoptysis.  No chest wall deformity GI:  No heartburn, indigestion, abdominal pain, nausea, vomiting, diarrhea, change in bowel habits, loss of appetite, bloody stools.  GU: No dysuria, change in color of urine, urgency or frequency.   Skin: No rash, lesions, ulcerations MSK:  No joint pain or swelling.   Neuro: No dizziness or lightheadedness.  Psych: No depression or anxiety. Mood stable.  Physical Exam:  BP (!) 142/70   Pulse 61   Temp 98.2 F (36.8 C) (Oral)   Ht 4\' 11"  (1.499 m)   Wt 135 lb (61.2 kg)   SpO2 99%   BMI 27.27 kg/m   GEN: Pleasant, interactive, well-nourished; in no acute distress. HEENT:  Normocephalic and  atraumatic. EACs patent bilaterally. TM pearly gray with present light reflex bilaterally. PERRLA. Sclera white. Nasal turbinates pink, moist and patent bilaterally.  Right nasal polyp.  No rhinorrhea present. Oropharynx pink and moist, without exudate or edema. No lesions, ulcerations NECK:  Supple w/ fair ROM. No JVD present. Normal carotid impulses w/o bruits. Thyroid symmetrical with no goiter or nodules palpated. No lymphadenopathy.   CV: RRR, no m/r/g, no peripheral edema. Pulses intact, +2 bilaterally. No cyanosis, pallor or clubbing. PULMONARY:  Unlabored, regular breathing. Clear bilaterally A&P w/o wheezes/rales/rhonchi. No accessory muscle use. No dullness to percussion. GI: BS present and normoactive. Soft, non-tender to palpation. No organomegaly or masses detected.  MSK: No erythema, warmth or tenderness. Cap refil <2 sec all extrem. No deformities or joint swelling noted.  Neuro: A/Ox3. No focal deficits noted.   Skin: Warm, no lesions or rashe Psych: Normal affect and behavior. Judgement and thought content appropriate.     Lab Results:  CBC    Component Value Date/Time   WBC 5.5 04/24/2022 0840   RBC 4.54 04/24/2022 0840   HGB 13.1 04/24/2022 0840   HCT 38.8 04/24/2022 0840   PLT 236.0 04/24/2022 0840   MCV 85.4 04/24/2022 0840   MCH 28.3 04/28/2021 0934   MCHC 33.8 04/24/2022 0840   RDW 14.1 04/24/2022 0840   LYMPHSABS 1.4 10/25/2021 0950   MONOABS 0.7 10/25/2021 0950   EOSABS 0.1 10/25/2021 0950   BASOSABS 0.1 10/25/2021 0950    BMET    Component Value Date/Time   NA 140 02/20/2023 0954   NA 138 01/04/2021 1001   K 4.0 02/20/2023 0954   CL 100 02/20/2023 0954   CO2 31 02/20/2023 0954   GLUCOSE 119 (H) 02/20/2023 0954   BUN 6 02/20/2023 0954   BUN 12 01/04/2021 1001   CREATININE 0.72 02/20/2023 0954   CREATININE 0.71 07/26/2022 1641   CALCIUM 9.8 02/20/2023 0954   GFRNONAA >60 04/28/2021 0934    BNP No results found for: "BNP"   Imaging:  No  results found.       Latest Ref Rng & Units 05/30/2022    9:59 AM  PFT Results  FVC-Pre L 2.22   FVC-Predicted Pre % 93   FVC-Post L 2.30   FVC-Predicted Post % 96   Pre FEV1/FVC % % 93   Post FEV1/FCV % % 94   FEV1-Pre L 2.05   FEV1-Predicted Pre % 115   FEV1-Post L 2.15   DLCO uncorrected ml/min/mmHg 18.86   DLCO UNC% % 116   DLCO corrected ml/min/mmHg 19.03   DLCO COR %Predicted % 117   DLVA Predicted % 123   TLC L 3.80   TLC % Predicted % 88   RV % Predicted % 68     No results found for: "NITRICOXIDE"      Assessment & Plan:   Asthma, intrinsic Compensated on current regimen. Last exacerbation February 2024. No hospitalizations. Action plan in place.  Patient Instructions  -Continue Advair 2 puffs Twice daily. Brush tongue and rinse mouth afterwards. Use with spacer.  -Continue albuterol inhaler 2 puffs or 3 mL nebulizer every 6 hours as needed for shortness of  breath or cough -Continue sinuglair 10 mg At bedtime -Continue protonix 40 mg Twice daily -Continue pepcid 10 mg daily  -Continue Xyzal 5 mg At bedtime  -Continue nasal irrigation treatments per ENT -Continue flonase 2 sprays each nostril Twice daily for nasal congestion/drainage  -Continue Chlortab 4 mg over the counter At bedtime for cough -Continue mucinex over the counter     Follow up in 4 months with Dr. Sherene Sires or Katie Ulys Favia,NP. If symptoms worsen, please contact office for sooner follow up or seek emergency care.   Upper airway cough syndrome Stable. See above  Seasonal allergies Stable on current regimen    Noemi Chapel, NP 04/14/2023  Pt aware and understands NP's role.

## 2023-04-14 NOTE — Telephone Encounter (Signed)
Pt given recommendations

## 2023-04-14 NOTE — Telephone Encounter (Signed)
If patient has additional questions from an appointment in December concerning her arthritis, we would need to have her at least make a virtual visit so that we can take the time to look back through her chart concerning what medications have been tried and reaction. As well as discuss any further medications that are possible depending upon that discussion.

## 2023-04-14 NOTE — Assessment & Plan Note (Signed)
Stable. See above.

## 2023-04-14 NOTE — Patient Instructions (Addendum)
-  Continue Advair 2 puffs Twice daily. Brush tongue and rinse mouth afterwards. Use with spacer.  -Continue albuterol inhaler 2 puffs or 3 mL nebulizer every 6 hours as needed for shortness of breath or cough -Continue sinuglair 10 mg At bedtime -Continue protonix 40 mg Twice daily -Continue pepcid 10 mg daily  -Continue Xyzal 5 mg At bedtime  -Continue nasal irrigation treatments per ENT -Continue flonase 2 sprays each nostril Twice daily for nasal congestion/drainage  -Continue Chlortab 4 mg over the counter At bedtime for cough -Continue mucinex over the counter     Follow up in 4 months with Dr. Sherene Sires or Katie Emmaly Leech,NP. If symptoms worsen, please contact office for sooner follow up or seek emergency care.

## 2023-04-14 NOTE — Assessment & Plan Note (Signed)
Compensated on current regimen. Last exacerbation February 2024. No hospitalizations. Action plan in place.  Patient Instructions  -Continue Advair 2 puffs Twice daily. Brush tongue and rinse mouth afterwards. Use with spacer.  -Continue albuterol inhaler 2 puffs or 3 mL nebulizer every 6 hours as needed for shortness of breath or cough -Continue sinuglair 10 mg At bedtime -Continue protonix 40 mg Twice daily -Continue pepcid 10 mg daily  -Continue Xyzal 5 mg At bedtime  -Continue nasal irrigation treatments per ENT -Continue flonase 2 sprays each nostril Twice daily for nasal congestion/drainage  -Continue Chlortab 4 mg over the counter At bedtime for cough -Continue mucinex over the counter     Follow up in 4 months with Dr. Sherene Sires or Katie Elvyn Krohn,NP. If symptoms worsen, please contact office for sooner follow up or seek emergency care.

## 2023-04-14 NOTE — Assessment & Plan Note (Signed)
Stable on current regimen   

## 2023-04-16 DIAGNOSIS — N281 Cyst of kidney, acquired: Secondary | ICD-10-CM | POA: Diagnosis not present

## 2023-04-18 ENCOUNTER — Other Ambulatory Visit: Payer: Self-pay | Admitting: Family Medicine

## 2023-04-18 ENCOUNTER — Other Ambulatory Visit (INDEPENDENT_AMBULATORY_CARE_PROVIDER_SITE_OTHER): Payer: Medicare HMO

## 2023-04-18 ENCOUNTER — Ambulatory Visit (INDEPENDENT_AMBULATORY_CARE_PROVIDER_SITE_OTHER): Payer: Medicare HMO | Admitting: Sports Medicine

## 2023-04-18 DIAGNOSIS — M65331 Trigger finger, right middle finger: Secondary | ICD-10-CM | POA: Diagnosis not present

## 2023-04-18 DIAGNOSIS — M65351 Trigger finger, right little finger: Secondary | ICD-10-CM

## 2023-04-18 DIAGNOSIS — M65352 Trigger finger, left little finger: Secondary | ICD-10-CM | POA: Diagnosis not present

## 2023-04-18 DIAGNOSIS — M65311 Trigger thumb, right thumb: Secondary | ICD-10-CM

## 2023-04-18 DIAGNOSIS — M65332 Trigger finger, left middle finger: Secondary | ICD-10-CM

## 2023-04-18 DIAGNOSIS — M65341 Trigger finger, right ring finger: Secondary | ICD-10-CM | POA: Diagnosis not present

## 2023-04-18 DIAGNOSIS — M65322 Trigger finger, left index finger: Secondary | ICD-10-CM

## 2023-04-18 DIAGNOSIS — M65342 Trigger finger, left ring finger: Secondary | ICD-10-CM | POA: Diagnosis not present

## 2023-04-18 DIAGNOSIS — M65321 Trigger finger, right index finger: Secondary | ICD-10-CM

## 2023-04-18 DIAGNOSIS — M65312 Trigger thumb, left thumb: Secondary | ICD-10-CM | POA: Diagnosis not present

## 2023-04-18 NOTE — Assessment & Plan Note (Signed)
Increasing triggering, right second and fourth, left first injections today. Last injections were in February 2024, right fourth. Return as needed.

## 2023-04-18 NOTE — Progress Notes (Signed)
    Procedures performed today:    Procedure: Real-time Ultrasound Guided injection of the left fourth flexor tendon sheath Device: Samsung HS60  Verbal informed consent obtained.  Time-out conducted.  Noted no overlying erythema, induration, or other signs of local infection.  Skin prepped in a sterile fashion.  Local anesthesia: Topical Ethyl chloride.  With sterile technique and under real time ultrasound guidance: Noted flexor tendon nodule, 1/2 cc lidocaine, 1/2 cc kenalog 40 injected easily. Completed without difficulty  Advised to call if fevers/chills, erythema, induration, drainage, or persistent bleeding.  Images permanently stored and available for review in PACS.  Impression: Technically successful ultrasound guided injection.  Procedure: Real-time Ultrasound Guided injection of the left second flexor tendon sheath Device: Samsung HS60  Verbal informed consent obtained.  Time-out conducted.  Noted no overlying erythema, induration, or other signs of local infection.  Skin prepped in a sterile fashion.  Local anesthesia: Topical Ethyl chloride.  With sterile technique and under real time ultrasound guidance: Noted flexor tendon nodule, 1/2 cc lidocaine, 1/2 cc kenalog 40 injected easily. Completed without difficulty  Advised to call if fevers/chills, erythema, induration, drainage, or persistent bleeding.  Images permanently stored and available for review in PACS.  Impression: Technically successful ultrasound guided injection.  Procedure: Real-time Ultrasound Guided injection of the right second flexor tendon sheath Device: Samsung HS60  Verbal informed consent obtained.  Time-out conducted.  Noted no overlying erythema, induration, or other signs of local infection.  Skin prepped in a sterile fashion.  Local anesthesia: Topical Ethyl chloride.  With sterile technique and under real time ultrasound guidance: Noted flexor tendon nodule, 1/2 cc lidocaine, 1/2 cc  kenalog 40 injected easily. Completed without difficulty  Advised to call if fevers/chills, erythema, induration, drainage, or persistent bleeding.  Images permanently stored and available for review in PACS.  Impression: Technically successful ultrasound guided injection.  Independent interpretation of notes and tests performed by another provider:   None.  Brief History, Exam, Impression, and Recommendations:    Trigger finger of all digits of both hands Increasing triggering, right second and fourth, left first injections today. Last injections were in February 2024, right fourth. Return as needed.    ____________________________________________ Rita Levine. Benjamin Stain, M.D., ABFM., CAQSM., AME. Primary Care and Sports Medicine [facility]  Adjunct Professor of Family Medicine  [employer]  Restaurant manager, fast food

## 2023-04-22 ENCOUNTER — Other Ambulatory Visit: Payer: Self-pay | Admitting: Family Medicine

## 2023-04-25 ENCOUNTER — Ambulatory Visit: Payer: Medicare HMO | Admitting: Physician Assistant

## 2023-04-26 IMAGING — RF DG ESOPHAGUS
7 of 8 series · 14 of 24 positions shown · non-contrast
Comparison: NONE.

CLINICAL DATA: Sensation of food becoming stuck in the throat,
history of treatment for GERD

EXAM:
ESOPHAGUS/BARIUM SWALLOW/TABLET STUDY
TECHNIQUE: Combined double and single contrast examination was performed using
effervescent crystals, high-density barium, and thin liquid barium.
This exam was performed by Rascon, Jarid, and was supervised
and interpreted by Tiger, Chai.
FLUOROSCOPY:
Radiation Exposure Index (as provided by the fluoroscopic device):
63.10 mGy
Fluoroscopy time: 2 minute 6 seconds could down

[Series 1: cp_standard · 2 of 51 frames shown (1 of 4)]
[frame 8/51]
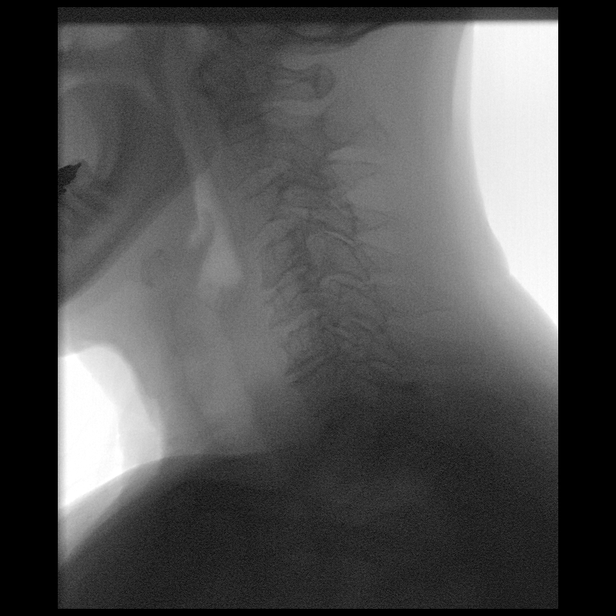
[frame 44/51]
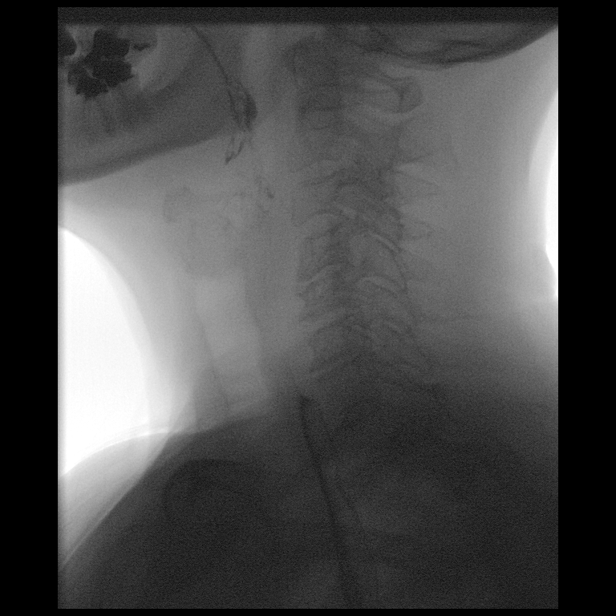

[Series 2: fluoro_barium 2fps_bw · 1 of 17 frames shown (1 of 3)]
[frame 10/17]
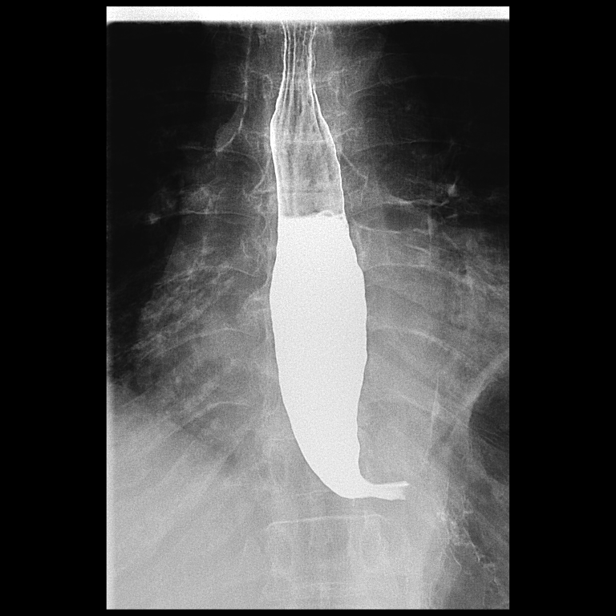

[Series 3: fluoro_barium 2fps_bw · 2 of 23 frames shown (2 of 3)]
[frame 3/23]
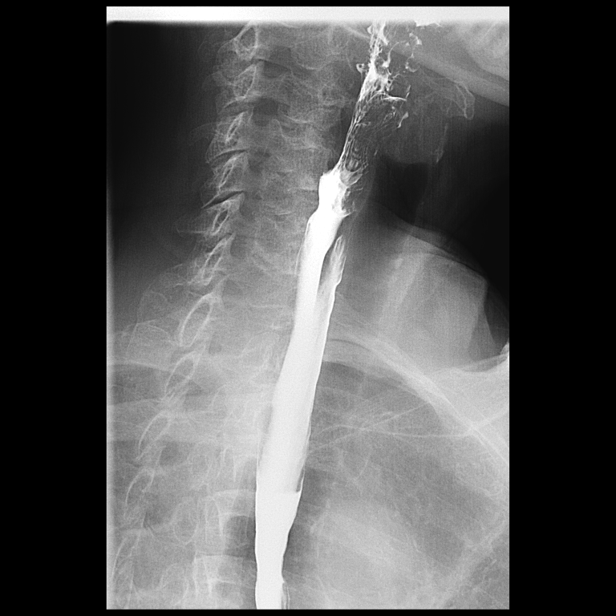
[frame 4/23]
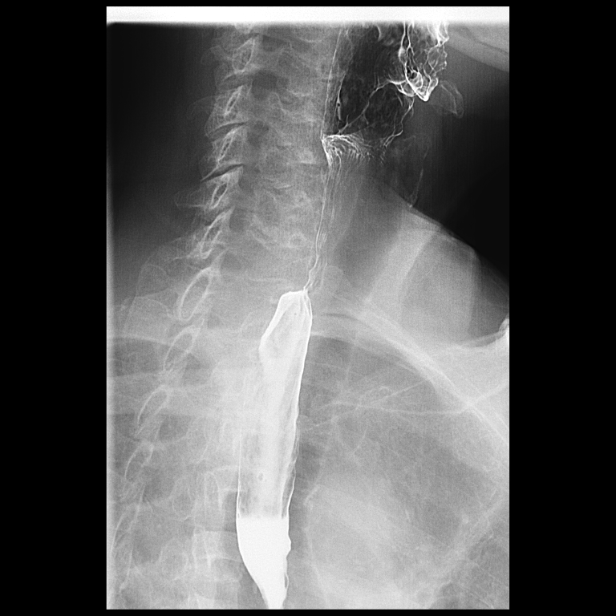

[Series 4: fluoro_barium 2fps_bw · 3 of 8 frames shown (3 of 3)]
[frame 2/8]
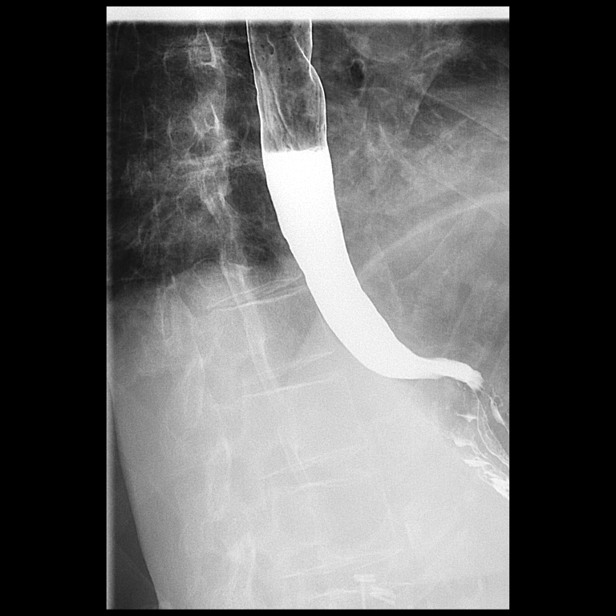
[frame 5/8]
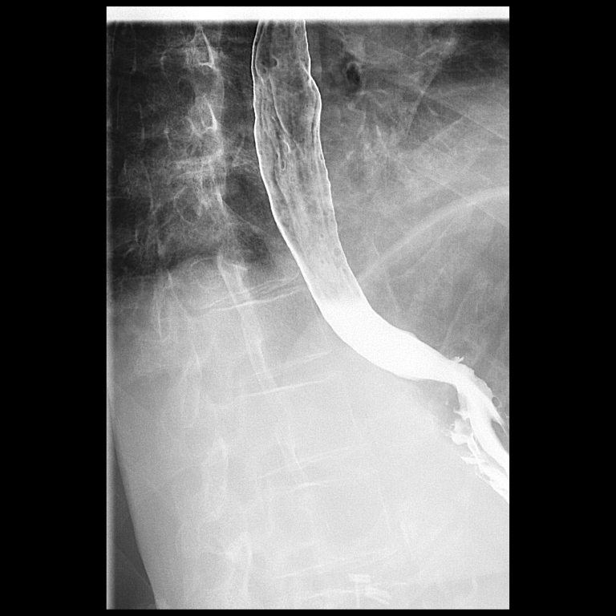
[frame 7/8]
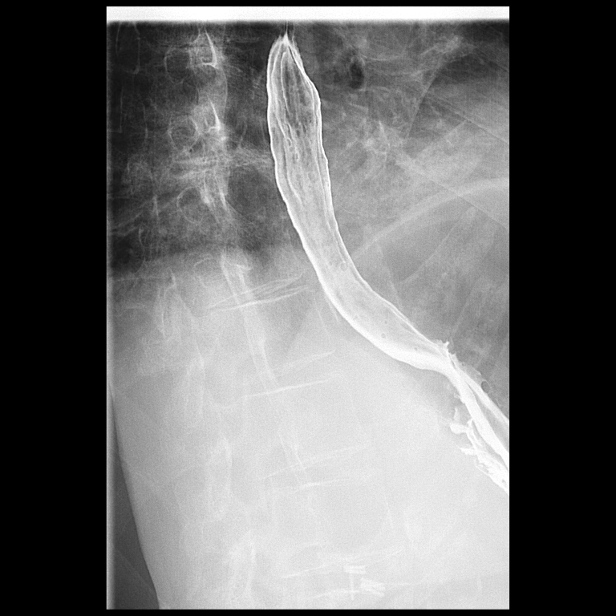

[Series 5: cp_standard · 1 of 125 frames shown (2 of 4)]
[frame 63/125]
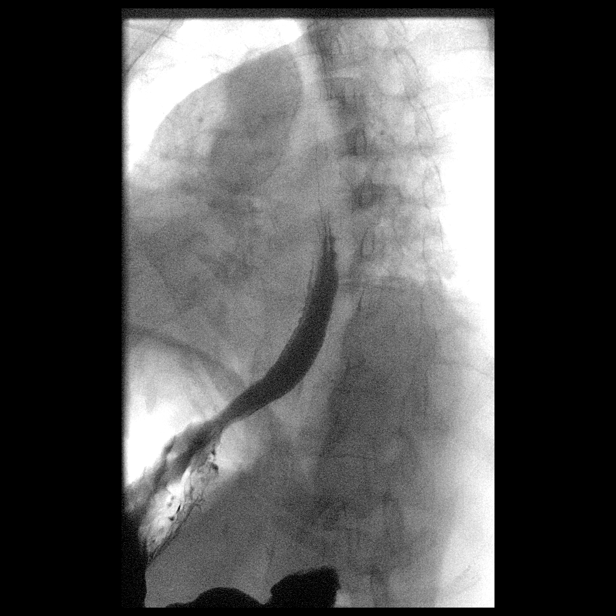

[Series 6: cp_standard · 3 of 179 frames shown (3 of 4)]
[frame 27/179]
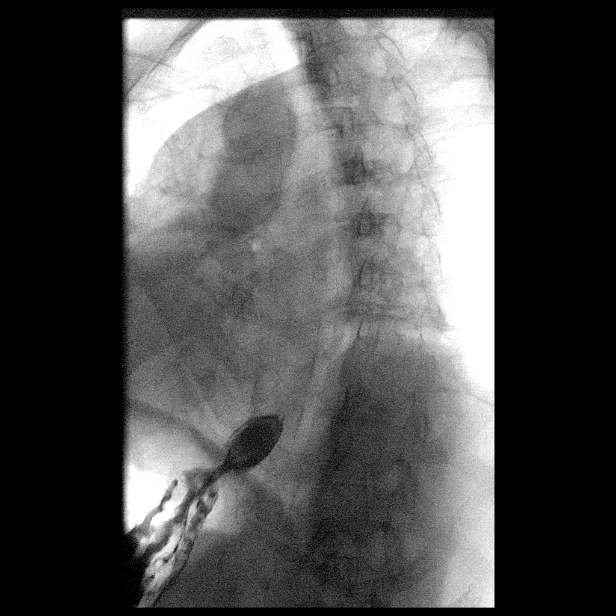
[frame 153/179]
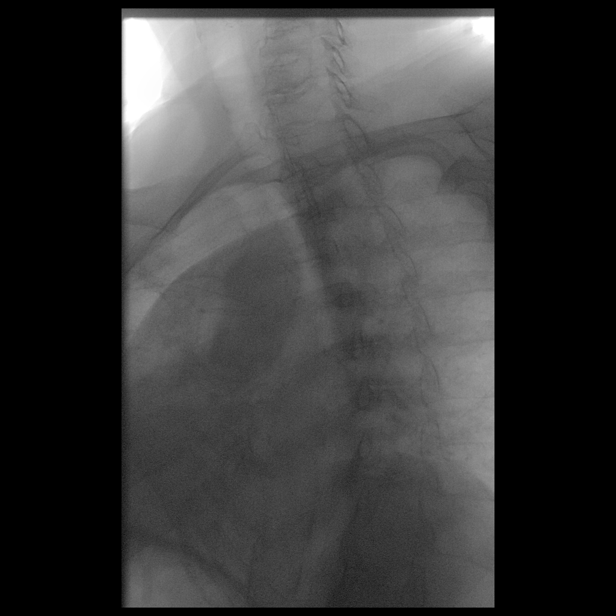
[frame 174/179  full-range]
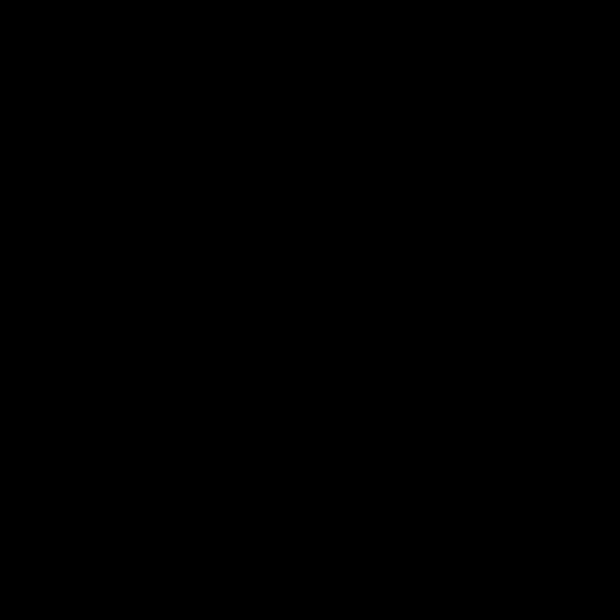

[Series 8: cp_standard · 2 of 73 frames shown (4 of 4)]
[frame 11/73]
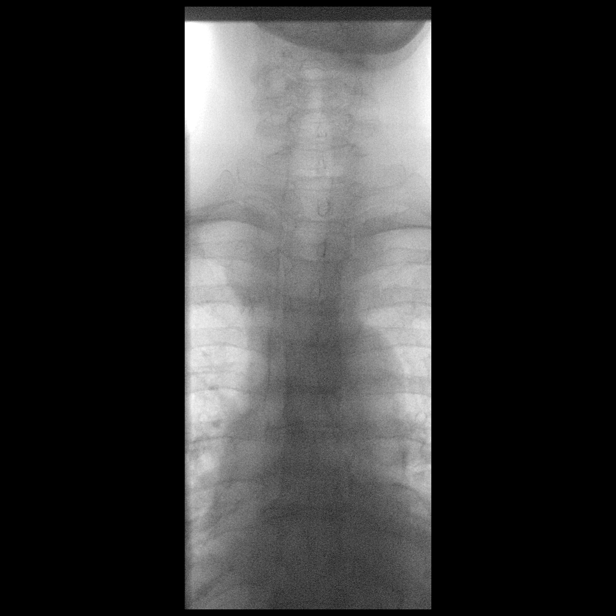
[frame 63/73]
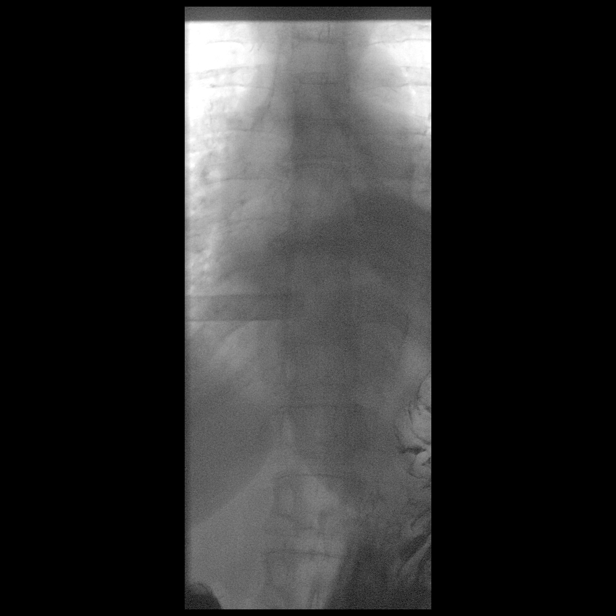

[14 of 24 positions shown; findings below may reference images not displayed]

FINDINGS: Swallowing: Appears normal. No vestibular penetration or aspiration
seen.

Pharynx: Nonspecific and nonobstructive impressions on the posterior
esophagus are present from adjacent osteophytic formations of the
cervical spine.

Esophagus: Normal appearance.

Esophageal motility: Numerous tertiary contractions are present
during upright swallows. Bolus passes smoothly through the esophagus
in prone RAO swallow, some background tertiary in secondary
peristalsis along the primary wave.

Hiatal Hernia: None.

Gastroesophageal reflux: None visualized.

Ingested 13mm barium tablet: Passed normally.

Other: None.
IMPRESSION: Numerous tertiary contractions present with gravity dependent
swallows but less apparent during prone RAO swallow suggestive of
atypical variation of dysmotility.

## 2023-04-28 ENCOUNTER — Telehealth: Payer: Self-pay | Admitting: Family Medicine

## 2023-04-28 NOTE — Telephone Encounter (Signed)
Spoke with patient regarding results/recommendations.  

## 2023-04-28 NOTE — Telephone Encounter (Signed)
Patient has contacted her pharmacy [facility] in [place] and they have made several attempts to have her medication filled. Please give the patient a call when this has been sent into [facility].  zolpidem (AMBIEN) 5 MG tablet

## 2023-04-29 ENCOUNTER — Ambulatory Visit (INDEPENDENT_AMBULATORY_CARE_PROVIDER_SITE_OTHER): Payer: Medicare HMO | Admitting: Family Medicine

## 2023-04-29 ENCOUNTER — Encounter: Payer: Self-pay | Admitting: Family Medicine

## 2023-04-29 VITALS — BP 130/73 | HR 66 | Temp 97.9°F

## 2023-04-29 DIAGNOSIS — M8589 Other specified disorders of bone density and structure, multiple sites: Secondary | ICD-10-CM

## 2023-04-29 DIAGNOSIS — I2510 Atherosclerotic heart disease of native coronary artery without angina pectoris: Secondary | ICD-10-CM | POA: Diagnosis not present

## 2023-04-29 DIAGNOSIS — E039 Hypothyroidism, unspecified: Secondary | ICD-10-CM | POA: Diagnosis not present

## 2023-04-29 DIAGNOSIS — I700 Atherosclerosis of aorta: Secondary | ICD-10-CM | POA: Diagnosis not present

## 2023-04-29 DIAGNOSIS — K219 Gastro-esophageal reflux disease without esophagitis: Secondary | ICD-10-CM

## 2023-04-29 DIAGNOSIS — E782 Mixed hyperlipidemia: Secondary | ICD-10-CM | POA: Diagnosis not present

## 2023-04-29 DIAGNOSIS — M797 Fibromyalgia: Secondary | ICD-10-CM

## 2023-04-29 DIAGNOSIS — J45909 Unspecified asthma, uncomplicated: Secondary | ICD-10-CM | POA: Diagnosis not present

## 2023-04-29 DIAGNOSIS — I10 Essential (primary) hypertension: Secondary | ICD-10-CM | POA: Diagnosis not present

## 2023-04-29 DIAGNOSIS — J302 Other seasonal allergic rhinitis: Secondary | ICD-10-CM

## 2023-04-29 DIAGNOSIS — K588 Other irritable bowel syndrome: Secondary | ICD-10-CM

## 2023-04-29 DIAGNOSIS — G479 Sleep disorder, unspecified: Secondary | ICD-10-CM

## 2023-04-29 DIAGNOSIS — M1990 Unspecified osteoarthritis, unspecified site: Secondary | ICD-10-CM

## 2023-04-29 DIAGNOSIS — I2584 Coronary atherosclerosis due to calcified coronary lesion: Secondary | ICD-10-CM

## 2023-04-29 LAB — TSH: TSH: 1.2 u[IU]/mL (ref 0.35–5.50)

## 2023-04-29 LAB — COMPREHENSIVE METABOLIC PANEL
ALT: 14 U/L (ref 0–35)
AST: 16 U/L (ref 0–37)
Albumin: 4.3 g/dL (ref 3.5–5.2)
Alkaline Phosphatase: 89 U/L (ref 39–117)
BUN: 10 mg/dL (ref 6–23)
CO2: 29 mEq/L (ref 19–32)
Calcium: 9.8 mg/dL (ref 8.4–10.5)
Chloride: 99 mEq/L (ref 96–112)
Creatinine, Ser: 0.68 mg/dL (ref 0.40–1.20)
GFR: 87.3 mL/min (ref >60.00)
Glucose, Bld: 151 mg/dL — ABNORMAL HIGH (ref 70–99)
Potassium: 3.7 mEq/L (ref 3.5–5.1)
Sodium: 136 mEq/L (ref 135–145)
Total Bilirubin: 0.6 mg/dL (ref 0.2–1.2)
Total Protein: 6.6 g/dL (ref 6.0–8.3)

## 2023-04-29 LAB — CBC WITH DIFFERENTIAL/PLATELET
Basophils Absolute: 0.1 10*3/uL (ref 0.0–0.1)
Basophils Relative: 0.9 % (ref 0.0–3.0)
Eosinophils Absolute: 0.1 10*3/uL (ref 0.0–0.7)
Eosinophils Relative: 0.9 % (ref 0.0–5.0)
HCT: 40.7 % (ref 36.0–46.0)
Hemoglobin: 13.4 g/dL (ref 12.0–15.0)
Lymphocytes Relative: 22 % (ref 12.0–46.0)
Lymphs Abs: 1.6 10*3/uL (ref 0.7–4.0)
MCHC: 33.1 g/dL (ref 30.0–36.0)
MCV: 83.5 fl (ref 78.0–100.0)
Monocytes Absolute: 0.7 10*3/uL (ref 0.1–1.0)
Monocytes Relative: 9.7 % (ref 3.0–12.0)
Neutro Abs: 4.9 10*3/uL (ref 1.4–7.7)
Neutrophils Relative %: 66.5 % (ref 43.0–77.0)
Platelets: 274 10*3/uL (ref 150.0–400.0)
RBC: 4.87 Mil/uL (ref 3.87–5.11)
RDW: 13.8 % (ref 11.5–15.5)
WBC: 7.4 10*3/uL (ref 4.0–10.5)

## 2023-04-29 LAB — LDL CHOLESTEROL, DIRECT: Direct LDL: 84 mg/dL

## 2023-04-29 MED ORDER — TIZANIDINE HCL 4 MG PO TABS: 4.0000 mg | ORAL_TABLET | Freq: Four times a day (QID) | ORAL | 5 refills | Status: DC | PRN

## 2023-04-29 MED ORDER — PANTOPRAZOLE SODIUM 40 MG PO TBEC: 40.0000 mg | DELAYED_RELEASE_TABLET | Freq: Two times a day (BID) | ORAL | 3 refills | Status: DC

## 2023-04-29 MED ORDER — ATENOLOL 25 MG PO TABS: 12.5000 mg | ORAL_TABLET | Freq: Every day | ORAL | 1 refills | Status: DC

## 2023-04-29 MED ORDER — ZOLPIDEM TARTRATE 5 MG PO TABS: 5.0000 mg | ORAL_TABLET | Freq: Every day | ORAL | 1 refills | Status: DC

## 2023-04-29 MED ORDER — HYDROCHLOROTHIAZIDE 12.5 MG PO CAPS: 12.5000 mg | ORAL_CAPSULE | Freq: Every day | ORAL | 1 refills | Status: DC | PRN

## 2023-04-29 MED ORDER — MONTELUKAST SODIUM 10 MG PO TABS: ORAL_TABLET | ORAL | 1 refills | Status: DC

## 2023-04-29 MED ORDER — FEXOFENADINE HCL 180 MG PO TABS: 180.0000 mg | ORAL_TABLET | Freq: Every day | ORAL | 1 refills | Status: DC

## 2023-04-29 MED ORDER — FLUTICASONE PROPIONATE 50 MCG/ACT NA SUSP: 2.0000 | Freq: Every day | NASAL | 11 refills | Status: DC

## 2023-04-29 NOTE — Progress Notes (Signed)
Patient ID: Rita Levine, female  DOB: 1951-01-26, 72 y.o.   MRN: [id number] Patient Care Team    Relationship Specialty Notifications Start End  Natalia Leatherwood, DO PCP - General Family Medicine  12/01/19   Corky Crafts, MD PCP - Cardiology Cardiology  01/26/20   Rachael Fee, MD Attending Physician Gastroenterology  12/01/19   Suzi Roots Physician Assistant Dermatology  04/18/22   Daneen Schick, OD  Optometry  07/26/22     Chief Complaint  Patient presents with   Hypothyroidism    Subjective: GRAVIELA Levine is a 72 y.o.  Female  present for Chronic Conditions/illness Management All past medical history, surgical history, allergies, family history, immunizations, medications and social history were updated in the electronic medical record today. All recent labs, ED visits and hospitalizations within the last year were reviewed.   Gastroesophageal reflux disease, unspecified whether esophagitis present Patient reports symptoms are controlled  on omeprazole and Pepcid.   Essential hypertension/HLD/Statin declined/Palpitations/Obesity (BMI 30-39.9)/statin intolerance Pt reports compliance with atenolol use of Maxide or losartan half tab if needed only.  Blood pressures ranges at home within normal limits.  Patient denies chest pain, shortness of breath, dizziness or lower extremity edema.  Pt does not daily baby ASA. Pt is not prescribed statin due to intolerance.   Fibromyalgia/osteoarthritis, unspecified osteoarthritis type, unspecified site/ DDD (degenerative disc disease), lumbar/Spinal stenosis of lumbar region, unspecified whether neurogenic claudication present Patient reports her fibromyalgia and arthritis symptoms are well-controlled on tylenol, tumeric and Zanaflex.    Seasonal allergies/Allergic rhinitis due to pollen, unspecified seasonality/ Multiple food allergies She reports allergies are well-controlled on Xyzal nightly, Singulair nightly and  Allegra as needed in the day. Prior note: Patient reports her allergies have always been rather uncontrolled.  When she lived in a different state she had food allergy testing and was allergic to many foods.  She at one time had allergy shots and this is when her allergies were the best as far as control.  She reports frequent occurrence of sinus infections and sinus headaches.  She had an MRI in 2013.  Her current regimen consist of Xyzal, Allegra, Singulair and Flonase.  She has taking Zyrtec in the past.  She reports the Xyzal was added last year but she does not feel its been helpful.   irritable bowel syndrome She reports an extensive history of diverticulitis requiring colon resection and later exploratory lap of the abdomen with removal of adhesions for short bowel obstruction.  SHe has continued irritable bowel symptoms which are well-controlled on Levsin as needed.  She is established with gastroenterology.  Last colonoscopy 2017, with Dr. Christella Hartigan   Asthma, intrinsic Patient reports her asthma is well-controlled on Symbicort.  She still rarely needs to use her albuterol inhaler.   Hypothyroidism, unspecified type/Thyroid nodule She reports compliance with Armour 45 mg total dose.  Labs are up-to-date.  [facility] due to cost. Prior note: Thyroid nodule history.  Last ultrasound 08/17/2018 with left thyroid nodule.  Per radiology report 1 year follow-up was recommended.  Patient does endorse mild compression-like symptoms.  She states her prior PCP had ordered follow-up, but it was canceled secondary to Covid pandemic.  Patient had thyroid ultrasound completed at [facility] radiology in [place].  Sleep disturbance: Pt reports ambien 5 mg at bedtime is working well for her.      04/29/2023   10:07 AM 10/02/2022  8:21 AM 06/27/2022    1:17 PM 04/24/2022    8:50 AM 11/06/2021   11:04 AM  Depression screen PHQ 2/9  Decreased  Interest 0 0 0 0 0  Down, Depressed, Hopeless 0 0 0 0 0  PHQ - 2 Score 0 0 0 0 0  Altered sleeping    0 0  Tired, decreased energy    3 1  Change in appetite    1 0  Feeling bad or failure about yourself     0 0  Trouble concentrating    1 0  Moving slowly or fidgety/restless    0 0  Suicidal thoughts    0 0  PHQ-9 Score    5 1    Immunization History  Administered Date(s) Administered   Influenza-Unspecified 06/11/2019, 07/14/2020   Pneumococcal Conjugate-13 07/03/2016   Pneumococcal Polysaccharide-23 08/03/2019   Tdap 05/02/2014   Zoster, Live 10/14/2010    Past Medical History:  Diagnosis Date   Allergic rhinitis    Allergic rhinitis due to pollen 12/03/2019   Allergy    Anemia    Asthma    Cataract    Chest pain, unspecified 04/07/2014   Chicken pox    Colon polyp    Diverticulosis    Dysphagia    Dysrhythmia    Palpitations   Fibromyalgia    Food allergy    Gallstone    GERD (gastroesophageal reflux disease)    Hiatal hernia    Hypercholesteremia    Hypertension    Hypothyroidism    IBS (irritable bowel syndrome)    Ingrown toenail 08/23/2020   Migraine headache    occasionally   Morton neuroma, 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   Osteoarthritis    Osteopenia    Pneumonia    PONV (postoperative nausea and vomiting)    Pulmonary hypertension (HCC)    pt denies, pt states she was tested for it but was not diagnosed with it   Rectal bleeding    SBO (small bowel obstruction) (HCC) 10/20/2017   Seasonal allergies    Subconjunctival hemorrhage of left eye 08/01/2022   Thyroid nodule    Trigger finger of left and right ring fingers 01/11/2021   Allergies  Allergen Reactions   Sulfamethoxazole-Trimethoprim Hives   Avelox [Moxifloxacin Hcl In Nacl]     Racing heart   Cephalexin Hives   Codeine Hives    Heart racing   Cymbalta [Duloxetine Hcl]    Erythromycin Hives   Flagyl [Metronidazole]     Unknown reaction   Paxlovid [Nirmatrelvir-Ritonavir]    Propranolol  Hives   Sulfa Antibiotics Hives   Tetanus Toxoid Swelling    Reports a fever and headache with swelling.    Tramadol Hives   Statins Other (See Comments)    Myalgia    Past Surgical History:  Procedure Laterality Date   APPENDECTOMY  1982   BREAST EXCISIONAL BIOPSY Right 1995   benign   BREAST LUMPECTOMY  1991   CARPAL TUNNEL RELEASE Right 2006   Right wrist   CESAREAN SECTION     x 2   CHOLECYSTECTOMY N/A 07/25/2020   Procedure: LAPAROSCOPIC CHOLECYSTECTOMY;  Surgeon: Almond Lint, MD;  Location: [facility] OR;  Service: General;  Laterality: N/A;   COLON RESECTION  1990   COLONOSCOPY     CT ABDOMEN PELVIS W ([facility] HX)  01/03/2020   IMAGE MRI brain:  04/2012   No focal IAC or inner ear lesion to explain hearing loss. Slightly greater than expected number of  subcortical T2- hyperintensities bilaterally.  These are nonspecific.  They can be seen in the setting of chronic migraine headaches, demyelinating process, chronic microvascular ischemic, prior infection or inflammation, or vasculitis   IMAGE MRI lumbar:  05/01/2006   Mild central canal stenosis with facet arthropathy and mildly bulging disc at L4-5.  There is mild narrowing in the left lateral recess at this level.  No definite neural impingement.  Appearance at this level has not markedly Moderately severe facet arthropathy at L5-S1 with mild interval progression of a diffuse broad based disc bulge.  There is mild biforaminal narrowing.  Central canal is open   LAPAROSCOPIC ABDOMINAL EXPLORATION  2019   adhesion removal> caused bowel blockage    NASAL SEPTUM SURGERY  2004   TONSILLECTOMY  1965   TOTAL ABDOMINAL HYSTERECTOMY W/ BILATERAL SALPINGOOPHORECTOMY  1988   TUBAL LIGATION  1974   UPPER GASTROINTESTINAL ENDOSCOPY  2020   Family History  Problem Relation Age of Onset   Hyperlipidemia Father    Hypertension Father    Arthritis Father    Diabetes Father    Asthma Father    COPD Father    Early death Father     Parkinson's disease Father    Hypertension Mother    Hyperlipidemia Mother    Macular degeneration Mother    Arthritis Mother    Hearing loss Mother    Heart disease Mother    Kidney disease Mother    Throat cancer Brother        lung, and tongue   Arthritis Brother    COPD Brother    Diabetes type II Sister    COPD Sister    Heart disease Sister    Hypertension Sister    Thyroid cancer Sister    Lung cancer Sister    Early death Sister    COPD Sister    Breast cancer Maternal Aunt    Lung cancer Other        uncle   Emphysema Maternal Aunt    Emphysema Maternal Uncle    Clotting disorder Sister    Brain cancer Sister        brain tumor- not malignant   Breast cancer Cousin    Cancer Sister    Alcohol abuse Sister    COPD Sister    Early death Sister    Alcohol abuse Brother    Arthritis Brother    Depression Brother    Diabetes Brother    Hypercholesterolemia Brother    Colon cancer Neg Hx    Esophageal cancer Neg Hx    Rectal cancer Neg Hx    Stomach cancer Neg Hx    Social History   Social History Narrative   Marital status/children/pets: married, 2 children   Education/employment: HS grad. retired   Field seismologist:      -smoke alarm in the home:Yes     - wears seatbelt: Yes     - Feels safe in their relationships: Yes    Allergies as of 04/29/2023       Reactions   Sulfamethoxazole-trimethoprim Hives   Avelox [moxifloxacin Hcl In Nacl]    Racing heart   Cephalexin Hives   Codeine Hives   Heart racing   Cymbalta [duloxetine Hcl]    Erythromycin Hives   Flagyl [metronidazole]    Unknown reaction   Paxlovid [nirmatrelvir-ritonavir]    Propranolol Hives   Sulfa Antibiotics Hives   Tetanus Toxoid Swelling   Reports a fever and headache with swelling.  Tramadol Hives   Statins Other (See Comments)   Myalgia        Medication List        Accurate as of April 29, 2023 10:37 AM. If you have any questions, ask your nurse or doctor.           STOP taking these medications    VITAMIN A PO Stopped by: Felix Pacini       TAKE these medications    albuterol 108 (90 Base) MCG/ACT inhaler Commonly known as: ProAir HFA Inhale 2 puffs into the lungs every 6 (six) hours as needed.   albuterol (2.5 MG/3ML) 0.083% nebulizer solution Commonly known as: PROVENTIL Take 3 mLs (2.5 mg total) by nebulization every 6 (six) hours as needed.   Armour Thyroid 15 MG tablet Generic drug: thyroid Take 1 tablet by mouth in the AM Once a day   Armour Thyroid 30 MG tablet Generic drug: thyroid Take 1 tablet by mouth in the AM once a day   atenolol 25 MG tablet Commonly known as: TENORMIN Take 0.5-1 tablets (12.5-25 mg total) by mouth daily.   benzonatate 200 MG capsule Commonly known as: TESSALON Take 1 capsule (200 mg total) by mouth 3 (three) times daily as needed for cough.   budesonide 0.5 MG/2ML nebulizer solution Commonly known as: PULMICORT TAKE 2 MLS (0.5 MG TOTAL) BY NEBULIZATION IN THE MORNING AND AT BEDTIME.   calcium carbonate 500 MG chewable tablet Commonly known as: TUMS - dosed in mg elemental calcium Chew 3 tablets by mouth daily as needed for indigestion or heartburn.   clobetasol 0.05 % external solution Commonly known as: TEMOVATE Apply 1 Application topically 2 (two) times daily.   clotrimazole 10 MG troche Commonly known as: MYCELEX Take 1 tablet (10 mg total) by mouth 4 (four) times daily as needed.   CoQ-10 100 MG Caps Take 100 mg by mouth daily.   ezetimibe 10 MG tablet Commonly known as: ZETIA Take 1 tablet (10 mg total) by mouth daily.   famotidine 40 MG tablet Commonly known as: PEPCID TAKE 1 TABLET BY MOUTH TWICE A DAY   fexofenadine 180 MG tablet Commonly known as: Allergy Relief Take 1 tablet (180 mg total) by mouth daily.   fluconazole 100 MG tablet Commonly known as: DIFLUCAN Take 100 mg by mouth daily.   fluticasone 50 MCG/ACT nasal spray Commonly known as: FLONASE Place 2  sprays into both nostrils daily.   fluticasone-salmeterol 250-50 MCG/ACT Aepb Commonly known as: Wixela Inhub Inhale 1 puff into the lungs in the morning and at bedtime.   folic acid 1 MG tablet Commonly known as: FOLVITE SMARTSIG:2 Tablet(s) By Mouth   hydrochlorothiazide 12.5 MG capsule Commonly known as: MICROZIDE Take 1 capsule (12.5 mg total) by mouth daily as needed.   hyoscyamine 0.125 MG tablet Commonly known as: LEVSIN Take 1 tablet (0.125 mg total) by mouth every 6 (six) hours as needed.   mometasone 0.1 % lotion Commonly known as: ELOCON Apply 1 application topically daily as needed (ear irritation).   montelukast 10 MG tablet Commonly known as: SINGULAIR TAKE 1 TABLET BY MOUTH EVERYDAY AT BEDTIME   mupirocin ointment 2 % Commonly known as: BACTROBAN Apply 1 Application topically 2 (two) times daily.   ondansetron 8 MG disintegrating tablet Commonly known as: ZOFRAN-ODT TAKE 1 TABLET BY MOUTH EVERY 8 HOURS AS NEEDED FOR NAUSEA OR VOMITING. What changed: Another medication with the same name was removed. Continue taking this medication, and follow the directions  you see here. Changed by: Felix Pacini   pantoprazole 40 MG tablet Commonly known as: PROTONIX Take 1 tablet (40 mg total) by mouth 2 (two) times daily.   Probiotic Caps Take 1 capsule by mouth daily.   rosuvastatin 10 MG tablet Commonly known as: CRESTOR TAKE 1 TABLET BY MOUTH THREE TIMES A WEEK   THERATEARS OP Place 1 drop into both eyes 2 (two) times daily.   tiZANidine 4 MG tablet Commonly known as: ZANAFLEX Take 1 tablet (4 mg total) by mouth every 6 (six) hours as needed for muscle spasms.   valACYclovir 1000 MG tablet Commonly known as: VALTREX 2 tabs at onset, rpt 2 tabs in 12 hours once.   vitamin C 1000 MG tablet Take 3,000 mg by mouth daily.   Vitamin D 125 MCG (5000 UT) Caps Take 5,000 Units by mouth daily.   zinc gluconate 50 MG tablet Take 100 mg by mouth daily.    zolpidem 5 MG tablet Commonly known as: AMBIEN Take 1 tablet (5 mg total) by mouth at bedtime. As needed        All past medical history, surgical history, allergies, family history, immunizations andmedications were updated in the EMR today and reviewed under the history and medication portions of their EMR.       ROS: 14 pt review of systems performed and negative (unless mentioned in an HPI)  Objective: BP 130/73   Pulse 66   Temp 97.9 F (36.6 C)   SpO2 95%  Physical Exam Vitals and nursing note reviewed.  Constitutional:      General: She is not in acute distress.    Appearance: Normal appearance. She is not ill-appearing, toxic-appearing or diaphoretic.  HENT:     Head: Normocephalic and atraumatic.  Eyes:     General: No scleral icterus.       Right eye: No discharge.        Left eye: No discharge.     Extraocular Movements: Extraocular movements intact.     Conjunctiva/sclera: Conjunctivae normal.     Pupils: Pupils are equal, round, and reactive to light.  Cardiovascular:     Rate and Rhythm: Normal rate and regular rhythm.  Pulmonary:     Effort: Pulmonary effort is normal. No respiratory distress.     Breath sounds: Normal breath sounds. No wheezing, rhonchi or rales.  Musculoskeletal:     Cervical back: Neck supple. No tenderness.     Right lower leg: No edema.     Left lower leg: No edema.  Lymphadenopathy:     Cervical: No cervical adenopathy.  Skin:    General: Skin is warm and dry.     Coloration: Skin is not jaundiced or pale.     Findings: No erythema or rash.  Neurological:     Mental Status: She is alert and oriented to person, place, and time. Mental status is at baseline.     Motor: No weakness.     Gait: Gait normal.  Psychiatric:        Mood and Affect: Mood normal.        Behavior: Behavior normal.        Thought Content: Thought content normal.        Judgment: Judgment normal.     No results found.  Assessment/plan: ZONIE CRUTCHER is a 72 y.o. female present for Chronic Conditions/illness Management Gastroesophageal reflux disease, unspecified whether esophagitis present Stable Continue  Protonix. Continue Pepcid.   Essential hypertension/HLD/Statin declined/Palpitations/Obesity (  BMI 30-39.9)-long-term current use of medication Stable Continue Tenormin 12.5 mg qhs Continue Microzide 12.5 mg daily PRN if notable fluid. Only if pressures are elevated > 135 systolic.  Continue low dose statin twice a week supplied by Dr. Isabel Caprice.   Low-sodium diet.  Routine exercise. Cbc, cmp tsh and ldl collected today   Osteoarthritis, unspecified osteoarthritis type, unspecified site/ DDD (degenerative disc disease), lumbar/Spinal stenosis of lumbar region, unspecified whether neurogenic claudication present/Fibromyalgia Stable Continue  Zanaflex   Seasonal allergies/Allergic rhinitis due to pollen, unspecified seasonality/ Multiple food allergies Stable Continue allegra  Vistaril  Tired & not helpful.  Continue  Singulair 10 mg nightly Continue  flonase  Sleep disturbance: Stable Continue Ambien prn Tired vistaril- not helpful. Could consider trazodone in the future if willing.  nccs database reviewed today   irritable bowel syndrome stable Continue probiotic Continue Levsin 0.125 mg every 6 hours as needed  Asthma, intrinsic Stable Continue Symbicort. Continue albuterol as needed Continue antihistamine regimen   Hypothyroidism, unspecified type/Thyroid nodule Stable Continue Armour Thyroid total dose 45 mg daily (30/15).  This is the only medicine called [facility]. Tsh collected and refills will be provided after lab results.   Osteopenia, unspecified location - vit d has been >50 last 2 years -UTD 12/2020-DEXA   Return in about 24 weeks (around 10/14/2023) for cpe (20 min), Routine chronic condition follow-up.    Orders Placed This Encounter  Procedures   TSH   CBC  w/Diff   Comp Met (CMET)   Direct LDL    Meds ordered this encounter  Medications   atenolol (TENORMIN) 25 MG tablet    Sig: Take 0.5-1 tablets (12.5-25 mg total) by mouth daily.    Dispense:  90 tablet    Refill:  1   fexofenadine (ALLERGY RELIEF) 180 MG tablet    Sig: Take 1 tablet (180 mg total) by mouth daily.    Dispense:  90 tablet    Refill:  1   fluticasone (FLONASE) 50 MCG/ACT nasal spray    Sig: Place 2 sprays into both nostrils daily.    Dispense:  16 mL    Refill:  11   hydrochlorothiazide (MICROZIDE) 12.5 MG capsule    Sig: Take 1 capsule (12.5 mg total) by mouth daily as needed.    Dispense:  90 capsule    Refill:  1   montelukast (SINGULAIR) 10 MG tablet    Sig: TAKE 1 TABLET BY MOUTH EVERYDAY AT BEDTIME    Dispense:  90 tablet    Refill:  1   tiZANidine (ZANAFLEX) 4 MG tablet    Sig: Take 1 tablet (4 mg total) by mouth every 6 (six) hours as needed for muscle spasms.    Dispense:  120 tablet    Refill:  5   zolpidem (AMBIEN) 5 MG tablet    Sig: Take 1 tablet (5 mg total) by mouth at bedtime. As needed    Dispense:  90 tablet    Refill:  1   pantoprazole (PROTONIX) 40 MG tablet    Sig: Take 1 tablet (40 mg total) by mouth 2 (two) times daily.    Dispense:  180 tablet    Refill:  3    Referral Orders  No referral(s) requested today      Electronically signed by: Felix Pacini, DO [facility] [place]

## 2023-04-29 NOTE — Patient Instructions (Addendum)
Return in about 24 weeks (around 10/14/2023) for cpe (20 min), Routine chronic condition follow-up.        Great to see you today.  I have refilled the medication(s) we provide.   If labs were collected, we will inform you of lab results once received either by echart message or telephone call.   - echart message- for normal results that have been seen by the patient already.   - telephone call: abnormal results or if patient has not viewed results in their echart.

## 2023-04-30 ENCOUNTER — Other Ambulatory Visit: Payer: Self-pay | Admitting: Family Medicine

## 2023-04-30 DIAGNOSIS — E039 Hypothyroidism, unspecified: Secondary | ICD-10-CM

## 2023-04-30 MED ORDER — ARMOUR THYROID 15 MG PO TABS
ORAL_TABLET | ORAL | 3 refills | Status: DC
Start: 2023-04-30 — End: 2023-10-23

## 2023-04-30 MED ORDER — ARMOUR THYROID 30 MG PO TABS
ORAL_TABLET | ORAL | 3 refills | Status: DC
Start: 2023-04-30 — End: 2023-10-23

## 2023-05-16 ENCOUNTER — Other Ambulatory Visit: Payer: Self-pay | Admitting: Interventional Cardiology

## 2023-05-29 ENCOUNTER — Telehealth (INDEPENDENT_AMBULATORY_CARE_PROVIDER_SITE_OTHER): Payer: Medicare HMO | Admitting: Family Medicine

## 2023-05-29 ENCOUNTER — Encounter (INDEPENDENT_AMBULATORY_CARE_PROVIDER_SITE_OTHER): Payer: Self-pay

## 2023-05-29 ENCOUNTER — Encounter: Payer: Self-pay | Admitting: Family Medicine

## 2023-05-29 VITALS — BP 131/81 | HR 99 | Temp 101.3°F | Wt 135.0 lb

## 2023-05-29 DIAGNOSIS — J4541 Moderate persistent asthma with (acute) exacerbation: Secondary | ICD-10-CM | POA: Diagnosis not present

## 2023-05-29 DIAGNOSIS — U071 COVID-19: Secondary | ICD-10-CM | POA: Diagnosis not present

## 2023-05-29 MED ORDER — PREDNISONE 20 MG PO TABS: ORAL_TABLET | ORAL | 0 refills | Status: DC

## 2023-05-29 MED ORDER — DOXYCYCLINE HYCLATE 100 MG PO TABS: 100.0000 mg | ORAL_TABLET | Freq: Two times a day (BID) | ORAL | 0 refills | Status: DC

## 2023-05-29 MED ORDER — BENZONATATE 200 MG PO CAPS
200.0000 mg | ORAL_CAPSULE | Freq: Three times a day (TID) | ORAL | 1 refills | Status: DC | PRN
Start: 2023-05-29 — End: 2023-06-23

## 2023-05-29 NOTE — Progress Notes (Addendum)
VIRTUAL VISIT VIA VIDEO  I connected with Rita Levine on 05/29/23 at  9:20 AM EDT by a video enabled telemedicine application and verified that I am speaking with the correct person using two identifiers. Location patient: Home Location provider: [facility], Office Persons participating in the virtual visit: Patient, Dr. Claiborne Billings and Ivonne Andrew, CMA  I discussed the limitations of evaluation and management by telemedicine and the availability of in person appointments. The patient expressed understanding and agreed to proceed.     Rita Levine , January 22, 1951, 72 y.o., female MRN: [id number] Patient Care Team    Relationship Specialty Notifications Start End  Natalia Leatherwood, DO PCP - General Family Medicine  12/01/19   Corky Crafts, MD PCP - Cardiology Cardiology  01/26/20   Rachael Fee, MD Attending Physician Gastroenterology  12/01/19   Glyn Ade, PA-C Physician Assistant Dermatology  04/18/22   Daneen Schick, OD  Optometry  07/26/22     Chief Complaint  Patient presents with   Covid Positive    Tested positive Monday; congestion, cough, HA     Subjective: Rita Levine is a 72 y.o. Pt presents for an OV with complaints of covid 19 illness of 4 days duration.  She tested positive for covid on 05/26/2023. Associated symptoms include congestion, cough productive, headache, fatigue and nausea. Her husband tested positive the day prior.  Pt had a reaction to paxlovid in the past and would like to avoid antivirals altogether for covid.   Pt has tried mucinex dm, tylenol to ease their symptoms.      04/29/2023   10:07 AM 10/02/2022    8:21 AM 06/27/2022    1:17 PM 04/24/2022    8:50 AM 11/06/2021   11:04 AM  Depression screen PHQ 2/9  Decreased Interest 0 0 0 0 0  Down, Depressed, Hopeless 0 0 0 0 0  PHQ - 2 Score 0 0 0 0 0  Altered sleeping    0 0  Tired, decreased energy    3 1  Change in appetite    1 0  Feeling bad or failure about yourself      0 0  Trouble concentrating    1 0  Moving slowly or fidgety/restless    0 0  Suicidal thoughts    0 0  PHQ-9 Score    5 1    Allergies  Allergen Reactions   Sulfamethoxazole-Trimethoprim Hives   Avelox [Moxifloxacin Hcl In Nacl]     Racing heart   Cephalexin Hives   Codeine Hives    Heart racing   Cymbalta [Duloxetine Hcl]    Erythromycin Hives   Flagyl [Metronidazole]     Unknown reaction   Paxlovid [Nirmatrelvir-Ritonavir]    Propranolol Hives   Sulfa Antibiotics Hives   Tetanus Toxoid Swelling    Reports a fever and headache with swelling.    Tramadol Hives   Statins Other (See Comments)    Myalgia    Social History   Social History Narrative   Marital status/children/pets: married, 2 children   Education/employment: HS grad. retired   Field seismologist:      -smoke alarm in the home:Yes     - wears seatbelt: Yes     - Feels safe in their relationships: Yes   Past Medical History:  Diagnosis Date   Allergic rhinitis    Allergic rhinitis due to pollen 12/03/2019   Allergy    Anemia  Asthma    Cataract    Chest pain, unspecified 04/07/2014   Chicken pox    Colon polyp    Diverticulosis    Dysphagia    Dysrhythmia    Palpitations   Fibromyalgia    Food allergy    Gallstone    GERD (gastroesophageal reflux disease)    Hiatal hernia    Hypercholesteremia    Hypertension    Hypothyroidism    IBS (irritable bowel syndrome)    Ingrown toenail 08/23/2020   Migraine headache    occasionally   Morton neuroma, 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   Osteoarthritis    Osteopenia    Pneumonia    PONV (postoperative nausea and vomiting)    Pulmonary hypertension (HCC)    pt denies, pt states she was tested for it but was not diagnosed with it   Rectal bleeding    SBO (small bowel obstruction) (HCC) 10/20/2017   Seasonal allergies    Subconjunctival hemorrhage of left eye 08/01/2022   Thyroid nodule    Trigger finger of left and right ring fingers 01/11/2021   Past Surgical  History:  Procedure Laterality Date   APPENDECTOMY  1982   BREAST EXCISIONAL BIOPSY Right 1995   benign   BREAST LUMPECTOMY  1991   CARPAL TUNNEL RELEASE Right 2006   Right wrist   CESAREAN SECTION     x 2   CHOLECYSTECTOMY N/A 07/25/2020   Procedure: LAPAROSCOPIC CHOLECYSTECTOMY;  Surgeon: Almond Lint, MD;  Location: [facility] OR;  Service: General;  Laterality: N/A;   COLON RESECTION  1990   COLONOSCOPY     CT ABDOMEN PELVIS W ([facility] HX)  01/03/2020   IMAGE MRI brain:  04/2012   No focal IAC or inner ear lesion to explain hearing loss. Slightly greater than expected number of subcortical T2- hyperintensities bilaterally.  These are nonspecific.  They can be seen in the setting of chronic migraine headaches, demyelinating process, chronic microvascular ischemic, prior infection or inflammation, or vasculitis   IMAGE MRI lumbar:  05/01/2006   Mild central canal stenosis with facet arthropathy and mildly bulging disc at L4-5.  There is mild narrowing in the left lateral recess at this level.  No definite neural impingement.  Appearance at this level has not markedly Moderately severe facet arthropathy at L5-S1 with mild interval progression of a diffuse broad based disc bulge.  There is mild biforaminal narrowing.  Central canal is open   LAPAROSCOPIC ABDOMINAL EXPLORATION  2019   adhesion removal> caused bowel blockage    NASAL SEPTUM SURGERY  2004   TONSILLECTOMY  1965   TOTAL ABDOMINAL HYSTERECTOMY W/ BILATERAL SALPINGOOPHORECTOMY  1988   TUBAL LIGATION  1974   UPPER GASTROINTESTINAL ENDOSCOPY  2020   Family History  Problem Relation Age of Onset   Hyperlipidemia Father    Hypertension Father    Arthritis Father    Diabetes Father    Asthma Father    COPD Father    Early death Father    Parkinson's disease Father    Hypertension Mother    Hyperlipidemia Mother    Macular degeneration Mother    Arthritis Mother    Hearing loss Mother    Heart disease Mother    Kidney disease  Mother    Throat cancer Brother        lung, and tongue   Arthritis Brother    COPD Brother    Diabetes type II Sister    COPD Sister    Heart disease Sister  Hypertension Sister    Thyroid cancer Sister    Lung cancer Sister    Early death Sister    COPD Sister    Breast cancer Maternal Aunt    Lung cancer Other        uncle   Emphysema Maternal Aunt    Emphysema Maternal Uncle    Clotting disorder Sister    Brain cancer Sister        brain tumor- not malignant   Breast cancer Cousin    Cancer Sister    Alcohol abuse Sister    COPD Sister    Early death Sister    Alcohol abuse Brother    Arthritis Brother    Depression Brother    Diabetes Brother    Hypercholesterolemia Brother    Colon cancer Neg Hx    Esophageal cancer Neg Hx    Rectal cancer Neg Hx    Stomach cancer Neg Hx    Allergies as of 05/29/2023       Reactions   Sulfamethoxazole-trimethoprim Hives   Avelox [moxifloxacin Hcl In Nacl]    Racing heart   Cephalexin Hives   Codeine Hives   Heart racing   Cymbalta [duloxetine Hcl]    Erythromycin Hives   Flagyl [metronidazole]    Unknown reaction   Paxlovid [nirmatrelvir-ritonavir]    Propranolol Hives   Sulfa Antibiotics Hives   Tetanus Toxoid Swelling   Reports a fever and headache with swelling.    Tramadol Hives   Statins Other (See Comments)   Myalgia        Medication List        Accurate as of May 29, 2023  9:26 AM. If you have any questions, ask your nurse or doctor.          STOP taking these medications    fluconazole 100 MG tablet Commonly known as: DIFLUCAN Stopped by: Felix Pacini       TAKE these medications    albuterol 108 (90 Base) MCG/ACT inhaler Commonly known as: ProAir HFA Inhale 2 puffs into the lungs every 6 (six) hours as needed.   albuterol (2.5 MG/3ML) 0.083% nebulizer solution Commonly known as: PROVENTIL Take 3 mLs (2.5 mg total) by nebulization every 6 (six) hours as needed.   Armour  Thyroid 30 MG tablet Generic drug: thyroid Take 1 tablet by mouth in the AM once a day   Armour Thyroid 15 MG tablet Generic drug: thyroid Take 1 tablet by mouth in the AM Once a day   atenolol 25 MG tablet Commonly known as: TENORMIN Take 0.5-1 tablets (12.5-25 mg total) by mouth daily.   benzonatate 200 MG capsule Commonly known as: TESSALON Take 1 capsule (200 mg total) by mouth 3 (three) times daily as needed for cough.   budesonide 0.5 MG/2ML nebulizer solution Commonly known as: PULMICORT TAKE 2 MLS (0.5 MG TOTAL) BY NEBULIZATION IN THE MORNING AND AT BEDTIME.   calcium carbonate 500 MG chewable tablet Commonly known as: TUMS - dosed in mg elemental calcium Chew 3 tablets by mouth daily as needed for indigestion or heartburn.   clobetasol 0.05 % external solution Commonly known as: TEMOVATE Apply 1 Application topically 2 (two) times daily.   clotrimazole 10 MG troche Commonly known as: MYCELEX Take 1 tablet (10 mg total) by mouth 4 (four) times daily as needed.   CoQ-10 100 MG Caps Take 100 mg by mouth daily.   doxycycline 100 MG tablet Commonly known as: VIBRA-TABS Take 1 tablet (100  mg total) by mouth 2 (two) times daily. Started by: Felix Pacini   ezetimibe 10 MG tablet Commonly known as: ZETIA Take 1 tablet (10 mg total) by mouth daily.   famotidine 40 MG tablet Commonly known as: PEPCID TAKE 1 TABLET BY MOUTH TWICE A DAY   fexofenadine 180 MG tablet Commonly known as: Allergy Relief Take 1 tablet (180 mg total) by mouth daily.   fluticasone 50 MCG/ACT nasal spray Commonly known as: FLONASE Place 2 sprays into both nostrils daily.   fluticasone-salmeterol 250-50 MCG/ACT Aepb Commonly known as: Wixela Inhub Inhale 1 puff into the lungs in the morning and at bedtime.   folic acid 1 MG tablet Commonly known as: FOLVITE SMARTSIG:2 Tablet(s) By Mouth   hydrochlorothiazide 12.5 MG capsule Commonly known as: MICROZIDE Take 1 capsule (12.5 mg  total) by mouth daily as needed.   hyoscyamine 0.125 MG tablet Commonly known as: LEVSIN Take 1 tablet (0.125 mg total) by mouth every 6 (six) hours as needed.   mometasone 0.1 % lotion Commonly known as: ELOCON Apply 1 application topically daily as needed (ear irritation).   montelukast 10 MG tablet Commonly known as: SINGULAIR TAKE 1 TABLET BY MOUTH EVERYDAY AT BEDTIME   mupirocin ointment 2 % Commonly known as: BACTROBAN Apply 1 Application topically 2 (two) times daily.   ondansetron 8 MG disintegrating tablet Commonly known as: ZOFRAN-ODT TAKE 1 TABLET BY MOUTH EVERY 8 HOURS AS NEEDED FOR NAUSEA OR VOMITING.   pantoprazole 40 MG tablet Commonly known as: PROTONIX Take 1 tablet (40 mg total) by mouth 2 (two) times daily.   predniSONE 20 MG tablet Commonly known as: DELTASONE 60 mg x3d, 40 mg x3d, 20 mg x2d, 10 mg x2d Started by: Felix Pacini   Probiotic Caps Take 1 capsule by mouth daily.   rosuvastatin 10 MG tablet Commonly known as: CRESTOR TAKE 1 TABLET BY MOUTH THREE TIMES A WEEK.   THERATEARS OP Place 1 drop into both eyes 2 (two) times daily.   tiZANidine 4 MG tablet Commonly known as: ZANAFLEX Take 1 tablet (4 mg total) by mouth every 6 (six) hours as needed for muscle spasms.   valACYclovir 1000 MG tablet Commonly known as: VALTREX 2 tabs at onset, rpt 2 tabs in 12 hours once.   vitamin C 1000 MG tablet Take 3,000 mg by mouth daily.   Vitamin D 125 MCG (5000 UT) Caps Take 5,000 Units by mouth daily.   zinc gluconate 50 MG tablet Take 100 mg by mouth daily.   zolpidem 5 MG tablet Commonly known as: AMBIEN Take 1 tablet (5 mg total) by mouth at bedtime. As needed        All past medical history, surgical history, allergies, family history, immunizations andmedications were updated in the EMR today and reviewed under the history and medication portions of their EMR.     ROS Negative, with the exception of above mentioned in  HPI   Objective:  BP 131/81   Pulse 99   Temp (!) 101.3 F (38.5 C)   Wt 135 lb (61.2 kg)   SpO2 95%   BMI 27.27 kg/m  Body mass index is 27.27 kg/m.  Physical Exam Vitals and nursing note reviewed.  Constitutional:      General: She is not in acute distress.    Appearance: Normal appearance. She is ill-appearing. She is not toxic-appearing.  HENT:     Head: Normocephalic and atraumatic.  Eyes:     General: No scleral icterus.  Right eye: No discharge.        Left eye: No discharge.     Conjunctiva/sclera: Conjunctivae normal.  Pulmonary:     Effort: Pulmonary effort is normal.     Comments: Cough present Musculoskeletal:     Cervical back: Normal range of motion.  Skin:    Findings: No rash.  Neurological:     Mental Status: She is alert and oriented to person, place, and time. Mental status is at baseline.  Psychiatric:        Mood and Affect: Mood normal.        Behavior: Behavior normal.        Thought Content: Thought content normal.        Judgment: Judgment normal.      No results found. No results found. No results found for this or any previous visit (from the past 24 hour(s)).  Assessment/Plan: NEKOLE KAMINSKY is a 72 y.o. female present for OV for  COVID-19 Rest, hydrate.  Continue flonase, mucinex (DM if cough), nettie pot or nasal saline.  Doxy bid and prednisone taper prescribed, take until completed. Pt understands covid is a virus and these medications do not cure current infection. They can help in patients with lung disease, such as asthma prevent and help with exacerbation of condition and further complications . Refilled tessalon perles Reviewed home care instructions for COVID. Advised self-isolation at home for at least 5 days. After 5 days, if improved and fever resolved, can be in public, but should wear a mask around others for an additional 5 days. If symptoms, esp, dyspnea develops/worsens, recommend in-person evaluation at either  an urgent care or the emergency room.  Reviewed expectations re: course of current medical issues. Discussed self-management of symptoms. Outlined signs and symptoms indicating need for more acute intervention. Patient verbalized understanding and all questions were answered. Patient received an After-Visit Summary.    No orders of the defined types were placed in this encounter.  Meds ordered this encounter  Medications   benzonatate (TESSALON) 200 MG capsule    Sig: Take 1 capsule (200 mg total) by mouth 3 (three) times daily as needed for cough.    Dispense:  30 capsule    Refill:  1   doxycycline (VIBRA-TABS) 100 MG tablet    Sig: Take 1 tablet (100 mg total) by mouth 2 (two) times daily.    Dispense:  20 tablet    Refill:  0   predniSONE (DELTASONE) 20 MG tablet    Sig: 60 mg x3d, 40 mg x3d, 20 mg x2d, 10 mg x2d    Dispense:  18 tablet    Refill:  0   Referral Orders  No referral(s) requested today     Note is dictated utilizing voice recognition software. Although note has been proof read prior to signing, occasional typographical errors still can be missed. If any questions arise, please do not hesitate to call for verification.   electronically signed by:  Felix Pacini, DO  [facility] - OR

## 2023-06-03 ENCOUNTER — Ambulatory Visit: Payer: Medicare HMO | Admitting: Gastroenterology

## 2023-06-03 DIAGNOSIS — Z887 Allergy status to serum and vaccine status: Secondary | ICD-10-CM | POA: Diagnosis not present

## 2023-06-03 DIAGNOSIS — R059 Cough, unspecified: Secondary | ICD-10-CM | POA: Diagnosis not present

## 2023-06-03 DIAGNOSIS — R0602 Shortness of breath: Secondary | ICD-10-CM | POA: Diagnosis not present

## 2023-06-03 DIAGNOSIS — R0789 Other chest pain: Secondary | ICD-10-CM | POA: Diagnosis not present

## 2023-06-03 DIAGNOSIS — Z79899 Other long term (current) drug therapy: Secondary | ICD-10-CM | POA: Diagnosis not present

## 2023-06-03 DIAGNOSIS — U071 COVID-19: Secondary | ICD-10-CM | POA: Diagnosis not present

## 2023-06-03 DIAGNOSIS — Z882 Allergy status to sulfonamides status: Secondary | ICD-10-CM | POA: Diagnosis not present

## 2023-06-03 DIAGNOSIS — J984 Other disorders of lung: Secondary | ICD-10-CM | POA: Diagnosis not present

## 2023-06-03 DIAGNOSIS — E876 Hypokalemia: Secondary | ICD-10-CM | POA: Diagnosis not present

## 2023-06-03 DIAGNOSIS — R509 Fever, unspecified: Secondary | ICD-10-CM | POA: Diagnosis not present

## 2023-06-03 DIAGNOSIS — R918 Other nonspecific abnormal finding of lung field: Secondary | ICD-10-CM | POA: Diagnosis not present

## 2023-06-03 DIAGNOSIS — J1282 Pneumonia due to coronavirus disease 2019: Secondary | ICD-10-CM | POA: Diagnosis not present

## 2023-06-03 DIAGNOSIS — Z888 Allergy status to other drugs, medicaments and biological substances status: Secondary | ICD-10-CM | POA: Diagnosis not present

## 2023-06-04 ENCOUNTER — Encounter: Payer: Self-pay | Admitting: Family Medicine

## 2023-06-04 NOTE — Telephone Encounter (Signed)
Patient would need to be seen in person to evaluate the condition appropriately and provide her with recommendations.  I do strongly encourage she follow-up with in the next 7 to 10 days as recommended.  As far as the coughing up a small amount of blood, bright red blood and is not typically normal, but blood-tinged sputum can be normal with a cough

## 2023-06-04 NOTE — Telephone Encounter (Signed)
Pt scheduled for 8/30

## 2023-06-11 ENCOUNTER — Telehealth: Payer: Self-pay | Admitting: Family Medicine

## 2023-06-11 NOTE — Telephone Encounter (Signed)
Patient called to request she have a x-ray recheck done on her chest. She wants to confirm that she is on the mend and that she is healing from having Covid/pneumonia. She continues to have a burning sensation in her chest mostly at night. I informed her that Dr. Claiborne Billings was not in office and unsure if this can be ordered by another physician, but this is something she is requesting because she is concerned she may not be improving. Please give the patient a call to discuss options for an x-ray.

## 2023-06-12 ENCOUNTER — Other Ambulatory Visit: Payer: Self-pay | Admitting: Physician Assistant

## 2023-06-12 NOTE — Telephone Encounter (Signed)
Please advise in PCP absence.  

## 2023-06-12 NOTE — Telephone Encounter (Signed)
Please assist patient with scheduling, thanks. 

## 2023-06-12 NOTE — Telephone Encounter (Signed)
Office visit needed-> in person

## 2023-06-13 ENCOUNTER — Inpatient Hospital Stay: Payer: Medicare HMO | Admitting: Family Medicine

## 2023-06-13 DIAGNOSIS — F439 Reaction to severe stress, unspecified: Secondary | ICD-10-CM | POA: Diagnosis not present

## 2023-06-13 DIAGNOSIS — B370 Candidal stomatitis: Secondary | ICD-10-CM | POA: Diagnosis not present

## 2023-06-13 DIAGNOSIS — F419 Anxiety disorder, unspecified: Secondary | ICD-10-CM | POA: Diagnosis not present

## 2023-06-13 DIAGNOSIS — R238 Other skin changes: Secondary | ICD-10-CM | POA: Diagnosis not present

## 2023-06-13 DIAGNOSIS — R030 Elevated blood-pressure reading, without diagnosis of hypertension: Secondary | ICD-10-CM | POA: Diagnosis not present

## 2023-06-13 DIAGNOSIS — I10 Essential (primary) hypertension: Secondary | ICD-10-CM | POA: Diagnosis not present

## 2023-06-13 DIAGNOSIS — G479 Sleep disorder, unspecified: Secondary | ICD-10-CM | POA: Diagnosis not present

## 2023-06-17 MED ORDER — LORAZEPAM 0.5 MG PO TABS: ORAL_TABLET | ORAL | 0 refills | Status: DC

## 2023-06-17 NOTE — Telephone Encounter (Signed)
I understand her predicament and am sympathetic. However, for her safety I have to be careful with this request. Her med list has Zanaflex and Ambien, both of which could interact with any medication to treat anxiety. I am willing to prescribe a small amount of anxiety medication to use once a day and I want her to make sure NOT to use it within 6 hours of taking any Zanaflex or Ambien. Also, her blood pressure being so high is worrisome. I understand she is busy with her husband in the hospital but she needs to arrange an appointment here for follow-up sometime later this week. --PM

## 2023-06-18 MED ORDER — LORAZEPAM 0.5 MG PO TABS: ORAL_TABLET | ORAL | 0 refills | Status: DC

## 2023-06-18 NOTE — Telephone Encounter (Signed)
Ok, #10 lorazepam sent in

## 2023-06-18 NOTE — Telephone Encounter (Signed)
4:00 on 9/6 is fine

## 2023-06-19 ENCOUNTER — Other Ambulatory Visit: Payer: Self-pay | Admitting: Nurse Practitioner

## 2023-06-19 DIAGNOSIS — J45909 Unspecified asthma, uncomplicated: Secondary | ICD-10-CM

## 2023-06-22 ENCOUNTER — Other Ambulatory Visit: Payer: Self-pay | Admitting: Interventional Cardiology

## 2023-06-23 ENCOUNTER — Ambulatory Visit (INDEPENDENT_AMBULATORY_CARE_PROVIDER_SITE_OTHER): Payer: Medicare HMO | Admitting: Family Medicine

## 2023-06-23 VITALS — BP 129/78 | HR 83 | Temp 97.7°F | Wt 125.0 lb

## 2023-06-23 DIAGNOSIS — E876 Hypokalemia: Secondary | ICD-10-CM | POA: Diagnosis not present

## 2023-06-23 DIAGNOSIS — U071 COVID-19: Secondary | ICD-10-CM | POA: Diagnosis not present

## 2023-06-23 DIAGNOSIS — F418 Other specified anxiety disorders: Secondary | ICD-10-CM | POA: Diagnosis not present

## 2023-06-23 DIAGNOSIS — J1282 Pneumonia due to coronavirus disease 2019: Secondary | ICD-10-CM | POA: Diagnosis not present

## 2023-06-23 DIAGNOSIS — F4321 Adjustment disorder with depressed mood: Secondary | ICD-10-CM | POA: Diagnosis not present

## 2023-06-23 HISTORY — DX: Hypokalemia: E87.6

## 2023-06-23 HISTORY — DX: COVID-19: U07.1

## 2023-06-23 LAB — COMPREHENSIVE METABOLIC PANEL
ALT: 22 U/L (ref 0–35)
AST: 27 U/L (ref 0–37)
Albumin: 4.1 g/dL (ref 3.5–5.2)
Alkaline Phosphatase: 84 U/L (ref 39–117)
BUN: 7 mg/dL (ref 6–23)
CO2: 31 meq/L (ref 19–32)
Calcium: 9.8 mg/dL (ref 8.4–10.5)
Chloride: 95 meq/L — ABNORMAL LOW (ref 96–112)
Creatinine, Ser: 0.66 mg/dL (ref 0.40–1.20)
GFR: 87.84 mL/min (ref >60.00)
Glucose, Bld: 100 mg/dL — ABNORMAL HIGH (ref 70–99)
Potassium: 3.8 meq/L (ref 3.5–5.1)
Sodium: 135 meq/L (ref 135–145)
Total Bilirubin: 0.6 mg/dL (ref 0.2–1.2)
Total Protein: 6.8 g/dL (ref 6.0–8.3)

## 2023-06-23 MED ORDER — LORAZEPAM 0.5 MG PO TABS: 0.5000 mg | ORAL_TABLET | Freq: Two times a day (BID) | ORAL | 5 refills | Status: DC | PRN

## 2023-06-23 NOTE — Patient Instructions (Addendum)
Return for See you in [place] for combined physical and chronic medical conditions appt. Randie Heinz to see you today.  I have refilled the medication(s) we provide.   If labs were collected or images ordered, we will inform you of  results once we have received them and reviewed. We will contact you either by echart message, or telephone call.  Please give ample time to the testing facility, and our office to run,  receive and review results. Please do not call inquiring of results, even if you can see them in your chart. We will contact you as soon as we are able. If it has been over 1 week since the test was completed, and you have not yet heard from [place], then please call us.    - echart message- for normal results that have been seen by the patient already.   - telephone call: abnormal results or if patient has not viewed results in their echart.  If a referral to a specialist was entered for you, please call us in 2 weeks if you have not heard from the specialist office to schedule.

## 2023-06-23 NOTE — Progress Notes (Unsigned)
Rita Levine , December 24, 1950, 72 y.o., female MRN: [id number] Patient Care Team    Relationship Specialty Notifications Start End  Natalia Leatherwood, DO PCP - General Family Medicine  12/01/19   Corky Crafts, MD PCP - Cardiology Cardiology  01/26/20   Rachael Fee, MD Attending Physician Gastroenterology  12/01/19   Suzi Roots Physician Assistant Dermatology  04/18/22   Daneen Schick, OD  Optometry  07/26/22     Chief Complaint  Patient presents with   Follow-up    ED follow up on multiple issues. She states her husband passed away on 07-05-23 and she has been having anxiety since.     Subjective: Rita Levine is a 72 y.o. Pt presents for ED visit follow up  Mouth ulcers/thrush: Treated with fluconazole 200 mg x7d and Valtrex, swabbed for HSV 1_ negative for HSV 1&2. Anxiety: Ativan had been prescribed recently by another provider. She reports today it worked well for her .  Her husband passed away 3 days ago after being in the ICU for 3 weeks status post complications with COVID and chronic conditions.  She reports overall she is coping his services are at the end of this week. She reports she is feeling much improved since her COVID diagnosis and she was completed with antibiotics.  06/03/2023: CMP: NA 131, potassium 2.9 Cl 89, otherwise WNL Trop negativex2 D-dimer WNL for age.  Pro-bnp WNL CBC- WNL CT angio:Bilateral lung infiltrate that could be consistent with covid. No pulmonary embolus  EKG  Diagnosis Class Abnormal  Acquisition Device D3K  Ventricular Rate 93  Atrial Rate 93  P-R Interval 176  QRS Duration 94  Q-T Interval 360  QTC Calculation(Bazett) 447  Calculated P Axis 39  Calculated R Axis -4  Calculated T Axis 28  Diagnosis Normal sinus rhythm  Incomplete right bundle branch block  Anterior infarct (cited on or before 03-Jun-2023)  Abnormal ECG   NWG:NFAOZHYQMV:  XR CHEST  Patchy bilateral basilar densities are present, concerning  for developing infiltrates.      06/23/2023   11:54 AM 04/29/2023   10:07 AM 10/02/2022    8:21 AM 06/27/2022    1:17 PM 04/24/2022    8:50 AM  Depression screen PHQ 2/9  Decreased Interest 1 0 0 0 0  Down, Depressed, Hopeless 1 0 0 0 0  PHQ - 2 Score 2 0 0 0 0  Altered sleeping 2    0  Tired, decreased energy 3    3  Change in appetite 0    1  Feeling bad or failure about yourself  2    0  Trouble concentrating 2    1  Moving slowly or fidgety/restless 0    0  Suicidal thoughts 0    0  PHQ-9 Score 11    5  Difficult doing work/chores Very difficult        Allergies  Allergen Reactions   Sulfamethoxazole-Trimethoprim Hives   Avelox [Moxifloxacin Hcl In Nacl]     Racing heart   Cephalexin Hives   Codeine Hives    Heart racing   Cymbalta [Duloxetine Hcl]    Erythromycin Hives   Flagyl [Metronidazole]     Unknown reaction   Paxlovid [Nirmatrelvir-Ritonavir]    Propranolol Hives   Sulfa Antibiotics Hives   Tetanus Toxoid Swelling    Reports a fever and headache with swelling.    Tramadol Hives   Statins Other (See Comments)  Myalgia    Social History   Social History Narrative   Marital status/children/pets: married, 2 children   Education/employment: HS grad. retired   Field seismologist:      -smoke alarm in the home:Yes     - wears seatbelt: Yes     - Feels safe in their relationships: Yes   Past Medical History:  Diagnosis Date   Allergic rhinitis    Allergic rhinitis due to pollen 12/03/2019   Allergy    Anemia    Asthma    Cataract    Chest pain, unspecified 04/07/2014   Chicken pox    Colon polyp    Diverticulosis    Dysphagia    Dysrhythmia    Palpitations   Fibromyalgia    Food allergy    Gallstone    GERD (gastroesophageal reflux disease)    Hiatal hernia    Hypercholesteremia    Hypertension    Hypothyroidism    IBS (irritable bowel syndrome)    Ingrown toenail 08/23/2020   Migraine headache    occasionally   Morton neuroma, 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    Osteoarthritis    Osteopenia    Pneumonia    PONV (postoperative nausea and vomiting)    Pulmonary hypertension (HCC)    pt denies, pt states she was tested for it but was not diagnosed with it   Rectal bleeding    SBO (small bowel obstruction) (HCC) 10/20/2017   Seasonal allergies    Subconjunctival hemorrhage of left eye 08/01/2022   Thyroid nodule    Trigger finger of left and right ring fingers 01/11/2021   Past Surgical History:  Procedure Laterality Date   APPENDECTOMY  1982   BREAST EXCISIONAL BIOPSY Right 1995   benign   BREAST LUMPECTOMY  1991   CARPAL TUNNEL RELEASE Right 2006   Right wrist   CESAREAN SECTION     x 2   CHOLECYSTECTOMY N/A 07/25/2020   Procedure: LAPAROSCOPIC CHOLECYSTECTOMY;  Surgeon: Almond Lint, MD;  Location: [facility] OR;  Service: General;  Laterality: N/A;   COLON RESECTION  1990   COLONOSCOPY     CT ABDOMEN PELVIS W ([facility] HX)  01/03/2020   IMAGE MRI brain:  04/2012   No focal IAC or inner ear lesion to explain hearing loss. Slightly greater than expected number of subcortical T2- hyperintensities bilaterally.  These are nonspecific.  They can be seen in the setting of chronic migraine headaches, demyelinating process, chronic microvascular ischemic, prior infection or inflammation, or vasculitis   IMAGE MRI lumbar:  05/01/2006   Mild central canal stenosis with facet arthropathy and mildly bulging disc at L4-5.  There is mild narrowing in the left lateral recess at this level.  No definite neural impingement.  Appearance at this level has not markedly Moderately severe facet arthropathy at L5-S1 with mild interval progression of a diffuse broad based disc bulge.  There is mild biforaminal narrowing.  Central canal is open   LAPAROSCOPIC ABDOMINAL EXPLORATION  2019   adhesion removal> caused bowel blockage    NASAL SEPTUM SURGERY  2004   TONSILLECTOMY  1965   TOTAL ABDOMINAL HYSTERECTOMY W/ BILATERAL SALPINGOOPHORECTOMY  1988   TUBAL LIGATION  1974    UPPER GASTROINTESTINAL ENDOSCOPY  2020   Family History  Problem Relation Age of Onset   Hyperlipidemia Father    Hypertension Father    Arthritis Father    Diabetes Father    Asthma Father    COPD Father    Early death Father  Parkinson's disease Father    Hypertension Mother    Hyperlipidemia Mother    Macular degeneration Mother    Arthritis Mother    Hearing loss Mother    Heart disease Mother    Kidney disease Mother    Throat cancer Brother        lung, and tongue   Arthritis Brother    COPD Brother    Diabetes type II Sister    COPD Sister    Heart disease Sister    Hypertension Sister    Thyroid cancer Sister    Lung cancer Sister    Early death Sister    COPD Sister    Breast cancer Maternal Aunt    Lung cancer Other        uncle   Emphysema Maternal Aunt    Emphysema Maternal Uncle    Clotting disorder Sister    Brain cancer Sister        brain tumor- not malignant   Breast cancer Cousin    Cancer Sister    Alcohol abuse Sister    COPD Sister    Early death Sister    Alcohol abuse Brother    Arthritis Brother    Depression Brother    Diabetes Brother    Hypercholesterolemia Brother    Colon cancer Neg Hx    Esophageal cancer Neg Hx    Rectal cancer Neg Hx    Stomach cancer Neg Hx    Allergies as of 06/23/2023       Reactions   Sulfamethoxazole-trimethoprim Hives   Avelox [moxifloxacin Hcl In Nacl]    Racing heart   Cephalexin Hives   Codeine Hives   Heart racing   Cymbalta [duloxetine Hcl]    Erythromycin Hives   Flagyl [metronidazole]    Unknown reaction   Paxlovid [nirmatrelvir-ritonavir]    Propranolol Hives   Sulfa Antibiotics Hives   Tetanus Toxoid Swelling   Reports a fever and headache with swelling.    Tramadol Hives   Statins Other (See Comments)   Myalgia        Medication List        Accurate as of June 23, 2023 11:59 PM. If you have any questions, ask your nurse or doctor.          STOP taking these  medications    benzonatate 200 MG capsule Commonly known as: TESSALON   doxycycline 100 MG tablet Commonly known as: VIBRA-TABS   mupirocin ointment 2 % Commonly known as: BACTROBAN   predniSONE 20 MG tablet Commonly known as: DELTASONE   valACYclovir 1000 MG tablet Commonly known as: VALTREX       TAKE these medications    albuterol 108 (90 Base) MCG/ACT inhaler Commonly known as: ProAir HFA Inhale 2 puffs into the lungs every 6 (six) hours as needed.   albuterol (2.5 MG/3ML) 0.083% nebulizer solution Commonly known as: PROVENTIL Take 3 mLs (2.5 mg total) by nebulization every 6 (six) hours as needed.   Armour Thyroid 30 MG tablet Generic drug: thyroid Take 1 tablet by mouth in the AM once a day   Armour Thyroid 15 MG tablet Generic drug: thyroid Take 1 tablet by mouth in the AM Once a day   atenolol 25 MG tablet Commonly known as: TENORMIN Take 0.5-1 tablets (12.5-25 mg total) by mouth daily.   budesonide 0.5 MG/2ML nebulizer solution Commonly known as: PULMICORT TAKE 2 MLS (0.5 MG TOTAL) BY NEBULIZATION IN THE MORNING AND AT BEDTIME.  calcium carbonate 500 MG chewable tablet Commonly known as: TUMS - dosed in mg elemental calcium Chew 3 tablets by mouth daily as needed for indigestion or heartburn.   clobetasol 0.05 % external solution Commonly known as: TEMOVATE Apply 1 Application topically 2 (two) times daily.   clotrimazole 10 MG troche Commonly known as: MYCELEX Take 1 tablet (10 mg total) by mouth 4 (four) times daily as needed.   CoQ-10 100 MG Caps Take 100 mg by mouth daily.   ezetimibe 10 MG tablet Commonly known as: ZETIA TAKE 1 TABLET BY MOUTH EVERY DAY   famotidine 40 MG tablet Commonly known as: PEPCID TAKE 1 TABLET BY MOUTH TWICE A DAY   fexofenadine 180 MG tablet Commonly known as: Allergy Relief Take 1 tablet (180 mg total) by mouth daily.   fluconazole 200 MG tablet Commonly known as: DIFLUCAN Take 200 mg by mouth  daily.   fluticasone 50 MCG/ACT nasal spray Commonly known as: FLONASE Place 2 sprays into both nostrils daily.   folic acid 1 MG tablet Commonly known as: FOLVITE SMARTSIG:2 Tablet(s) By Mouth   hydrochlorothiazide 12.5 MG capsule Commonly known as: MICROZIDE Take 1 capsule (12.5 mg total) by mouth daily as needed.   hyoscyamine 0.125 MG tablet Commonly known as: LEVSIN Take 1 tablet (0.125 mg total) by mouth every 6 (six) hours as needed.   LORazepam 0.5 MG tablet Commonly known as: ATIVAN Take 1 tablet (0.5 mg total) by mouth 2 (two) times daily as needed for anxiety. What changed:  how much to take how to take this when to take this reasons to take this additional instructions   mometasone 0.1 % lotion Commonly known as: ELOCON Apply 1 application topically daily as needed (ear irritation).   montelukast 10 MG tablet Commonly known as: SINGULAIR TAKE 1 TABLET BY MOUTH EVERYDAY AT BEDTIME   ondansetron 8 MG disintegrating tablet Commonly known as: ZOFRAN-ODT TAKE 1 TABLET BY MOUTH EVERY 8 HOURS AS NEEDED FOR NAUSEA OR VOMITING.   pantoprazole 40 MG tablet Commonly known as: PROTONIX Take 1 tablet (40 mg total) by mouth 2 (two) times daily.   potassium chloride SA 20 MEQ tablet Commonly known as: KLOR-CON M Take by mouth.   Probiotic Caps Take 1 capsule by mouth daily.   rosuvastatin 10 MG tablet Commonly known as: CRESTOR TAKE 1 TABLET BY MOUTH THREE TIMES A WEEK.   THERATEARS OP Place 1 drop into both eyes 2 (two) times daily.   tiZANidine 4 MG tablet Commonly known as: ZANAFLEX Take 1 tablet (4 mg total) by mouth every 6 (six) hours as needed for muscle spasms.   vitamin C 1000 MG tablet Take 3,000 mg by mouth daily.   Vitamin D 125 MCG (5000 UT) Caps Take 5,000 Units by mouth daily.   Wixela Inhub 250-50 MCG/ACT Aepb Generic drug: fluticasone-salmeterol INHALE 1 PUFF INTO THE LUNGS IN THE MORNING AND AT BEDTIME.   zinc gluconate 50 MG  tablet Take 100 mg by mouth daily.   zolpidem 5 MG tablet Commonly known as: AMBIEN Take 1 tablet (5 mg total) by mouth at bedtime. As needed        All past medical history, surgical history, allergies, family history, immunizations andmedications were updated in the EMR today and reviewed under the history and medication portions of their EMR.     ROS Negative, with the exception of above mentioned in HPI   Objective:  BP 129/78   Pulse 83   Temp 97.7 F (  36.5 C) (Oral)   Wt 125 lb (56.7 kg)   SpO2 99%   BMI 25.25 kg/m  Body mass index is 25.25 kg/m. Physical Exam Vitals and nursing note reviewed.  Constitutional:      General: She is not in acute distress.    Appearance: Normal appearance. She is not ill-appearing, toxic-appearing or diaphoretic.  HENT:     Head: Normocephalic and atraumatic.  Eyes:     General: No scleral icterus.       Right eye: No discharge.        Left eye: No discharge.     Extraocular Movements: Extraocular movements intact.     Conjunctiva/sclera: Conjunctivae normal.     Pupils: Pupils are equal, round, and reactive to light.  Cardiovascular:     Rate and Rhythm: Normal rate and regular rhythm.  Pulmonary:     Effort: Pulmonary effort is normal. No respiratory distress.     Breath sounds: Normal breath sounds. No wheezing, rhonchi or rales.  Musculoskeletal:     Right lower leg: No edema.     Left lower leg: No edema.  Skin:    General: Skin is warm.     Findings: No rash.  Neurological:     Mental Status: She is alert and oriented to person, place, and time. Mental status is at baseline.     Motor: No weakness.     Gait: Gait normal.  Psychiatric:        Mood and Affect: Mood normal.        Behavior: Behavior normal.        Thought Content: Thought content normal.        Judgment: Judgment normal.    No results found. No results found. No results found for this or any previous visit (from the past 24  hour(s)).  Assessment/Plan: Rita Levine is a 72 y.o. female present for OV for  Hypokalemia - Comp Met (CMET)  Situational anxiety /grief Patient's husband passed away 3 days ago after being in the ICU for 3 weeks with complications status post COVID. She has a good support system and feels that she is coping appropriately She reports the Ativan 0.5 mg has been helpful.  Refilled Ativan 0.5 mg twice daily as needed for her today, with refills. Follow-up in 5.5 months if needing refills, sooner if needed  Pneumonia due to COVID-19 virus Patient reports she feels her COVID symptoms have completely resolved now.   Reviewed expectations re: course of current medical issues. Discussed self-management of symptoms. Outlined signs and symptoms indicating need for more acute intervention. Patient verbalized understanding and all questions were answered. Patient received an After-Visit Summary. Return for See you in [place] for combined physical and chronic medical conditions appt. .    Orders Placed This Encounter  Procedures   Comp Met (CMET)   Meds ordered this encounter  Medications   LORazepam (ATIVAN) 0.5 MG tablet    Sig: Take 1 tablet (0.5 mg total) by mouth 2 (two) times daily as needed for anxiety.    Dispense:  60 tablet    Refill:  5   Referral Orders  No referral(s) requested today     Note is dictated utilizing voice recognition software. Although note has been proof read prior to signing, occasional typographical errors still can be missed. If any questions arise, please do not hesitate to call for verification.   electronically signed by:  Felix Pacini, DO  [facility] - [place]

## 2023-06-30 ENCOUNTER — Encounter: Payer: Self-pay | Admitting: Family Medicine

## 2023-06-30 ENCOUNTER — Telehealth (INDEPENDENT_AMBULATORY_CARE_PROVIDER_SITE_OTHER): Payer: Medicare HMO | Admitting: Family Medicine

## 2023-06-30 DIAGNOSIS — B3781 Candidal esophagitis: Secondary | ICD-10-CM | POA: Diagnosis not present

## 2023-06-30 MED ORDER — FLUCONAZOLE 200 MG PO TABS
200.0000 mg | ORAL_TABLET | Freq: Every day | ORAL | 0 refills | Status: DC
Start: 2023-06-30 — End: 2023-10-22

## 2023-06-30 NOTE — Progress Notes (Signed)
Virtual Visit via Video Note  I connected with Rita Levine on 06/30/23 at 4:48 PM by a video enabled telemedicine application and verified that I am speaking with the correct person using two identifiers.  Patient location: home, by self My location: office - [place].    I discussed the limitations, risks, security and privacy concerns of performing an evaluation and management service by telephone and the availability of in person appointments. I also discussed with the patient that there may be a patient responsible charge related to this service. The patient expressed understanding and agreed to proceed, consent obtained  Chief complaint:  Chief Complaint  Patient presents with   throat     White patches in throat, had it before and did not completely go away. Uses a lot of inhalers and noticed it has not completely went away     History of Present Illness: Rita Levine is a 72 y.o. female  White patches of throat:  Symptoms started a few days prior to 8/30 ER visit. Felt like soreness into throat that was worsening. Prior hx of esophagitis - GI Dr. Christella Hartigan, treated with oral antifungals. 02/05/2022 endoscopy noted - white plaques throughout esophagus, consistent with candidiasis.  Repeat treatment in June 2023 - 10 days of 100mg  diflucan - resolved sx's until recently.  Has been on steroids, nebulizers recently, with inhaled steroids, as well as 2 rounds of antibiotics. Not on oral prednisone or abx at this time, but still on ICS with Wixela inhub 250/50.  Note reviewed from PCP visit on September 9.  She had been treated with Diflucan 200 mg x 7 days and Valtrex for mouth ulcers and thrush.  Clear vesicular lesions on the soft palate on ED visit 06/13/2023.  HSV swab was obtained and started on valacyclovir, Diflucan at that time.  HSV 1 and 2 swab negative.  Throat sx's were improving at time of PCP visit, but never completely resolved, worsened again few days ago - more  soreness in throat, white patch noted in back of throat. Not as bad as in ER but starting to progress.  Able to clear secretions, fluid and swallow food.  No oral blisters seen.  No fever.     Patient Active Problem List   Diagnosis Date Noted   Pneumonia due to COVID-19 virus 06/23/2023   Situational anxiety 06/23/2023   Hypokalemia 06/23/2023   Kidney lesion, native, right 03/19/2023   Generalized abdominal pain 02/20/2023   Nausea and vomiting 02/20/2023   Abnormal brain scan 11/21/2022   Acute shoulder bursitis, left 09/27/2022   Polyarthralgia 08/01/2022   Hepatic steatosis 06/04/2022   Pulmonary nodule-3 mm right upper lobe nodule-stable; no further evaluation required 04/24/2022   Primary osteoarthritis of right knee 03/04/2022   Upper airway cough syndrome 09/13/2021   Weight loss counseling, encounter for 03/26/2021   Coronary artery calcification of native artery 01/16/2021   Trigger finger of all digits of both hands 01/11/2021   Head movements abnormal 10/16/2020   Tremor 10/16/2020   Acne 06/02/2020   Seborrheic dermatitis of scalp 06/02/2020   Aortic atherosclerosis (HCC) 02/03/2020   Multiple food allergies 12/03/2019   IBS (irritable bowel syndrome) 12/03/2019   DDD (degenerative disc disease), thoracolumbar 12/03/2019   Lumbar stenosis 12/03/2019   Sleep disturbance 12/03/2019   Fibromyalgia    GERD (gastroesophageal reflux disease)    Hypertension    Hypothyroidism    Osteoarthritis    Osteopenia    Thyroid nodule- stable. no further  f/u required    Seasonal allergies    Palpitations 04/07/2014   Mixed hyperlipidemia 04/07/2014   Asthma, intrinsic 04/01/2011   Past Medical History:  Diagnosis Date   Allergic rhinitis    Allergic rhinitis due to pollen 12/03/2019   Allergy    Anemia    Asthma    Cataract    Chest pain, unspecified 04/07/2014   Chicken pox    Colon polyp    Diverticulosis    Dysphagia    Dysrhythmia    Palpitations    Fibromyalgia    Food allergy    Gallstone    GERD (gastroesophageal reflux disease)    Hiatal hernia    Hypercholesteremia    Hypertension    Hypothyroidism    IBS (irritable bowel syndrome)    Ingrown toenail 08/23/2020   Migraine headache    occasionally   Morton neuroma, 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   Osteoarthritis    Osteopenia    Pneumonia    PONV (postoperative nausea and vomiting)    Pulmonary hypertension (HCC)    pt denies, pt states she was tested for it but was not diagnosed with it   Rectal bleeding    SBO (small bowel obstruction) (HCC) 10/20/2017   Seasonal allergies    Subconjunctival hemorrhage of left eye 08/01/2022   Thyroid nodule    Trigger finger of left and right ring fingers 01/11/2021   Past Surgical History:  Procedure Laterality Date   APPENDECTOMY  1982   BREAST EXCISIONAL BIOPSY Right 1995   benign   BREAST LUMPECTOMY  1991   CARPAL TUNNEL RELEASE Right 2006   Right wrist   CESAREAN SECTION     x 2   CHOLECYSTECTOMY N/A 07/25/2020   Procedure: LAPAROSCOPIC CHOLECYSTECTOMY;  Surgeon: Almond Lint, MD;  Location: [facility] OR;  Service: General;  Laterality: N/A;   COLON RESECTION  1990   COLONOSCOPY     CT ABDOMEN PELVIS W ([facility] HX)  01/03/2020   IMAGE MRI brain:  04/2012   No focal IAC or inner ear lesion to explain hearing loss. Slightly greater than expected number of subcortical T2- hyperintensities bilaterally.  These are nonspecific.  They can be seen in the setting of chronic migraine headaches, demyelinating process, chronic microvascular ischemic, prior infection or inflammation, or vasculitis   IMAGE MRI lumbar:  05/01/2006   Mild central canal stenosis with facet arthropathy and mildly bulging disc at L4-5.  There is mild narrowing in the left lateral recess at this level.  No definite neural impingement.  Appearance at this level has not markedly Moderately severe facet arthropathy at L5-S1 with mild interval progression of a diffuse broad based disc  bulge.  There is mild biforaminal narrowing.  Central canal is open   LAPAROSCOPIC ABDOMINAL EXPLORATION  2019   adhesion removal> caused bowel blockage    NASAL SEPTUM SURGERY  2004   TONSILLECTOMY  1965   TOTAL ABDOMINAL HYSTERECTOMY W/ BILATERAL SALPINGOOPHORECTOMY  1988   TUBAL LIGATION  1974   UPPER GASTROINTESTINAL ENDOSCOPY  2020   Allergies  Allergen Reactions   Sulfamethoxazole-Trimethoprim Hives   Avelox [Moxifloxacin Hcl In Nacl]     Racing heart   Cephalexin Hives   Codeine Hives    Heart racing   Cymbalta [Duloxetine Hcl]    Erythromycin Hives   Flagyl [Metronidazole]     Unknown reaction   Paxlovid [Nirmatrelvir-Ritonavir]    Propranolol Hives   Sulfa Antibiotics Hives   Tetanus Toxoid Swelling    Reports a  fever and headache with swelling.    Tramadol Hives   Statins Other (See Comments)    Myalgia    Prior to Admission medications   Medication Sig Start Date End Date Taking? Authorizing Provider  albuterol (PROAIR HFA) 108 (90 Base) MCG/ACT inhaler Inhale 2 puffs into the lungs every 6 (six) hours as needed. 10/02/22  Yes Kuneff, Renee A, DO  albuterol (PROVENTIL) (2.5 MG/3ML) 0.083% nebulizer solution Take 3 mLs (2.5 mg total) by nebulization every 6 (six) hours as needed. 10/02/22  Yes Kuneff, Renee A, DO  ARMOUR THYROID 15 MG tablet Take 1 tablet by mouth in the AM Once a day 04/30/23  Yes Kuneff, Renee A, DO  ARMOUR THYROID 30 MG tablet Take 1 tablet by mouth in the AM once a day 04/30/23  Yes Kuneff, Renee A, DO  Ascorbic Acid (VITAMIN C) 1000 MG tablet Take 3,000 mg by mouth daily.   Yes [provider]  atenolol (TENORMIN) 25 MG tablet Take 0.5-1 tablets (12.5-25 mg total) by mouth daily. 04/29/23  Yes Kuneff, Renee A, DO  budesonide (PULMICORT) 0.5 MG/2ML nebulizer solution TAKE 2 MLS (0.5 MG TOTAL) BY NEBULIZATION IN THE MORNING AND AT BEDTIME. 03/28/23  Yes Cobb, Ruby Cola, NP  calcium carbonate (TUMS - DOSED IN MG ELEMENTAL CALCIUM) 500 MG  chewable tablet Chew 3 tablets by mouth daily as needed for indigestion or heartburn.   Yes [provider]  Carboxymethylcellulose Sodium (THERATEARS OP) Place 1 drop into both eyes 2 (two) times daily.   Yes [provider]  Cholecalciferol (VITAMIN D) 125 MCG (5000 UT) CAPS Take 5,000 Units by mouth daily.   Yes [provider]  clobetasol (TEMOVATE) 0.05 % external solution Apply 1 Application topically 2 (two) times daily. 10/02/22  Yes Kuneff, Renee A, DO  clotrimazole (MYCELEX) 10 MG troche Take 1 tablet (10 mg total) by mouth 4 (four) times daily as needed. 03/19/22  Yes Nyoka Cowden, MD  Coenzyme Q10 (COQ-10) 100 MG CAPS Take 100 mg by mouth daily.   Yes [provider]  ezetimibe (ZETIA) 10 MG tablet TAKE 1 TABLET BY MOUTH EVERY DAY 06/23/23  Yes Corky Crafts, MD  famotidine (PEPCID) 40 MG tablet TAKE 1 TABLET BY MOUTH TWICE A DAY 06/12/23  Yes Unk Lightning, PA  fexofenadine (ALLERGY RELIEF) 180 MG tablet Take 1 tablet (180 mg total) by mouth daily. 04/29/23  Yes Kuneff, Renee A, DO  fluconazole (DIFLUCAN) 200 MG tablet Take 200 mg by mouth daily. 06/13/23  Yes [provider]  fluticasone (FLONASE) 50 MCG/ACT nasal spray Place 2 sprays into both nostrils daily. 04/29/23  Yes Kuneff, Renee A, DO  folic acid (FOLVITE) 1 MG tablet SMARTSIG:2 Tablet(s) By Mouth 04/19/21  Yes [provider]  hydrochlorothiazide (MICROZIDE) 12.5 MG capsule Take 1 capsule (12.5 mg total) by mouth daily as needed. 04/29/23  Yes Kuneff, Renee A, DO  hyoscyamine (LEVSIN) 0.125 MG tablet Take 1 tablet (0.125 mg total) by mouth every 6 (six) hours as needed. 10/02/22  Yes Kuneff, Renee A, DO  LORazepam (ATIVAN) 0.5 MG tablet Take 1 tablet (0.5 mg total) by mouth 2 (two) times daily as needed for anxiety. 06/23/23  Yes Kuneff, Renee A, DO  mometasone (ELOCON) 0.1 % lotion Apply 1 application topically daily as needed (ear irritation).  07/09/20  Yes [provider]  montelukast (SINGULAIR) 10 MG tablet TAKE 1 TABLET BY MOUTH EVERYDAY AT BEDTIME 04/29/23  Yes Kuneff, Renee A, DO  ondansetron (ZOFRAN-ODT) 8 MG disintegrating tablet TAKE 1 TABLET BY MOUTH EVERY 8 HOURS AS NEEDED FOR NAUSEA OR VOMITING. 03/28/23  Yes Unk Lightning, PA  pantoprazole (PROTONIX) 40 MG tablet Take 1 tablet (40 mg total) by mouth 2 (two) times daily. 04/29/23  Yes Kuneff, Renee A, DO  potassium chloride SA (KLOR-CON M) 20 MEQ tablet Take by mouth. 06/03/23  Yes [provider]  Probiotic CAPS Take 1 capsule by mouth daily.   Yes [provider]  rosuvastatin (CRESTOR) 10 MG tablet TAKE 1 TABLET BY MOUTH THREE TIMES A WEEK. 05/16/23  Yes Corky Crafts, MD  tiZANidine (ZANAFLEX) 4 MG tablet Take 1 tablet (4 mg total) by mouth every 6 (six) hours as needed for muscle spasms. 04/29/23  Yes Kuneff, Renee A, DO  WIXELA INHUB 250-50 MCG/ACT AEPB INHALE 1 PUFF INTO THE LUNGS IN THE MORNING AND AT BEDTIME. 06/20/23  Yes Cobb, Ruby Cola, NP  zinc gluconate 50 MG tablet Take 100 mg by mouth daily.   Yes [provider]  zolpidem (AMBIEN) 5 MG tablet Take 1 tablet (5 mg total) by mouth at bedtime. As needed 04/29/23  Yes Kuneff, Renee A, DO   Social History   Socioeconomic History   Marital status: Widowed    Spouse name: Celesta Aver   Number of children: 2   Years of education: Not on file   Highest education level: 12th grade  Occupational History   Occupation: homemaker  Tobacco Use   Smoking status: Never    Passive exposure: Past   Smokeless tobacco: Never  Vaping Use   Vaping status: Never Used  Substance and Sexual Activity   Alcohol use: Yes    Comment: very rarely   Drug use: No   Sexual activity: Yes    Partners: Male    Comment: married  Other Topics Concern   Not on file  Social History Narrative   Marital status/children/pets: married, 2 children   Education/employment: HS grad. retired   Field seismologist:      -smoke  alarm in the home:Yes     - wears seatbelt: Yes     - Feels safe in their relationships: Yes   Social Determinants of Health   Financial Resource Strain: Low Risk  (03/18/2023)   Overall Financial Resource Strain (CARDIA)    Difficulty of Paying Living Expenses: Not hard at all  Food Insecurity: No Food Insecurity (03/18/2023)   Hunger Vital Sign    Worried About Running Out of Food in the Last Year: Never true    Ran Out of Food in the Last Year: Never true  Transportation Needs: No Transportation Needs (03/18/2023)   PRAPARE - Administrator, Civil Service (Medical): No    Lack of Transportation (Non-Medical): No  Physical Activity: Sufficiently Active (03/18/2023)   Exercise Vital Sign    Days of Exercise per Week: 5 days    Minutes of Exercise per Session: 30 min  Stress: No Stress Concern Present (03/18/2023)   [employer] of Occupational Health - Occupational Stress Questionnaire    Feeling of Stress : Only a little  Social Connections: Socially Integrated (03/18/2023)   Social Connection and Isolation Panel [NHANES]    Frequency of Communication with Friends and Family: More than three times a week    Frequency of Social Gatherings with Friends and Family: Once a week    Attends Religious Services: More than 4 times per year    Active Member of [employer]  or Organizations: Yes    Attends Banker Meetings: More than 4 times per year    Marital Status: Married  Catering manager Violence: Not At Risk (06/13/2023)   Received from [facility]   HITS    Over the last 12 months how often did your partner physically hurt you?: 1    Over the last 12 months how often did your partner insult you or talk down to you?: 1    Over the last 12 months how often did your partner threaten you with physical harm?: 1    Over the last 12 months how often did your partner scream or curse at you?: 1    Observations/Objective: There were no vitals filed for this  visit.  Nontoxic appearance on video, normal speech, no audible stridor, no wheeze appreciated.  Speaking in full sentences without distress or respiratory distress.  All questions were answered with understanding of plan expressed.  Assessment and Plan: Esophageal candidiasis (HCC) - Plan: fluconazole (DIFLUCAN) 200 MG tablet  -Likely recurrence with inhaled corticosteroids, oral corticosteroids and antibiotics.  -Improved symptoms with ER treatment but never completely resolved and now worsening.  Suspected continued esophageal candidiasis with incomplete treatment but would not classify as treatment failure as she was improving during the 1 week course of Diflucan.  Will restart Diflucan at 400 mg x 1 then 200 mg daily for a 2-week course but if not improving within 1 week would recommend possible repeat endoscopy or higher dosing Diflucan.  RTC precautions given.  Follow Up Instructions:  As needed.   Patient Instructions  Sorry to hear that you are still having problems with the esophageal irritation.  I do think this sounds like persistent esophageal candidiasis, and may just require a longer course of Diflucan.  Restart at 400 mg for the first dose, then 200 mg/day for 2 weeks straight.  As long as your symptoms are improving into that first week I think this will take care of things.  If you are not improving, let us know and we can look at other treatments or possible repeat endoscopy.  Hang in there.  Dr. Neva Seat    I discussed the assessment and treatment plan with the patient. The patient was provided an opportunity to ask questions and all were answered. The patient agreed with the plan and demonstrated an understanding of the instructions.   The patient was advised to call back or seek an in-person evaluation if the symptoms worsen or if the condition fails to improve as anticipated.   Shade Flood, MD

## 2023-06-30 NOTE — Patient Instructions (Addendum)
Sorry to hear that you are still having problems with the esophageal irritation.  I do think this sounds like persistent esophageal candidiasis, and may just require a longer course of Diflucan.  Restart at 400 mg for the first dose, then 200 mg/day for 2 weeks straight.  As long as your symptoms are improving into that first week I think this will take care of things.  If you are not improving, let us know and we can look at other treatments or possible repeat endoscopy.  Hang in there.  Dr. Neva Seat  Esophagitis  Esophagitis is inflammation of the esophagus. The esophagus is the tube that carries food from the mouth to the stomach. Esophagitis can cause soreness or pain in the esophagus. This condition can make it difficult and painful to swallow. What are the causes? Most causes of esophagitis are not serious. Common causes of this condition include: Gastroesophageal reflux disease (GERD). This is when stomach contents move back up into the esophagus (reflux). Repeated vomiting. An allergic reaction, especially caused by food allergies (eosinophilic esophagitis). Injury to the esophagus by swallowing large pills with or without water, or swallowing certain types of medicines. Swallowing harmful chemicals, such as household cleaning products. Drinking a lot of alcohol. An infection of the esophagus. This most often occurs in people who have a weakened immune system. Radiation or chemotherapy treatment for cancer. Certain diseases such as sarcoidosis, Crohn's disease, and scleroderma. What are the signs or symptoms? Symptoms of this condition include: Difficult or painful swallowing. Pain with swallowing acidic liquids, such as citrus juices. You may also have pain when you burp. Chest pain and difficulty breathing. Nausea and vomiting. Pain in the abdomen. Weight loss. Ulcers in the mouth and white patches in the mouth (candidiasis). Fever. Coughing up blood or vomiting blood. Stool that  is black, tarry, or bright red. How is this diagnosed? This condition may be diagnosed based on your medical history and a physical exam. You may also have other tests, including: A test to examine your esophagus and stomach with a small flexible tube with a camera (endoscopy). A test that measures the acidity level in your esophagus. A test that measures how much pressure is on your esophagus. A barium swallow or modified barium swallow to show the shape, size, and functioning of your esophagus. Allergy tests. How is this treated? Treatment for this condition depends on the cause of your esophagitis. In some cases, steroids or other medicines may be given to help relieve your symptoms or to treat the underlying cause of your condition. You may have to make some lifestyle changes, such as: Avoiding alcohol. Quitting any products that contain nicotine or tobacco. These products include cigarettes, chewing tobacco, and vaping devices, such as e-cigarettes. If you need help quitting, ask your health care provider. Changing your diet. Exercising. Changing your sleep habits and your sleep environment. Follow these instructions at home: Medicines Take over-the-counter and prescription medicines only as told by your health care provider. Do not take aspirin, ibuprofen, or other NSAIDs unless your health care provider told you to do so. If you have trouble taking pills: Use a pill splitter to decrease the size of the pill. This will decrease the chance of the pill getting stuck or injuring your esophagus. Drink water after you take a pill. Eating and drinking  Avoid foods and drinks that seem to make your symptoms worse. Follow a diet as recommended by your health care provider. This may involve avoiding foods and drinks  such as: Coffee and tea, with or without caffeine. Drinks that contain alcohol. Energy drinks and sports drinks. Carbonated drinks or sodas. Chocolate and cocoa. Peppermint  and mint flavorings. Garlic and onions. Horseradish. Spicy and acidic foods, including peppers, chili powder, curry powder, vinegar, hot sauces, and barbecue sauce. Citrus fruit juices and citrus fruits, such as oranges, lemons, and limes. Tomato-based foods, such as red sauce, chili, salsa, and pizza with red sauce. Fried and fatty foods, such as donuts, french fries, potato chips, and high-fat dressings. High-fat meats, such as hot dogs and fatty cuts of red and white meats, such as rib eye steak, sausage, ham, and bacon. High-fat dairy items, such as whole milk, butter, and cream cheese. Lifestyle Eat small, frequent meals instead of large meals. Avoid drinking large amounts of liquid with your meals. Avoid eating meals during the 2-3 hours before bedtime. Avoid lying down right after you eat. Do not exercise right after you eat. Do not use any products that contain nicotine or tobacco. These products include cigarettes, chewing tobacco, and vaping devices, such as e-cigarettes. If you need help quitting, ask your health care provider. General instructions  Pay attention to any changes in your symptoms. Let your health care provider know about them. Wear loose-fitting clothing. Do not wear anything tight around your waist that causes pressure on your abdomen. Raise (elevate) the head of your bed about 6 inches (15 cm). You may need to use a wedge to do this. Try relaxation strategies such as yoga, deep breathing, or meditation to manage stress. If you need help reducing stress, ask your health care provider. If you are overweight, reduce your weight to an amount that is healthy for you. Ask your health care provider for guidance about a safe weight loss goal. Keep all follow-up visits. This is important. Contact a health care provider if: You have new symptoms. You have unexplained weight loss. You have difficulty swallowing, or it hurts to swallow. You have wheezing or a cough that  does not go away. Your symptoms do not improve with treatment. You have frequent heartburn for more than two weeks. Get help right away if: You have sudden severe pain in your arms, neck, jaw, teeth, or back. You suddenly feel sweaty, dizzy, or light-headed. You have chest pain or shortness of breath. You vomit and the vomit is green, yellow, or black, or it looks like blood or coffee grounds. Your stool is red, bloody, or black. You have a fever. You cannot swallow, drink, or eat. These symptoms may represent a serious problem that is an emergency. Do not wait to see if the symptoms will go away. Get medical help right away. Call your local emergency services (911 in the [place] Do not drive yourself to the hospital. Summary Esophagitis is inflammation of the esophagus. Most causes of esophagitis are not serious. Follow your health care provider's instructions about eating and drinking. Contact a health care provider if you have new symptoms, have weight loss, or coughing that does not stop. Get help right away if you have severe pain in the arms, neck, jaw, teeth, or back, or if you have chest pain, shortness of breath, or fever. This information is not intended to replace advice given to you by your health care provider. Make sure you discuss any questions you have with your health care provider. Document Revised: 04/10/2020 Document Reviewed: 04/10/2020 Elsevier Patient Education  2024 [employer].

## 2023-07-01 ENCOUNTER — Telehealth: Payer: Medicare HMO | Admitting: Physician Assistant

## 2023-07-01 DIAGNOSIS — H25813 Combined forms of age-related cataract, bilateral: Secondary | ICD-10-CM | POA: Diagnosis not present

## 2023-07-01 DIAGNOSIS — H16223 Keratoconjunctivitis sicca, not specified as Sjogren's, bilateral: Secondary | ICD-10-CM | POA: Diagnosis not present

## 2023-07-01 DIAGNOSIS — Z0100 Encounter for examination of eyes and vision without abnormal findings: Secondary | ICD-10-CM | POA: Diagnosis not present

## 2023-07-09 ENCOUNTER — Ambulatory Visit: Payer: Medicare HMO

## 2023-07-09 VITALS — Wt 125.0 lb

## 2023-07-09 DIAGNOSIS — E2839 Other primary ovarian failure: Secondary | ICD-10-CM | POA: Diagnosis not present

## 2023-07-09 DIAGNOSIS — Z0000 Encounter for general adult medical examination without abnormal findings: Secondary | ICD-10-CM

## 2023-07-09 NOTE — Patient Instructions (Signed)
Rita Levine , Thank you for taking time to come for your Medicare Wellness Visit. I appreciate your ongoing commitment to your health goals. Please review the following plan we discussed and let me know if I can assist you in the future.   Referrals/Orders/Follow-Ups/Clinician Recommendations: get in better shape and firm up , declined flu vaccine, ordered bone scan   This is a list of the screening recommended for you and due dates:  Health Maintenance  Topic Date Due   DEXA scan (bone density measurement)  01/04/2023   Flu Shot  01/12/2024*   DTaP/Tdap/Td vaccine (2 - Td or Tdap) 05/02/2024   Medicare Annual Wellness Visit  07/08/2024   Mammogram  10/31/2024   Colon Cancer Screening  08/05/2026   Pneumonia Vaccine  Completed   Hepatitis C Screening  Completed   HPV Vaccine  Aged Out   COVID-19 Vaccine  Discontinued   Zoster (Shingles) Vaccine  Discontinued  *Topic was postponed. The date shown is not the original due date.    Advanced directives: (Copy Requested) Please bring a copy of your health care power of attorney and living will to the office to be added to your chart at your convenience.  Next Medicare Annual Wellness Visit scheduled for next year: Yes

## 2023-07-09 NOTE — Progress Notes (Signed)
Subjective:   Rita Levine is a 72 y.o. female who presents for Medicare Annual (Subsequent) preventive examination.  Visit Complete: Virtual  I connected with  [name] on 07/09/23 by a audio enabled telemedicine application and verified that I am speaking with the correct person using two identifiers.  Patient Location: Home  Provider Location: Home Office  I discussed the limitations of evaluation and management by telemedicine. The patient expressed understanding and agreed to proceed.    Because this visit was a virtual/telehealth visit, some criteria may be missing or patient reported. Any vitals not documented were not able to be obtained and vitals that have been documented are patient reported.    Cardiac Risk Factors include: advanced age (>7men, >66 women);hypertension;dyslipidemia     Objective:    Today's Vitals   07/09/23 1405  Weight: 125 lb (56.7 kg)   Body mass index is 25.25 kg/m.     07/09/2023    2:08 PM 11/21/2022    8:37 AM 06/27/2022    1:19 PM 04/28/2021    9:16 AM 10/23/2020    9:46 AM 07/21/2020    8:30 AM 08/05/2016    7:38 AM  Advanced Directives  Does Patient Have a Medical Advance Directive? Yes Yes Yes No Yes Yes Yes  Type of Estate agent of [place];Living will Living will Healthcare Power of [place];Living will  Healthcare Power of [place];Living will Healthcare Power of [place];Living will   Does patient want to make changes to medical advance directive?      No - Patient declined   Copy of Healthcare Power of Attorney in Chart? No - copy requested     No - copy requested No - copy requested  Would patient like information on creating a medical advance directive?    No - Patient declined       Current Medications (verified) Outpatient Encounter Medications as of 07/09/2023  Medication Sig   albuterol (PROAIR HFA) 108 (90 Base) MCG/ACT inhaler Inhale 2 puffs into the lungs every 6 (six) hours as needed.    albuterol (PROVENTIL) (2.5 MG/3ML) 0.083% nebulizer solution Take 3 mLs (2.5 mg total) by nebulization every 6 (six) hours as needed.   ARMOUR THYROID 15 MG tablet Take 1 tablet by mouth in the AM Once a day   ARMOUR THYROID 30 MG tablet Take 1 tablet by mouth in the AM once a day   Ascorbic Acid (VITAMIN C) 1000 MG tablet Take 3,000 mg by mouth daily.   atenolol (TENORMIN) 25 MG tablet Take 0.5-1 tablets (12.5-25 mg total) by mouth daily.   budesonide (PULMICORT) 0.5 MG/2ML nebulizer solution TAKE 2 MLS (0.5 MG TOTAL) BY NEBULIZATION IN THE MORNING AND AT BEDTIME.   calcium carbonate (TUMS - DOSED IN MG ELEMENTAL CALCIUM) 500 MG chewable tablet Chew 3 tablets by mouth daily as needed for indigestion or heartburn.   Carboxymethylcellulose Sodium (THERATEARS OP) Place 1 drop into both eyes 2 (two) times daily.   Cholecalciferol (VITAMIN D) 125 MCG (5000 UT) CAPS Take 5,000 Units by mouth daily.   clobetasol (TEMOVATE) 0.05 % external solution Apply 1 Application topically 2 (two) times daily.   clotrimazole (MYCELEX) 10 MG troche Take 1 tablet (10 mg total) by mouth 4 (four) times daily as needed.   Coenzyme Q10 (COQ-10) 100 MG CAPS Take 100 mg by mouth daily.   ezetimibe (ZETIA) 10 MG tablet TAKE 1 TABLET BY MOUTH EVERY DAY   famotidine (PEPCID) 40 MG tablet TAKE  1 TABLET BY MOUTH TWICE A DAY   fexofenadine (ALLERGY RELIEF) 180 MG tablet Take 1 tablet (180 mg total) by mouth daily.   fluconazole (DIFLUCAN) 200 MG tablet Take 1 tablet (200 mg total) by mouth daily. Initial dose 400mg  po x 1, then 200mg  po every day for 14 days total.   fluticasone (FLONASE) 50 MCG/ACT nasal spray Place 2 sprays into both nostrils daily.   folic acid (FOLVITE) 1 MG tablet SMARTSIG:2 Tablet(s) By Mouth   hydrochlorothiazide (MICROZIDE) 12.5 MG capsule Take 1 capsule (12.5 mg total) by mouth daily as needed.   hyoscyamine (LEVSIN) 0.125 MG tablet Take 1 tablet (0.125 mg total) by mouth every 6 (six) hours as needed.    LORazepam (ATIVAN) 0.5 MG tablet Take 1 tablet (0.5 mg total) by mouth 2 (two) times daily as needed for anxiety.   mometasone (ELOCON) 0.1 % lotion Apply 1 application topically daily as needed (ear irritation).    montelukast (SINGULAIR) 10 MG tablet TAKE 1 TABLET BY MOUTH EVERYDAY AT BEDTIME   ondansetron (ZOFRAN-ODT) 8 MG disintegrating tablet TAKE 1 TABLET BY MOUTH EVERY 8 HOURS AS NEEDED FOR NAUSEA OR VOMITING.   pantoprazole (PROTONIX) 40 MG tablet Take 1 tablet (40 mg total) by mouth 2 (two) times daily.   potassium chloride SA (KLOR-CON M) 20 MEQ tablet Take by mouth.   Probiotic CAPS Take 1 capsule by mouth daily.   rosuvastatin (CRESTOR) 10 MG tablet TAKE 1 TABLET BY MOUTH THREE TIMES A WEEK.   tiZANidine (ZANAFLEX) 4 MG tablet Take 1 tablet (4 mg total) by mouth every 6 (six) hours as needed for muscle spasms.   WIXELA INHUB 250-50 MCG/ACT AEPB INHALE 1 PUFF INTO THE LUNGS IN THE MORNING AND AT BEDTIME.   zinc gluconate 50 MG tablet Take 100 mg by mouth daily.   zolpidem (AMBIEN) 5 MG tablet Take 1 tablet (5 mg total) by mouth at bedtime. As needed   No facility-administered encounter medications on file as of 07/09/2023.    Allergies (verified) Sulfamethoxazole-trimethoprim, Avelox [moxifloxacin hcl in nacl], Cephalexin, Codeine, Cymbalta [duloxetine hcl], Erythromycin, Flagyl [metronidazole], Paxlovid [nirmatrelvir-ritonavir], Propranolol, Sulfa antibiotics, Tetanus toxoid, Tramadol, and Statins   History: Past Medical History:  Diagnosis Date   Allergic rhinitis    Allergic rhinitis due to pollen 12/03/2019   Allergy    Anemia    Asthma    Cataract    Chest pain, unspecified 04/07/2014   Chicken pox    Colon polyp    Diverticulosis    Dysphagia    Dysrhythmia    Palpitations   Fibromyalgia    Food allergy    Gallstone    GERD (gastroesophageal reflux disease)    Hiatal hernia    Hypercholesteremia    Hypertension    Hypothyroidism    IBS (irritable bowel  syndrome)    Ingrown toenail 08/23/2020   Migraine headache    occasionally   Morton neuroma, 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   Osteoarthritis    Osteopenia    Pneumonia    PONV (postoperative nausea and vomiting)    Pulmonary hypertension (HCC)    pt denies, pt states she was tested for it but was not diagnosed with it   Rectal bleeding    SBO (small bowel obstruction) (HCC) 10/20/2017   Seasonal allergies    Subconjunctival hemorrhage of left eye 08/01/2022   Thyroid nodule    Trigger finger of left and right ring fingers 01/11/2021   Past Surgical History:  Procedure Laterality Date  APPENDECTOMY  1982   BREAST EXCISIONAL BIOPSY Right 1995   benign   BREAST LUMPECTOMY  1991   CARPAL TUNNEL RELEASE Right 2006   Right wrist   CESAREAN SECTION     x 2   CHOLECYSTECTOMY N/A 07/25/2020   Procedure: LAPAROSCOPIC CHOLECYSTECTOMY;  Surgeon: Almond Lint, MD;  Location: [facility] OR;  Service: General;  Laterality: N/A;   COLON RESECTION  1990   COLONOSCOPY     CT ABDOMEN PELVIS W ([facility] HX)  01/03/2020   IMAGE MRI brain:  04/2012   No focal IAC or inner ear lesion to explain hearing loss. Slightly greater than expected number of subcortical T2- hyperintensities bilaterally.  These are nonspecific.  They can be seen in the setting of chronic migraine headaches, demyelinating process, chronic microvascular ischemic, prior infection or inflammation, or vasculitis   IMAGE MRI lumbar:  05/01/2006   Mild central canal stenosis with facet arthropathy and mildly bulging disc at L4-5.  There is mild narrowing in the left lateral recess at this level.  No definite neural impingement.  Appearance at this level has not markedly Moderately severe facet arthropathy at L5-S1 with mild interval progression of a diffuse broad based disc bulge.  There is mild biforaminal narrowing.  Central canal is open   LAPAROSCOPIC ABDOMINAL EXPLORATION  2019   adhesion removal> caused bowel blockage    NASAL SEPTUM SURGERY  2004    TONSILLECTOMY  1965   TOTAL ABDOMINAL HYSTERECTOMY W/ BILATERAL SALPINGOOPHORECTOMY  1988   TUBAL LIGATION  1974   UPPER GASTROINTESTINAL ENDOSCOPY  2020   Family History  Problem Relation Age of Onset   Hyperlipidemia Father    Hypertension Father    Arthritis Father    Diabetes Father    Asthma Father    COPD Father    Early death Father    Parkinson's disease Father    Hypertension Mother    Hyperlipidemia Mother    Macular degeneration Mother    Arthritis Mother    Hearing loss Mother    Heart disease Mother    Kidney disease Mother    Throat cancer Brother        lung, and tongue   Arthritis Brother    COPD Brother    Diabetes type II Sister    COPD Sister    Heart disease Sister    Hypertension Sister    Thyroid cancer Sister    Lung cancer Sister    Early death Sister    COPD Sister    Breast cancer Maternal Aunt    Lung cancer Other        uncle   Emphysema Maternal Aunt    Emphysema Maternal Uncle    Clotting disorder Sister    Brain cancer Sister        brain tumor- not malignant   Breast cancer Cousin    Cancer Sister    Alcohol abuse Sister    COPD Sister    Early death Sister    Alcohol abuse Brother    Arthritis Brother    Depression Brother    Diabetes Brother    Hypercholesterolemia Brother    Colon cancer Neg Hx    Esophageal cancer Neg Hx    Rectal cancer Neg Hx    Stomach cancer Neg Hx    Social History   Socioeconomic History   Marital status: Widowed    Spouse name: Celesta Aver   Number of children: 2   Years of education: Not  on file   Highest education level: 12th grade  Occupational History   Occupation: homemaker  Tobacco Use   Smoking status: Never    Passive exposure: Past   Smokeless tobacco: Never  Vaping Use   Vaping status: Never Used  Substance and Sexual Activity   Alcohol use: Yes    Comment: very rarely   Drug use: No   Sexual activity: Yes    Partners: Male    Comment: married  Other Topics Concern    Not on file  Social History Narrative   Marital status/children/pets: married, 2 children   Education/employment: HS grad. retired   Field seismologist:      -smoke alarm in the home:Yes     - wears seatbelt: Yes     - Feels safe in their relationships: Yes   Social Determinants of Health   Financial Resource Strain: Low Risk  (07/09/2023)   Overall Financial Resource Strain (CARDIA)    Difficulty of Paying Living Expenses: Not hard at all  Food Insecurity: No Food Insecurity (07/09/2023)   Hunger Vital Sign    Worried About Running Out of Food in the Last Year: Never true    Ran Out of Food in the Last Year: Never true  Transportation Needs: No Transportation Needs (07/09/2023)   PRAPARE - Administrator, Civil Service (Medical): No    Lack of Transportation (Non-Medical): No  Physical Activity: Sufficiently Active (07/09/2023)   Exercise Vital Sign    Days of Exercise per Week: 5 days    Minutes of Exercise per Session: 30 min  Stress: No Stress Concern Present (07/09/2023)   [employer] of Occupational Health - Occupational Stress Questionnaire    Feeling of Stress : Only a little  Social Connections: Moderately Integrated (07/09/2023)   Social Connection and Isolation Panel [NHANES]    Frequency of Communication with Friends and Family: More than three times a week    Frequency of Social Gatherings with Friends and Family: Once a week    Attends Religious Services: More than 4 times per year    Active Member of [employer] or Organizations: Yes    Attends Banker Meetings: More than 4 times per year    Marital Status: Widowed    Tobacco Counseling Counseling given: Not Answered   Clinical Intake:  Pre-visit preparation completed: Yes  Pain : No/denies pain     BMI - recorded: 25.25 Nutritional Status: BMI 25 -29 Overweight Nutritional Risks: None Diabetes: No  How often do you need to have someone help you when you read instructions, pamphlets, or  other written materials from your doctor or pharmacy?: 1 - Never  Interpreter Needed?: No  Information entered by :: Lanier Ensign, LPN   Activities of Daily Living    07/09/2023    2:06 PM  In your present state of health, do you have any difficulty performing the following activities:  Hearing? 0  Vision? 0  Difficulty concentrating or making decisions? 0  Walking or climbing stairs? 0  Dressing or bathing? 0  Doing errands, shopping? 0  Preparing Food and eating ? N  Using the Toilet? N  In the past six months, have you accidently leaked urine? N  Do you have problems with loss of bowel control? N  Managing your Medications? N  Managing your Finances? N  Housekeeping or managing your Housekeeping? N    Patient Care Team: Natalia Leatherwood, DO as PCP - General (Family Medicine) Corky Crafts,  MD as PCP - Cardiology (Cardiology) Rachael Fee, MD as Attending Physician (Gastroenterology) Glyn Ade, PA-C as Physician Assistant (Dermatology) Daneen Schick, OD (Optometry)  Indicate any recent Medical Services you may have received from other than [facility] providers in the past year (date may be approximate).     Assessment:   This is a routine wellness examination for Nevin.  Hearing/Vision screen Hearing Screening - Comments:: Pt denies any hearing issues  Vision Screening - Comments:: Pt follows up with Dr Felizardo Hoffmann for annual eye exams    Goals Addressed             This Visit's Progress    Patient Stated       Pt stated firm up and get in better shape        Depression Screen    07/09/2023    2:12 PM 06/23/2023   11:54 AM 04/29/2023   10:07 AM 10/02/2022    8:21 AM 06/27/2022    1:17 PM 04/24/2022    8:50 AM 11/06/2021   11:04 AM  PHQ 2/9 Scores  PHQ - 2 Score 2 2 0 0 0 0 0  PHQ- 9 Score  11    5 1     Fall Risk    07/09/2023    2:09 PM 06/23/2023   11:54 AM 04/29/2023   10:07 AM 03/18/2023    2:12 PM 11/21/2022    8:37 AM  Fall Risk   Falls in  the past year? 0 0 0 0 0  Number falls in past yr: 0 0 0  0  Injury with Fall? 0 0 0  0  Risk for fall due to : No Fall Risks No Fall Risks     Follow up Falls prevention discussed Falls evaluation completed   Falls evaluation completed    MEDICARE RISK AT HOME: Medicare Risk at Home Any stairs in or around the home?: Yes If so, are there any without handrails?: No Home free of loose throw rugs in walkways, pet beds, electrical cords, etc?: Yes Adequate lighting in your home to reduce risk of falls?: Yes Life alert?: No Use of a cane, walker or w/c?: No Grab bars in the bathroom?: Yes Shower chair or bench in shower?: Yes Elevated toilet seat or a handicapped toilet?: No  TIMED UP AND GO:  Was the test performed?  No    Cognitive Function:    07/09/2023    2:10 PM  MMSE - Mini Mental State Exam  Not completed: Unable to complete;Refused        06/27/2022    1:20 PM 06/12/2021    6:15 PM  6CIT Screen  What Year? 0 points 0 points  What month? 0 points 0 points  What time? 0 points 0 points  Count back from 20 0 points 0 points  Months in reverse 0 points 0 points  Repeat phrase 0 points 0 points  Total Score 0 points 0 points    Immunizations Immunization History  Administered Date(s) Administered   Influenza-Unspecified 06/11/2019, 07/14/2020   Pneumococcal Conjugate-13 07/03/2016   Pneumococcal Polysaccharide-23 08/03/2019   Tdap 05/02/2014   Zoster, Live 10/14/2010    TDAP status: Up to date  Flu Vaccine status: Declined, Education has been provided regarding the importance of this vaccine but patient still declined. Advised may receive this vaccine at local pharmacy or Health Dept. Aware to provide a copy of the vaccination record if obtained from local pharmacy or Health Dept. Verbalized acceptance and  understanding.  Pneumococcal vaccine status: Up to date  Covid-19 vaccine status: Information provided on how to obtain vaccines.   Qualifies for  Shingles Vaccine? No   Screening Tests Health Maintenance  Topic Date Due   DEXA SCAN  01/04/2023   INFLUENZA VACCINE  01/12/2024 (Originally 05/15/2023)   DTaP/Tdap/Td (2 - Td or Tdap) 05/02/2024   Medicare Annual Wellness (AWV)  07/08/2024   MAMMOGRAM  10/31/2024   Colonoscopy  08/05/2026   Pneumonia Vaccine 53+ Years old  Completed   Hepatitis C Screening  Completed   HPV VACCINES  Aged Out   COVID-19 Vaccine  Discontinued   Zoster Vaccines- Shingrix  Discontinued    Health Maintenance  Health Maintenance Due  Topic Date Due   DEXA SCAN  01/04/2023    Colorectal cancer screening: Type of screening: Colonoscopy. Completed 08/05/16. Repeat every 10 years  Mammogram status: Completed 10/31/22. Repeat every year  Bone Density status: Ordered 07/09/23. Pt provided with contact info and advised to call to schedule appt.   Additional Screening:  Hepatitis C Screening:  Completed 11/28/20  Vision Screening: Recommended annual ophthalmology exams for early detection of glaucoma and other disorders of the eye. Is the patient up to date with their annual eye exam?  Yes  Who is the provider or what is the name of the office in which the patient attends annual eye exams? Dr Felizardo Hoffmann If pt is not established with a provider, would they like to be referred to a provider to establish care? No .   Dental Screening: Recommended annual dental exams for proper oral hygiene    Community Resource Referral / Chronic Care Management: CRR required this visit?  No   CCM required this visit?  No     Plan:     I have personally reviewed and noted the following in the patient's chart:   Medical and social history Use of alcohol, tobacco or illicit drugs  Current medications and supplements including opioid prescriptions. Patient is not currently taking opioid prescriptions. Functional ability and status Nutritional status Physical activity Advanced directives List of other  physicians Hospitalizations, surgeries, and ER visits in previous 12 months Vitals Screenings to include cognitive, depression, and falls Referrals and appointments  In addition, I have reviewed and discussed with patient certain preventive protocols, quality metrics, and best practice recommendations. A written personalized care plan for preventive services as well as general preventive health recommendations were provided to patient.     Marzella Schlein, LPN   6/96/2952   After Visit Summary: (MyChart) Due to this being a telephonic visit, the after visit summary with patients personalized plan was offered to patient via MyChart   Nurse Notes: none

## 2023-07-21 ENCOUNTER — Ambulatory Visit (INDEPENDENT_AMBULATORY_CARE_PROVIDER_SITE_OTHER): Payer: Medicare HMO | Admitting: Family Medicine

## 2023-07-21 VITALS — BP 152/82 | HR 85 | Temp 98.3°F | Wt 125.0 lb

## 2023-07-21 DIAGNOSIS — K209 Esophagitis, unspecified without bleeding: Secondary | ICD-10-CM | POA: Diagnosis not present

## 2023-07-21 DIAGNOSIS — J029 Acute pharyngitis, unspecified: Secondary | ICD-10-CM

## 2023-07-21 MED ORDER — FLUCONAZOLE 150 MG PO TABS: ORAL_TABLET | ORAL | 5 refills | Status: DC

## 2023-07-21 MED ORDER — FLUCONAZOLE 150 MG PO TABS: ORAL_TABLET | ORAL | 0 refills | Status: DC

## 2023-07-21 NOTE — Progress Notes (Signed)
Rita Levine , 1951/06/16, 72 y.o., female MRN: [id number] Patient Care Team    Relationship Specialty Notifications Start End  Natalia Leatherwood, DO PCP - General Family Medicine  12/01/19   Corky Crafts, MD PCP - Cardiology Cardiology  01/26/20   Rachael Fee, MD Attending Physician Gastroenterology  12/01/19   Glyn Ade, PA-C Physician Assistant Dermatology  04/18/22   Daneen Schick, OD  Optometry  07/26/22     Chief Complaint  Patient presents with   throat concerns    C/O throat swelling      Subjective: Rita Levine is a 72 y.o. Pt presents for an OV with complaints of throat irritation consistent with recurrent candida esophagitis. She reports she does rinse her mouth out well and drinks water after using wixela inhaler. She has multiple rounds of diflucan since August. Symptoms resolve, but seem to quickly return after completion of diflucan.        07/09/2023    2:12 PM 06/23/2023   11:54 AM 04/29/2023   10:07 AM 10/02/2022    8:21 AM 06/27/2022    1:17 PM  Depression screen PHQ 2/9  Decreased Interest 1 1 0 0 0  Down, Depressed, Hopeless 1 1 0 0 0  PHQ - 2 Score 2 2 0 0 0  Altered sleeping  2     Tired, decreased energy  3     Change in appetite  0     Feeling bad or failure about yourself   2     Trouble concentrating  2     Moving slowly or fidgety/restless  0     Suicidal thoughts 0 0     PHQ-9 Score  11     Difficult doing work/chores Somewhat difficult Very difficult       Allergies  Allergen Reactions   Sulfamethoxazole-Trimethoprim Hives   Avelox [Moxifloxacin Hcl In Nacl]     Racing heart   Cephalexin Hives   Codeine Hives    Heart racing   Cymbalta [Duloxetine Hcl]    Erythromycin Hives   Flagyl [Metronidazole]     Unknown reaction   Paxlovid [Nirmatrelvir-Ritonavir]    Propranolol Hives   Sulfa Antibiotics Hives   Tetanus Toxoid Swelling    Reports a fever and headache with swelling.    Tramadol Hives   Statins Other  (See Comments)    Myalgia    Social History   Social History Narrative   Marital status/children/pets: married, 2 children   Education/employment: HS grad. retired   Field seismologist:      -smoke alarm in the home:Yes     - wears seatbelt: Yes     - Feels safe in their relationships: Yes   Past Medical History:  Diagnosis Date   Allergic rhinitis    Allergic rhinitis due to pollen 12/03/2019   Allergy    Anemia    Asthma    Cataract    Chest pain, unspecified 04/07/2014   Chicken pox    Colon polyp    Diverticulosis    Dysphagia    Dysrhythmia    Palpitations   Fibromyalgia    Food allergy    Gallstone    GERD (gastroesophageal reflux disease)    Hiatal hernia    Hypercholesteremia    Hypertension    Hypothyroidism    IBS (irritable bowel syndrome)    Ingrown toenail 08/23/2020   Migraine headache    occasionally   Gwenevere Abbot  neuroma, 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   Osteoarthritis    Osteopenia    Pneumonia    PONV (postoperative nausea and vomiting)    Pulmonary hypertension (HCC)    pt denies, pt states she was tested for it but was not diagnosed with it   Rectal bleeding    SBO (small bowel obstruction) (HCC) 10/20/2017   Seasonal allergies    Subconjunctival hemorrhage of left eye 08/01/2022   Thyroid nodule    Trigger finger of left and right ring fingers 01/11/2021   Past Surgical History:  Procedure Laterality Date   APPENDECTOMY  1982   BREAST EXCISIONAL BIOPSY Right 1995   benign   BREAST LUMPECTOMY  1991   CARPAL TUNNEL RELEASE Right 2006   Right wrist   CESAREAN SECTION     x 2   CHOLECYSTECTOMY N/A 07/25/2020   Procedure: LAPAROSCOPIC CHOLECYSTECTOMY;  Surgeon: Almond Lint, MD;  Location: [facility] OR;  Service: General;  Laterality: N/A;   COLON RESECTION  1990   COLONOSCOPY     CT ABDOMEN PELVIS W ([facility] HX)  01/03/2020   IMAGE MRI brain:  04/2012   No focal IAC or inner ear lesion to explain hearing loss. Slightly greater than expected number of subcortical T2-  hyperintensities bilaterally.  These are nonspecific.  They can be seen in the setting of chronic migraine headaches, demyelinating process, chronic microvascular ischemic, prior infection or inflammation, or vasculitis   IMAGE MRI lumbar:  05/01/2006   Mild central canal stenosis with facet arthropathy and mildly bulging disc at L4-5.  There is mild narrowing in the left lateral recess at this level.  No definite neural impingement.  Appearance at this level has not markedly Moderately severe facet arthropathy at L5-S1 with mild interval progression of a diffuse broad based disc bulge.  There is mild biforaminal narrowing.  Central canal is open   LAPAROSCOPIC ABDOMINAL EXPLORATION  2019   adhesion removal> caused bowel blockage    NASAL SEPTUM SURGERY  2004   TONSILLECTOMY  1965   TOTAL ABDOMINAL HYSTERECTOMY W/ BILATERAL SALPINGOOPHORECTOMY  1988   TUBAL LIGATION  1974   UPPER GASTROINTESTINAL ENDOSCOPY  2020   Family History  Problem Relation Age of Onset   Hyperlipidemia Father    Hypertension Father    Arthritis Father    Diabetes Father    Asthma Father    COPD Father    Early death Father    Parkinson's disease Father    Hypertension Mother    Hyperlipidemia Mother    Macular degeneration Mother    Arthritis Mother    Hearing loss Mother    Heart disease Mother    Kidney disease Mother    Throat cancer Brother        lung, and tongue   Arthritis Brother    COPD Brother    Diabetes type II Sister    COPD Sister    Heart disease Sister    Hypertension Sister    Thyroid cancer Sister    Lung cancer Sister    Early death Sister    COPD Sister    Breast cancer Maternal Aunt    Lung cancer Other        uncle   Emphysema Maternal Aunt    Emphysema Maternal Uncle    Clotting disorder Sister    Brain cancer Sister        brain tumor- not malignant   Breast cancer Cousin    Cancer Sister    Alcohol abuse  Sister    COPD Sister    Early death Sister    Alcohol abuse  Brother    Arthritis Brother    Depression Brother    Diabetes Brother    Hypercholesterolemia Brother    Colon cancer Neg Hx    Esophageal cancer Neg Hx    Rectal cancer Neg Hx    Stomach cancer Neg Hx    Allergies as of 07/21/2023       Reactions   Sulfamethoxazole-trimethoprim Hives   Avelox [moxifloxacin Hcl In Nacl]    Racing heart   Cephalexin Hives   Codeine Hives   Heart racing   Cymbalta [duloxetine Hcl]    Erythromycin Hives   Flagyl [metronidazole]    Unknown reaction   Paxlovid [nirmatrelvir-ritonavir]    Propranolol Hives   Sulfa Antibiotics Hives   Tetanus Toxoid Swelling   Reports a fever and headache with swelling.    Tramadol Hives   Statins Other (See Comments)   Myalgia        Medication List        Accurate as of July 21, 2023  3:10 PM. If you have any questions, ask your nurse or doctor.          albuterol 108 (90 Base) MCG/ACT inhaler Commonly known as: ProAir HFA Inhale 2 puffs into the lungs every 6 (six) hours as needed.   albuterol (2.5 MG/3ML) 0.083% nebulizer solution Commonly known as: PROVENTIL Take 3 mLs (2.5 mg total) by nebulization every 6 (six) hours as needed.   Armour Thyroid 30 MG tablet Generic drug: thyroid Take 1 tablet by mouth in the AM once a day   Armour Thyroid 15 MG tablet Generic drug: thyroid Take 1 tablet by mouth in the AM Once a day   atenolol 25 MG tablet Commonly known as: TENORMIN Take 0.5-1 tablets (12.5-25 mg total) by mouth daily.   budesonide 0.5 MG/2ML nebulizer solution Commonly known as: PULMICORT TAKE 2 MLS (0.5 MG TOTAL) BY NEBULIZATION IN THE MORNING AND AT BEDTIME.   calcium carbonate 500 MG chewable tablet Commonly known as: TUMS - dosed in mg elemental calcium Chew 3 tablets by mouth daily as needed for indigestion or heartburn.   clobetasol 0.05 % external solution Commonly known as: TEMOVATE Apply 1 Application topically 2 (two) times daily.   clotrimazole 10 MG  troche Commonly known as: MYCELEX Take 1 tablet (10 mg total) by mouth 4 (four) times daily as needed.   CoQ-10 100 MG Caps Take 100 mg by mouth daily.   ezetimibe 10 MG tablet Commonly known as: ZETIA TAKE 1 TABLET BY MOUTH EVERY DAY   famotidine 40 MG tablet Commonly known as: PEPCID TAKE 1 TABLET BY MOUTH TWICE A DAY   fexofenadine 180 MG tablet Commonly known as: Allergy Relief Take 1 tablet (180 mg total) by mouth daily.   fluconazole 200 MG tablet Commonly known as: DIFLUCAN Take 1 tablet (200 mg total) by mouth daily. Initial dose 400mg  po x 1, then 200mg  po every day for 14 days total. What changed: Another medication with the same name was added. Make sure you understand how and when to take each.   fluconazole 150 MG tablet Commonly known as: Diflucan 2 tabs day 1, then 1 tab po for 14 days What changed: You were already taking a medication with the same name, and this prescription was added. Make sure you understand how and when to take each.   fluconazole 150 MG tablet Commonly known  as: DIFLUCAN 1 tab po q 14 days Start taking on: August 18, 2023 What changed: You were already taking a medication with the same name, and this prescription was added. Make sure you understand how and when to take each.   fluticasone 50 MCG/ACT nasal spray Commonly known as: FLONASE Place 2 sprays into both nostrils daily.   folic acid 1 MG tablet Commonly known as: FOLVITE SMARTSIG:2 Tablet(s) By Mouth   hydrochlorothiazide 12.5 MG capsule Commonly known as: MICROZIDE Take 1 capsule (12.5 mg total) by mouth daily as needed.   hyoscyamine 0.125 MG tablet Commonly known as: LEVSIN Take 1 tablet (0.125 mg total) by mouth every 6 (six) hours as needed.   LORazepam 0.5 MG tablet Commonly known as: ATIVAN Take 1 tablet (0.5 mg total) by mouth 2 (two) times daily as needed for anxiety.   mometasone 0.1 % lotion Commonly known as: ELOCON Apply 1 application topically daily  as needed (ear irritation).   montelukast 10 MG tablet Commonly known as: SINGULAIR TAKE 1 TABLET BY MOUTH EVERYDAY AT BEDTIME   ondansetron 8 MG disintegrating tablet Commonly known as: ZOFRAN-ODT TAKE 1 TABLET BY MOUTH EVERY 8 HOURS AS NEEDED FOR NAUSEA OR VOMITING.   pantoprazole 40 MG tablet Commonly known as: PROTONIX Take 1 tablet (40 mg total) by mouth 2 (two) times daily.   potassium chloride SA 20 MEQ tablet Commonly known as: KLOR-CON M Take by mouth.   Probiotic Caps Take 1 capsule by mouth daily.   rosuvastatin 10 MG tablet Commonly known as: CRESTOR TAKE 1 TABLET BY MOUTH THREE TIMES A WEEK.   THERATEARS OP Place 1 drop into both eyes 2 (two) times daily.   tiZANidine 4 MG tablet Commonly known as: ZANAFLEX Take 1 tablet (4 mg total) by mouth every 6 (six) hours as needed for muscle spasms.   vitamin C 1000 MG tablet Take 3,000 mg by mouth daily.   Vitamin D 125 MCG (5000 UT) Caps Take 5,000 Units by mouth daily.   Wixela Inhub 250-50 MCG/ACT Aepb Generic drug: fluticasone-salmeterol INHALE 1 PUFF INTO THE LUNGS IN THE MORNING AND AT BEDTIME.   zinc gluconate 50 MG tablet Take 100 mg by mouth daily.   zolpidem 5 MG tablet Commonly known as: AMBIEN Take 1 tablet (5 mg total) by mouth at bedtime. As needed        All past medical history, surgical history, allergies, family history, immunizations andmedications were updated in the EMR today and reviewed under the history and medication portions of their EMR.     ROS Negative, with the exception of above mentioned in HPI   Objective:  BP (!) 152/82   Pulse 85   Temp 98.3 F (36.8 C)   Wt 125 lb (56.7 kg)   SpO2 97%   BMI 25.25 kg/m  Body mass index is 25.25 kg/m. Physical Exam Vitals and nursing note reviewed.  Constitutional:      General: She is not in acute distress.    Appearance: Normal appearance. She is normal weight. She is not ill-appearing or toxic-appearing.  HENT:      Head: Normocephalic and atraumatic.     Nose: No congestion or rhinorrhea.     Mouth/Throat:     Mouth: Mucous membranes are moist.     Pharynx: No oropharyngeal exudate or posterior oropharyngeal erythema.  Eyes:     General: No scleral icterus.       Right eye: No discharge.  Left eye: No discharge.     Extraocular Movements: Extraocular movements intact.     Conjunctiva/sclera: Conjunctivae normal.     Pupils: Pupils are equal, round, and reactive to light.  Skin:    Findings: No rash.  Neurological:     Mental Status: She is alert and oriented to person, place, and time. Mental status is at baseline.     Motor: No weakness.     Coordination: Coordination normal.     Gait: Gait normal.  Psychiatric:        Mood and Affect: Mood normal.        Behavior: Behavior normal.        Thought Content: Thought content normal.        Judgment: Judgment normal.     No results found. No results found. No results found for this or any previous visit (from the past 24 hour(s)).  Assessment/Plan: Rita Levine is a 72 y.o. female present for OV for  Sore throat/Esophagitis Continue to rinse mouth and drink/gargle water after inhaler use.  Repeat diflucan x14 days and then pulse dose every 2 weeks.  - Ambulatory referral to Gastroenterology- needs establish with new GI doctor, since her GI is out on medical leave. She may need repeat egd to evaluate why she continues to have recurrence of symptoms and ensure it is being treated appropriately.  Reviewed expectations re: course of current medical issues. Discussed self-management of symptoms. Outlined signs and symptoms indicating need for more acute intervention. Patient verbalized understanding and all questions were answered. Patient received an After-Visit Summary.    Orders Placed This Encounter  Procedures   Ambulatory referral to Gastroenterology   Meds ordered this encounter  Medications   fluconazole (DIFLUCAN) 150  MG tablet    Sig: 2 tabs day 1, then 1 tab po for 14 days    Dispense:  15 tablet    Refill:  0   fluconazole (DIFLUCAN) 150 MG tablet    Sig: 1 tab po q 14 days    Dispense:  2 tablet    Refill:  5   Referral Orders         Ambulatory referral to Gastroenterology       Note is dictated utilizing voice recognition software. Although note has been proof read prior to signing, occasional typographical errors still can be missed. If any questions arise, please do not hesitate to call for verification.   electronically signed by:  Felix Pacini, DO   Primary Care - OR

## 2023-07-21 NOTE — Patient Instructions (Signed)
Please call in with home blood pressures within 1 week. Blood pressure should be tested at least 2 hours after medication is taken.

## 2023-07-25 ENCOUNTER — Other Ambulatory Visit (INDEPENDENT_AMBULATORY_CARE_PROVIDER_SITE_OTHER): Payer: Medicare HMO

## 2023-07-25 ENCOUNTER — Ambulatory Visit (INDEPENDENT_AMBULATORY_CARE_PROVIDER_SITE_OTHER): Payer: Medicare HMO | Admitting: Sports Medicine

## 2023-07-25 ENCOUNTER — Encounter: Payer: Self-pay | Admitting: Sports Medicine

## 2023-07-25 DIAGNOSIS — M65311 Trigger thumb, right thumb: Secondary | ICD-10-CM

## 2023-07-25 DIAGNOSIS — M65312 Trigger thumb, left thumb: Secondary | ICD-10-CM

## 2023-07-25 DIAGNOSIS — M65322 Trigger finger, left index finger: Secondary | ICD-10-CM

## 2023-07-25 DIAGNOSIS — M65351 Trigger finger, right little finger: Secondary | ICD-10-CM

## 2023-07-25 DIAGNOSIS — M65321 Trigger finger, right index finger: Secondary | ICD-10-CM | POA: Diagnosis not present

## 2023-07-25 DIAGNOSIS — M65352 Trigger finger, left little finger: Secondary | ICD-10-CM

## 2023-07-25 DIAGNOSIS — M65331 Trigger finger, right middle finger: Secondary | ICD-10-CM

## 2023-07-25 DIAGNOSIS — M65342 Trigger finger, left ring finger: Secondary | ICD-10-CM

## 2023-07-25 DIAGNOSIS — M65332 Trigger finger, left middle finger: Secondary | ICD-10-CM | POA: Diagnosis not present

## 2023-07-25 DIAGNOSIS — M65341 Trigger finger, right ring finger: Secondary | ICD-10-CM | POA: Diagnosis not present

## 2023-07-25 NOTE — Assessment & Plan Note (Signed)
Recurrence of left fourth stenosing tenosynovitis, spent a long time since we did the left side, repeat left fourth flexor tendon sheath injection, continue home conditioning and return to see me as needed.

## 2023-07-25 NOTE — Progress Notes (Signed)
    Procedures performed today:    Procedure: Real-time Ultrasound Guided injection of the left fourth flexor tendon sheath Device: Samsung HS60  Verbal informed consent obtained.  Time-out conducted.  Noted no overlying erythema, induration, or other signs of local infection.  Skin prepped in a sterile fashion.  Local anesthesia: Topical Ethyl chloride.  With sterile technique and under real time ultrasound guidance: Noted flexor tendon nodule, 1/2 cc lidocaine, 1/2 cc kenalog 40 injected easily. Completed without difficulty  Advised to call if fevers/chills, erythema, induration, drainage, or persistent bleeding.  Images permanently stored and available for review in PACS.  Impression: Technically successful ultrasound guided injection.  Independent interpretation of notes and tests performed by another provider:   None.  Brief History, Exam, Impression, and Recommendations:    Trigger finger of all digits of both hands Recurrence of left fourth stenosing tenosynovitis, spent a long time since we did the left side, repeat left fourth flexor tendon sheath injection, continue home conditioning and return to see me as needed.    ____________________________________________ Ihor Austin. Benjamin Stain, M.D., ABFM., CAQSM., AME. Primary Care and Sports Medicine [facility]  Adjunct Professor of Family Medicine  [employer]  Restaurant manager, fast food

## 2023-07-29 ENCOUNTER — Other Ambulatory Visit: Payer: Self-pay | Admitting: Interventional Cardiology

## 2023-08-06 DIAGNOSIS — R070 Pain in throat: Secondary | ICD-10-CM | POA: Diagnosis not present

## 2023-08-06 DIAGNOSIS — J384 Edema of larynx: Secondary | ICD-10-CM | POA: Diagnosis not present

## 2023-08-07 ENCOUNTER — Telehealth: Payer: Self-pay | Admitting: Gastroenterology

## 2023-08-07 NOTE — Telephone Encounter (Signed)
Dr Lavon Paganini this pt is upset that she can not be seen by you until Feb.  You have a chart review spot on 10/29 at 910 am.  Can I add her there for you to see her?  She states she has gastritis, abd pain and had an abnormal CT in May.  Per your note she was to be set up to see Shanda Bumps to discuss EGD.  (That appt was cancelled by pt)    (She has been set up a couple times with apps but due to one reason or another those appts were cancelled per pt. She now only wants to see you)

## 2023-08-07 NOTE — Telephone Encounter (Signed)
chart review spot is not an actual appointment slot, it was created to help with time management to deal with messages from other providers especially PCP's.  Looks like I am pretty overbooked that day but we can overbook her on 11/22 at 11:20 if patient is willing to come in, we have a few 7-day holds prior to that and we can also try to get her in sooner if I have a cancellation prior to the 11/22 appointment. Thank you

## 2023-08-07 NOTE — Telephone Encounter (Signed)
Inbound call from patient, would like to be seen sooner than January. Patient states she believes she has gastritis and would like to discuss with a nurse.

## 2023-08-07 NOTE — Telephone Encounter (Signed)
See phone note dated 10/24

## 2023-08-11 ENCOUNTER — Telehealth: Payer: Self-pay | Admitting: Family Medicine

## 2023-08-11 ENCOUNTER — Ambulatory Visit
Admission: RE | Admit: 2023-08-11 | Discharge: 2023-08-11 | Disposition: A | Discharge status: Home / Self Care | Payer: Medicare HMO | Source: Ambulatory Visit | Attending: Family Medicine | Admitting: Family Medicine

## 2023-08-11 DIAGNOSIS — M8589 Other specified disorders of bone density and structure, multiple sites: Secondary | ICD-10-CM

## 2023-08-11 DIAGNOSIS — E2839 Other primary ovarian failure: Secondary | ICD-10-CM | POA: Diagnosis not present

## 2023-08-11 DIAGNOSIS — M8588 Other specified disorders of bone density and structure, other site: Secondary | ICD-10-CM | POA: Diagnosis not present

## 2023-08-11 DIAGNOSIS — N958 Other specified menopausal and perimenopausal disorders: Secondary | ICD-10-CM | POA: Diagnosis not present

## 2023-08-11 NOTE — Telephone Encounter (Signed)
Please call pt T- score is -1.2 in her hip area and -2.3 in one lx of her spine (L1) Although there has been some bone density decreased since her last bone density scan in 2022, she is still in the osteopenic range.    Osteopenia basic recommendations 1.) Drink alcohol in moderation only 2.) Decrease caffeine consumption to no  More than 2.5 cups/day 3.) Exercise: weight bearing (walking counts), strength and balance training. 4.) No smoking.  5.) Sunlight/Ultraviolet light exposure 30 minutes a day/5 days a week. 6) Adequate calcium 1200 mg daily in the diet.  Her vitamin D has been normal.

## 2023-08-13 NOTE — Telephone Encounter (Signed)
Spoke with patient regarding results/recommendations.  

## 2023-08-14 ENCOUNTER — Other Ambulatory Visit: Payer: Self-pay | Admitting: Urology

## 2023-08-14 DIAGNOSIS — N281 Cyst of kidney, acquired: Secondary | ICD-10-CM

## 2023-08-14 NOTE — Telephone Encounter (Signed)
The pt has been advised of the new appt date and time of 09/02/23 at 1010 am.

## 2023-08-15 ENCOUNTER — Ambulatory Visit: Payer: Medicare HMO | Admitting: Nurse Practitioner

## 2023-09-02 ENCOUNTER — Encounter: Payer: Self-pay | Admitting: Gastroenterology

## 2023-09-02 ENCOUNTER — Ambulatory Visit: Payer: Medicare HMO | Admitting: Gastroenterology

## 2023-09-02 VITALS — BP 110/70 | HR 58 | Ht 59.0 in | Wt 120.0 lb

## 2023-09-02 DIAGNOSIS — K2100 Gastro-esophageal reflux disease with esophagitis, without bleeding: Secondary | ICD-10-CM

## 2023-09-02 DIAGNOSIS — K582 Mixed irritable bowel syndrome: Secondary | ICD-10-CM | POA: Diagnosis not present

## 2023-09-02 DIAGNOSIS — B3781 Candidal esophagitis: Secondary | ICD-10-CM | POA: Diagnosis not present

## 2023-09-02 MED ORDER — FAMOTIDINE 40 MG PO TABS: 40.0000 mg | ORAL_TABLET | Freq: Two times a day (BID) | ORAL | 1 refills | Status: DC

## 2023-09-02 MED ORDER — HYOSCYAMINE SULFATE 0.125 MG PO TABS: 0.1250 mg | ORAL_TABLET | Freq: Four times a day (QID) | ORAL | 5 refills | Status: AC | PRN

## 2023-09-02 MED ORDER — PANTOPRAZOLE SODIUM 40 MG PO TBEC: 40.0000 mg | DELAYED_RELEASE_TABLET | Freq: Two times a day (BID) | ORAL | 3 refills | Status: DC

## 2023-09-02 MED ORDER — FLUCONAZOLE 150 MG PO TABS
150.0000 mg | ORAL_TABLET | Freq: Every day | ORAL | 0 refills | Status: DC
Start: 2023-09-02 — End: 2024-03-25

## 2023-09-02 NOTE — Patient Instructions (Addendum)
VISIT SUMMARY:  Rita Levine, you were seen today for persistent throat and neck pain, intermittent abdominal discomfort, and irregular bowel habits. We discussed your history of chronic esophagitis, diverticulitis, and recent COVID pneumonia, as well as your current symptoms and treatment plan.  YOUR PLAN:  -CHRONIC ESOPHAGITIS: Chronic esophagitis is a long-term inflammation of the esophagus, often causing pain and discomfort. We will start you on Diflucan once daily for a month to treat a potential yeast infection and schedule an upper endoscopy to check for any narrowing of the esophagus and test for H. pylori.  -GASTROESOPHAGEAL REFLUX DISEASE (GERD): GERD is a condition where stomach acid frequently flows back into the esophagus, causing irritation. We will adjust your Protonix to be taken before lunch and Famotidine to be taken in the evening or after dinner. If your symptoms improve, we may consider reducing Protonix to once daily.  -IRRITABLE BOWEL SYNDROME (IBS): IBS is a disorder affecting the large intestine, causing symptoms like cramping, abdominal pain, bloating, gas, and diarrhea or constipation. We will start you on fiber supplementation, beginning with one tablespoon with breakfast and dinner, and adjust as needed. Continue taking Hyoscyamine as needed for cramping and increase your water intake.  -WEIGHT LOSS: You have lost fifteen pounds in the last two months due to stress and decreased appetite, but your weight has now stabilized. We will monitor your weight closely to ensure it remains stable.  -ASTHMA: Asthma is a condition in which your airways narrow and swell, producing extra mucus and making breathing difficult. Continue your current regimen of Advair twice daily and Pulmicort as a rescue inhaler. Remember to rinse your mouth and drink water after using the inhaler to prevent potential exacerbation of esophagitis.  Take Benefiber 1 tablespoon 2-3 times a day with juice or  water  INSTRUCTIONS:  Please follow up after your endoscopy to assess your response to treatment and determine if further interventions are needed.  Due to recent changes in healthcare laws, you may see the results of your imaging and laboratory studies on MyChart before your provider has had a chance to review them.  We understand that in some cases there may be results that are confusing or concerning to you. Not all laboratory results come back in the same time frame and the provider may be waiting for multiple results in order to interpret others.  Please give [place] 48 hours in order for your provider to thoroughly review all the results before contacting the office for clarification of your results.    You have been scheduled for an endoscopy. Please follow written instructions given to you at your visit today.  If you use inhalers (even only as needed), please bring them with you on the day of your procedure.  If you take any of the following medications, they will need to be adjusted prior to your procedure:   DO NOT TAKE 7 DAYS PRIOR TO TEST- Trulicity (dulaglutide) Ozempic, Wegovy (semaglutide) Mounjaro (tirzepatide) Bydureon Bcise (exanatide extended release)  DO NOT TAKE 1 DAY PRIOR TO YOUR TEST Rybelsus (semaglutide) Adlyxin (lixisenatide) Victoza (liraglutide) Byetta (exanatide) ___________________________________________________________________________     I appreciate the  opportunity to care for you  Thank You   Marsa Aris , MD

## 2023-09-02 NOTE — Progress Notes (Signed)
Rita Levine    [id number]    12/07/1950  Primary Care Physician:Kuneff, Ezequiel Essex, DO  Referring Physician: Natalia Leatherwood, DO [address] [place],  [place] [address]   Chief complaint: Dysphagia, sore throat, abdominal pain, diarrhea and constipation  Discussed the use of AI scribe software for clinical note transcription with the patient, who gave verbal consent to proceed.  History of Present Illness   Rita Levine, very pleasant 72 year old  with a history of chronic esophagitis, diverticulitis, and a recent bout of COVID pneumonia, presents with persistent throat and neck pain, as well as intermittent abdominal discomfort. She reports that the abdominal pain has somewhat improved recently.  She has been managing the pain with hyoscyamine, taken as needed, approximately once a week. However, she has been using it less frequently due to misplacing the medication   In 2023-06-20, she contracted COVID pneumonia and subsequently lost 15 pounds over two months due to decreased appetite and persistent diarrhea.  Her husband passed away in 20-Jun-2023 with COVID-pneumonia.  She reports that the weight loss has since stabilized, but bowel habits remain irregular, alternating between constipation and diarrhea. On days with diarrhea, she reports having three to four bowel movements.  Denies any nocturnal symptoms  She has tried over-the-counter stool softeners, but they exacerbated the diarrhea. She also reports a history of yeast infection in the throat, which was treated with Diflucan 3 times this past year but her symptoms seems to have recurred.  She has a history of thyroid disease and takes thyroid medication first thing in the morning. She also takes pantoprazole (Protonix) twice daily for reflux and hyoscyamine as needed for abdominal cramping.  In 2019, she had a bowel obstruction due to adhesions from multiple surgeries, including the removal of 24 inches of the colon due to  diverticulitis. She reports that this area is often the source of her abdominal pain.  She is currently in the process of moving houses, which has added to her stress levels. Despite these health challenges, she describes herself as vivacious and active.     02/05/2022 EGD with white esophageal plaques found consistent with candidiasis and small hiatal hernia. Patient started on Diflucan.  Barium esophagram 2023 suggested motility disorder of esophagus, no strictures or stenosis.   EGD 2020 for dysphagia was normal.  08/05/2016 colonoscopy with diverticulosis and hemorrhoids and otherwise normal.  Repeat recommended 10 years    Outpatient Encounter Medications as of 09/02/2023  Medication Sig   albuterol (PROAIR HFA) 108 (90 Base) MCG/ACT inhaler Inhale 2 puffs into the lungs every 6 (six) hours as needed.   albuterol (PROVENTIL) (2.5 MG/3ML) 0.083% nebulizer solution Take 3 mLs (2.5 mg total) by nebulization every 6 (six) hours as needed.   ARMOUR THYROID 15 MG tablet Take 1 tablet by mouth in the AM Once a day   ARMOUR THYROID 30 MG tablet Take 1 tablet by mouth in the AM once a day   Ascorbic Acid (VITAMIN C) 1000 MG tablet Take 3,000 mg by mouth daily.   atenolol (TENORMIN) 25 MG tablet Take 0.5-1 tablets (12.5-25 mg total) by mouth daily.   budesonide (PULMICORT) 0.5 MG/2ML nebulizer solution TAKE 2 MLS (0.5 MG TOTAL) BY NEBULIZATION IN THE MORNING AND AT BEDTIME.   calcium carbonate (TUMS - DOSED IN MG ELEMENTAL CALCIUM) 500 MG chewable tablet Chew 3 tablets by mouth daily as needed for indigestion or heartburn.   Carboxymethylcellulose Sodium (THERATEARS  OP) Place 1 drop into both eyes 2 (two) times daily.   Cholecalciferol (VITAMIN D) 125 MCG (5000 UT) CAPS Take 5,000 Units by mouth daily.   clobetasol (TEMOVATE) 0.05 % external solution Apply 1 Application topically 2 (two) times daily.   clotrimazole (MYCELEX) 10 MG troche Take 1 tablet (10 mg total) by mouth 4 (four) times daily as  needed.   Coenzyme Q10 (COQ-10) 100 MG CAPS Take 100 mg by mouth daily.   ezetimibe (ZETIA) 10 MG tablet TAKE 1 TABLET BY MOUTH EVERY DAY   fexofenadine (ALLERGY RELIEF) 180 MG tablet Take 1 tablet (180 mg total) by mouth daily.   fluconazole (DIFLUCAN) 150 MG tablet 2 tabs day 1, then 1 tab po for 14 days   fluconazole (DIFLUCAN) 150 MG tablet 1 tab po q 14 days   fluconazole (DIFLUCAN) 150 MG tablet Take 1 tablet (150 mg total) by mouth daily.   fluconazole (DIFLUCAN) 200 MG tablet Take 1 tablet (200 mg total) by mouth daily. Initial dose 400mg  po x 1, then 200mg  po every day for 14 days total.   fluticasone (FLONASE) 50 MCG/ACT nasal spray Place 2 sprays into both nostrils daily.   folic acid (FOLVITE) 1 MG tablet SMARTSIG:2 Tablet(s) By Mouth   hydrochlorothiazide (MICROZIDE) 12.5 MG capsule Take 1 capsule (12.5 mg total) by mouth daily as needed.   LORazepam (ATIVAN) 0.5 MG tablet Take 1 tablet (0.5 mg total) by mouth 2 (two) times daily as needed for anxiety.   mometasone (ELOCON) 0.1 % lotion Apply 1 application topically daily as needed (ear irritation).    montelukast (SINGULAIR) 10 MG tablet TAKE 1 TABLET BY MOUTH EVERYDAY AT BEDTIME   ondansetron (ZOFRAN-ODT) 8 MG disintegrating tablet TAKE 1 TABLET BY MOUTH EVERY 8 HOURS AS NEEDED FOR NAUSEA OR VOMITING.   potassium chloride SA (KLOR-CON M) 20 MEQ tablet Take by mouth.   Probiotic CAPS Take 1 capsule by mouth daily.   rosuvastatin (CRESTOR) 10 MG tablet TAKE 1 TABLET BY MOUTH THREE TIMES A WEEK. Please keep scheduled appointment for future refills. Thank you.   tiZANidine (ZANAFLEX) 4 MG tablet Take 1 tablet (4 mg total) by mouth every 6 (six) hours as needed for muscle spasms.   WIXELA INHUB 250-50 MCG/ACT AEPB INHALE 1 PUFF INTO THE LUNGS IN THE MORNING AND AT BEDTIME.   zinc gluconate 50 MG tablet Take 100 mg by mouth daily.   zolpidem (AMBIEN) 5 MG tablet Take 1 tablet (5 mg total) by mouth at bedtime. As needed   [DISCONTINUED]  famotidine (PEPCID) 40 MG tablet TAKE 1 TABLET BY MOUTH TWICE A DAY   [DISCONTINUED] hyoscyamine (LEVSIN) 0.125 MG tablet Take 1 tablet (0.125 mg total) by mouth every 6 (six) hours as needed.   [DISCONTINUED] pantoprazole (PROTONIX) 40 MG tablet Take 1 tablet (40 mg total) by mouth 2 (two) times daily.   famotidine (PEPCID) 40 MG tablet Take 1 tablet (40 mg total) by mouth 2 (two) times daily.   hyoscyamine (LEVSIN) 0.125 MG tablet Take 1 tablet (0.125 mg total) by mouth every 6 (six) hours as needed.   pantoprazole (PROTONIX) 40 MG tablet Take 1 tablet (40 mg total) by mouth 2 (two) times daily.   No facility-administered encounter medications on file as of 09/02/2023.    Allergies as of 09/02/2023 - Review Complete 09/02/2023  Allergen Reaction Noted   Sulfamethoxazole-trimethoprim Hives 03/29/2011   Avelox [moxifloxacin hcl in nacl]  03/29/2011   Cephalexin Hives 03/29/2011   Codeine Hives  03/29/2011   Cymbalta [duloxetine hcl]  07/23/2022   Erythromycin Hives 03/29/2011   Flagyl [metronidazole]  12/01/2019   Paxlovid [nirmatrelvir-ritonavir]  07/23/2022   Propranolol Hives 04/15/2016   Sulfa antibiotics Hives 03/29/2011   Tetanus toxoid Swelling 05/18/2016   Tramadol Hives 04/05/2014   Statins Other (See Comments) 12/03/2019    Past Medical History:  Diagnosis Date   Allergic rhinitis    Allergic rhinitis due to pollen 12/03/2019   Allergy    Anemia    Asthma    Cataract    Chest pain, unspecified 04/07/2014   Chicken pox    Colon polyp    Diverticulosis    Dysphagia    Dysrhythmia    Palpitations   Fibromyalgia    Food allergy    Gallstone    GERD (gastroesophageal reflux disease)    Hiatal hernia    Hypercholesteremia    Hypertension    Hypothyroidism    IBS (irritable bowel syndrome)    Ingrown toenail 08/23/2020   Migraine headache    occasionally   Morton neuroma, 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   Osteoarthritis    Osteopenia    Pneumonia    PONV (postoperative  nausea and vomiting)    Pulmonary hypertension (HCC)    pt denies, pt states she was tested for it but was not diagnosed with it   Rectal bleeding    SBO (small bowel obstruction) (HCC) 10/20/2017   Seasonal allergies    Subconjunctival hemorrhage of left eye 08/01/2022   Thyroid nodule    Trigger finger of left and right ring fingers 01/11/2021    Past Surgical History:  Procedure Laterality Date   APPENDECTOMY  1982   BREAST EXCISIONAL BIOPSY Right 1995   benign   BREAST LUMPECTOMY  1991   CARPAL TUNNEL RELEASE Right 2006   Right wrist   CESAREAN SECTION     x 2   CHOLECYSTECTOMY N/A 07/25/2020   Procedure: LAPAROSCOPIC CHOLECYSTECTOMY;  Surgeon: Almond Lint, MD;  Location: [facility] OR;  Service: General;  Laterality: N/A;   COLON RESECTION  1990   COLONOSCOPY     CT ABDOMEN PELVIS W ([facility] HX)  01/03/2020   IMAGE MRI brain:  04/2012   No focal IAC or inner ear lesion to explain hearing loss. Slightly greater than expected number of subcortical T2- hyperintensities bilaterally.  These are nonspecific.  They can be seen in the setting of chronic migraine headaches, demyelinating process, chronic microvascular ischemic, prior infection or inflammation, or vasculitis   IMAGE MRI lumbar:  05/01/2006   Mild central canal stenosis with facet arthropathy and mildly bulging disc at L4-5.  There is mild narrowing in the left lateral recess at this level.  No definite neural impingement.  Appearance at this level has not markedly Moderately severe facet arthropathy at L5-S1 with mild interval progression of a diffuse broad based disc bulge.  There is mild biforaminal narrowing.  Central canal is open   LAPAROSCOPIC ABDOMINAL EXPLORATION  2019   adhesion removal> caused bowel blockage    NASAL SEPTUM SURGERY  2004   TONSILLECTOMY  1965   TOTAL ABDOMINAL HYSTERECTOMY W/ BILATERAL SALPINGOOPHORECTOMY  1988   TUBAL LIGATION  1974   UPPER GASTROINTESTINAL ENDOSCOPY  2020    Family History   Problem Relation Age of Onset   Hyperlipidemia Father    Hypertension Father    Arthritis Father    Diabetes Father    Asthma Father    COPD Father    Early death Father  Parkinson's disease Father    Hypertension Mother    Hyperlipidemia Mother    Macular degeneration Mother    Arthritis Mother    Hearing loss Mother    Heart disease Mother    Kidney disease Mother    Throat cancer Brother        lung, and tongue   Arthritis Brother    COPD Brother    Diabetes type II Sister    COPD Sister    Heart disease Sister    Hypertension Sister    Thyroid cancer Sister    Lung cancer Sister    Early death Sister    COPD Sister    Breast cancer Maternal Aunt    Lung cancer Other        uncle   Emphysema Maternal Aunt    Emphysema Maternal Uncle    Clotting disorder Sister    Brain cancer Sister        brain tumor- not malignant   Breast cancer Cousin    Cancer Sister    Alcohol abuse Sister    COPD Sister    Early death Sister    Alcohol abuse Brother    Arthritis Brother    Depression Brother    Diabetes Brother    Hypercholesterolemia Brother    Colon cancer Neg Hx    Esophageal cancer Neg Hx    Rectal cancer Neg Hx    Stomach cancer Neg Hx     Social History   Socioeconomic History   Marital status: Widowed    Spouse name: Celesta Aver   Number of children: 2   Years of education: Not on file   Highest education level: 12th grade  Occupational History   Occupation: homemaker  Tobacco Use   Smoking status: Never    Passive exposure: Past   Smokeless tobacco: Never  Vaping Use   Vaping status: Never Used  Substance and Sexual Activity   Alcohol use: Yes    Comment: very rarely   Drug use: No   Sexual activity: Yes    Partners: Male    Comment: married  Other Topics Concern   Not on file  Social History Narrative   Marital status/children/pets: married, 2 children   Education/employment: HS grad. retired   Field seismologist:      -smoke alarm in the  home:Yes     - wears seatbelt: Yes     - Feels safe in their relationships: Yes   Social Determinants of Health   Financial Resource Strain: Low Risk  (07/09/2023)   Overall Financial Resource Strain (CARDIA)    Difficulty of Paying Living Expenses: Not hard at all  Food Insecurity: No Food Insecurity (07/09/2023)   Hunger Vital Sign    Worried About Running Out of Food in the Last Year: Never true    Ran Out of Food in the Last Year: Never true  Transportation Needs: No Transportation Needs (07/09/2023)   PRAPARE - Administrator, Civil Service (Medical): No    Lack of Transportation (Non-Medical): No  Physical Activity: Sufficiently Active (07/09/2023)   Exercise Vital Sign    Days of Exercise per Week: 5 days    Minutes of Exercise per Session: 30 min  Stress: No Stress Concern Present (07/09/2023)   [employer] of Occupational Health - Occupational Stress Questionnaire    Feeling of Stress : Only a little  Social Connections: Moderately Integrated (07/09/2023)   Social Connection and Isolation Panel [NHANES]  Frequency of Communication with Friends and Family: More than three times a week    Frequency of Social Gatherings with Friends and Family: Once a week    Attends Religious Services: More than 4 times per year    Active Member of [employer] or Organizations: Yes    Attends Banker Meetings: More than 4 times per year    Marital Status: Widowed  Intimate Partner Violence: Not At Risk (07/09/2023)   Humiliation, Afraid, Rape, and Kick questionnaire    Fear of Current or Ex-Partner: No    Emotionally Abused: No    Physically Abused: No    Sexually Abused: No      Review of systems: All other review of systems negative except as mentioned in the HPI.   Physical Exam: Vitals:   09/02/23 1001  BP: 110/70  Pulse: (!) 58   Body mass index is 24.24 kg/m. Gen:      No acute distress HEENT:  sclera anicteric CV: S1-S2 Abd:      soft,  non-tender; no palpable masses, no distension Ext:    No edema Neuro: alert and oriented x 3 Psych: normal mood and affect  Data Reviewed:  Reviewed labs, radiology imaging, old records and pertinent past GI work up  Results   DIAGNOSTIC Colonoscopy: 24 inches of colon removed due to diverticulitis (2017)       Assessment and Plan/Recommendations: 72 year old very pleasant female with history of diverticulitis s/p resection, SBO s/p ex lap and lysis of adhesions in 2019, s/p lap chole 2021 with IBS, generalized abdominal discomfort, alternating constipation and diarrhea, GERD and recurrent Candida esophagitis with odynophagia    Candida esophagitis Recurrent throat and neck pain, with a history of yeast infection in the throat. Recent stress and COVID pneumonia may have exacerbated symptoms. -Start Diflucan once daily for a month to empirically treat Candida esophagitis. -Schedule upper endoscopy to assess for stricture and potential need for esophageal dilation. -Check for H. pylori during endoscopy.  Gastroesophageal Reflux Disease (GERD) Patient on Protonix twice daily and Famotidine midday. -Adjust Protonix to before lunch and Famotidine to evening or after dinner. -Consider reducing Protonix to once daily if symptoms improve. Continue antireflux measures  Irritable Bowel Syndrome (IBS) Alternating constipation and diarrhea, with a history of bowel obstruction due to adhesions from previous surgeries. -Start fiber supplementation, beginning with one tablespoon Benefiber with breakfast and dinner, adjusting as needed. -Continue Hyoscyamine as needed for cramping. -Encouraged increased water intake.  Weight Loss Lost fifteen pounds in the last two months due to stress and decreased appetite. Weight loss has since stabilized. -Monitor weight closely.  Asthma On Advair twice daily, with Pulmicort as a rescue inhaler. -Continue current regimen. -Advise rinsing mouth and  drinking water after inhaler use to prevent potential exacerbation of esophagitis.  Colorectal cancer screening, no history of colon polyps, is up-to-date Due for surveillance colonoscopy in 2027, will consider colonoscopy earlier if has any change in symptoms or has persistent diarrhea for further evaluation  Follow-up after endoscopy to assess response to treatment and need for further interventions.       This visit required 40 minutes of patient care (this includes precharting, chart review, review of results, face-to-face time used for counseling as well as treatment plan and follow-up. The patient was provided an opportunity to ask questions and all were answered. The patient agreed with the plan and demonstrated an understanding of the instructions.  Iona Beard , MD    CC: Felix Pacini  A, DO

## 2023-09-03 ENCOUNTER — Encounter: Payer: Self-pay | Admitting: Gastroenterology

## 2023-09-09 ENCOUNTER — Other Ambulatory Visit: Payer: Self-pay | Admitting: Cardiology

## 2023-09-16 ENCOUNTER — Encounter: Payer: Self-pay | Admitting: Gastroenterology

## 2023-09-17 ENCOUNTER — Other Ambulatory Visit: Payer: Self-pay

## 2023-09-17 ENCOUNTER — Telehealth: Payer: Self-pay

## 2023-09-17 DIAGNOSIS — R1084 Generalized abdominal pain: Secondary | ICD-10-CM

## 2023-09-17 DIAGNOSIS — B3781 Candidal esophagitis: Secondary | ICD-10-CM

## 2023-09-17 DIAGNOSIS — K2100 Gastro-esophageal reflux disease with esophagitis, without bleeding: Secondary | ICD-10-CM

## 2023-09-17 DIAGNOSIS — R634 Abnormal weight loss: Secondary | ICD-10-CM

## 2023-09-17 MED ORDER — NA SULFATE-K SULFATE-MG SULF 17.5-3.13-1.6 GM/177ML PO SOLN: ORAL | 0 refills | Status: DC

## 2023-09-17 NOTE — Telephone Encounter (Signed)
Colonoscopy prep instruction printed and mailed with the prescription for Suprep.  EGD/colonoscopy 10/30/23 at 2:00 pm

## 2023-10-07 ENCOUNTER — Other Ambulatory Visit: Payer: Self-pay | Admitting: Nurse Practitioner

## 2023-10-09 NOTE — Telephone Encounter (Signed)
Rita Levine, did you want to refill her budesonide?   I wanted to make sure bc it was not mentioned on recent AVS, thanks!

## 2023-10-13 ENCOUNTER — Encounter: Payer: Medicare HMO | Admitting: Gastroenterology

## 2023-10-15 DIAGNOSIS — M5489 Other dorsalgia: Secondary | ICD-10-CM | POA: Diagnosis not present

## 2023-10-20 ENCOUNTER — Ambulatory Visit: Payer: Medicare HMO | Admitting: Nurse Practitioner

## 2023-10-22 ENCOUNTER — Ambulatory Visit: Payer: Medicare HMO | Admitting: Family Medicine

## 2023-10-22 ENCOUNTER — Encounter: Payer: Self-pay | Admitting: Family Medicine

## 2023-10-22 VITALS — BP 118/64 | HR 74 | Temp 98.4°F | Wt 117.0 lb

## 2023-10-22 DIAGNOSIS — Z23 Encounter for immunization: Secondary | ICD-10-CM

## 2023-10-22 DIAGNOSIS — Z1231 Encounter for screening mammogram for malignant neoplasm of breast: Secondary | ICD-10-CM

## 2023-10-22 DIAGNOSIS — R7309 Other abnormal glucose: Secondary | ICD-10-CM | POA: Diagnosis not present

## 2023-10-22 DIAGNOSIS — Z0000 Encounter for general adult medical examination without abnormal findings: Secondary | ICD-10-CM | POA: Diagnosis not present

## 2023-10-22 DIAGNOSIS — K219 Gastro-esophageal reflux disease without esophagitis: Secondary | ICD-10-CM | POA: Diagnosis not present

## 2023-10-22 DIAGNOSIS — G479 Sleep disorder, unspecified: Secondary | ICD-10-CM

## 2023-10-22 DIAGNOSIS — R002 Palpitations: Secondary | ICD-10-CM

## 2023-10-22 DIAGNOSIS — M797 Fibromyalgia: Secondary | ICD-10-CM

## 2023-10-22 DIAGNOSIS — E039 Hypothyroidism, unspecified: Secondary | ICD-10-CM

## 2023-10-22 DIAGNOSIS — I10 Essential (primary) hypertension: Secondary | ICD-10-CM | POA: Diagnosis not present

## 2023-10-22 DIAGNOSIS — I700 Atherosclerosis of aorta: Secondary | ICD-10-CM

## 2023-10-22 DIAGNOSIS — M8589 Other specified disorders of bone density and structure, multiple sites: Secondary | ICD-10-CM | POA: Diagnosis not present

## 2023-10-22 DIAGNOSIS — E782 Mixed hyperlipidemia: Secondary | ICD-10-CM | POA: Diagnosis not present

## 2023-10-22 DIAGNOSIS — J302 Other seasonal allergic rhinitis: Secondary | ICD-10-CM | POA: Diagnosis not present

## 2023-10-22 DIAGNOSIS — I2510 Atherosclerotic heart disease of native coronary artery without angina pectoris: Secondary | ICD-10-CM

## 2023-10-22 LAB — COMPREHENSIVE METABOLIC PANEL
ALT: 15 U/L (ref 0–35)
AST: 22 U/L (ref 0–37)
Albumin: 4.2 g/dL (ref 3.5–5.2)
Alkaline Phosphatase: 79 U/L (ref 39–117)
BUN: 16 mg/dL (ref 6–23)
CO2: 31 meq/L (ref 19–32)
Calcium: 9.7 mg/dL (ref 8.4–10.5)
Chloride: 101 meq/L (ref 96–112)
Creatinine, Ser: 0.77 mg/dL (ref 0.40–1.20)
GFR: 77.06 mL/min (ref >60.00)
Glucose, Bld: 105 mg/dL — ABNORMAL HIGH (ref 70–99)
Potassium: 4.7 meq/L (ref 3.5–5.1)
Sodium: 138 meq/L (ref 135–145)
Total Bilirubin: 0.4 mg/dL (ref 0.2–1.2)
Total Protein: 6.4 g/dL (ref 6.0–8.3)

## 2023-10-22 LAB — CBC
HCT: 41.8 % (ref 36.0–46.0)
Hemoglobin: 13.7 g/dL (ref 12.0–15.0)
MCHC: 32.7 g/dL (ref 30.0–36.0)
MCV: 87.1 fL (ref 78.0–100.0)
Platelets: 288 10*3/uL (ref 150.0–400.0)
RBC: 4.8 Mil/uL (ref 3.87–5.11)
RDW: 13.3 % (ref 11.5–15.5)
WBC: 6.1 10*3/uL (ref 4.0–10.5)

## 2023-10-22 LAB — LIPID PANEL
Cholesterol: 162 mg/dL (ref 0–200)
HDL: 51 mg/dL (ref >39.00)
LDL Cholesterol: 84 mg/dL (ref 0–99)
NonHDL: 111.02
Total CHOL/HDL Ratio: 3
Triglycerides: 136 mg/dL (ref 0.0–149.0)
VLDL: 27.2 mg/dL (ref 0.0–40.0)

## 2023-10-22 LAB — HEMOGLOBIN A1C: Hgb A1c MFr Bld: 6.6 % — ABNORMAL HIGH (ref 4.6–6.5)

## 2023-10-22 LAB — TSH: TSH: 1.54 u[IU]/mL (ref 0.35–5.50)

## 2023-10-22 MED ORDER — MONTELUKAST SODIUM 10 MG PO TABS: ORAL_TABLET | ORAL | 1 refills | Status: DC

## 2023-10-22 MED ORDER — FEXOFENADINE HCL 180 MG PO TABS: 180.0000 mg | ORAL_TABLET | Freq: Every day | ORAL | 1 refills | Status: DC

## 2023-10-22 MED ORDER — ZOLPIDEM TARTRATE 5 MG PO TABS: 5.0000 mg | ORAL_TABLET | Freq: Every day | ORAL | 1 refills | Status: DC

## 2023-10-22 MED ORDER — LORAZEPAM 0.5 MG PO TABS: 0.5000 mg | ORAL_TABLET | Freq: Two times a day (BID) | ORAL | 5 refills | Status: DC | PRN

## 2023-10-22 MED ORDER — ATENOLOL 25 MG PO TABS: 12.5000 mg | ORAL_TABLET | Freq: Every day | ORAL | 1 refills | Status: DC

## 2023-10-22 MED ORDER — HYDROCHLOROTHIAZIDE 12.5 MG PO CAPS: 12.5000 mg | ORAL_CAPSULE | Freq: Every day | ORAL | 1 refills | Status: DC | PRN

## 2023-10-22 MED ORDER — FLUTICASONE PROPIONATE 50 MCG/ACT NA SUSP: 2.0000 | Freq: Every day | NASAL | 11 refills | Status: DC

## 2023-10-22 MED ORDER — TIZANIDINE HCL 4 MG PO TABS: 4.0000 mg | ORAL_TABLET | Freq: Four times a day (QID) | ORAL | 5 refills | Status: DC | PRN

## 2023-10-22 NOTE — Patient Instructions (Addendum)
 Return in about 24 weeks (around 04/07/2024) for Routine chronic condition follow-up.        Great to see you today.  I have refilled the medication(s) we provide.   If labs were collected or images ordered, we will inform you of  results once we have received them and reviewed. We will contact you either by echart message, or telephone call.  Please give ample time to the testing facility, and our office to run,  receive and review results. Please do not call inquiring of results, even if you can see them in your chart. We will contact you as soon as we are able. If it has been over 1 week since the test was completed, and you have not yet heard from us , then please call us .    - echart message- for normal results that have been seen by the patient already.   - telephone call: abnormal results or if patient has not viewed results in their echart.  If a referral to a specialist was entered for you, please call us  in 2 weeks if you have not heard from the specialist office to schedule.

## 2023-10-22 NOTE — Progress Notes (Signed)
 Patient ID: NEYTIRI ASCHE, female  DOB: 1951-04-23, 73 y.o.   MRN: [id number] Patient Care Team    Relationship Specialty Notifications Start End  Catherine Charlies LABOR, DO PCP - General Family Medicine  12/01/19   Dann Candyce RAMAN, MD PCP - Cardiology Cardiology  01/26/20   Teressa Toribio SQUIBB, MD (Inactive) Attending Physician Gastroenterology  12/01/19   Porter Andrez JONELLE DEVONNA Physician Assistant Dermatology  04/18/22   Geroge Iha, OD  Optometry  07/26/22     Chief Complaint  Patient presents with   Annual Exam    Chronic Conditions/illness Management  Pt is not fasting     Subjective: LOREEN BANKSON is a 74 y.o.  Female  present for CPE and chronic condition management All past medical history, surgical history, allergies, family history, immunizations, medications and social history were updated in the electronic medical record today. All recent labs, ED visits and hospitalizations within the last year were reviewed.  Health maintenance:  Colonoscopy: completed 07/2016, by jacobs, resutls 10 yr per pt.  Mammogram: completed:10/31/2022 [facility]. Ordered today Cervical cancer screening: > 65 n/a Immunizations: tdap -allergic, Influenza -declined (encouraged yearly), PNA series completed, zostavax/shingrix declined Infectious disease screening:  Hep C completed.  DEXA: last completed 07/2023 (-2.3), osteopenia> rpt 59yr  Gastroesophageal reflux disease, unspecified whether esophagitis present Patient reports symptoms are controlled  on Protonix  and Pepcid .  Established with Dr. Shila   Essential hypertension/HLD/Statin declined/Palpitations/Obesity (BMI 30-39.9)/statin intolerance Pt reports compliance with atenolol , Maxide..   Pt does not daily baby ASA.  Patient is compliant with Zetia  use.   Fibromyalgia/osteoarthritis, unspecified osteoarthritis type, unspecified site/ DDD (degenerative disc disease), lumbar/Spinal stenosis of lumbar region, unspecified whether  neurogenic claudication present Patient reports her fibromyalgia and arthritis symptoms are well-controlled on tylenol , tumeric and Zanaflex .    Seasonal allergies/Allergic rhinitis due to pollen, unspecified seasonality/ Multiple food allergies She reports allergies are well-controlled on Xyzal  nightly, Singulair  nightly and Allegra  as needed in the day. Prior note: Patient reports her allergies have always been rather uncontrolled.  When she lived in a different state she had food allergy testing and was allergic to many foods.  She at one time had allergy shots and this is when her allergies were the best as far as control.  She reports frequent occurrence of sinus infections and sinus headaches.  She had an MRI in 2013.  Her current regimen consist of Xyzal , Allegra , Singulair  and Flonase .  She has taking Zyrtec in the past.  She reports the Xyzal  was added last year but she does not feel its been helpful.    Asthma, intrinsic Patient reports her asthma is well-controlled.  Established with pulmonology   Hypothyroidism, unspecified type/Thyroid  nodule She reports compliance with Armour 45 mg total dose.  Labs are up-to-date.  [facility]  due to cost. Prior note: Thyroid  nodule history.  Last ultrasound 08/17/2018 with left thyroid  nodule.  Per radiology report 1 year follow-up was recommended.  Patient does endorse mild compression-like symptoms.  She states her prior PCP had ordered follow-up, but it was canceled secondary to Covid pandemic.  Patient had thyroid  ultrasound completed at [facility] radiology in [place].  Sleep disturbance: Pt reports ambien  5 mg at bedtime is working well for     07/09/2023    2:12 PM 06/23/2023   11:54 AM 04/29/2023   10:07 AM 10/02/2022    8:21 AM 06/27/2022    1:17 PM  Depression  screen PHQ 2/9  Decreased Interest 1 1 0 0 0  Down, Depressed, Hopeless 1 1 0 0 0  PHQ - 2 Score 2 2 0 0 0  Altered  sleeping  2     Tired, decreased energy  3     Change in appetite  0     Feeling bad or failure about yourself   2     Trouble concentrating  2     Moving slowly or fidgety/restless  0     Suicidal thoughts 0 0     PHQ-9 Score  11     Difficult doing work/chores Somewhat difficult Very difficult         06/23/2023   11:55 AM 04/24/2022    8:51 AM 11/06/2021   11:22 AM  GAD 7 : Generalized Anxiety Score  Nervous, Anxious, on Edge 2 0 0  Control/stop worrying 2 0 0  Worry too much - different things 1 0 0  Trouble relaxing 2 0 0  Restless 0 0 0  Easily annoyed or irritable 0 0 0  Afraid - awful might happen 1 0 0  Total GAD 7 Score 8 0 0  Anxiety Difficulty Somewhat difficult       Immunization History  Administered Date(s) Administered   Influenza-Unspecified 06/11/2019, 07/14/2020   Pneumococcal Conjugate-13 07/03/2016   Pneumococcal Polysaccharide-23 08/03/2019   Tdap 05/02/2014   Zoster, Live 10/14/2010    Past Medical History:  Diagnosis Date   Allergic rhinitis    Allergic rhinitis due to pollen 12/03/2019   Allergy    Anemia    Asthma    Cataract    Chest pain, unspecified 04/07/2014   Chicken pox    Colon polyp    Diverticulosis    Dysphagia    Dysrhythmia    Palpitations   Fibromyalgia    Food allergy    Gallstone    GERD (gastroesophageal reflux disease)    Hiatal hernia    Hypercholesteremia    Hypertension    Hypothyroidism    IBS (irritable bowel syndrome)    Ingrown toenail 08/23/2020   Migraine headache    occasionally   Morton neuroma, 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   Osteoarthritis    Osteopenia    Pneumonia    PONV (postoperative nausea and vomiting)    Pulmonary hypertension (HCC)    pt denies, pt states she was tested for it but was not diagnosed with it   Rectal bleeding    SBO (small bowel obstruction) (HCC) 10/20/2017   Seasonal allergies    Seborrheic dermatitis of scalp 06/02/2020   Subconjunctival hemorrhage of left eye 08/01/2022    Thyroid  nodule    Trigger finger of left and right ring fingers 01/11/2021   Allergies  Allergen Reactions   Sulfamethoxazole-Trimethoprim Hives   Avelox [Moxifloxacin Hcl In Nacl]     Racing heart   Cephalexin Hives   Codeine Hives    Heart racing   Cymbalta  [Duloxetine  Hcl]    Erythromycin Hives   Flagyl [Metronidazole]     Unknown reaction   Paxlovid  [Nirmatrelvir -Ritonavir ]    Propranolol Hives   Sulfa Antibiotics Hives   Tetanus Toxoid Swelling    Reports a fever and headache with swelling.    Tramadol Hives   Statins Other (See Comments)    Myalgia    Past Surgical History:  Procedure Laterality Date   APPENDECTOMY  1982   BREAST EXCISIONAL BIOPSY Right 1995   benign   BREAST LUMPECTOMY  1991  CARPAL TUNNEL RELEASE Right 2006   Right wrist   CESAREAN SECTION     x 2   CHOLECYSTECTOMY N/A 07/25/2020   Procedure: LAPAROSCOPIC CHOLECYSTECTOMY;  Surgeon: Aron Shoulders, MD;  Location: [facility] OR;  Service: General;  Laterality: N/A;   COLON RESECTION  1990   COLONOSCOPY     CT ABDOMEN PELVIS W ([facility] HX)  01/03/2020   IMAGE MRI brain:  04/2012   No focal IAC or inner ear lesion to explain hearing loss. Slightly greater than expected number of subcortical T2- hyperintensities bilaterally.  These are nonspecific.  They can be seen in the setting of chronic migraine headaches, demyelinating process, chronic microvascular ischemic, prior infection or inflammation, or vasculitis   IMAGE MRI lumbar:  05/01/2006   Mild central canal stenosis with facet arthropathy and mildly bulging disc at L4-5.  There is mild narrowing in the left lateral recess at this level.  No definite neural impingement.  Appearance at this level has not markedly Moderately severe facet arthropathy at L5-S1 with mild interval progression of a diffuse broad based disc bulge.  There is mild biforaminal narrowing.  Central canal is open   LAPAROSCOPIC ABDOMINAL EXPLORATION  2019   adhesion removal> caused bowel  blockage    NASAL SEPTUM SURGERY  2004   TONSILLECTOMY  1965   TOTAL ABDOMINAL HYSTERECTOMY W/ BILATERAL SALPINGOOPHORECTOMY  1988   TUBAL LIGATION  1974   UPPER GASTROINTESTINAL ENDOSCOPY  2020   Family History  Problem Relation Age of Onset   Hyperlipidemia Father    Hypertension Father    Arthritis Father    Diabetes Father    Asthma Father    COPD Father    Early death Father    Parkinson's disease Father    Hypertension Mother    Hyperlipidemia Mother    Macular degeneration Mother    Arthritis Mother    Hearing loss Mother    Heart disease Mother    Kidney disease Mother    Throat cancer Brother        lung, and tongue   Arthritis Brother    COPD Brother    Diabetes type II Sister    COPD Sister    Heart disease Sister    Hypertension Sister    Thyroid  cancer Sister    Lung cancer Sister    Early death Sister    COPD Sister    Breast cancer Maternal Aunt    Lung cancer Other        uncle   Emphysema Maternal Aunt    Emphysema Maternal Uncle    Clotting disorder Sister    Brain cancer Sister        brain tumor- not malignant   Breast cancer Cousin    Cancer Sister    Alcohol abuse Sister    COPD Sister    Early death Sister    Alcohol abuse Brother    Arthritis Brother    Depression Brother    Diabetes Brother    Hypercholesterolemia Brother    Colon cancer Neg Hx    Esophageal cancer Neg Hx    Rectal cancer Neg Hx    Stomach cancer Neg Hx    Social History   Social History Narrative   Marital status/children/pets: married, 2 children   Education/employment: HS grad. retired   Field Seismologist:      -smoke alarm in the home:Yes     - wears seatbelt: Yes     - Feels safe in their relationships:  Yes    Allergies as of 10/22/2023       Reactions   Sulfamethoxazole-trimethoprim Hives   Avelox [moxifloxacin Hcl In Nacl]    Racing heart   Cephalexin Hives   Codeine Hives   Heart racing   Cymbalta  [duloxetine  Hcl]    Erythromycin Hives   Flagyl  [metronidazole]    Unknown reaction   Paxlovid  [nirmatrelvir -ritonavir ]    Propranolol Hives   Sulfa Antibiotics Hives   Tetanus Toxoid Swelling   Reports a fever and headache with swelling.    Tramadol Hives   Statins Other (See Comments)   Myalgia        Medication List        Accurate as of October 22, 2023 12:04 PM. If you have any questions, ask your nurse or doctor.          STOP taking these medications    clobetasol  0.05 % external solution Commonly known as: TEMOVATE  Stopped by: Charlies Bellini       TAKE these medications    albuterol  108 (90 Base) MCG/ACT inhaler Commonly known as: ProAir  HFA Inhale 2 puffs into the lungs every 6 (six) hours as needed.   albuterol  (2.5 MG/3ML) 0.083% nebulizer solution Commonly known as: PROVENTIL  Take 3 mLs (2.5 mg total) by nebulization every 6 (six) hours as needed.   Armour Thyroid  30 MG tablet Generic drug: thyroid  Take 1 tablet by mouth in the AM once a day   Armour Thyroid  15 MG tablet Generic drug: thyroid  Take 1 tablet by mouth in the AM Once a day   atenolol  25 MG tablet Commonly known as: TENORMIN  Take 0.5-1 tablets (12.5-25 mg total) by mouth daily.   budesonide  0.5 MG/2ML nebulizer solution Commonly known as: PULMICORT  TAKE 2 MLS (0.5 MG TOTAL) BY NEBULIZATION IN THE MORNING AND AT BEDTIME.   calcium  carbonate 500 MG chewable tablet Commonly known as: TUMS - dosed in mg elemental calcium  Chew 3 tablets by mouth daily as needed for indigestion or heartburn.   clotrimazole  10 MG troche Commonly known as: MYCELEX  Take 1 tablet (10 mg total) by mouth 4 (four) times daily as needed.   CoQ-10 100 MG Caps Take 100 mg by mouth daily.   ezetimibe  10 MG tablet Commonly known as: ZETIA  TAKE 1 TABLET BY MOUTH EVERY DAY   famotidine  40 MG tablet Commonly known as: PEPCID  Take 1 tablet (40 mg total) by mouth 2 (two) times daily.   fexofenadine  180 MG tablet Commonly known as: Allergy Relief Take 1  tablet (180 mg total) by mouth daily.   fluconazole  150 MG tablet Commonly known as: DIFLUCAN  1 tab po q 14 days What changed: Another medication with the same name was removed. Continue taking this medication, and follow the directions you see here. Changed by: Charlies Bellini   fluconazole  150 MG tablet Commonly known as: Diflucan  Take 1 tablet (150 mg total) by mouth daily. What changed: Another medication with the same name was removed. Continue taking this medication, and follow the directions you see here. Changed by: Krystl Wickware   fluticasone  50 MCG/ACT nasal spray Commonly known as: FLONASE  Place 2 sprays into both nostrils daily.   folic acid 1 MG tablet Commonly known as: FOLVITE SMARTSIG:2 Tablet(s) By Mouth   hydrochlorothiazide  12.5 MG capsule Commonly known as: MICROZIDE  Take 1 capsule (12.5 mg total) by mouth daily as needed.   hyoscyamine  0.125 MG tablet Commonly known as: LEVSIN  Take 1 tablet (0.125 mg total) by mouth every 6 (  six) hours as needed.   LORazepam  0.5 MG tablet Commonly known as: ATIVAN  Take 1 tablet (0.5 mg total) by mouth 2 (two) times daily as needed for anxiety.   mometasone 0.1 % lotion Commonly known as: ELOCON Apply 1 application topically daily as needed (ear irritation).   montelukast  10 MG tablet Commonly known as: SINGULAIR  TAKE 1 TABLET BY MOUTH EVERYDAY AT BEDTIME   Na Sulfate-K Sulfate-Mg Sulf 17.5-3.13-1.6 GM/177ML Soln Follow doctor's instructions   ondansetron  8 MG disintegrating tablet Commonly known as: ZOFRAN -ODT TAKE 1 TABLET BY MOUTH EVERY 8 HOURS AS NEEDED FOR NAUSEA OR VOMITING.   pantoprazole  40 MG tablet Commonly known as: PROTONIX  Take 1 tablet (40 mg total) by mouth 2 (two) times daily.   potassium chloride SA 20 MEQ tablet Commonly known as: KLOR-CON M Take by mouth.   Probiotic Caps Take 1 capsule by mouth daily.   rosuvastatin  10 MG tablet Commonly known as: CRESTOR  TAKE 1 TABLET BY MOUTH THREE  TIMES A WEEK. PLEASE KEEP SCHEDULED APPOINTMENT FOR FUTURE REFILLS. THANK YOU.   THERATEARS OP Place 1 drop into both eyes 2 (two) times daily.   tiZANidine  4 MG tablet Commonly known as: ZANAFLEX  Take 1 tablet (4 mg total) by mouth every 6 (six) hours as needed for muscle spasms.   vitamin C 1000 MG tablet Take 3,000 mg by mouth daily.   Vitamin D  125 MCG (5000 UT) Caps Take 5,000 Units by mouth daily.   Wixela Inhub 250-50 MCG/ACT Aepb Generic drug: fluticasone -salmeterol INHALE 1 PUFF INTO THE LUNGS IN THE MORNING AND AT BEDTIME.   zinc gluconate 50 MG tablet Take 100 mg by mouth daily.   zolpidem  5 MG tablet Commonly known as: AMBIEN  Take 1 tablet (5 mg total) by mouth at bedtime. As needed        All past medical history, surgical history, allergies, family history, immunizations andmedications were updated in the EMR today and reviewed under the history and medication portions of their EMR.      ROS: 14 pt review of systems performed and negative (unless mentioned in an HPI)  Objective: BP 118/64   Pulse 74   Temp 98.4 F (36.9 C)   Wt 117 lb (53.1 kg)   SpO2 98%   BMI 23.63 kg/m  Physical Exam Vitals and nursing note reviewed.  Constitutional:      General: She is not in acute distress.    Appearance: Normal appearance. She is not ill-appearing, toxic-appearing or diaphoretic.  HENT:     Head: Normocephalic and atraumatic.     Right Ear: Tympanic membrane, ear canal and external ear normal. There is no impacted cerumen.     Left Ear: Tympanic membrane, ear canal and external ear normal. There is no impacted cerumen.     Nose: No congestion or rhinorrhea.     Mouth/Throat:     Mouth: Mucous membranes are moist.     Pharynx: Oropharynx is clear. No oropharyngeal exudate or posterior oropharyngeal erythema.  Eyes:     General: No scleral icterus.       Right eye: No discharge.        Left eye: No discharge.     Extraocular Movements: Extraocular  movements intact.     Conjunctiva/sclera: Conjunctivae normal.     Pupils: Pupils are equal, round, and reactive to light.  Cardiovascular:     Rate and Rhythm: Normal rate and regular rhythm.     Pulses: Normal pulses.     Heart  sounds: Normal heart sounds. No murmur heard.    No friction rub. No gallop.  Pulmonary:     Effort: Pulmonary effort is normal. No respiratory distress.     Breath sounds: Normal breath sounds. No stridor. No wheezing, rhonchi or rales.  Chest:     Chest wall: No tenderness.  Abdominal:     General: Abdomen is flat. Bowel sounds are normal. There is no distension.     Palpations: Abdomen is soft. There is no mass.     Tenderness: There is no abdominal tenderness. There is no right CVA tenderness, left CVA tenderness, guarding or rebound.     Hernia: No hernia is present.  Musculoskeletal:        General: No swelling, tenderness or deformity. Normal range of motion.     Cervical back: Normal range of motion and neck supple. No rigidity or tenderness.     Right lower leg: No edema.     Left lower leg: No edema.  Lymphadenopathy:     Cervical: No cervical adenopathy.  Skin:    General: Skin is warm and dry.     Coloration: Skin is not jaundiced or pale.     Findings: No bruising, erythema, lesion or rash.  Neurological:     General: No focal deficit present.     Mental Status: She is alert and oriented to person, place, and time. Mental status is at baseline.     Cranial Nerves: No cranial nerve deficit.     Sensory: No sensory deficit.     Motor: No weakness.     Coordination: Coordination normal.     Gait: Gait normal.     Deep Tendon Reflexes: Reflexes normal.  Psychiatric:        Mood and Affect: Mood normal.        Behavior: Behavior normal.        Thought Content: Thought content normal.        Judgment: Judgment normal.     No results found.  Assessment/plan: ANNTONETTE MADEWELL is a 72 y.o. female present for CPE and chronic condition  management Gastroesophageal reflux disease, unspecified whether esophagitis present Managed by GI.  They prescribed her Protonix  and Pepcid    Essential hypertension/HLD/Statin declined/Palpitations/Obesity (BMI 30-39.9)-long-term current use of medication Stable Continue Tenormin  12.5 mg qhs Continue icrozide 12.5 mg daily PRN if notable fluid or  losartan  1/2 tab. Only if pressures are elevated > 135 systolic.  Patient reports compliance with low dose statin twice a week supplied by Dr. Varanassi.   Low-sodium diet.  Routine exercise. Labs: CBC, CMP, lipids collected today  Elevated A1c: A1c collected today Last A1c 6.2  Osteoarthritis, unspecified osteoarthritis type, unspecified site/ DDD (degenerative disc disease), lumbar/Spinal stenosis of lumbar region, unspecified whether neurogenic claudication present/Fibromyalgia Stable Continue Zanaflex    Seasonal allergies/Allergic rhinitis due to pollen, unspecified seasonality/ Multiple food allergies Continue allergy med of choice.  Vistaril   Tired & not helpful.  Continue Singulair  10 mg nightly Continue flonase   Sleep disturbance: Stable Continue Ambien  prn Tired vistaril - not helpful. Could consider trazodone  in the future if willing.  nccs database reviewed today  Asthma, intrinsic Continue management with pulmonology with supplies daily inhaler prescriptions Continue albuterol  as needed Continue antihistamine regimen   Hypothyroidism, unspecified type/Thyroid  nodule Stable Continue Armour Thyroid  total dose 45 mg daily (30/15).  This is the [facility]. TSH collected today - US  THYROID -improvement in size of thyroid  nodule and no further studies needed  in the future.  Osteopenia, unspecified location -Vitamin D  collected today -UTD 2024, T-score -2.3  Routine general medical examination at a health care facility Patient was encouraged to exercise greater than 150 minutes a  week. Patient was encouraged to choose a diet filled with fresh fruits and vegetables, and lean meats. AVS provided to patient today for education/recommendation on gender specific health and safety maintenance. Colonoscopy: completed 07/2016, by jacobs, resutls 10 yr per pt.  Mammogram: completed:10/31/2022 [facility]. Ordered today Immunizations: tdap -allergic, Influenza -declined (encouraged yearly), PNA series completed, zostavax/shingrix declined Infectious disease screening:  Hep C completed.  DEXA: last completed 07/2023 (-2.3), osteopenia> rpt 66yr  Return in about 24 weeks (around 04/07/2024) for Routine chronic condition follow-up.    Orders Placed This Encounter  Procedures   MM 3D SCREENING MAMMOGRAM BILATERAL BREAST   CBC   Comprehensive metabolic panel   Hemoglobin A1c   TSH   Lipid panel    Meds ordered this encounter  Medications   zolpidem  (AMBIEN ) 5 MG tablet    Sig: Take 1 tablet (5 mg total) by mouth at bedtime. As needed    Dispense:  90 tablet    Refill:  1   tiZANidine  (ZANAFLEX ) 4 MG tablet    Sig: Take 1 tablet (4 mg total) by mouth every 6 (six) hours as needed for muscle spasms.    Dispense:  120 tablet    Refill:  5   montelukast  (SINGULAIR ) 10 MG tablet    Sig: TAKE 1 TABLET BY MOUTH EVERYDAY AT BEDTIME    Dispense:  90 tablet    Refill:  1   LORazepam  (ATIVAN ) 0.5 MG tablet    Sig: Take 1 tablet (0.5 mg total) by mouth 2 (two) times daily as needed for anxiety.    Dispense:  30 tablet    Refill:  5   hydrochlorothiazide  (MICROZIDE ) 12.5 MG capsule    Sig: Take 1 capsule (12.5 mg total) by mouth daily as needed.    Dispense:  90 capsule    Refill:  1   fluticasone  (FLONASE ) 50 MCG/ACT nasal spray    Sig: Place 2 sprays into both nostrils daily.    Dispense:  16 mL    Refill:  11   fexofenadine  (ALLERGY RELIEF) 180 MG tablet    Sig: Take 1 tablet (180 mg total) by mouth daily.    Dispense:  90 tablet    Refill:  1   atenolol  (TENORMIN ) 25 MG  tablet    Sig: Take 0.5-1 tablets (12.5-25 mg total) by mouth daily.    Dispense:  90 tablet    Refill:  1    Referral Orders  No referral(s) requested today    Electronically signed by: Charlies Bellini, DO [facility] [place]

## 2023-10-23 ENCOUNTER — Ambulatory Visit: Payer: Medicare HMO | Admitting: Gastroenterology

## 2023-10-23 ENCOUNTER — Other Ambulatory Visit: Payer: Self-pay | Admitting: Family Medicine

## 2023-10-23 DIAGNOSIS — E039 Hypothyroidism, unspecified: Secondary | ICD-10-CM

## 2023-10-23 DIAGNOSIS — N289 Disorder of kidney and ureter, unspecified: Secondary | ICD-10-CM | POA: Diagnosis not present

## 2023-10-23 MED ORDER — ARMOUR THYROID 15 MG PO TABS: ORAL_TABLET | ORAL | 3 refills | Status: DC

## 2023-10-23 MED ORDER — ARMOUR THYROID 30 MG PO TABS: ORAL_TABLET | ORAL | 3 refills | Status: DC

## 2023-10-26 ENCOUNTER — Encounter: Payer: Self-pay | Admitting: Certified Registered Nurse Anesthetist

## 2023-10-30 ENCOUNTER — Encounter: Payer: Medicare HMO | Admitting: Gastroenterology

## 2023-10-30 DIAGNOSIS — M4856XS Collapsed vertebra, not elsewhere classified, lumbar region, sequela of fracture: Secondary | ICD-10-CM | POA: Diagnosis not present

## 2023-10-30 DIAGNOSIS — M546 Pain in thoracic spine: Secondary | ICD-10-CM | POA: Diagnosis not present

## 2023-10-30 DIAGNOSIS — S32000A Wedge compression fracture of unspecified lumbar vertebra, initial encounter for closed fracture: Secondary | ICD-10-CM | POA: Insufficient documentation

## 2023-10-30 DIAGNOSIS — M545 Low back pain, unspecified: Secondary | ICD-10-CM | POA: Diagnosis not present

## 2023-10-31 ENCOUNTER — Ambulatory Visit: Payer: Medicare HMO | Admitting: Cardiology

## 2023-11-19 ENCOUNTER — Telehealth: Payer: Self-pay | Admitting: Gastroenterology

## 2023-11-19 ENCOUNTER — Ambulatory Visit
Admission: RE | Admit: 2023-11-19 | Discharge: 2023-11-19 | Disposition: A | Discharge status: Home / Self Care | Payer: Medicare HMO | Source: Ambulatory Visit | Attending: Family Medicine | Admitting: Family Medicine

## 2023-11-19 DIAGNOSIS — Z1231 Encounter for screening mammogram for malignant neoplasm of breast: Secondary | ICD-10-CM | POA: Diagnosis not present

## 2023-11-19 NOTE — Telephone Encounter (Signed)
 Patient had canceled her previous EGD/colonoscopy. She is scheduled for a follow up that was to take place after her procedures. Offered the patient the only available opening on 11/24/23 at 1:30 pm. Patient accepts the appointment. Declines new instructions. States she can use her previous instructions and she already has her prep. Advised the patient she will be expected to arrive at 12:30 pm on 11/24/23. Patient states understanding of the arrival time.

## 2023-11-19 NOTE — Telephone Encounter (Signed)
 Patient calling to reschedule procedure? Patient is scheduled for ov in next few weeks for a f/u to discuss colon. Please advise.

## 2023-11-20 ENCOUNTER — Other Ambulatory Visit: Payer: Self-pay | Admitting: Cardiology

## 2023-11-22 DIAGNOSIS — M545 Low back pain, unspecified: Secondary | ICD-10-CM | POA: Diagnosis not present

## 2023-11-24 ENCOUNTER — Encounter: Payer: Self-pay | Admitting: Gastroenterology

## 2023-11-24 ENCOUNTER — Encounter: Payer: Self-pay | Admitting: Family Medicine

## 2023-11-24 ENCOUNTER — Ambulatory Visit: Payer: Medicare HMO | Admitting: Gastroenterology

## 2023-11-24 ENCOUNTER — Ambulatory Visit: Payer: Medicare HMO | Admitting: Nurse Practitioner

## 2023-11-24 VITALS — BP 137/51 | HR 71 | Temp 98.4°F | Resp 13 | Ht 59.0 in | Wt 117.0 lb

## 2023-11-24 DIAGNOSIS — M797 Fibromyalgia: Secondary | ICD-10-CM | POA: Diagnosis not present

## 2023-11-24 DIAGNOSIS — E039 Hypothyroidism, unspecified: Secondary | ICD-10-CM | POA: Diagnosis not present

## 2023-11-24 DIAGNOSIS — K648 Other hemorrhoids: Secondary | ICD-10-CM

## 2023-11-24 DIAGNOSIS — D122 Benign neoplasm of ascending colon: Secondary | ICD-10-CM

## 2023-11-24 DIAGNOSIS — K317 Polyp of stomach and duodenum: Secondary | ICD-10-CM | POA: Diagnosis not present

## 2023-11-24 DIAGNOSIS — K449 Diaphragmatic hernia without obstruction or gangrene: Secondary | ICD-10-CM

## 2023-11-24 DIAGNOSIS — D120 Benign neoplasm of cecum: Secondary | ICD-10-CM | POA: Diagnosis not present

## 2023-11-24 DIAGNOSIS — R1013 Epigastric pain: Secondary | ICD-10-CM | POA: Diagnosis not present

## 2023-11-24 DIAGNOSIS — D123 Benign neoplasm of transverse colon: Secondary | ICD-10-CM | POA: Diagnosis not present

## 2023-11-24 DIAGNOSIS — K5730 Diverticulosis of large intestine without perforation or abscess without bleeding: Secondary | ICD-10-CM

## 2023-11-24 DIAGNOSIS — K644 Residual hemorrhoidal skin tags: Secondary | ICD-10-CM | POA: Diagnosis not present

## 2023-11-24 DIAGNOSIS — R194 Change in bowel habit: Secondary | ICD-10-CM | POA: Diagnosis not present

## 2023-11-24 DIAGNOSIS — R634 Abnormal weight loss: Secondary | ICD-10-CM | POA: Diagnosis not present

## 2023-11-24 DIAGNOSIS — I10 Essential (primary) hypertension: Secondary | ICD-10-CM | POA: Diagnosis not present

## 2023-11-24 DIAGNOSIS — K582 Mixed irritable bowel syndrome: Secondary | ICD-10-CM

## 2023-11-24 DIAGNOSIS — K2100 Gastro-esophageal reflux disease with esophagitis, without bleeding: Secondary | ICD-10-CM

## 2023-11-24 DIAGNOSIS — K2950 Unspecified chronic gastritis without bleeding: Secondary | ICD-10-CM

## 2023-11-24 DIAGNOSIS — K3189 Other diseases of stomach and duodenum: Secondary | ICD-10-CM

## 2023-11-24 DIAGNOSIS — K2970 Gastritis, unspecified, without bleeding: Secondary | ICD-10-CM | POA: Diagnosis not present

## 2023-11-24 MED ORDER — SODIUM CHLORIDE 0.9 % IV SOLN: 500.0000 mL | Freq: Once | INTRAVENOUS | Status: DC

## 2023-11-24 NOTE — Progress Notes (Signed)
[facility] History and Physical   Primary Care Physician:  Natalia Leatherwood, DO   Reason for Procedure:  Dysphagia, globus sensation , weight loss, change in bowel habits  Plan:    EGD and colonoscopy with possible interventions as needed     HPI: Rita Levine is a very pleasant 73 y.o. female here for EGD and colonoscopy for dysphagia, globus sensation , weight loss, change in bowel habits.  The risks and benefits as well as alternatives of endoscopic procedure(s) have been discussed and reviewed. All questions answered. The patient agrees to proceed.    Past Medical History:  Diagnosis Date   Allergic rhinitis    Allergic rhinitis due to pollen 12/03/2019   Allergy    Anemia    Asthma    Cataract    Chest pain, unspecified 04/07/2014   Chicken pox    Colon polyp    Diverticulosis    Dysphagia    Dysrhythmia    Palpitations   Fibromyalgia    Food allergy    Gallstone    GERD (gastroesophageal reflux disease)    Hiatal hernia    Hypercholesteremia    Hypertension    Hypothyroidism    IBS (irritable bowel syndrome)    Ingrown toenail 08/23/2020   Migraine headache    occasionally   Morton neuroma, 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   Osteoarthritis    Osteopenia    Pneumonia    PONV (postoperative nausea and vomiting)    Pulmonary hypertension (HCC)    pt denies, pt states she was tested for it but was not diagnosed with it   Rectal bleeding    SBO (small bowel obstruction) (HCC) 10/20/2017   Seasonal allergies    Seborrheic dermatitis of scalp 06/02/2020   Subconjunctival hemorrhage of left eye 08/01/2022   Thyroid nodule    Trigger finger of left and right ring fingers 01/11/2021    Past Surgical History:  Procedure Laterality Date   APPENDECTOMY  1982   BREAST EXCISIONAL BIOPSY Right 1995   benign   BREAST LUMPECTOMY  1991   CARPAL TUNNEL RELEASE Right 2006   Right wrist   CESAREAN SECTION     x 2   CHOLECYSTECTOMY N/A 07/25/2020   Procedure:  LAPAROSCOPIC CHOLECYSTECTOMY;  Surgeon: Almond Lint, MD;  Location: [facility] OR;  Service: General;  Laterality: N/A;   COLON RESECTION  1990   COLONOSCOPY     CT ABDOMEN PELVIS W ([facility] HX)  01/03/2020   IMAGE MRI brain:  04/2012   No focal IAC or inner ear lesion to explain hearing loss. Slightly greater than expected number of subcortical T2- hyperintensities bilaterally.  These are nonspecific.  They can be seen in the setting of chronic migraine headaches, demyelinating process, chronic microvascular ischemic, prior infection or inflammation, or vasculitis   IMAGE MRI lumbar:  05/01/2006   Mild central canal stenosis with facet arthropathy and mildly bulging disc at L4-5.  There is mild narrowing in the left lateral recess at this level.  No definite neural impingement.  Appearance at this level has not markedly Moderately severe facet arthropathy at L5-S1 with mild interval progression of a diffuse broad based disc bulge.  There is mild biforaminal narrowing.  Central canal is open   LAPAROSCOPIC ABDOMINAL EXPLORATION  2019   adhesion removal> caused bowel blockage    NASAL SEPTUM SURGERY  2004   TONSILLECTOMY  1965   TOTAL ABDOMINAL HYSTERECTOMY W/ BILATERAL SALPINGOOPHORECTOMY  1988   TUBAL LIGATION  1974  UPPER GASTROINTESTINAL ENDOSCOPY  2020    Prior to Admission medications   Medication Sig Start Date End Date Taking? Authorizing Provider  ARMOUR THYROID 15 MG tablet Take 1 tablet by mouth in the AM Once a day 10/23/23  Yes Kuneff, Renee A, DO  ARMOUR THYROID 30 MG tablet Take 1 tablet by mouth in the AM once a day 10/23/23  Yes Kuneff, Renee A, DO  Ascorbic Acid (VITAMIN C) 1000 MG tablet Take 3,000 mg by mouth daily.   Yes [provider]  atenolol (TENORMIN) 25 MG tablet Take 0.5-1 tablets (12.5-25 mg total) by mouth daily. 10/22/23  Yes Kuneff, Renee A, DO  calcium carbonate (TUMS - DOSED IN MG ELEMENTAL CALCIUM) 500 MG chewable tablet Chew 3 tablets by mouth daily as needed for  indigestion or heartburn.   Yes [provider]  Carboxymethylcellulose Sodium (THERATEARS OP) Place 1 drop into both eyes 2 (two) times daily.   Yes [provider]  Cholecalciferol (VITAMIN D) 125 MCG (5000 UT) CAPS Take 5,000 Units by mouth daily.   Yes [provider]  Coenzyme Q10 (COQ-10) 100 MG CAPS Take 100 mg by mouth daily.   Yes [provider]  ezetimibe (ZETIA) 10 MG tablet TAKE 1 TABLET BY MOUTH EVERY DAY 06/23/23  Yes Corky Crafts, MD  fexofenadine (ALLERGY RELIEF) 180 MG tablet Take 1 tablet (180 mg total) by mouth daily. 10/22/23  Yes Kuneff, Renee A, DO  fluticasone (FLONASE) 50 MCG/ACT nasal spray Place 2 sprays into both nostrils daily. 10/22/23  Yes Kuneff, Renee A, DO  folic acid (FOLVITE) 1 MG tablet SMARTSIG:2 Tablet(s) By Mouth 04/19/21  Yes [provider]  hydrochlorothiazide (MICROZIDE) 12.5 MG capsule Take 1 capsule (12.5 mg total) by mouth daily as needed. 10/22/23  Yes Kuneff, Renee A, DO  hyoscyamine (LEVSIN) 0.125 MG tablet Take 1 tablet (0.125 mg total) by mouth every 6 (six) hours as needed. 09/02/23  Yes Marypat Kimmet, Eleonore Chiquito, MD  LORazepam (ATIVAN) 0.5 MG tablet Take 1 tablet (0.5 mg total) by mouth 2 (two) times daily as needed for anxiety. 10/22/23  Yes Kuneff, Renee A, DO  montelukast (SINGULAIR) 10 MG tablet TAKE 1 TABLET BY MOUTH EVERYDAY AT BEDTIME 10/22/23  Yes Kuneff, Renee A, DO  ondansetron (ZOFRAN-ODT) 8 MG disintegrating tablet TAKE 1 TABLET BY MOUTH EVERY 8 HOURS AS NEEDED FOR NAUSEA OR VOMITING. 03/28/23  Yes Unk Lightning, PA  pantoprazole (PROTONIX) 40 MG tablet Take 1 tablet (40 mg total) by mouth 2 (two) times daily. 09/02/23  Yes Zaia Carre, Eleonore Chiquito, MD  potassium chloride SA (KLOR-CON M) 20 MEQ tablet Take by mouth. 06/03/23  Yes [provider]  Probiotic CAPS Take 1 capsule by mouth daily.   Yes [provider]  rosuvastatin (CRESTOR) 10 MG tablet TAKE 1 TABLET BY MOUTH THREE TIMES  A WEEK. PLEASE KEEP SCHEDULED APPOINTMENT FOR FUTURE REFILLS. 11/20/23  Yes Turner, Cornelious Bryant, MD  tiZANidine (ZANAFLEX) 4 MG tablet Take 1 tablet (4 mg total) by mouth every 6 (six) hours as needed for muscle spasms. 10/22/23  Yes Kuneff, Renee A, DO  WIXELA INHUB 250-50 MCG/ACT AEPB INHALE 1 PUFF INTO THE LUNGS IN THE MORNING AND AT BEDTIME. 06/20/23  Yes Cobb, Ruby Cola, NP  zinc gluconate 50 MG tablet Take 100 mg by mouth daily.   Yes [provider]  zolpidem (AMBIEN) 5 MG tablet Take 1 tablet (5 mg total) by mouth at bedtime. As needed 10/22/23  Yes Kuneff,  Renee A, DO  albuterol (PROAIR HFA) 108 (90 Base) MCG/ACT inhaler Inhale 2 puffs into the lungs every 6 (six) hours as needed. 10/02/22   Kuneff, Renee A, DO  albuterol (PROVENTIL) (2.5 MG/3ML) 0.083% nebulizer solution Take 3 mLs (2.5 mg total) by nebulization every 6 (six) hours as needed. 10/02/22   Kuneff, Renee A, DO  budesonide (PULMICORT) 0.5 MG/2ML nebulizer solution TAKE 2 MLS (0.5 MG TOTAL) BY NEBULIZATION IN THE MORNING AND AT BEDTIME. 10/09/23   Nyoka Cowden, MD  clotrimazole (MYCELEX) 10 MG troche Take 1 tablet (10 mg total) by mouth 4 (four) times daily as needed. 03/19/22   Nyoka Cowden, MD  famotidine (PEPCID) 40 MG tablet Take 1 tablet (40 mg total) by mouth 2 (two) times daily. 09/02/23   Napoleon Form, MD  fluconazole (DIFLUCAN) 150 MG tablet 1 tab po q 14 days 08/18/23   Kuneff, Renee A, DO  fluconazole (DIFLUCAN) 150 MG tablet Take 1 tablet (150 mg total) by mouth daily. 09/02/23   Napoleon Form, MD  mometasone (ELOCON) 0.1 % lotion Apply 1 application topically daily as needed (ear irritation).  07/09/20   [provider]    Current Outpatient Medications  Medication Sig Dispense Refill   ARMOUR THYROID 15 MG tablet Take 1 tablet by mouth in the AM Once a day 90 tablet 3   ARMOUR THYROID 30 MG tablet Take 1 tablet by mouth in the AM once a day 90 tablet 3   Ascorbic Acid (VITAMIN C) 1000 MG  tablet Take 3,000 mg by mouth daily.     atenolol (TENORMIN) 25 MG tablet Take 0.5-1 tablets (12.5-25 mg total) by mouth daily. 90 tablet 1   calcium carbonate (TUMS - DOSED IN MG ELEMENTAL CALCIUM) 500 MG chewable tablet Chew 3 tablets by mouth daily as needed for indigestion or heartburn.     Carboxymethylcellulose Sodium (THERATEARS OP) Place 1 drop into both eyes 2 (two) times daily.     Cholecalciferol (VITAMIN D) 125 MCG (5000 UT) CAPS Take 5,000 Units by mouth daily.     Coenzyme Q10 (COQ-10) 100 MG CAPS Take 100 mg by mouth daily.     ezetimibe (ZETIA) 10 MG tablet TAKE 1 TABLET BY MOUTH EVERY DAY 90 tablet 3   fexofenadine (ALLERGY RELIEF) 180 MG tablet Take 1 tablet (180 mg total) by mouth daily. 90 tablet 1   fluticasone (FLONASE) 50 MCG/ACT nasal spray Place 2 sprays into both nostrils daily. 16 mL 11   folic acid (FOLVITE) 1 MG tablet SMARTSIG:2 Tablet(s) By Mouth     hydrochlorothiazide (MICROZIDE) 12.5 MG capsule Take 1 capsule (12.5 mg total) by mouth daily as needed. 90 capsule 1   hyoscyamine (LEVSIN) 0.125 MG tablet Take 1 tablet (0.125 mg total) by mouth every 6 (six) hours as needed. 120 tablet 5   LORazepam (ATIVAN) 0.5 MG tablet Take 1 tablet (0.5 mg total) by mouth 2 (two) times daily as needed for anxiety. 30 tablet 5   montelukast (SINGULAIR) 10 MG tablet TAKE 1 TABLET BY MOUTH EVERYDAY AT BEDTIME 90 tablet 1   ondansetron (ZOFRAN-ODT) 8 MG disintegrating tablet TAKE 1 TABLET BY MOUTH EVERY 8 HOURS AS NEEDED FOR NAUSEA OR VOMITING. 30 tablet 1   pantoprazole (PROTONIX) 40 MG tablet Take 1 tablet (40 mg total) by mouth 2 (two) times daily. 180 tablet 3   potassium chloride SA (KLOR-CON M) 20 MEQ tablet Take by mouth.     Probiotic CAPS Take 1  capsule by mouth daily.     rosuvastatin (CRESTOR) 10 MG tablet TAKE 1 TABLET BY MOUTH THREE TIMES A WEEK. PLEASE KEEP SCHEDULED APPOINTMENT FOR FUTURE REFILLS. 36 tablet 0   tiZANidine (ZANAFLEX) 4 MG tablet Take 1 tablet (4 mg  total) by mouth every 6 (six) hours as needed for muscle spasms. 120 tablet 5   WIXELA INHUB 250-50 MCG/ACT AEPB INHALE 1 PUFF INTO THE LUNGS IN THE MORNING AND AT BEDTIME. 60 each 5   zinc gluconate 50 MG tablet Take 100 mg by mouth daily.     zolpidem (AMBIEN) 5 MG tablet Take 1 tablet (5 mg total) by mouth at bedtime. As needed 90 tablet 1   albuterol (PROAIR HFA) 108 (90 Base) MCG/ACT inhaler Inhale 2 puffs into the lungs every 6 (six) hours as needed. 6.7 g 5   albuterol (PROVENTIL) (2.5 MG/3ML) 0.083% nebulizer solution Take 3 mLs (2.5 mg total) by nebulization every 6 (six) hours as needed. 75 mL 0   budesonide (PULMICORT) 0.5 MG/2ML nebulizer solution TAKE 2 MLS (0.5 MG TOTAL) BY NEBULIZATION IN THE MORNING AND AT BEDTIME. 360 mL 2   clotrimazole (MYCELEX) 10 MG troche Take 1 tablet (10 mg total) by mouth 4 (four) times daily as needed. 24 tablet 3   famotidine (PEPCID) 40 MG tablet Take 1 tablet (40 mg total) by mouth 2 (two) times daily. 180 tablet 1   fluconazole (DIFLUCAN) 150 MG tablet 1 tab po q 14 days 2 tablet 5   fluconazole (DIFLUCAN) 150 MG tablet Take 1 tablet (150 mg total) by mouth daily. 28 tablet 0   mometasone (ELOCON) 0.1 % lotion Apply 1 application topically daily as needed (ear irritation).      Current Facility-Administered Medications  Medication Dose Route Frequency Provider Last Rate Last Admin   0.9 %  sodium chloride infusion  500 mL Intravenous Once Napoleon Form, MD        Allergies as of 11/24/2023 - Review Complete 11/24/2023  Allergen Reaction Noted   Paxlovid [nirmatrelvir-ritonavir] Shortness Of Breath and Palpitations 07/23/2022   Tetanus toxoid Swelling 05/18/2016   Avelox [moxifloxacin hcl in nacl] Hives 03/29/2011   Cephalexin Hives 03/29/2011   Codeine Hives 03/29/2011   Cymbalta [duloxetine hcl] Other (See Comments) 07/23/2022   Erythromycin Hives 03/29/2011   Propranolol Hives 04/15/2016   Sulfa antibiotics Hives 03/29/2011    Sulfamethoxazole-trimethoprim Hives 03/29/2011   Tramadol Hives 04/05/2014   Flagyl [metronidazole] Other (See Comments) 12/01/2019   Statins Other (See Comments) 12/03/2019    Family History  Problem Relation Age of Onset   Hyperlipidemia Father    Hypertension Father    Arthritis Father    Diabetes Father    Asthma Father    COPD Father    Early death Father    Parkinson's disease Father    Hypertension Mother    Hyperlipidemia Mother    Macular degeneration Mother    Arthritis Mother    Hearing loss Mother    Heart disease Mother    Kidney disease Mother    Throat cancer Brother        lung, and tongue   Arthritis Brother    COPD Brother    Diabetes type II Sister    COPD Sister    Heart disease Sister    Hypertension Sister    Thyroid cancer Sister    Lung cancer Sister    Early death Sister    COPD Sister    Breast cancer Maternal  Aunt    Lung cancer Other        uncle   Emphysema Maternal Aunt    Emphysema Maternal Uncle    Clotting disorder Sister    Brain cancer Sister        brain tumor- not malignant   Breast cancer Cousin    Cancer Sister    Alcohol abuse Sister    COPD Sister    Early death Sister    Alcohol abuse Brother    Arthritis Brother    Depression Brother    Diabetes Brother    Hypercholesterolemia Brother    Colon cancer Neg Hx    Esophageal cancer Neg Hx    Rectal cancer Neg Hx    Stomach cancer Neg Hx     Social History   Socioeconomic History   Marital status: Widowed    Spouse name: Celesta Aver   Number of children: 2   Years of education: Not on file   Highest education level: 12th grade  Occupational History   Occupation: homemaker  Tobacco Use   Smoking status: Never    Passive exposure: Past   Smokeless tobacco: Never  Vaping Use   Vaping status: Never Used  Substance and Sexual Activity   Alcohol use: Yes    Comment: very rarely   Drug use: No   Sexual activity: Yes    Partners: Male    Comment: married   Other Topics Concern   Not on file  Social History Narrative   Marital status/children/pets: married, 2 children   Education/employment: HS grad. retired   Field seismologist:      -smoke alarm in the home:Yes     - wears seatbelt: Yes     - Feels safe in their relationships: Yes   Social Drivers of Corporate investment banker Strain: Low Risk  (07/09/2023)   Overall Financial Resource Strain (CARDIA)    Difficulty of Paying Living Expenses: Not hard at all  Food Insecurity: No Food Insecurity (07/09/2023)   Hunger Vital Sign    Worried About Running Out of Food in the Last Year: Never true    Ran Out of Food in the Last Year: Never true  Transportation Needs: No Transportation Needs (07/09/2023)   PRAPARE - Administrator, Civil Service (Medical): No    Lack of Transportation (Non-Medical): No  Physical Activity: Sufficiently Active (07/09/2023)   Exercise Vital Sign    Days of Exercise per Week: 5 days    Minutes of Exercise per Session: 30 min  Stress: No Stress Concern Present (07/09/2023)   [employer] of Occupational Health - Occupational Stress Questionnaire    Feeling of Stress : Only a little  Social Connections: Moderately Integrated (07/09/2023)   Social Connection and Isolation Panel [NHANES]    Frequency of Communication with Friends and Family: More than three times a week    Frequency of Social Gatherings with Friends and Family: Once a week    Attends Religious Services: More than 4 times per year    Active Member of [employer] or Organizations: Yes    Attends Banker Meetings: More than 4 times per year    Marital Status: Widowed  Intimate Partner Violence: Not At Risk (07/09/2023)   Humiliation, Afraid, Rape, and Kick questionnaire    Fear of Current or Ex-Partner: No    Emotionally Abused: No    Physically Abused: No    Sexually Abused: No    Review of Systems:  All other review of systems negative except as mentioned in the HPI.  Physical  Exam: Vital signs in last 24 hours: BP (!) 131/58   Pulse 75   Temp 98.4 F (36.9 C)   Ht 4\' 11"  (1.499 m)   Wt 117 lb (53.1 kg)   SpO2 98%   BMI 23.63 kg/m  General:   Alert, NAD Lungs:  Clear .   Heart:  Regular rate and rhythm Abdomen:  Soft, nontender and nondistended. Neuro/Psych:  Alert and cooperative. Normal mood and affect. A and O x 3  Reviewed labs, radiology imaging, old records and pertinent past GI work up  Patient is appropriate for planned procedure(s) and anesthesia in an ambulatory setting   K. Scherry Ran , MD [phone]

## 2023-11-24 NOTE — Op Note (Signed)
 [facility] Patient Name: Rita Levine Procedure Date: 11/24/2023 12:40 PM MRN: [id number] Endoscopist: Sergio Dandy , MD, [id number] Age: 73 Referring MD:  Date of Birth: [birth year] Gender: Female Account #: [id number] Procedure:                Colonoscopy Indications:              Change in bowel habits, Weight loss Medicines:                Monitored Anesthesia Care Procedure:                Pre-Anesthesia Assessment:                           - Prior to the procedure, a History and Physical                            was performed, and patient medications and                            allergies were reviewed. The patient's tolerance of                            previous anesthesia was also reviewed. The risks                            and benefits of the procedure and the sedation                            options and risks were discussed with the patient.                            All questions were answered, and informed consent                            was obtained. Prior Anticoagulants: The patient has                            taken no anticoagulant or antiplatelet agents. ASA                            Grade Assessment: II - A patient with mild systemic                            disease. After reviewing the risks and benefits,                            the patient was deemed in satisfactory condition to                            undergo the procedure.                           After obtaining informed consent, the colonoscope  was passed under direct vision. Throughout the                            procedure, the patient's blood pressure, pulse, and                            oxygen saturations were monitored continuously. The                            [id number]-HQ190L Colonoscope [id number] was introduced                            through the anus and advanced to the the cecum,                            identified by  appendiceal orifice and ileocecal                            valve. The colonoscopy was performed without                            difficulty. The patient tolerated the procedure                            well. The quality of the bowel preparation was                            good. The ileocecal valve, appendiceal orifice, and                            rectum were photographed. Scope In: 1:25:22 PM Scope Out: 1:43:21 PM Scope Withdrawal Time: 0 hours 16 minutes 23 seconds  Total Procedure Duration: 0 hours 17 minutes 59 seconds  Findings:                 The perianal and digital rectal examinations were                            normal.                           Four sessile polyps were found in the transverse                            colon, ascending colon and cecum. The polyps were 3                            to 7 mm in size. These polyps were removed with a                            cold snare. Resection and retrieval were complete.                           Scattered large-mouthed, medium-mouthed and  small-mouthed diverticula were found in the sigmoid                            colon, descending colon, transverse colon and                            ascending colon.                           Non-bleeding external and internal hemorrhoids were                            found during retroflexion. The hemorrhoids were                            medium-sized. Complications:            No immediate complications. Estimated Blood Loss:     Estimated blood loss was minimal. Impression:               - Four 3 to 7 mm polyps in the transverse colon, in                            the ascending colon and in the cecum, removed with                            a cold snare. Resected and retrieved.                           - Severe diverticulosis in the sigmoid colon, in                            the descending colon, in the transverse colon and                             in the ascending colon.                           - Non-bleeding external and internal hemorrhoids. Recommendation:           - Patient has a contact number available for                            emergencies. The signs and symptoms of potential                            delayed complications were discussed with the                            patient. Return to normal activities tomorrow.                            Written discharge instructions were provided to the                            patient.                           -  Resume previous diet.                           - Continue present medications.                           - Await pathology results.                           - Repeat colonoscopy in 3 - 5 years for                            surveillance based on pathology results. Daymond Cordts V. Tabor Denham, MD 11/24/2023 1:52:23 PM This report has been signed electronically.

## 2023-11-24 NOTE — Patient Instructions (Signed)
 Resume previous diet and medications. Awaiting pathology results. Repeat Colonoscopy date to be determined based on pathology results. Handouts provided on Gastritis, Colon polyps, Diverticulosis and Hemorrhoids.   YOU HAD AN ENDOSCOPIC PROCEDURE TODAY AT THE [facility]:   Refer to the procedure report that was given to you for any specific questions about what was found during the examination.  If the procedure report does not answer your questions, please call your gastroenterologist to clarify.  If you requested that your care partner not be given the details of your procedure findings, then the procedure report has been included in a sealed envelope for you to review at your convenience later.  YOU SHOULD EXPECT: Some feelings of bloating in the abdomen. Passage of more gas than usual.  Walking can help get rid of the air that was put into your GI tract during the procedure and reduce the bloating. If you had a lower endoscopy (such as a colonoscopy or flexible sigmoidoscopy) you may notice spotting of blood in your stool or on the toilet paper. If you underwent a bowel prep for your procedure, you may not have a normal bowel movement for a few days.  Please Note:  You might notice some irritation and congestion in your nose or some drainage.  This is from the oxygen used during your procedure.  There is no need for concern and it should clear up in a day or so.  SYMPTOMS TO REPORT IMMEDIATELY:  Following lower endoscopy (colonoscopy or flexible sigmoidoscopy):  Excessive amounts of blood in the stool  Significant tenderness or worsening of abdominal pains  Swelling of the abdomen that is new, acute  Fever of 100F or higher  Following upper endoscopy (EGD)  Vomiting of blood or coffee ground material  New chest pain or pain under the shoulder blades  Painful or persistently difficult swallowing  New shortness of breath  Fever of 100F or higher  Black, tarry-looking  stools  For urgent or emergent issues, a gastroenterologist can be reached at any hour by calling [phone]. Do not use MyChart messaging for urgent concerns.    DIET:  We do recommend a small meal at first, but then you may proceed to your regular diet.  Drink plenty of fluids but you should avoid alcoholic beverages for 24 hours.  ACTIVITY:  You should plan to take it easy for the rest of today and you should NOT DRIVE or use heavy machinery until tomorrow (because of the sedation medicines used during the test).    FOLLOW UP: Our staff will call the number listed on your records the next business day following your procedure.  We will call around 7:15- 8:00 am to check on you and address any questions or concerns that you may have regarding the information given to you following your procedure. If we do not reach you, we will leave a message.     If any biopsies were taken you will be contacted by phone or by letter within the next 1-3 weeks.  Please call us  at [phone] if you have not heard about the biopsies in 3 weeks.    SIGNATURES/CONFIDENTIALITY: You and/or your care partner have signed paperwork which will be entered into your electronic medical record.  These signatures attest to the fact that that the information above on your After Visit Summary has been reviewed and is understood.  Full responsibility of the confidentiality of this discharge information lies with you and/or your care-partner.

## 2023-11-24 NOTE — Progress Notes (Signed)
 Called to room to assist during endoscopic procedure.  Patient ID and intended procedure confirmed with present staff. Received instructions for my participation in the procedure from the performing physician.

## 2023-11-24 NOTE — Progress Notes (Signed)
Vss nad trans to pacu

## 2023-11-24 NOTE — Op Note (Signed)
 [facility] Patient Name: Rita Levine Procedure Date: 11/24/2023 12:40 PM MRN: [id number] Endoscopist: Sergio Dandy , MD, [id number] Age: 73 Referring MD:  Date of Birth: 03/31/1951 Gender: Female Account #: [id number] Procedure:                Upper GI endoscopy Indications:              Dysphagia, Odynophagia, Globus sensation Medicines:                Monitored Anesthesia Care Procedure:                Pre-Anesthesia Assessment:                           - Prior to the procedure, a History and Physical                            was performed, and patient medications and                            allergies were reviewed. The patient's tolerance of                            previous anesthesia was also reviewed. The risks                            and benefits of the procedure and the sedation                            options and risks were discussed with the patient.                            All questions were answered, and informed consent                            was obtained. Prior Anticoagulants: The patient has                            taken no anticoagulant or antiplatelet agents. ASA                            Grade Assessment: II - A patient with mild systemic                            disease. After reviewing the risks and benefits,                            the patient was deemed in satisfactory condition to                            undergo the procedure.                           After obtaining informed consent, the endoscope was  passed under direct vision. Throughout the                            procedure, the patient's blood pressure, pulse, and                            oxygen saturations were monitored continuously. The                            GIF [id number] #[id number] was introduced through the                            mouth, and advanced to the second part of duodenum.                            The upper  GI endoscopy was accomplished without                            difficulty. The patient tolerated the procedure                            well. Scope In: Scope Out: Findings:                 The Z-line was regular and was found 36 cm from the                            incisors.                           The examined esophagus was normal.                           A 2 cm hiatal hernia was present.                           A single 14 mm papule (nodule) with no stigmata of                            recent bleeding was found in the gastric antrum.                            Biopsies were taken with a cold forceps for                            histology.                           Patchy mild inflammation characterized by                            congestion (edema), erosions, erythema and                            friability was found in the entire examined  stomach. Biopsies were taken with a cold forceps                            for Helicobacter pylori testing.                           The cardia and gastric fundus were normal on                            retroflexion.                           The examined duodenum was normal. Complications:            No immediate complications. Estimated Blood Loss:     Estimated blood loss was minimal. Impression:               - Z-line regular, 36 cm from the incisors.                           - Normal esophagus.                           - 2 cm hiatal hernia.                           - A single papule (nodule) found in the stomach.                            Biopsied.                           - Gastritis. Biopsied.                           - Normal examined duodenum. Recommendation:           - Patient has a contact number available for                            emergencies. The signs and symptoms of potential                            delayed complications were discussed with the                             patient. Return to normal activities tomorrow.                            Written discharge instructions were provided to the                            patient.                           - Resume previous diet.                           -  Continue present medications.                           - Await pathology results.                           - Follow an antireflux regimen. Jeena Arnett V. Izael Bessinger, MD 11/24/2023 1:55:24 PM This report has been signed electronically.

## 2023-11-25 ENCOUNTER — Telehealth: Payer: Self-pay

## 2023-11-25 NOTE — Telephone Encounter (Signed)
  Follow up Call-     11/24/2023   12:37 PM 02/05/2022    8:48 AM  Call back number  Post procedure Call Back phone  # [phone]  Permission to leave phone message Yes Yes     Patient questions:  Do you have a fever, pain , or abdominal swelling? No. Pain Score  0 *  Have you tolerated food without any problems? Yes.    Have you been able to return to your normal activities? Yes.    Do you have any questions about your discharge instructions: Diet   No. Medications  No. Follow up visit  No.  Do you have questions or concerns about your Care? No.  Actions: * If pain score is 4 or above: No action needed, pain <4.

## 2023-11-27 ENCOUNTER — Encounter: Payer: Self-pay | Admitting: Nurse Practitioner

## 2023-11-27 ENCOUNTER — Ambulatory Visit: Payer: Medicare HMO | Admitting: Nurse Practitioner

## 2023-11-27 VITALS — BP 126/72 | HR 57 | Ht 59.0 in | Wt 119.0 lb

## 2023-11-27 DIAGNOSIS — R058 Other specified cough: Secondary | ICD-10-CM | POA: Diagnosis not present

## 2023-11-27 DIAGNOSIS — J1282 Pneumonia due to coronavirus disease 2019: Secondary | ICD-10-CM

## 2023-11-27 DIAGNOSIS — J45909 Unspecified asthma, uncomplicated: Secondary | ICD-10-CM

## 2023-11-27 DIAGNOSIS — U071 COVID-19: Secondary | ICD-10-CM

## 2023-11-27 LAB — SURGICAL PATHOLOGY

## 2023-11-27 NOTE — Assessment & Plan Note (Signed)
Stable. Continue current allergic rhinitis/GERD regimen.

## 2023-11-27 NOTE — Assessment & Plan Note (Signed)
Stable on current regimen. She has an upper airway component to her symptoms as well. She had COVID pna 05/2023 but appears to have recovered well without residual symptoms. Continue current regimen. Action plan in place.  Patient Instructions  -Continue Advair 1 puffs Twice daily. Brush tongue and rinse mouth afterwards. Use with spacer.  -Continue albuterol inhaler 2 puffs or 3 mL nebulizer every 6 hours as needed for shortness of breath or cough -Continue sinuglair 10 mg At bedtime -Continue protonix 40 mg Twice daily -Continue pepcid 10 mg daily  -Continue Xyzal 5 mg At bedtime  -Continue nasal irrigation treatments per ENT -Continue flonase 2 sprays each nostril Twice daily for nasal congestion/drainage  -Continue Chlortab 4 mg over the counter At bedtime for cough -Continue mucinex over the counter   Attend CT chest once scheduled. I'll call you with results     Follow up in 6 months with Dr. Sherene Sires or Philis Nettle. If symptoms worsen, please contact office for sooner follow up or seek emergency care.

## 2023-11-27 NOTE — Assessment & Plan Note (Addendum)
August 2024. Clinically improved. Will plan repeat CT chest for follow up to ensure infiltrates resolved and no evidence of post COVID ILD.

## 2023-11-27 NOTE — Patient Instructions (Addendum)
-  Continue Advair 1 puffs Twice daily. Brush tongue and rinse mouth afterwards. Use with spacer.  -Continue albuterol inhaler 2 puffs or 3 mL nebulizer every 6 hours as needed for shortness of breath or cough -Continue sinuglair 10 mg At bedtime -Continue protonix 40 mg Twice daily -Continue pepcid 10 mg daily  -Continue Xyzal 5 mg At bedtime  -Continue nasal irrigation treatments per ENT -Continue flonase 2 sprays each nostril Twice daily for nasal congestion/drainage  -Continue Chlortab 4 mg over the counter At bedtime for cough -Continue mucinex over the counter   Attend CT chest once scheduled. I'll call you with results     Follow up in 6 months with Dr. Sherene Sires or Philis Nettle. If symptoms worsen, please contact office for sooner follow up or seek emergency care.

## 2023-11-27 NOTE — Progress Notes (Signed)
@Patient  ID: Rita Levine, female    DOB: 04/10/1951, 73 y.o.   MRN: [id number]  Chief Complaint  Patient presents with   Follow-up    Pt has sob w/ coughing due to allergies during exertion, pt doesn't have any other concerns     Referring provider: Natalia Leatherwood, DO  HPI: 73 year old female, never smoker followed for hx of asthma, chronic cough.  She is a patient of Dr. Thurston Hole and was last seen in office on 04/14/2023 by [facility] NP.  Past medical history significant for hypertension, aortic atherosclerosis, CAD, pulmonary hypertension, GERD, IBS, hypothyroidism, HLD, fibromyalgia, allergic rhinitis  TEST/EVENTS:  01/15/2021 CT chest with contrast: moderate aortic atherosclerosis. Occasional coronary artery calcifications. Tiny hiatal hernia. Linear atelectasis/scar in the lingula, similar to prior exam. 3 mm pleural based nodule in the right upper lobe, likely benign. 04/28/2021 CXR 1 view: Lung volumes normal.  Pulmonary vasculature and the cardiomediastinal silhouette are within normal limits. 10/25/2021: Allergen panel negative, eos 100 05/30/2022 PFT: FVC 93, FEV1 115, ratio 94, TLC 88, DLCOcor 117. No BD 06/03/2022 CT chest w con: subcm nodes in mediastinum with no significant change. 3 mm pleural based nodule in RUL with no significant change. No acute process. Fatty infiltration of the liver. Atherosclerosis.  06/03/2023 CTA chest outside facility: no PE. B/l lung infiltrate; possible COVID pna.   08/02/2021: OV with Dr. Sherene Sires.  Initial consult for chronic cough since having pneumonia in 2019.  No report of dyspnea.  Was previously placed on Symbicort 80 and was also using [place] twice a day.  Cough worse with Symbicort -suspected upper airway cough syndrome.  Recommended stopping Symbicort on trial basis.  Aggressive treatment of GERD with Protonix 40 mg twice a day and Pepcid 20 mg in the evening.  Albuterol and budesonide nebs up to twice a day if needed.  6-day prednisone  taper.  09/12/2021: OV with Canda Podgorski NP for 6-week follow-up.  Did not notice major change in her symptoms.  She did experience some shortness of breath and chest tightness after burning some trash in her yard and working in the attic earlier in the week.  Felt some improvement but had not returned to baseline.  Persistent cough with primarily clear sputum production.  Allergy symptoms uncontrolled and currently being treated by ENT.  Felt GERD symptoms improved with twice daily PPI.  Increased ICS budesonide nebs to 0.5 mg.  Prednisone taper.  10/25/2021: OV with Colt Martelle NP for 6 week follow up. She continues to experience persistent cough with clear sputum production. She occasionally experiences minimal shortness of breath upon exertion and with coughing spells. She continues to have post nasal drip and difficulties with her chronic sinusitis which she is scheduled to follow up with ENT this week to discuss. She also notes occasional breakthrough reflux, despite Twice daily PPI and pepcid nightly. She elevates the head of her bed and avoids triggering foods. She did notice some improvement in her symptoms with the prednisone taper and reports that her albuterol rescue inhaler does relieve her shortness of breath. FeNO nl. Persistent productive cough and minimal SOB with exertion. Suspect uncontrolled chronic sinusitis and GERD contributing. Hx of asthma - spirometry 2012 normal; no reports of wheezing. Slight improvement with prednisone. Trial triple therapy inhaler with Breztri 2 puffs Twice daily. Stop nebs. PRN albuterol. Instructed to follow up with ENT as scheduled. Advised to follow up with GI to discuss breakthrough GERD as she is already on Twice daily PPI and H2 once  daily - may need EGD for further eval. Continue Singulair, Xyzal, saline nasal rinses. PFTs ordered today. Allergen panel and CBC with diff.   05/08/2022: OV with Caia Lofaro NP for overdue follow-up.  She is still having a persistent chronic cough,  which is possibly slightly worse than it was previously.  She has not noticed a huge change..  Usually it is nonproductive but occasionally she will produce clear to yellow sputum, which is usually in the morning.  Breathing is unchanged ; will get short of breath with strenuous activity or climbing stairs.  She also has persistent hoarseness, which she feels like is worse than it has been in the past.  She has chronic sinusitis and uses multiple nasal rinses.  She was seen by ENT after her last visit but only discussed a spot on her tongue and did not discuss her hoarseness or her persistent chronic sinusitis.  Feels like her GERD symptoms are better controlled.  She denies any wheezing, increased shortness of breath, orthopnea, PND, hemoptysis, weight loss, anorexia, fatigue, dysphagia.  She was previously trialed on step up to [place].  Did not notice a huge difference when back to low-dose Symbicort.  Uses this with a spacer.  Never completed her PFTs because she did not want to get COVID tested again.  Her PCP has ordered a CT chest for evaluation of the previously identified small nodule. CXR with bronchitic changes - treated with doxy and prednisone.  05/30/2022: OV with Dineen Conradt NP for follow up after undergoing pulmonary function testing, which was overall normal. She reports she has been doing well since I saw her last. Still has an occasional dry cough but it is stable. Her breathing is unchanged. Unsure if the last course of prednisone and antibiotics did much for her. She denies any recent wheezing, chest congestion, fevers, night seats, hemoptysis, anorexia, weight loss. She is on Symbicort Twice daily. Rarely uses albuterol. She still has some issues with postnasal drainage and nasal congestion. Due to see ENT next week. She uses flonase, xyzal, and singulair. Does nasal irrigations daily. Denies any headaches or purulent drainage. She has CT chest ordered by her PCP for nodule follow up next week.    12/02/2022: Ov with Kendre Jacinto NP for intended follow up; however, she is also having some acute symptoms which started around a month ago. She has had a lot of sinus congestion with green mucus. She's been coughing more frequently, mostly dry during the day but productive in the AM with similar colored sputum. She feels like she's had more chest tightness, wheezing, and has been getting a little more winded. She denies fevers, chills, hemoptysis, leg swelling, facial tenderness She's using her rescue inhaler multiple times a week, which does help. She is still using Symbicort twice daily, singulai, Xyzal, and nasal irrigation/sprays as directed by ENT.  04/14/2023: Ov with Mirza Kidney NP for follow up. She has been doing well since she was here last for the most part. The heat gives her a little more trouble but that's typical for this time of year. Her cough is at her baseline, primarily dry. She has some occasional voice hoarseness, which has not changed. No wheezing or chest congestion.   11/27/2023: Today - follow up Patient presents today for overdue follow-up.  Unfortunately, since she was here last her husband passed away.  They both had COVID in August 2024 and that he ended up becoming very sick following this.  He did have a history of lung disease related  to agent orange exposure.  He passed around a month later.  She also had COVID pneumonia at the time but fortunately never had to be hospitalized.  Feels like she has recovered well and is back to her baseline.  Not having any issues with her cough.  Minimal with clear phlegm.  Does sometimes increase with postnasal drainage.  Breathing feels like it is at her baseline.  Not noticing any increase shortness of breath.  No wheezing or chest congestion.  No fevers, chills, hemoptysis.  Never had follow-up imaging following COVID illness.  Not having to use her rescue inhaler.  Using Wixela twice a day.  Continues on her allergy and GERD regimen, which feel  well-controlled.  Allergies  Allergen Reactions   Paxlovid [Nirmatrelvir-Ritonavir] Shortness Of Breath and Palpitations   Tetanus Toxoid Swelling    Reports a fever and headache with swelling.    Avelox [Moxifloxacin Hcl In Nacl] Hives    Racing heart   Cephalexin Hives   Codeine Hives    Heart racing   Cymbalta [Duloxetine Hcl] Other (See Comments)    Increased tremors   Erythromycin Hives   Propranolol Hives   Sulfa Antibiotics Hives   Sulfamethoxazole-Trimethoprim Hives   Tramadol Hives   Flagyl [Metronidazole] Other (See Comments)    Unknown reaction, unknown severity   Statins Other (See Comments)    Myalgia     Immunization History  Administered Date(s) Administered   Influenza-Unspecified 06/11/2019, 07/14/2020   Pneumococcal Conjugate-13 07/03/2016   Pneumococcal Polysaccharide-23 08/03/2019   Tdap 05/02/2014   Zoster, Live 10/14/2010    Past Medical History:  Diagnosis Date   Allergic rhinitis    Allergic rhinitis due to pollen 12/03/2019   Allergy    Anemia    Asthma    Cataract    Chest pain, unspecified 04/07/2014   Chicken pox    Colon polyp    Diverticulosis    Dysphagia    Dysrhythmia    Palpitations   Fibromyalgia    Food allergy    Gallstone    GERD (gastroesophageal reflux disease)    Hiatal hernia    Hypercholesteremia    Hypertension    Hypothyroidism    IBS (irritable bowel syndrome)    Ingrown toenail 08/23/2020   Migraine headache    occasionally   Morton neuroma, 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   Osteoarthritis    Osteopenia    Pneumonia    PONV (postoperative nausea and vomiting)    Pulmonary hypertension (HCC)    pt denies, pt states she was tested for it but was not diagnosed with it   Rectal bleeding    SBO (small bowel obstruction) (HCC) 10/20/2017   Seasonal allergies    Seborrheic dermatitis of scalp 06/02/2020   Subconjunctival hemorrhage of left eye 08/01/2022   Thyroid nodule    Trigger finger of left and right ring  fingers 01/11/2021    Tobacco History: Social History   Tobacco Use  Smoking Status Never   Passive exposure: Past  Smokeless Tobacco Never   Counseling given: Not Answered   Outpatient Medications Prior to Visit  Medication Sig Dispense Refill   albuterol (PROAIR HFA) 108 (90 Base) MCG/ACT inhaler Inhale 2 puffs into the lungs every 6 (six) hours as needed. 6.7 g 5   albuterol (PROVENTIL) (2.5 MG/3ML) 0.083% nebulizer solution Take 3 mLs (2.5 mg total) by nebulization every 6 (six) hours as needed. 75 mL 0   ARMOUR THYROID 15 MG tablet Take 1 tablet by mouth  in the AM Once a day 90 tablet 3   ARMOUR THYROID 30 MG tablet Take 1 tablet by mouth in the AM once a day 90 tablet 3   Ascorbic Acid (VITAMIN C) 1000 MG tablet Take 3,000 mg by mouth daily.     atenolol (TENORMIN) 25 MG tablet Take 0.5-1 tablets (12.5-25 mg total) by mouth daily. 90 tablet 1   budesonide (PULMICORT) 0.5 MG/2ML nebulizer solution TAKE 2 MLS (0.5 MG TOTAL) BY NEBULIZATION IN THE MORNING AND AT BEDTIME. 360 mL 2   calcium carbonate (TUMS - DOSED IN MG ELEMENTAL CALCIUM) 500 MG chewable tablet Chew 3 tablets by mouth daily as needed for indigestion or heartburn.     Carboxymethylcellulose Sodium (THERATEARS OP) Place 1 drop into both eyes 2 (two) times daily.     Cholecalciferol (VITAMIN D) 125 MCG (5000 UT) CAPS Take 5,000 Units by mouth daily.     clotrimazole (MYCELEX) 10 MG troche Take 1 tablet (10 mg total) by mouth 4 (four) times daily as needed. 24 tablet 3   Coenzyme Q10 (COQ-10) 100 MG CAPS Take 100 mg by mouth daily.     ezetimibe (ZETIA) 10 MG tablet TAKE 1 TABLET BY MOUTH EVERY DAY 90 tablet 3   fexofenadine (ALLERGY RELIEF) 180 MG tablet Take 1 tablet (180 mg total) by mouth daily. 90 tablet 1   fluconazole (DIFLUCAN) 150 MG tablet 1 tab po q 14 days 2 tablet 5   fluconazole (DIFLUCAN) 150 MG tablet Take 1 tablet (150 mg total) by mouth daily. 28 tablet 0   fluticasone (FLONASE) 50 MCG/ACT nasal spray  Place 2 sprays into both nostrils daily. 16 mL 11   folic acid (FOLVITE) 1 MG tablet SMARTSIG:2 Tablet(s) By Mouth     hydrochlorothiazide (MICROZIDE) 12.5 MG capsule Take 1 capsule (12.5 mg total) by mouth daily as needed. 90 capsule 1   hyoscyamine (LEVSIN) 0.125 MG tablet Take 1 tablet (0.125 mg total) by mouth every 6 (six) hours as needed. 120 tablet 5   LORazepam (ATIVAN) 0.5 MG tablet Take 1 tablet (0.5 mg total) by mouth 2 (two) times daily as needed for anxiety. 30 tablet 5   mometasone (ELOCON) 0.1 % lotion Apply 1 application topically daily as needed (ear irritation).      montelukast (SINGULAIR) 10 MG tablet TAKE 1 TABLET BY MOUTH EVERYDAY AT BEDTIME 90 tablet 1   ondansetron (ZOFRAN-ODT) 8 MG disintegrating tablet TAKE 1 TABLET BY MOUTH EVERY 8 HOURS AS NEEDED FOR NAUSEA OR VOMITING. 30 tablet 1   pantoprazole (PROTONIX) 40 MG tablet Take 1 tablet (40 mg total) by mouth 2 (two) times daily. 180 tablet 3   potassium chloride SA (KLOR-CON M) 20 MEQ tablet Take by mouth.     Probiotic CAPS Take 1 capsule by mouth daily.     rosuvastatin (CRESTOR) 10 MG tablet TAKE 1 TABLET BY MOUTH THREE TIMES A WEEK. PLEASE KEEP SCHEDULED APPOINTMENT FOR FUTURE REFILLS. 36 tablet 0   tiZANidine (ZANAFLEX) 4 MG tablet Take 1 tablet (4 mg total) by mouth every 6 (six) hours as needed for muscle spasms. 120 tablet 5   WIXELA INHUB 250-50 MCG/ACT AEPB INHALE 1 PUFF INTO THE LUNGS IN THE MORNING AND AT BEDTIME. 60 each 5   zinc gluconate 50 MG tablet Take 100 mg by mouth daily.     zolpidem (AMBIEN) 5 MG tablet Take 1 tablet (5 mg total) by mouth at bedtime. As needed 90 tablet 1   famotidine (PEPCID) 40  MG tablet Take 1 tablet (40 mg total) by mouth 2 (two) times daily. 180 tablet 1   No facility-administered medications prior to visit.     Review of Systems:   Constitutional: No weight loss or gain, night sweats, fevers, chills, fatigue, or lassitude. HEENT: No headaches, difficulty swallowing,  tooth/dental problems, or sore throat. No sneezing, itching, ear ache +baseline nasal congestion/drainage, occasional hoarseness (baseline) CV:  No chest pain, orthopnea, PND, swelling in lower extremities, anasarca, dizziness, palpitations, syncope Resp: +shortness of breath with exertion (baseline); chronic cough. No wheeze. No hemoptysis.  No chest wall deformity GI:  No heartburn, indigestion, abdominal pain, nausea, vomiting, diarrhea, change in bowel habits, loss of appetite, bloody stools.  GU: No dysuria, change in color of urine, urgency or frequency.   Skin: No rash, lesions, ulcerations MSK:  No joint pain or swelling.   Neuro: No dizziness or lightheadedness.  Psych: No depression or anxiety. Mood stable. +grief     Physical Exam:  BP 126/72 (BP Location: Left Arm, Patient Position: Sitting, Cuff Size: Normal)   Pulse (!) 57   Ht 4\' 11"  (1.499 m)   Wt 119 lb (54 kg)   SpO2 100%   BMI 24.04 kg/m   GEN: Pleasant, interactive, well-nourished; in no acute distress. HEENT:  Normocephalic and atraumatic. EACs patent bilaterally. TM pearly gray with present light reflex bilaterally. PERRLA. Sclera white. Nasal turbinates pink, moist and patent bilaterally.  Right nasal polyp (chronic).  No rhinorrhea present. Oropharynx pink and moist, without exudate or edema. No lesions, ulcerations NECK:  Supple w/ fair ROM. No JVD present. Normal carotid impulses w/o bruits. Thyroid symmetrical with no goiter or nodules palpated. No lymphadenopathy.   CV: RRR, no m/r/g, no peripheral edema. Pulses intact, +2 bilaterally. No cyanosis, pallor or clubbing. PULMONARY:  Unlabored, regular breathing. Clear bilaterally A&P w/o wheezes/rales/rhonchi. No accessory muscle use. No dullness to percussion. GI: BS present and normoactive. Soft, non-tender to palpation. No organomegaly or masses detected.  MSK: No erythema, warmth or tenderness.  Neuro: A/Ox3. No focal deficits noted.   Skin: Warm, no lesions  or rashe Psych: Normal affect and behavior. Judgement and thought content appropriate.     Lab Results:  CBC    Component Value Date/Time   WBC 6.1 10/22/2023 0953   RBC 4.80 10/22/2023 0953   HGB 13.7 10/22/2023 0953   HCT 41.8 10/22/2023 0953   PLT 288.0 10/22/2023 0953   MCV 87.1 10/22/2023 0953   MCH 28.3 04/28/2021 0934   MCHC 32.7 10/22/2023 0953   RDW 13.3 10/22/2023 0953   LYMPHSABS 1.6 04/29/2023 1020   MONOABS 0.7 04/29/2023 1020   EOSABS 0.1 04/29/2023 1020   BASOSABS 0.1 04/29/2023 1020    BMET    Component Value Date/Time   NA 138 10/22/2023 0953   NA 138 01/04/2021 1001   K 4.7 10/22/2023 0953   CL 101 10/22/2023 0953   CO2 31 10/22/2023 0953   GLUCOSE 105 (H) 10/22/2023 0953   BUN 16 10/22/2023 0953   BUN 12 01/04/2021 1001   CREATININE 0.77 10/22/2023 0953   CREATININE 0.71 07/26/2022 1641   CALCIUM 9.7 10/22/2023 0953   GFRNONAA >60 04/28/2021 0934    BNP No results found for: "BNP"   Imaging:  MM 3D SCREENING MAMMOGRAM BILATERAL BREAST Result Date: 11/21/2023 CLINICAL DATA:  Screening. EXAM: DIGITAL SCREENING BILATERAL MAMMOGRAM WITH TOMOSYNTHESIS AND CAD TECHNIQUE: Bilateral screening digital craniocaudal and mediolateral oblique mammograms were obtained. Bilateral screening digital breast tomosynthesis was  performed. The images were evaluated with computer-aided detection. COMPARISON:  Previous exam(s). ACR Breast Density Category d: The breasts are extremely dense, which lowers the sensitivity of mammography. FINDINGS: There are no findings suspicious for malignancy. IMPRESSION: No mammographic evidence of malignancy. A result letter of this screening mammogram will be mailed directly to the patient. RECOMMENDATION: Screening mammogram in one year. (Code:SM-B-01Y) BI-RADS CATEGORY  1: Negative. Electronically Signed   By: Frederico Hamman M.D.   On: 11/21/2023 18:33    Administration History     None          Latest Ref Rng & Units  05/30/2022    9:59 AM  PFT Results  FVC-Pre L 2.22   FVC-Predicted Pre % 93   FVC-Post L 2.30   FVC-Predicted Post % 96   Pre FEV1/FVC % % 93   Post FEV1/FCV % % 94   FEV1-Pre L 2.05   FEV1-Predicted Pre % 115   FEV1-Post L 2.15   DLCO uncorrected ml/min/mmHg 18.86   DLCO UNC% % 116   DLCO corrected ml/min/mmHg 19.03   DLCO COR %Predicted % 117   DLVA Predicted % 123   TLC L 3.80   TLC % Predicted % 88   RV % Predicted % 68     No results found for: "NITRICOXIDE"      Assessment & Plan:   Asthma, intrinsic Stable on current regimen. She has an upper airway component to her symptoms as well. She had COVID pna 05/2023 but appears to have recovered well without residual symptoms. Continue current regimen. Action plan in place.  Patient Instructions  -Continue Advair 1 puffs Twice daily. Brush tongue and rinse mouth afterwards. Use with spacer.  -Continue albuterol inhaler 2 puffs or 3 mL nebulizer every 6 hours as needed for shortness of breath or cough -Continue sinuglair 10 mg At bedtime -Continue protonix 40 mg Twice daily -Continue pepcid 10 mg daily  -Continue Xyzal 5 mg At bedtime  -Continue nasal irrigation treatments per ENT -Continue flonase 2 sprays each nostril Twice daily for nasal congestion/drainage  -Continue Chlortab 4 mg over the counter At bedtime for cough -Continue mucinex over the counter   Attend CT chest once scheduled. I'll call you with results     Follow up in 6 months with Dr. Sherene Sires or Philis Nettle. If symptoms worsen, please contact office for sooner follow up or seek emergency care.   Pneumonia due to COVID-19 virus August 2024. Clinically improved. Will plan repeat CT chest for follow up to ensure infiltrates resolved and no evidence of post COVID ILD.   Upper airway cough syndrome Stable. Continue current allergic rhinitis/GERD regimen.      Noemi Chapel, NP 11/27/2023  Pt aware and understands NP's role.

## 2023-11-29 DIAGNOSIS — M546 Pain in thoracic spine: Secondary | ICD-10-CM | POA: Diagnosis not present

## 2023-12-05 ENCOUNTER — Ambulatory Visit (INDEPENDENT_AMBULATORY_CARE_PROVIDER_SITE_OTHER): Payer: Medicare HMO | Admitting: Sports Medicine

## 2023-12-05 ENCOUNTER — Other Ambulatory Visit: Payer: Self-pay

## 2023-12-05 DIAGNOSIS — M65311 Trigger thumb, right thumb: Secondary | ICD-10-CM | POA: Diagnosis not present

## 2023-12-05 DIAGNOSIS — M65312 Trigger thumb, left thumb: Secondary | ICD-10-CM

## 2023-12-05 DIAGNOSIS — M65332 Trigger finger, left middle finger: Secondary | ICD-10-CM

## 2023-12-05 DIAGNOSIS — M65341 Trigger finger, right ring finger: Secondary | ICD-10-CM

## 2023-12-05 DIAGNOSIS — M65322 Trigger finger, left index finger: Secondary | ICD-10-CM

## 2023-12-05 DIAGNOSIS — M65331 Trigger finger, right middle finger: Secondary | ICD-10-CM

## 2023-12-05 DIAGNOSIS — M65352 Trigger finger, left little finger: Secondary | ICD-10-CM

## 2023-12-05 DIAGNOSIS — M65321 Trigger finger, right index finger: Secondary | ICD-10-CM | POA: Diagnosis not present

## 2023-12-05 DIAGNOSIS — M65351 Trigger finger, right little finger: Secondary | ICD-10-CM

## 2023-12-05 DIAGNOSIS — M65342 Trigger finger, left ring finger: Secondary | ICD-10-CM

## 2023-12-05 NOTE — Progress Notes (Signed)
    Procedures performed today:    Procedure: Real-time Ultrasound Guided injection of the left second flexor tendon sheath Device: Samsung HS60  Verbal informed consent obtained.  Time-out conducted.  Noted no overlying erythema, induration, or other signs of local infection.  Skin prepped in a sterile fashion.  Local anesthesia: Topical Ethyl chloride.  With sterile technique and under real time ultrasound guidance: Noted mild synovitis, 0.5 cc lidocaine, 0.5 cc kenalog 40 injected easily. Completed without difficulty  Advised to call if fevers/chills, erythema, induration, drainage, or persistent bleeding.  Images permanently stored and available for review in PACS.  Impression: Technically successful ultrasound guided injection.  Procedure: Real-time Ultrasound Guided injection of the right second flexor tendon sheath Device: Samsung HS60  Verbal informed consent obtained.  Time-out conducted.  Noted no overlying erythema, induration, or other signs of local infection.  Skin prepped in a sterile fashion.  Local anesthesia: Topical Ethyl chloride.  With sterile technique and under real time ultrasound guidance: Noted mild synovitis, 0.5 cc lidocaine, 0.5 cc kenalog 40 injected easily. Completed without difficulty  Advised to call if fevers/chills, erythema, induration, drainage, or persistent bleeding.  Images permanently stored and available for review in PACS.  Impression: Technically successful ultrasound guided injection.  Independent interpretation of notes and tests performed by another provider:   None.  Brief History, Exam, Impression, and Recommendations:    Trigger finger of all digits of both hands Recurrence of bilateral stenosing tenosynovitis at this time second digit, we injected both sides today, return as needed.    ____________________________________________ Ihor Austin. Benjamin Stain, M.D., ABFM., CAQSM., AME. Primary Care and Sports Medicine [place]  [facility]  Adjunct Professor of Family Medicine  [employer]  Restaurant manager, fast food

## 2023-12-05 NOTE — Assessment & Plan Note (Signed)
Recurrence of bilateral stenosing tenosynovitis at this time second digit, we injected both sides today, return as needed.

## 2023-12-08 ENCOUNTER — Encounter: Payer: Self-pay | Admitting: Gastroenterology

## 2023-12-08 ENCOUNTER — Other Ambulatory Visit: Payer: Self-pay | Admitting: Gastroenterology

## 2023-12-08 ENCOUNTER — Other Ambulatory Visit: Payer: Self-pay

## 2023-12-08 ENCOUNTER — Ambulatory Visit: Payer: Medicare HMO | Admitting: Gastroenterology

## 2023-12-08 VITALS — BP 124/68 | HR 84 | Ht 59.0 in | Wt 120.0 lb

## 2023-12-08 DIAGNOSIS — Z860101 Personal history of adenomatous and serrated colon polyps: Secondary | ICD-10-CM

## 2023-12-08 DIAGNOSIS — B379 Candidiasis, unspecified: Secondary | ICD-10-CM

## 2023-12-08 DIAGNOSIS — K589 Irritable bowel syndrome without diarrhea: Secondary | ICD-10-CM

## 2023-12-08 DIAGNOSIS — K582 Mixed irritable bowel syndrome: Secondary | ICD-10-CM

## 2023-12-08 DIAGNOSIS — R09A2 Foreign body sensation, throat: Secondary | ICD-10-CM

## 2023-12-08 DIAGNOSIS — R140 Abdominal distension (gaseous): Secondary | ICD-10-CM

## 2023-12-08 DIAGNOSIS — R1310 Dysphagia, unspecified: Secondary | ICD-10-CM

## 2023-12-08 DIAGNOSIS — K2100 Gastro-esophageal reflux disease with esophagitis, without bleeding: Secondary | ICD-10-CM | POA: Diagnosis not present

## 2023-12-08 DIAGNOSIS — K2970 Gastritis, unspecified, without bleeding: Secondary | ICD-10-CM

## 2023-12-08 MED ORDER — IBGARD 90 MG PO CPCR: ORAL_CAPSULE | ORAL | Status: DC

## 2023-12-08 MED ORDER — VOQUEZNA 10 MG PO TABS: 20.0000 mg | ORAL_TABLET | Freq: Every day | ORAL | Status: DC

## 2023-12-08 MED ORDER — VOQUEZNA 20 MG PO TABS: 20.0000 mg | ORAL_TABLET | Freq: Every day | ORAL | 0 refills | Status: DC

## 2023-12-08 NOTE — Patient Instructions (Addendum)
 Samples of Voquezna  10mg  and IBgard given today.   _______________________________________________________  If your blood pressure at your visit was 140/90 or greater, please contact your primary care physician to follow up on this.  _______________________________________________________  If you are age 73 or older, your body mass index should be between 23-30. Your Body mass index is 24.24 kg/m. If this is out of the aforementioned range listed, please consider follow up with your Primary Care Provider.  If you are age 50 or younger, your body mass index should be between 19-25. Your Body mass index is 24.24 kg/m. If this is out of the aformentioned range listed, please consider follow up with your Primary Care Provider.   ________________________________________________________  The [facility] providers would like to encourage you to use [facility] to communicate with providers for non-urgent requests or questions.  Due to long hold times on the telephone, sending your provider a message by [facility] may be a faster and more efficient way to get a response.  Please allow 48 business hours for a response.  Please remember that this is for non-urgent requests.  _______________________________________________________  Due to recent changes in healthcare laws, you may see the results of your imaging and laboratory studies on MyChart before your provider has had a chance to review them.  We understand that in some cases there may be results that are confusing or concerning to you. Not all laboratory results come back in the same time frame and the provider may be waiting for multiple results in order to interpret others.  Please give [place] 48 hours in order for your provider to thoroughly review all the results before contacting the office for clarification of your results.        VISIT SUMMARY:  Rita Levine, a 73 year old female, visited today with concerns about a yeast infection and  swallowing difficulties. She experiences persistent hoarseness and dysphagia, and has a tender thyroid nodule. She also has irregular bowel habits and a history of gastritis. We discussed her symptoms and treatment options in detail.  YOUR PLAN:  -GASTROESOPHAGEAL REFLUX DISEASE (GERD): GERD is a condition where stomach acid frequently flows back into the tube connecting your mouth and stomach, causing irritation. We will start you on Voquezna 20 mg once daily before dinner for two months, then reduce to 10 mg. You can add Pepcid in the evening if needed. If symptoms persist, we may consider a modified barium swallow and barium esophagram to check for subtle esophageal rings.  -GASTRITIS: Gastritis is the inflammation of the stomach lining, often due to acid, medications, or infection. Continue taking your acid reducer and avoid NSAIDs and other irritants. You can consider taking peppermint oil capsules (IV guard) for bloating, up to three times a day as needed.  -IRRITABLE BOWEL SYNDROME (IBS): IBS is a common disorder affecting the large intestine, causing symptoms like cramping, abdominal pain, bloating, gas, and diarrhea or constipation. Monitor your bowel habits and consider adding fiber through fruits, cooked vegetables, oatmeal, and nuts. Avoid excessive fiber intake to prevent adverse effects.  -RECURRENT YEAST INFECTIONS: You are concerned about recurrent yeast infections, but recent evaluations showed no infection. We will monitor for symptoms and avoid prophylactic antifungal treatment unless symptoms recur.  -THYROID NODULE: A thyroid nodule is a growth in the thyroid gland that can cause symptoms like swelling and tenderness. Follow up with an ENT specialist for further evaluation. We will also order a modified barium swallow test to assess the structural impact on your  esophagus.  -GENERAL HEALTH MAINTENANCE: It is important to gargle and drink a cup of water after using your steroid  inhaler to prevent oral thrush.  INSTRUCTIONS:  Please schedule a follow-up visit in three months. Follow up with an ENT specialist for further evaluation of your thyroid nodule. We will also order a modified barium swallow test to assess the structural impact on your esophagus.  I appreciate the opportunity to care for you. Marsa Aris MD

## 2023-12-08 NOTE — Progress Notes (Signed)
 Rita Levine    [id number]    Dec 18, 1950  Primary Care Physician:Kuneff, Ezequiel Essex, DO  Referring Physician: Natalia Leatherwood, DO [address] [place],  [place] [address]   Chief complaint:  recurrent yeast infection  Discussed the use of AI scribe software for clinical note transcription with the patient, who gave verbal consent to proceed.  History of Present Illness   Rita Levine is a 73 year old female who presents with concerns about yeast infection and swallowing difficulties.  She experiences persistent dysphagia and hoarseness, describing her voice as 'harsh' and 'gruff,' akin to a smoker, despite never having smoked. She takes Protonix twice daily without improvement and is not currently on Pepcid. She administers Protonix separately from her thyroid medication, waiting half an hour before breakfast. She has a thyroid nodule that feels swollen and tender, which she suspects may be affecting her voice. She plans to consult an ENT specialist regarding this issue.  She is concerned about a possible recurrence of a yeast infection. Her primary care provider suggested maintenance treatment due to tightness she experiences, although recent evaluations showed no infection or precancerous changes. She is worried about developing resistance to oral antifungal medications, having heard of cases requiring IV treatment.  Her bowel habits are irregular, with bowel movements occurring multiple times a day on three out of seven days, sometimes up to four times a day. No significant changes with dietary fiber intake. She is concerned about skipping days due to a past obstruction. No bleeding is reported.  She has a history of gastritis, with acid being a suspected cause. She denies any pain but feels tense and bloated. She does not currently take anything for bloating.     EGD November 24, 2023 - Z- line regular, 36 cm from the incisors. - Normal esophagus. - 2 cm hiatal hernia. - A  single papule ( nodule) found in the stomach. Biopsied. - Gastritis. Biopsied. - Normal examined duodenum. Colonoscopy November 24, 2023 - Four 3 to 7 mm polyps in the transverse colon, in the ascending colon and in the cecum, removed with a cold snare. Resected and retrieved. - Severe diverticulosis in the sigmoid colon, in the descending colon, in the transverse colon and in the ascending colon. - Non- bleeding external and internal hemorrhoids. 1. Surgical [P], gastric antrum nodule bx :      FUNDIC-TYPE GASTRIC MUCOSA WITH CHANGES CONSISTENT WITH A FUNDIC GLAND POLYP      MINIMAL CHRONIC GASTRITIS      NEGATIVE FOR H. PYLORI, INTESTINAL METAPLASIA, DYSPLASIA AND CARCINOMA       2. Surgical [P], gastric antrum and gastric body bx :      REACTIVE GASTROPATHY      BENIGN FUNDIC GLAND POLYP      NEGATIVE FOR H. PYLORI, INTESTINAL METAPLASIA, DYSPLASIA CARCINOMA       3. Surgical [P], colon, cecum polyp x 2, ascending x 1, transverse x 1, polyp (4) :      TUBULAR ADENOMA, 3 FRAGMENTS      NEGATIVE FOR HIGH-GRADE DYSPLASIA CARCINOMA    02/05/2022 EGD with white esophageal plaques found consistent with candidiasis and small hiatal hernia. Patient started on Diflucan.  Barium esophagram 2023 suggested motility disorder of esophagus, no strictures or stenosis.   EGD 2020 for dysphagia was normal.  08/05/2016 colonoscopy with diverticulosis and hemorrhoids and otherwise normal.  Repeat recommended 10 years   Outpatient Encounter Medications  as of 12/08/2023  Medication Sig   albuterol (PROAIR HFA) 108 (90 Base) MCG/ACT inhaler Inhale 2 puffs into the lungs every 6 (six) hours as needed.   albuterol (PROVENTIL) (2.5 MG/3ML) 0.083% nebulizer solution Take 3 mLs (2.5 mg total) by nebulization every 6 (six) hours as needed.   ARMOUR THYROID 15 MG tablet Take 1 tablet by mouth in the AM Once a day   ARMOUR THYROID 30 MG tablet Take 1 tablet by mouth in the AM once a day   Ascorbic Acid (VITAMIN C)  1000 MG tablet Take 3,000 mg by mouth daily.   atenolol (TENORMIN) 25 MG tablet Take 0.5-1 tablets (12.5-25 mg total) by mouth daily.   budesonide (PULMICORT) 0.5 MG/2ML nebulizer solution TAKE 2 MLS (0.5 MG TOTAL) BY NEBULIZATION IN THE MORNING AND AT BEDTIME.   calcium carbonate (TUMS - DOSED IN MG ELEMENTAL CALCIUM) 500 MG chewable tablet Chew 3 tablets by mouth daily as needed for indigestion or heartburn.   Carboxymethylcellulose Sodium (THERATEARS OP) Place 1 drop into both eyes 2 (two) times daily.   Cholecalciferol (VITAMIN D) 125 MCG (5000 UT) CAPS Take 5,000 Units by mouth daily.   clotrimazole (MYCELEX) 10 MG troche Take 1 tablet (10 mg total) by mouth 4 (four) times daily as needed.   Coenzyme Q10 (COQ-10) 100 MG CAPS Take 100 mg by mouth daily.   ezetimibe (ZETIA) 10 MG tablet TAKE 1 TABLET BY MOUTH EVERY DAY   fexofenadine (ALLERGY RELIEF) 180 MG tablet Take 1 tablet (180 mg total) by mouth daily.   fluconazole (DIFLUCAN) 150 MG tablet Take 1 tablet (150 mg total) by mouth daily.   fluticasone (FLONASE) 50 MCG/ACT nasal spray Place 2 sprays into both nostrils daily.   folic acid (FOLVITE) 1 MG tablet SMARTSIG:2 Tablet(s) By Mouth   hydrochlorothiazide (MICROZIDE) 12.5 MG capsule Take 1 capsule (12.5 mg total) by mouth daily as needed.   hyoscyamine (LEVSIN) 0.125 MG tablet Take 1 tablet (0.125 mg total) by mouth every 6 (six) hours as needed.   LORazepam (ATIVAN) 0.5 MG tablet Take 1 tablet (0.5 mg total) by mouth 2 (two) times daily as needed for anxiety.   mometasone (ELOCON) 0.1 % lotion Apply 1 application topically daily as needed (ear irritation).    montelukast (SINGULAIR) 10 MG tablet TAKE 1 TABLET BY MOUTH EVERYDAY AT BEDTIME   ondansetron (ZOFRAN-ODT) 8 MG disintegrating tablet TAKE 1 TABLET BY MOUTH EVERY 8 HOURS AS NEEDED FOR NAUSEA OR VOMITING.   pantoprazole (PROTONIX) 40 MG tablet Take 1 tablet (40 mg total) by mouth 2 (two) times daily.   potassium chloride SA  (KLOR-CON M) 20 MEQ tablet Take by mouth.   Probiotic CAPS Take 1 capsule by mouth daily.   rosuvastatin (CRESTOR) 10 MG tablet TAKE 1 TABLET BY MOUTH THREE TIMES A WEEK. PLEASE KEEP SCHEDULED APPOINTMENT FOR FUTURE REFILLS.   tiZANidine (ZANAFLEX) 4 MG tablet Take 1 tablet (4 mg total) by mouth every 6 (six) hours as needed for muscle spasms.   WIXELA INHUB 250-50 MCG/ACT AEPB INHALE 1 PUFF INTO THE LUNGS IN THE MORNING AND AT BEDTIME.   zinc gluconate 50 MG tablet Take 100 mg by mouth daily.   zolpidem (AMBIEN) 5 MG tablet Take 1 tablet (5 mg total) by mouth at bedtime. As needed   famotidine (PEPCID) 40 MG tablet Take 1 tablet (40 mg total) by mouth 2 (two) times daily. (Patient not taking: Reported on 12/08/2023)   [DISCONTINUED] fluconazole (DIFLUCAN) 150 MG tablet 1  tab po q 14 days   No facility-administered encounter medications on file as of 12/08/2023.    Allergies as of 12/08/2023 - Review Complete 12/08/2023  Allergen Reaction Noted   Paxlovid [nirmatrelvir-ritonavir] Shortness Of Breath and Palpitations 07/23/2022   Tetanus toxoid Swelling 05/18/2016   Avelox [moxifloxacin hcl in nacl] Hives 03/29/2011   Cephalexin Hives 03/29/2011   Codeine Hives 03/29/2011   Cymbalta [duloxetine hcl] Other (See Comments) 07/23/2022   Erythromycin Hives 03/29/2011   Propranolol Hives 04/15/2016   Sulfa antibiotics Hives 03/29/2011   Sulfamethoxazole-trimethoprim Hives 03/29/2011   Tramadol Hives 04/05/2014   Flagyl [metronidazole] Other (See Comments) 12/01/2019   Statins Other (See Comments) 12/03/2019    Past Medical History:  Diagnosis Date   Allergic rhinitis    Allergic rhinitis due to pollen 12/03/2019   Allergy    Anemia    Asthma    Cataract    Chest pain, unspecified 04/07/2014   Chicken pox    Colon polyp    Diverticulosis    Dysphagia    Dysrhythmia    Palpitations   Fibromyalgia    Food allergy    Gallstone    GERD (gastroesophageal reflux disease)    Hiatal  hernia    Hypercholesteremia    Hypertension    Hypothyroidism    IBS (irritable bowel syndrome)    Ingrown toenail 08/23/2020   Migraine headache    occasionally   Morton neuroma, 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   Osteoarthritis    Osteopenia    Pneumonia    PONV (postoperative nausea and vomiting)    Pulmonary hypertension (HCC)    pt denies, pt states she was tested for it but was not diagnosed with it   Rectal bleeding    SBO (small bowel obstruction) (HCC) 10/20/2017   Seasonal allergies    Seborrheic dermatitis of scalp 06/02/2020   Subconjunctival hemorrhage of left eye 08/01/2022   Thyroid nodule    Trigger finger of left and right ring fingers 01/11/2021    Past Surgical History:  Procedure Laterality Date   APPENDECTOMY  1982   BREAST EXCISIONAL BIOPSY Right 1995   benign   BREAST LUMPECTOMY  1991   CARPAL TUNNEL RELEASE Right 2006   Right wrist   CESAREAN SECTION     x 2   CHOLECYSTECTOMY N/A 07/25/2020   Procedure: LAPAROSCOPIC CHOLECYSTECTOMY;  Surgeon: Almond Lint, MD;  Location: [facility] OR;  Service: General;  Laterality: N/A;   COLON RESECTION  1990   COLONOSCOPY     CT ABDOMEN PELVIS W ([facility] HX)  01/03/2020   IMAGE MRI brain:  04/2012   No focal IAC or inner ear lesion to explain hearing loss. Slightly greater than expected number of subcortical T2- hyperintensities bilaterally.  These are nonspecific.  They can be seen in the setting of chronic migraine headaches, demyelinating process, chronic microvascular ischemic, prior infection or inflammation, or vasculitis   IMAGE MRI lumbar:  05/01/2006   Mild central canal stenosis with facet arthropathy and mildly bulging disc at L4-5.  There is mild narrowing in the left lateral recess at this level.  No definite neural impingement.  Appearance at this level has not markedly Moderately severe facet arthropathy at L5-S1 with mild interval progression of a diffuse broad based disc bulge.  There is mild biforaminal narrowing.   Central canal is open   LAPAROSCOPIC ABDOMINAL EXPLORATION  2019   adhesion removal> caused bowel blockage    NASAL SEPTUM SURGERY  2004   TONSILLECTOMY  1965  TOTAL ABDOMINAL HYSTERECTOMY W/ BILATERAL SALPINGOOPHORECTOMY  1988   TUBAL LIGATION  1974   UPPER GASTROINTESTINAL ENDOSCOPY  2020    Family History  Problem Relation Age of Onset   Hyperlipidemia Father    Hypertension Father    Arthritis Father    Diabetes Father    Asthma Father    COPD Father    Early death Father    Parkinson's disease Father    Hypertension Mother    Hyperlipidemia Mother    Macular degeneration Mother    Arthritis Mother    Hearing loss Mother    Heart disease Mother    Kidney disease Mother    Throat cancer Brother        lung, and tongue   Arthritis Brother    COPD Brother    Diabetes type II Sister    COPD Sister    Heart disease Sister    Hypertension Sister    Thyroid cancer Sister    Lung cancer Sister    Early death Sister    COPD Sister    Breast cancer Maternal Aunt    Lung cancer Other        uncle   Emphysema Maternal Aunt    Emphysema Maternal Uncle    Clotting disorder Sister    Brain cancer Sister        brain tumor- not malignant   Breast cancer Cousin    Cancer Sister    Alcohol abuse Sister    COPD Sister    Early death Sister    Alcohol abuse Brother    Arthritis Brother    Depression Brother    Diabetes Brother    Hypercholesterolemia Brother    Colon cancer Neg Hx    Esophageal cancer Neg Hx    Rectal cancer Neg Hx    Stomach cancer Neg Hx     Social History   Socioeconomic History   Marital status: Widowed    Spouse name: Celesta Aver   Number of children: 2   Years of education: Not on file   Highest education level: Some college, no degree  Occupational History   Occupation: homemaker  Tobacco Use   Smoking status: Never    Passive exposure: Past   Smokeless tobacco: Never  Vaping Use   Vaping status: Never Used  Substance and Sexual  Activity   Alcohol use: Yes    Comment: very rarely   Drug use: No   Sexual activity: Yes    Partners: Male    Comment: married  Other Topics Concern   Not on file  Social History Narrative   Marital status/children/pets: married, 2 children   Education/employment: HS grad. retired   Field seismologist:      -smoke alarm in the home:Yes     - wears seatbelt: Yes     - Feels safe in their relationships: Yes   Social Drivers of Corporate investment banker Strain: Low Risk  (12/01/2023)   Overall Financial Resource Strain (CARDIA)    Difficulty of Paying Living Expenses: Not hard at all  Food Insecurity: No Food Insecurity (12/01/2023)   Hunger Vital Sign    Worried About Running Out of Food in the Last Year: Never true    Ran Out of Food in the Last Year: Never true  Transportation Needs: No Transportation Needs (12/01/2023)   PRAPARE - Administrator, Civil Service (Medical): No    Lack of Transportation (Non-Medical): No  Physical Activity: Sufficiently  Active (12/01/2023)   Exercise Vital Sign    Days of Exercise per Week: 5 days    Minutes of Exercise per Session: 30 min  Stress: No Stress Concern Present (12/01/2023)   [employer] of Occupational Health - Occupational Stress Questionnaire    Feeling of Stress : Only a little  Social Connections: Moderately Integrated (12/01/2023)   Social Connection and Isolation Panel [NHANES]    Frequency of Communication with Friends and Family: More than three times a week    Frequency of Social Gatherings with Friends and Family: Twice a week    Attends Religious Services: More than 4 times per year    Active Member of [employer] or Organizations: Yes    Attends Banker Meetings: More than 4 times per year    Marital Status: Widowed  Intimate Partner Violence: Not At Risk (07/09/2023)   Humiliation, Afraid, Rape, and Kick questionnaire    Fear of Current or Ex-Partner: No    Emotionally Abused: No    Physically Abused:  No    Sexually Abused: No      Review of systems: All other review of systems negative except as mentioned in the HPI.   Physical Exam: Vitals:   12/08/23 1328  BP: 124/68  Pulse: 84   Body mass index is 24.24 kg/m. Gen:      No acute distress HEENT:  sclera anicteric CV: s1s2 rrr, no murmur Lungs: B/l clear. Abd:      soft, non-tender; no palpable masses, no distension Ext:    No edema Neuro: alert and oriented x 3 Psych: normal mood and affect  Data Reviewed:  Reviewed labs, radiology imaging, old records and pertinent past GI work up     Assessment and Plan    Gastroesophageal Reflux Disease (GERD) Persistent GERD symptoms including hoarseness and globus sensation. Protonix twice daily ineffective. Discussed adding Pepcid at night and Voquezna as alternative treatment. Considered subtle esophageal rings detectable via barium swallow. - Start Voquezna 20 mg once daily before dinner for two months, then reduce to 10 mg - Add Pepcid in the evening if needed - Consider modified barium swallow and barium esophagram if symptoms persist, to be scheduled after ENT follow-up  Gastritis Mild gastritis likely due to acid. Biopsies negative for H. pylori and autoimmune gastritis. No current pain. Discussed common causes including acid, medications, and infection. - Continue acid reducer - Avoid NSAIDs and other irritants  Irritable Bowel Syndrome (IBS) Irregular bowel habits consistent with IBS. No significant changes with dietary modifications. Colonoscopy showed some improvement. Discussed benefits and risks of adding dietary fiber. - Monitor bowel habits - Consider adding fiber through fruits, cooked vegetables, oatmeal, and nuts - Avoid excessive fiber intake to prevent adverse effects -- Consider IBgard (peppermint oil capsules) for bloating, up to three times a day as needed  Recurrent Yeast Infections Concerns about recurrent yeast infections. Previous examination  showed no infection. Biopsies of stomach nodules negative for precancer and infection. Discussed resistance risk with prophylactic antifungals. Prefers to avoid IV treatment. - Monitor for symptoms of yeast infection - Avoid prophylactic antifungal treatment unless symptoms recur Discussed importance of gargling and drinking water after using steroid inhalers to prevent oral thrush. - Gargle and drink a cup of water after using steroid inhaler  Thyroid Nodule Tender thyroid nodule impacting voice and swallowing. Physical exam confirmed tender nodule. Discussed potential pressure on larynx /trachea [windpipe] and esophagus. - Follow up with ENT specialist for further evaluation of thyroid nodule -  Order modified barium swallow test to assess structural impact on esophagus  History of adenomatous colon polyps, due for surveillance colonoscopy in February 2028   Follow-up - Schedule follow-up visit in three months.        The patient was provided an opportunity to ask questions and all were answered. The patient agreed with the plan and demonstrated an understanding of the instructions.  Iona Beard , MD    CC: Natalia Leatherwood, DO

## 2023-12-09 ENCOUNTER — Other Ambulatory Visit (HOSPITAL_COMMUNITY): Payer: Self-pay | Admitting: Gastroenterology

## 2023-12-09 DIAGNOSIS — R059 Cough, unspecified: Secondary | ICD-10-CM

## 2023-12-09 DIAGNOSIS — R1310 Dysphagia, unspecified: Secondary | ICD-10-CM

## 2023-12-11 ENCOUNTER — Ambulatory Visit: Payer: Medicare HMO

## 2023-12-11 DIAGNOSIS — Z8616 Personal history of COVID-19: Secondary | ICD-10-CM | POA: Diagnosis not present

## 2023-12-11 DIAGNOSIS — R059 Cough, unspecified: Secondary | ICD-10-CM

## 2023-12-11 DIAGNOSIS — R0600 Dyspnea, unspecified: Secondary | ICD-10-CM

## 2023-12-11 DIAGNOSIS — J1282 Pneumonia due to coronavirus disease 2019: Secondary | ICD-10-CM

## 2023-12-16 ENCOUNTER — Encounter (HOSPITAL_BASED_OUTPATIENT_CLINIC_OR_DEPARTMENT_OTHER): Payer: Self-pay | Admitting: *Deleted

## 2023-12-16 ENCOUNTER — Encounter (HOSPITAL_BASED_OUTPATIENT_CLINIC_OR_DEPARTMENT_OTHER): Payer: Self-pay | Admitting: Cardiovascular Disease

## 2023-12-16 ENCOUNTER — Other Ambulatory Visit (HOSPITAL_COMMUNITY): Payer: Self-pay

## 2023-12-16 ENCOUNTER — Telehealth: Payer: Self-pay | Admitting: Pharmacy Technician

## 2023-12-16 ENCOUNTER — Ambulatory Visit (HOSPITAL_BASED_OUTPATIENT_CLINIC_OR_DEPARTMENT_OTHER): Payer: Medicare HMO | Admitting: Cardiovascular Disease

## 2023-12-16 VITALS — BP 132/78 | HR 66 | Ht 59.0 in | Wt 120.0 lb

## 2023-12-16 DIAGNOSIS — R079 Chest pain, unspecified: Secondary | ICD-10-CM

## 2023-12-16 DIAGNOSIS — I2510 Atherosclerotic heart disease of native coronary artery without angina pectoris: Secondary | ICD-10-CM | POA: Diagnosis not present

## 2023-12-16 DIAGNOSIS — I10 Essential (primary) hypertension: Secondary | ICD-10-CM

## 2023-12-16 DIAGNOSIS — I2584 Coronary atherosclerosis due to calcified coronary lesion: Secondary | ICD-10-CM

## 2023-12-16 DIAGNOSIS — Z5181 Encounter for therapeutic drug level monitoring: Secondary | ICD-10-CM | POA: Diagnosis not present

## 2023-12-16 DIAGNOSIS — I700 Atherosclerosis of aorta: Secondary | ICD-10-CM | POA: Diagnosis not present

## 2023-12-16 DIAGNOSIS — Z01812 Encounter for preprocedural laboratory examination: Secondary | ICD-10-CM

## 2023-12-16 MED ORDER — REPATHA SURECLICK 140 MG/ML ~~LOC~~ SOAJ: 140.0000 mg | SUBCUTANEOUS | 3 refills | Status: DC

## 2023-12-16 MED ORDER — METOPROLOL TARTRATE 50 MG PO TABS: ORAL_TABLET | ORAL | 0 refills | Status: DC

## 2023-12-16 NOTE — Telephone Encounter (Signed)
 Pharmacy Patient Advocate Encounter  Received notification from [employer] that Prior Authorization for Repatha has been APPROVED from 10/15/23 to 10/13/24. Ran test claim, Copay is $144.24 One month. This test claim was processed through [facility]- copay amounts may vary at other pharmacies due to pharmacy/plan contracts, or as the patient moves through the different stages of their insurance plan.   PA #/Case ID/Reference #: [id number]

## 2023-12-16 NOTE — Progress Notes (Signed)
 Cardiology Office Note:  .   Date:  12/16/2023  ID:  Rita Levine, DOB 10/14/1951, MRN [id number] PCP: Natalia Leatherwood, DO  [facility] Providers Cardiologist:  Lance Muss, MD    History of Present Illness: .   Rita Levine is a 73 y.o. female with hypertension, hyperlipidemia, aortic atherosclerosis here for follow-up.  She was previously a patient of Dr. Eldridge Dace.  She had a chest CT in 2021 that revealed advanced atherosclerosis of the aorta and iliacs.  She has no personal family history of CAD.  She had a stress test in 2015 that was negative.  She did not tolerate simvastatin but has done well on rosuvastatin.  She had a ambulatory monitor in 2021 that revealed some rare PACs but was otherwise unremarkable.  She was last seen 07/2022 and was doing well.  [name] has been experiencing intermittent chest tightness for approximately six months, occurring even at rest without correlation to specific activities. Her breathing is labored, particularly when climbing stairs or walking extensively, and has worsened over the past couple of months. A history of stress, including a bout of COVID pneumonia in August, may contribute to her overall health concerns.  She experiences palpitations, for which she has previously worn a monitor that confirmed her presence but did not indicate any dangerous arrhythmias.  She has a history of fibromyalgia, which has been exacerbated by statin use in the past, causing muscle pain. She currently takes rosuvastatin three times a week and Zetia for cholesterol management. She also takes atenolol and hydrochlorothiazide for blood pressure control.  Her diet has been inconsistent, initially due to a lack of appetite and now due to stress eating. She tries to avoid excessive salt and fried foods but has recently craved chips. She walks daily for at least thirty minutes, which she maintains despite her symptoms.  Family history reveals her father had  Parkinson's disease and breathing issues, possibly COPD, and passed away from pneumonia at age 36. Her mother lived a long life. She has a history of smoking but did not specify the duration or cessation.  She reports varicose veins in her legs, which sometimes bulge but do not cause pain. She does not wear compression socks. She spends more time standing than sitting during the day.     ROS:  As per HPI  Studies Reviewed: Marland Kitchen   EKG Interpretation Date/Time:  Tuesday December 16 2023 10:54:41 EST Ventricular Rate:  66 PR Interval:  170 QRS Duration:  84 QT Interval:  388 QTC Calculation: 406 R Axis:   -12  Text Interpretation: Normal sinus rhythm Inferior infarct , age undetermined No significant change since last tracing Confirmed by Chilton Si ([id number]) on 12/16/2023 11:24:20 AM   Echo 07/2022: 1. Left ventricular ejection fraction, by estimation, is 60 to 65%. The  left ventricle has normal function. The left ventricle has no regional  wall motion abnormalities. Left ventricular diastolic parameters are  consistent with Grade I diastolic  dysfunction (impaired relaxation).   2. Right ventricular systolic function is normal. The right ventricular  size is normal. There is normal pulmonary artery systolic pressure. The  estimated right ventricular systolic pressure is 23.8 mmHg.   3. The mitral valve is normal in structure. Trivial mitral valve  regurgitation. No evidence of mitral stenosis.   4. The aortic valve is tricuspid. Aortic valve regurgitation is not  visualized. No aortic stenosis is present.   5. The inferior vena cava is normal in  size with greater than 50%  respiratory variability, suggesting right atrial pressure of 3 mmHg.   Ambul Normal sinus rhythm. Rare, briuef runs of PACs. No sustained pathologic arrhythmias. Risk Assessment/Calculations:             Physical Exam:   VS:  BP 132/78   Pulse 66   Ht 4\' 11"  (1.499 m)   Wt 120 lb (54.4 kg)   SpO2 96%    BMI 24.24 kg/m  , BMI Body mass index is 24.24 kg/m. GENERAL:  Well appearing HEENT: Pupils equal round and reactive, fundi not visualized, oral mucosa unremarkable NECK:  No jugular venous distention, waveform within normal limits, carotid upstroke brisk and symmetric, no bruits, no thyromegaly LUNGS:  Clear to auscultation bilaterally HEART:  RRR.  PMI not displaced or sustained,S1 and S2 within normal limits, no S3, no S4, no clicks, no rubs, no murmurs ABD:  Flat, positive bowel sounds normal in frequency in pitch, no bruits, no rebound, no guarding, no midline pulsatile mass, no hepatomegaly, no splenomegaly EXT:  2 plus pulses throughout, no edema, no cyanosis no clubbing SKIN:  No rashes no nodules NEURO:  Cranial nerves II through XII grossly intact, motor grossly intact throughout PSYCH:  Cognitively intact, oriented to person place and time   ASSESSMENT AND PLAN: .    # Chest Tightness Ongoing for six months, not associated with exertion. No clear etiology identified. -Order coronary CT to assess for significant coronary artery disease.  # Hyperlipidemia LDL 84 despite being on rosuvastatin and Zetia. Patient experiences muscle aches with statin use. -Discontinue rosuvastatin for two weeks to assess for resolution of muscle aches. -Initiate Repatha for cholesterol management. -Recheck fasting cholesterol levels and comprehensive metabolic panel in three months.  # Varicose Veins No pain or discomfort, occasional "pings" in upper ankle area. -Recommend use of compression socks during the day. -Consider referral to vein specialist if symptoms worsen.  # General Health Maintenance -Initiate baby aspirin 81mg  daily due to presence of plaque. -Follow-up after labs are completed and post coronary CT.       Dispo: f/u in 3 months  Signed, Chilton Si, MD

## 2023-12-16 NOTE — Telephone Encounter (Signed)
 Pharmacy Patient Advocate Encounter   Received notification from CoverMyMeds that prior authorization for Repatha is required/requested.   Insurance verification completed.   The patient is insured through [employer] .   Per test claim: PA required; PA submitted to above mentioned insurance via CoverMyMeds Key/confirmation #/EOC [id number] Status is pending

## 2023-12-16 NOTE — Patient Instructions (Addendum)
 Medication Instructions:  STOP ROSUVASTATIN   START REPATHA 140 MG EVERY 2 WEEKS  MORNING OF CT HOLD ATENOLOL AND TAKE METOPROLOL 50 MG 2 HOURS PRIOR TO CARDIAC CT  *If you need a refill on your cardiac medications before your next appointment, please call your pharmacy*  Lab Work: BMET 1 WEEK PRIOR TO CT   FASTING LP/CMET IN 3 MONTHS ABOUT 1 WEEK PRIOR TO FOLLOW UP   If you have labs (blood work) drawn today and your tests are completely normal, you will receive your results only by: MyChart Message (if you have MyChart) OR A paper copy in the mail If you have any lab test that is abnormal or we need to change your treatment, we will call you to review the results.  Testing/Procedures: Your physician has requested that you have cardiac CT. Cardiac computed tomography (CT) is a painless test that uses an x-ray machine to take clear, detailed pictures of your heart. For further information please visit [email or web address] Please follow instruction sheet as given.  Follow-Up: At [facility], you and your health needs are our priority.  As part of our continuing mission to provide you with exceptional heart care, we have created designated Provider Care Teams.  These Care Teams include your primary Cardiologist (physician) and Advanced Practice Providers (APPs -  Physician Assistants and Nurse Practitioners) who all work together to provide you with the care you need, when you need it.  We recommend signing up for the patient portal called "MyChart".  Sign up information is provided on this After Visit Summary.  MyChart is used to connect with patients for Virtual Visits (Telemedicine).  Patients are able to view lab/test results, encounter notes, upcoming appointments, etc.  Non-urgent messages can be sent to your provider as well.   To learn more about what you can do with MyChart, go to [email or web address].    Your next appointment:   3 month(s)  Provider:   Chilton Si, MD, Eligha Bridegroom, NP, or Gillian Shields, NP    Other Instructions   Your cardiac CT will be scheduled at one of the below locations:   [facility] [address] [place], [place] [address] [phone]  OR  [facility] [address] [place], [place] [address] [phone]  OR   [facility] [address] [place], [place] [address] [phone]  OR   [facility] [address] [place], [place] [address] [phone]  If scheduled at [facility], please arrive at the [facility] (Entrance C2) of [facility] 30 minutes prior to test start time. You can use the FREE valet parking offered at entrance C (encouraged to control the heart rate for the test)  Proceed to the [facility] Radiology Department (first floor) to check-in and test prep.  All radiology patients and guests should use entrance C2 at [facility], accessed from [facility], even though the hospital's physical address listed is [address].    If scheduled at [facility] or [facility], please arrive 15 mins early for check-in and test prep.  There is spacious parking and easy access to the radiology department from the [facility] entrance. Please enter here and check-in with the desk attendant.   If scheduled at [facility], please arrive 30 minutes early for check-in and test prep.  Please follow these  instructions carefully (unless otherwise directed):  An IV will be required for this test and Nitroglycerin will be given.  Hold all erectile dysfunction medications at least 3 days (72 hrs) prior to test. (Ie viagra, cialis, sildenafil, tadalafil, etc)   On the Night Before the Test: Be sure to Drink plenty of water. Do not consume any  caffeinated/decaffeinated beverages or chocolate 12 hours prior to your test. Do not take any antihistamines 12 hours prior to your test.  On the Day of the Test: Drink plenty of water until 1 hour prior to the test. Do not eat any food 1 hour prior to test. You may take your regular medications prior to the test.  Take metoprolol (Lopressor) two hours prior to test. If you take Furosemide/Hydrochlorothiazide/Spironolactone/Chlorthalidone, please HOLD on the morning of the test. Patients who wear a continuous glucose monitor MUST remove the device prior to scanning. FEMALES- please wear underwire-free bra if available, avoid dresses & tight clothing  After the Test: Drink plenty of water. After receiving IV contrast, you may experience a mild flushed feeling. This is normal. On occasion, you may experience a mild rash up to 24 hours after the test. This is not dangerous. If this occurs, you can take Benadryl 25 mg, Zyrtec, Claritin, or Allegra and increase your fluid intake. (Patients taking Tikosyn should avoid Benadryl, and may take Zyrtec, Claritin, or Allegra) If you experience trouble breathing, this can be serious. If it is severe call 911 IMMEDIATELY. If it is mild, please call our office.  We will call to schedule your test 2-4 weeks out understanding that some insurance companies will need an authorization prior to the service being performed.   For more information and frequently asked questions, please visit our website : [email or web address]  For non-scheduling related questions, please contact the cardiac imaging nurse navigator should you have any questions/concerns: Cardiac Imaging Nurse Navigators Direct Office Dial: [phone]   For scheduling needs, including cancellations and rescheduling, please call [place], [phone].

## 2023-12-18 ENCOUNTER — Other Ambulatory Visit: Payer: Medicare HMO

## 2023-12-30 DIAGNOSIS — R490 Dysphonia: Secondary | ICD-10-CM | POA: Diagnosis not present

## 2023-12-30 DIAGNOSIS — E041 Nontoxic single thyroid nodule: Secondary | ICD-10-CM | POA: Diagnosis not present

## 2023-12-30 DIAGNOSIS — H6121 Impacted cerumen, right ear: Secondary | ICD-10-CM | POA: Diagnosis not present

## 2024-01-02 ENCOUNTER — Telehealth: Payer: Self-pay | Admitting: Pharmacy Technician

## 2024-01-02 ENCOUNTER — Other Ambulatory Visit (HOSPITAL_COMMUNITY): Payer: Self-pay

## 2024-01-02 ENCOUNTER — Encounter (HOSPITAL_COMMUNITY): Payer: Medicare HMO

## 2024-01-02 NOTE — Telephone Encounter (Signed)
 Pharmacy Patient Advocate Encounter  Received notification from [employer] that Prior Authorization for Voquezna 20MG  tablets has been APPROVED from 10-15-2023 to 10-13-2024   PA #/Case ID/Reference #: [id number]

## 2024-01-02 NOTE — Progress Notes (Signed)
 CT chest looks good.  Does have some coronary artery calcifications.  Can be managed by her PCP or cardiologist.  No concerning findings in the lungs.

## 2024-01-02 NOTE — Telephone Encounter (Signed)
 I called patient to inform her that Voquenza was approved. She thought she was not going to able to afford the copay but will check to see how much it is.

## 2024-01-02 NOTE — Telephone Encounter (Signed)
 PA has been submitted, and telephone encounter has been created. Please see telephone encounter dated 3.21.25.

## 2024-01-02 NOTE — Telephone Encounter (Signed)
 Pharmacy Patient Advocate Encounter   Received notification from RX Request Messages that prior authorization for VOQUEZNA 20MG  is required/requested.   Insurance verification completed.   The patient is insured through [employer] [facility] .   Per test claim: PA required; PA submitted to above mentioned insurance via CoverMyMeds Key/confirmation #/EOC [id number] Status is pending

## 2024-01-05 ENCOUNTER — Ambulatory Visit (HOSPITAL_COMMUNITY)
Admission: RE | Admit: 2024-01-05 | Discharge: 2024-01-05 | Disposition: A | Discharge status: Home / Self Care | Payer: Medicare HMO | Source: Ambulatory Visit | Attending: Family Medicine

## 2024-01-05 ENCOUNTER — Ambulatory Visit (HOSPITAL_COMMUNITY)
Admission: RE | Admit: 2024-01-05 | Discharge: 2024-01-05 | Disposition: A | Discharge status: Home / Self Care | Payer: Medicare HMO | Source: Ambulatory Visit | Attending: Family Medicine | Admitting: Family Medicine

## 2024-01-05 ENCOUNTER — Ambulatory Visit (HOSPITAL_COMMUNITY)
Admission: RE | Admit: 2024-01-05 | Discharge: 2024-01-05 | Disposition: A | Discharge status: Home / Self Care | Payer: Medicare HMO | Source: Ambulatory Visit | Attending: Gastroenterology

## 2024-01-05 DIAGNOSIS — R09A2 Foreign body sensation, throat: Secondary | ICD-10-CM

## 2024-01-05 DIAGNOSIS — R1310 Dysphagia, unspecified: Secondary | ICD-10-CM

## 2024-01-05 DIAGNOSIS — R058 Other specified cough: Secondary | ICD-10-CM | POA: Insufficient documentation

## 2024-01-05 DIAGNOSIS — K2100 Gastro-esophageal reflux disease with esophagitis, without bleeding: Secondary | ICD-10-CM | POA: Diagnosis not present

## 2024-01-05 DIAGNOSIS — K224 Dyskinesia of esophagus: Secondary | ICD-10-CM | POA: Insufficient documentation

## 2024-01-05 DIAGNOSIS — R059 Cough, unspecified: Secondary | ICD-10-CM

## 2024-01-05 NOTE — Progress Notes (Signed)
 LMTCB

## 2024-01-05 NOTE — Evaluation (Signed)
 Modified Barium Swallow Study  Patient Details  Name: Rita Levine MRN: [id number] Date of Birth: 1951-05-16  Today's Date: 01/05/2024  Modified Barium Swallow completed.  Full report located under Chart Review in the Imaging Section.  History of Present Illness 73 yo presenting for OP MBS due to dysphagia. 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 She adds though that she saw ENT recently and was told that her vocal folds looked normal. Esophagram in 2023 showed atypical variation of dysmotility and manometry was recommended. She ended up having an EGD that revealed candidiasis and small hiatal hernia. This was treated and pt had subjective improvements so she deferred manometry. PMH includes: dysphagia, GERD, PNA, thyroid nodule, multiple food allergies, upper airway cough syndrome   Clinical Impression Pt's oropharyngeal swallow is WFL. She sometimes initiates her swallow at the pyriform sinuses and there is trace, transient penetration that occurs as a result, but her laryngeal closure is complete and she is consistently able to fully clear her laryngeal vestibule (PAS 2, considered to be normal). Recommend that she continue with regular solids and thin liquids as tolerated with use of esophageal precautions.  Factors that may increase risk of adverse event in presence of aspiration Rubye Oaks & Clearance Coots 2021): Respiratory or GI disease  Swallow Evaluation Recommendations Recommendations: PO diet PO Diet Recommendation: Regular;Thin liquids (Level 0) Liquid Administration via: Cup;Straw Medication Administration: Whole meds with liquid Supervision: Patient able to self-feed Swallowing strategies  : Slow rate;Small bites/sips;Follow solids with liquids Postural changes: Position pt fully upright for meals;Stay upright 30-60 min after meals Oral care recommendations: Oral care BID (2x/day) Recommended  consults: Other(comment) (continue f/u with GI)      Mahala Menghini., M.A. CCC-SLP Acute Rehabilitation Services Office [phone]  Secure chat preferred  01/05/2024,1:21 PM

## 2024-01-06 DIAGNOSIS — Z01812 Encounter for preprocedural laboratory examination: Secondary | ICD-10-CM | POA: Diagnosis not present

## 2024-01-06 DIAGNOSIS — I10 Essential (primary) hypertension: Secondary | ICD-10-CM | POA: Diagnosis not present

## 2024-01-06 LAB — BASIC METABOLIC PANEL
BUN/Creatinine Ratio: 12 (ref 12–28)
BUN: 9 mg/dL (ref 8–27)
CO2: 29 mmol/L (ref 20–29)
Calcium: 9.9 mg/dL (ref 8.7–10.3)
Chloride: 99 mmol/L (ref 96–106)
Creatinine, Ser: 0.73 mg/dL (ref 0.57–1.00)
Glucose: 115 mg/dL — ABNORMAL HIGH (ref 70–99)
Potassium: 4.2 mmol/L (ref 3.5–5.2)
Sodium: 141 mmol/L (ref 134–144)
eGFR: 87 mL/min/{1.73_m2} (ref >59)

## 2024-01-07 ENCOUNTER — Other Ambulatory Visit: Payer: Self-pay | Admitting: Nurse Practitioner

## 2024-01-07 DIAGNOSIS — J45909 Unspecified asthma, uncomplicated: Secondary | ICD-10-CM

## 2024-01-07 DIAGNOSIS — H353131 Nonexudative age-related macular degeneration, bilateral, early dry stage: Secondary | ICD-10-CM | POA: Diagnosis not present

## 2024-01-07 DIAGNOSIS — H52223 Regular astigmatism, bilateral: Secondary | ICD-10-CM | POA: Diagnosis not present

## 2024-01-07 DIAGNOSIS — H25813 Combined forms of age-related cataract, bilateral: Secondary | ICD-10-CM | POA: Diagnosis not present

## 2024-01-07 DIAGNOSIS — D3132 Benign neoplasm of left choroid: Secondary | ICD-10-CM | POA: Diagnosis not present

## 2024-01-09 DIAGNOSIS — M546 Pain in thoracic spine: Secondary | ICD-10-CM | POA: Diagnosis not present

## 2024-01-11 ENCOUNTER — Encounter (HOSPITAL_BASED_OUTPATIENT_CLINIC_OR_DEPARTMENT_OTHER): Payer: Self-pay | Admitting: Cardiovascular Disease

## 2024-01-12 ENCOUNTER — Encounter (HOSPITAL_COMMUNITY): Payer: Self-pay

## 2024-01-13 ENCOUNTER — Telehealth (HOSPITAL_COMMUNITY): Payer: Self-pay | Admitting: *Deleted

## 2024-01-13 NOTE — Telephone Encounter (Signed)
Reaching out to patient to offer assistance regarding upcoming cardiac imaging study; pt verbalizes understanding of appt date/time, parking situation and where to check in, pre-test NPO status and medications ordered, and verified current allergies; name and call back number provided for further questions should they arise  Larey Brick RN Navigator Cardiac Imaging [facility] [phone] office [phone] cell  Patient to take 50mg  metoprolol tartrate two hours prior to her cardiac CT scan. She is aware to arrive at 10:30 AM.

## 2024-01-14 ENCOUNTER — Ambulatory Visit (HOSPITAL_COMMUNITY)
Admission: RE | Admit: 2024-01-14 | Discharge: 2024-01-14 | Disposition: A | Discharge status: Home / Self Care | Source: Ambulatory Visit | Attending: Cardiovascular Disease | Admitting: Cardiovascular Disease

## 2024-01-14 ENCOUNTER — Other Ambulatory Visit: Payer: Self-pay | Admitting: Cardiovascular Disease

## 2024-01-14 DIAGNOSIS — I2584 Coronary atherosclerosis due to calcified coronary lesion: Secondary | ICD-10-CM | POA: Insufficient documentation

## 2024-01-14 DIAGNOSIS — R0602 Shortness of breath: Secondary | ICD-10-CM | POA: Insufficient documentation

## 2024-01-14 DIAGNOSIS — R0789 Other chest pain: Secondary | ICD-10-CM | POA: Diagnosis present

## 2024-01-14 DIAGNOSIS — R931 Abnormal findings on diagnostic imaging of heart and coronary circulation: Secondary | ICD-10-CM | POA: Diagnosis not present

## 2024-01-14 DIAGNOSIS — I700 Atherosclerosis of aorta: Secondary | ICD-10-CM | POA: Diagnosis not present

## 2024-01-14 DIAGNOSIS — R079 Chest pain, unspecified: Secondary | ICD-10-CM | POA: Diagnosis not present

## 2024-01-14 DIAGNOSIS — I2510 Atherosclerotic heart disease of native coronary artery without angina pectoris: Secondary | ICD-10-CM | POA: Diagnosis not present

## 2024-01-14 MED ORDER — IOHEXOL 350 MG/ML SOLN
95.0000 mL | Freq: Once | INTRAVENOUS | Status: AC | PRN
  Administered 2024-01-14: 95 mL via INTRAVENOUS

## 2024-01-14 MED ORDER — NITROGLYCERIN 0.4 MG SL SUBL
0.8000 mg | SUBLINGUAL_TABLET | Freq: Once | SUBLINGUAL | Status: AC
  Administered 2024-01-14: 0.8 mg via SUBLINGUAL

## 2024-01-14 MED ORDER — NITROGLYCERIN 0.4 MG SL SUBL
SUBLINGUAL_TABLET | SUBLINGUAL | Status: AC
  Filled 2024-01-14: qty 2

## 2024-01-14 NOTE — Progress Notes (Signed)
 Patient tolerated CT well. Vital signs stable encourage to drink water throughout day.Reasons explained and verbalized understanding. Ambulated steady gait.

## 2024-01-15 ENCOUNTER — Encounter: Payer: Self-pay | Admitting: Gastroenterology

## 2024-01-15 DIAGNOSIS — M5459 Other low back pain: Secondary | ICD-10-CM | POA: Diagnosis not present

## 2024-01-15 DIAGNOSIS — M546 Pain in thoracic spine: Secondary | ICD-10-CM | POA: Diagnosis not present

## 2024-02-09 ENCOUNTER — Encounter: Payer: Self-pay | Admitting: Family

## 2024-02-11 ENCOUNTER — Telehealth: Payer: Self-pay | Admitting: Cardiovascular Disease

## 2024-02-11 DIAGNOSIS — R079 Chest pain, unspecified: Secondary | ICD-10-CM

## 2024-02-11 NOTE — Telephone Encounter (Signed)
-----   Message from [facility] sent at 02/11/2024  4:06 PM EDT ----- Cardiac CT shows mostly mild disease.  However, there is one region that has some artifact but we cannot rule out that there is more significant disease in this area.  I recommend that we get a stress test to make sure that this is artifact and not more significant disease.  Please schedule for Lexiscan Myoview.

## 2024-02-11 NOTE — Telephone Encounter (Signed)
 Called to resch appt, she stated she has not heard back from anyone about her CT scoring test.  She would like to know her results   Best number [phone]

## 2024-02-11 NOTE — Telephone Encounter (Signed)
Advised patient, verbalized understanding  Message sent to scheduling to arrange.

## 2024-02-12 NOTE — Progress Notes (Signed)
Scheduled 5/12 

## 2024-02-15 ENCOUNTER — Other Ambulatory Visit: Payer: Self-pay | Admitting: Cardiology

## 2024-02-17 ENCOUNTER — Encounter (HOSPITAL_COMMUNITY): Payer: Self-pay

## 2024-02-18 ENCOUNTER — Encounter: Payer: Self-pay | Admitting: Gastroenterology

## 2024-02-23 ENCOUNTER — Encounter (HOSPITAL_COMMUNITY)

## 2024-02-23 ENCOUNTER — Other Ambulatory Visit: Payer: Self-pay | Admitting: Cardiovascular Disease

## 2024-02-23 DIAGNOSIS — R079 Chest pain, unspecified: Secondary | ICD-10-CM

## 2024-02-23 DIAGNOSIS — H25813 Combined forms of age-related cataract, bilateral: Secondary | ICD-10-CM | POA: Diagnosis not present

## 2024-02-25 ENCOUNTER — Ambulatory Visit (HOSPITAL_COMMUNITY): Attending: Cardiology

## 2024-02-25 ENCOUNTER — Ambulatory Visit (HOSPITAL_BASED_OUTPATIENT_CLINIC_OR_DEPARTMENT_OTHER): Payer: Self-pay | Admitting: Cardiovascular Disease

## 2024-02-25 DIAGNOSIS — R079 Chest pain, unspecified: Secondary | ICD-10-CM | POA: Diagnosis not present

## 2024-02-25 LAB — MYOCARDIAL PERFUSION IMAGING
LV dias vol: 43 mL (ref 46–106)
LV sys vol: 7 mL
Nuc Stress EF: 84 %
Peak HR: 114 {beats}/min
Rest HR: 65 {beats}/min
Rest Nuclear Isotope Dose: 10.8 mCi
SDS: 0
SRS: 2
SSS: 0
ST Depression (mm): 0 mm
Stress Nuclear Isotope Dose: 31.5 mCi

## 2024-02-25 MED ORDER — TECHNETIUM TC 99M TETROFOSMIN IV KIT
10.8000 | PACK | Freq: Once | INTRAVENOUS | Status: AC | PRN
  Administered 2024-02-25: 10.8 via INTRAVENOUS

## 2024-02-25 MED ORDER — REGADENOSON 0.4 MG/5ML IV SOLN
INTRAVENOUS | Status: AC
  Filled 2024-02-25: qty 5

## 2024-02-25 MED ORDER — TECHNETIUM TC 99M TETROFOSMIN IV KIT
31.5000 | PACK | Freq: Once | INTRAVENOUS | Status: AC | PRN
  Administered 2024-02-25: 31.5 via INTRAVENOUS

## 2024-02-25 MED ORDER — REGADENOSON 0.4 MG/5ML IV SOLN
0.4000 mg | Freq: Once | INTRAVENOUS | Status: AC
  Administered 2024-02-25: 0.4 mg via INTRAVENOUS

## 2024-03-04 DIAGNOSIS — J45909 Unspecified asthma, uncomplicated: Secondary | ICD-10-CM | POA: Diagnosis not present

## 2024-03-04 DIAGNOSIS — H25811 Combined forms of age-related cataract, right eye: Secondary | ICD-10-CM | POA: Insufficient documentation

## 2024-03-04 DIAGNOSIS — H52221 Regular astigmatism, right eye: Secondary | ICD-10-CM | POA: Insufficient documentation

## 2024-03-04 DIAGNOSIS — I10 Essential (primary) hypertension: Secondary | ICD-10-CM | POA: Diagnosis not present

## 2024-03-09 ENCOUNTER — Ambulatory Visit: Payer: Self-pay | Admitting: Cardiovascular Disease

## 2024-03-18 ENCOUNTER — Other Ambulatory Visit (INDEPENDENT_AMBULATORY_CARE_PROVIDER_SITE_OTHER)

## 2024-03-18 ENCOUNTER — Ambulatory Visit (INDEPENDENT_AMBULATORY_CARE_PROVIDER_SITE_OTHER): Admitting: Sports Medicine

## 2024-03-18 DIAGNOSIS — M65312 Trigger thumb, left thumb: Secondary | ICD-10-CM | POA: Diagnosis not present

## 2024-03-18 DIAGNOSIS — M65342 Trigger finger, left ring finger: Secondary | ICD-10-CM | POA: Diagnosis not present

## 2024-03-18 DIAGNOSIS — M65321 Trigger finger, right index finger: Secondary | ICD-10-CM | POA: Diagnosis not present

## 2024-03-18 DIAGNOSIS — M65311 Trigger thumb, right thumb: Secondary | ICD-10-CM

## 2024-03-18 DIAGNOSIS — M65331 Trigger finger, right middle finger: Secondary | ICD-10-CM

## 2024-03-18 DIAGNOSIS — M65322 Trigger finger, left index finger: Secondary | ICD-10-CM

## 2024-03-18 DIAGNOSIS — M65351 Trigger finger, right little finger: Secondary | ICD-10-CM

## 2024-03-18 DIAGNOSIS — M65352 Trigger finger, left little finger: Secondary | ICD-10-CM

## 2024-03-18 DIAGNOSIS — M65332 Trigger finger, left middle finger: Secondary | ICD-10-CM

## 2024-03-18 DIAGNOSIS — M65341 Trigger finger, right ring finger: Secondary | ICD-10-CM

## 2024-03-18 DIAGNOSIS — M2550 Pain in unspecified joint: Secondary | ICD-10-CM | POA: Diagnosis not present

## 2024-03-18 MED ORDER — HYDROXYCHLOROQUINE SULFATE 200 MG PO TABS: ORAL_TABLET | ORAL | 3 refills | Status: AC

## 2024-03-18 MED ORDER — TRIAMCINOLONE ACETONIDE 40 MG/ML IJ SUSP
20.0000 mg | Freq: Once | INTRAMUSCULAR | Status: AC
  Administered 2024-03-18: 20 mg via INTRAMUSCULAR

## 2024-03-18 NOTE — Progress Notes (Addendum)
    Procedures performed today:    Procedure: Real-time Ultrasound Guided injection of the left ring finger flexor tendon sheath Device: Samsung HS60  Verbal informed consent obtained.  Time-out conducted.  Noted no overlying erythema, induration, or other signs of local infection.  Skin prepped in a sterile fashion.  Local anesthesia: Topical Ethyl chloride.  With sterile technique and under real time ultrasound guidance: Noted mild synovitis, 0.5 cc lidocaine , 0.5 cc kenalog  40 injected easily. Completed without difficulty  Advised to call if fevers/chills, erythema, induration, drainage, or persistent bleeding.  Images permanently stored and available for review in PACS.  Impression: Technically successful ultrasound guided injection.  Independent interpretation of notes and tests performed by another provider:   None.  Brief History, Exam, Impression, and Recommendations:    Trigger finger of all digits of both hands Chronic stenosing tenosynovitis of all digits, at this time left ring finger, injected, return as needed.  Polyarthralgia Rita Levine does have widespread arthralgias, she has tried multiple medications, she has multiple intolerances, she has had a rheumatoid workup that was negative for seropositive rheumatoid arthritis. She is wondering if hydroxychloroquine or ivermectin would be an option. I explained that I only prescribe these medications for evidence-based indications, and that ivermectin was not antiparasitic and we would not be using it, however hydroxychloroquine does have some evidence for rheumatoid arthritis, though she does not have seropositive rheumatoid arthritis some of her symptoms can be associated to seronegative rheumatoid arthritis so we will add hydroxychloroquine 200 mg twice a day for a week then 200 mg daily, we can always go up to 400 mg daily if need be, she understands she needs optometry monitoring, she will work with her eye doctor for  this.    ____________________________________________ Joselyn Nicely. Sandy Crumb, M.D., ABFM., CAQSM., AME. Primary Care and Sports Medicine [facility]  Adjunct Professor of [facility] Medicine  [employer]  Restaurant manager, fast food

## 2024-03-18 NOTE — Assessment & Plan Note (Signed)
 Rita Levine does have widespread arthralgias, she has tried multiple medications, she has multiple intolerances, she has had a rheumatoid workup that was negative for seropositive rheumatoid arthritis. She is wondering if hydroxychloroquine or ivermectin would be an option. I explained that I only prescribe these medications for evidence-based indications, and that ivermectin was not antiparasitic and we would not be using it, however hydroxychloroquine does have some evidence for rheumatoid arthritis, though she does not have seropositive rheumatoid arthritis some of her symptoms can be associated to seronegative rheumatoid arthritis so we will add hydroxychloroquine 200 mg twice a day for a week then 200 mg daily, we can always go up to 400 mg daily if need be, she understands she needs optometry monitoring, she will work with her eye doctor for this.

## 2024-03-18 NOTE — Addendum Note (Signed)
 Addended by: OLIVA-AVELLANEDA, Evian Derringer L on: 03/18/2024 11:23 AM   Modules accepted: Orders

## 2024-03-18 NOTE — Addendum Note (Signed)
 Addended by: Gean Keels on: 03/18/2024 11:26 AM   Modules accepted: Orders

## 2024-03-18 NOTE — Assessment & Plan Note (Signed)
 Chronic stenosing tenosynovitis of all digits, at this time left ring finger, injected, return as needed.

## 2024-03-25 ENCOUNTER — Encounter: Payer: Self-pay | Admitting: Gastroenterology

## 2024-03-25 ENCOUNTER — Ambulatory Visit: Admitting: Gastroenterology

## 2024-03-25 VITALS — BP 118/62 | HR 66 | Ht 59.0 in | Wt 121.0 lb

## 2024-03-25 DIAGNOSIS — K580 Irritable bowel syndrome with diarrhea: Secondary | ICD-10-CM | POA: Diagnosis not present

## 2024-03-25 DIAGNOSIS — R09A2 Foreign body sensation, throat: Secondary | ICD-10-CM | POA: Diagnosis not present

## 2024-03-25 DIAGNOSIS — K224 Dyskinesia of esophagus: Secondary | ICD-10-CM | POA: Diagnosis not present

## 2024-03-25 DIAGNOSIS — I10 Essential (primary) hypertension: Secondary | ICD-10-CM | POA: Diagnosis not present

## 2024-03-25 DIAGNOSIS — I2510 Atherosclerotic heart disease of native coronary artery without angina pectoris: Secondary | ICD-10-CM | POA: Diagnosis not present

## 2024-03-25 DIAGNOSIS — R1310 Dysphagia, unspecified: Secondary | ICD-10-CM | POA: Diagnosis not present

## 2024-03-25 DIAGNOSIS — I700 Atherosclerosis of aorta: Secondary | ICD-10-CM | POA: Diagnosis not present

## 2024-03-25 DIAGNOSIS — R140 Abdominal distension (gaseous): Secondary | ICD-10-CM | POA: Diagnosis not present

## 2024-03-25 DIAGNOSIS — Z5181 Encounter for therapeutic drug level monitoring: Secondary | ICD-10-CM | POA: Diagnosis not present

## 2024-03-25 DIAGNOSIS — I2584 Coronary atherosclerosis due to calcified coronary lesion: Secondary | ICD-10-CM | POA: Diagnosis not present

## 2024-03-25 HISTORY — DX: Abdominal distension (gaseous): R14.0

## 2024-03-25 HISTORY — DX: Foreign body sensation, throat: R09.A2

## 2024-03-25 NOTE — Progress Notes (Signed)
 03/25/2024 JENNY LAI [id number] 06/12/1951   HISTORY OF PRESENT ILLNESS: This is a 73 year old female who is a patient of Dr. Allean Aran.  She has history of GERD, persistent complaints of dysphagia and hoarseness and globus sensation.  Barium esophagram previously showed motility disorder of the esophagus/presbyesophagus.  She is requesting a referral to [facility] to motility clinic to see if there is anything they can do about this.  She says that she does not just want to choke to death one day and does not believe that there is nothing that you can do to help this issue.  She was on pantoprazole  40 mg twice daily, but they chose to try Voquezna .  She says she got it for a couple months but it was $400 a month so chose not to take it ongoingly.  She also has IBS.  She complains of a lot of gas and abdominal bloating on a regular basis.  Has occasional diarrhea.  Tried IBgard, but she said that made the gas worse and almost caused her to have some loose stools.  EGD November 24, 2023 - Z- line regular, 36 cm from the incisors. - Normal esophagus. - 2 cm hiatal hernia. - A single papule ( nodule) found in the stomach. Biopsied. - Gastritis. Biopsied. - Normal examined duodenum. Colonoscopy November 24, 2023 - Four 3 to 7 mm polyps in the transverse colon, in the ascending colon and in the cecum, removed with a cold snare. Resected and retrieved. - Severe diverticulosis in the sigmoid colon, in the descending colon, in the transverse colon and in the ascending colon. - Non- bleeding external and internal hemorrhoids. 1. Surgical [P], gastric antrum nodule bx :      FUNDIC-TYPE GASTRIC MUCOSA WITH CHANGES CONSISTENT WITH A FUNDIC GLAND POLYP      MINIMAL CHRONIC GASTRITIS      NEGATIVE FOR H. PYLORI, INTESTINAL METAPLASIA, DYSPLASIA AND CARCINOMA       2. Surgical [P], gastric antrum and gastric body bx :      REACTIVE GASTROPATHY      BENIGN FUNDIC GLAND POLYP      NEGATIVE FOR H. PYLORI,  INTESTINAL METAPLASIA, DYSPLASIA CARCINOMA       3. Surgical [P], colon, cecum polyp x 2, ascending x 1, transverse x 1, polyp (4) :      TUBULAR ADENOMA, 3 FRAGMENTS      NEGATIVE FOR HIGH-GRADE DYSPLASIA CARCINOMA    02/05/2022 EGD with white esophageal plaques found consistent with candidiasis and small hiatal hernia. Patient started on Diflucan .  Barium esophagram 2023 suggested motility disorder of esophagus, no strictures or stenosis.   EGD 2020 for dysphagia was normal.  08/05/2016 colonoscopy with diverticulosis and hemorrhoids and otherwise normal.  Repeat recommended 10 years      Past Medical History:  Diagnosis Date   Allergic rhinitis    Allergic rhinitis due to pollen 12/03/2019   Allergy    Anemia    Asthma    Cataract    Chest pain, unspecified 04/07/2014   Chicken pox    Colon polyp    Diverticulosis    Dysphagia    Dysrhythmia    Palpitations   Fibromyalgia    Food allergy    Gallstone    GERD (gastroesophageal reflux disease)    Hiatal hernia    Hypercholesteremia    Hypertension    Hypothyroidism    IBS (irritable bowel syndrome)    Ingrown toenail 08/23/2020   Migraine headache  occasionally   Morton neuroma, 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   Osteoarthritis    Osteopenia    Pneumonia    PONV (postoperative nausea and vomiting)    Pulmonary hypertension (HCC)    pt denies, pt states she was tested for it but was not diagnosed with it   Rectal bleeding    SBO (small bowel obstruction) (HCC) 10/20/2017   Seasonal allergies    Seborrheic dermatitis of scalp 06/02/2020   Subconjunctival hemorrhage of left eye 08/01/2022   Thyroid  nodule    Trigger finger of left and right ring fingers 01/11/2021   Past Surgical History:  Procedure Laterality Date   APPENDECTOMY  1982   BREAST EXCISIONAL BIOPSY Right 1995   benign   BREAST LUMPECTOMY  1991   CARPAL TUNNEL RELEASE Right 2006   Right wrist   CATHETER REMOVAL     CESAREAN SECTION     x 2    CHOLECYSTECTOMY N/A 07/25/2020   Procedure: LAPAROSCOPIC CHOLECYSTECTOMY;  Surgeon: Lockie Rima, MD;  Location: [facility] OR;  Service: General;  Laterality: N/A;   COLON RESECTION  1990   COLONOSCOPY     CT ABDOMEN PELVIS W ([facility] HX)  01/03/2020   IMAGE MRI brain:  04/2012   No focal IAC or inner ear lesion to explain hearing loss. Slightly greater than expected number of subcortical T2- hyperintensities bilaterally.  These are nonspecific.  They can be seen in the setting of chronic migraine headaches, demyelinating process, chronic microvascular ischemic, prior infection or inflammation, or vasculitis   IMAGE MRI lumbar:  05/01/2006   Mild central canal stenosis with facet arthropathy and mildly bulging disc at L4-5.  There is mild narrowing in the left lateral recess at this level.  No definite neural impingement.  Appearance at this level has not markedly Moderately severe facet arthropathy at L5-S1 with mild interval progression of a diffuse broad based disc bulge.  There is mild biforaminal narrowing.  Central canal is open   LAPAROSCOPIC ABDOMINAL EXPLORATION  2019   adhesion removal> caused bowel blockage    NASAL SEPTUM SURGERY  2004   TONSILLECTOMY  1965   TOTAL ABDOMINAL HYSTERECTOMY W/ BILATERAL SALPINGOOPHORECTOMY  1988   TUBAL LIGATION  1974   UPPER GASTROINTESTINAL ENDOSCOPY  2020    reports that she has never smoked. She has been exposed to tobacco smoke. She has never used smokeless tobacco. She reports current alcohol use. She reports that she does not use drugs. family history includes Alcohol abuse in her brother and sister; Arthritis in her brother, brother, father, and mother; Asthma in her father; Brain cancer in her sister; Breast cancer in her cousin and maternal aunt; COPD in her brother, father, sister, sister, and sister; Cancer in her sister; Clotting disorder in her sister; Depression in her brother; Diabetes in her brother and father; Diabetes type II in her sister; Early  death in her father, sister, and sister; Emphysema in her maternal aunt and maternal uncle; Hearing loss in her mother; Heart disease in her mother and sister; Hypercholesterolemia in her brother; Hyperlipidemia in her father and mother; Hypertension in her father, mother, and sister; Kidney disease in her mother; Lung cancer in her sister and another family member; Macular degeneration in her mother; Parkinson's disease in her father; Throat cancer in her brother; Thyroid  cancer in her sister. Allergies  Allergen Reactions   Paxlovid  [Nirmatrelvir -Ritonavir ] Shortness Of Breath and Palpitations   Tetanus Toxoid Swelling    Reports a fever and headache with swelling.  Avelox [Moxifloxacin Hcl In Nacl] Hives    Racing heart   Cephalexin Hives   Codeine Hives    Heart racing   Cymbalta  [Duloxetine  Hcl] Other (See Comments)    Increased tremors   Erythromycin Hives   Propranolol Hives   Sulfa Antibiotics Hives   Sulfamethoxazole-Trimethoprim Hives   Tramadol Hives   Flagyl [Metronidazole] Other (See Comments)    Unknown reaction, unknown severity   Statins Other (See Comments)    Myalgia       Outpatient Encounter Medications as of 03/25/2024  Medication Sig   albuterol  (PROAIR  HFA) 108 (90 Base) MCG/ACT inhaler Inhale 2 puffs into the lungs every 6 (six) hours as needed.   albuterol  (PROVENTIL ) (2.5 MG/3ML) 0.083% nebulizer solution Take 3 mLs (2.5 mg total) by nebulization every 6 (six) hours as needed.   ARMOUR THYROID  15 MG tablet Take 1 tablet by mouth in the AM Once a day   ARMOUR THYROID  30 MG tablet Take 1 tablet by mouth in the AM once a day   atenolol  (TENORMIN ) 25 MG tablet Take 0.5-1 tablets (12.5-25 mg total) by mouth daily.   Evolocumab  (REPATHA  SURECLICK) 140 MG/ML SOAJ Inject 140 mg into the skin every 14 (fourteen) days.   ezetimibe  (ZETIA ) 10 MG tablet TAKE 1 TABLET BY MOUTH EVERY DAY   famotidine  (PEPCID ) 40 MG tablet Take 1 tablet (40 mg total) by mouth 2 (two)  times daily.   fexofenadine  (ALLERGY RELIEF) 180 MG tablet Take 1 tablet (180 mg total) by mouth daily.   fluticasone  (FLONASE ) 50 MCG/ACT nasal spray Place 2 sprays into both nostrils daily.   hydrochlorothiazide  (MICROZIDE ) 12.5 MG capsule Take 1 capsule (12.5 mg total) by mouth daily as needed.   hydroxychloroquine  (PLAQUENIL ) 200 MG tablet 1 tablet p.o. twice a day for a week then 1 tab p.o. daily   hyoscyamine  (LEVSIN) 0.125 MG tablet Take 1 tablet (0.125 mg total) by mouth every 6 (six) hours as needed.   LORazepam  (ATIVAN ) 0.5 MG tablet Take 1 tablet (0.5 mg total) by mouth 2 (two) times daily as needed for anxiety.   mometasone (ELOCON) 0.1 % lotion Apply 1 application topically daily as needed (ear irritation).    montelukast  (SINGULAIR ) 10 MG tablet TAKE 1 TABLET BY MOUTH EVERYDAY AT BEDTIME   ondansetron  (ZOFRAN -ODT) 8 MG disintegrating tablet TAKE 1 TABLET BY MOUTH EVERY 8 HOURS AS NEEDED FOR NAUSEA OR VOMITING.   Probiotic CAPS Take 1 capsule by mouth daily.   tiZANidine  (ZANAFLEX ) 4 MG tablet Take 1 tablet (4 mg total) by mouth every 6 (six) hours as needed for muscle spasms.   WIXELA INHUB 250-50 MCG/ACT AEPB INHALE 1 PUFF INTO THE LUNGS IN THE MORNING AND AT BEDTIME.   zinc gluconate 50 MG tablet Take 100 mg by mouth daily.   zolpidem  (AMBIEN ) 5 MG tablet Take 1 tablet (5 mg total) by mouth at bedtime. As needed   Ascorbic Acid (VITAMIN C) 1000 MG tablet Take 3,000 mg by mouth daily. (Patient not taking: Reported on 03/25/2024)   budesonide  (PULMICORT ) 0.5 MG/2ML nebulizer solution TAKE 2 MLS (0.5 MG TOTAL) BY NEBULIZATION IN THE MORNING AND AT BEDTIME.   Carboxymethylcellulose Sodium (THERATEARS OP) Place 1 drop into both eyes 2 (two) times daily. (Patient not taking: Reported on 03/25/2024)   clotrimazole  (MYCELEX ) 10 MG troche Take 1 tablet (10 mg total) by mouth 4 (four) times daily as needed.   Coenzyme Q10 (COQ-10) 100 MG CAPS Take 100 mg by mouth daily. (  Patient not taking:  Reported on 03/25/2024)   folic acid (FOLVITE) 1 MG tablet SMARTSIG:2 Tablet(s) By Mouth (Patient not taking: Reported on 03/25/2024)   metoprolol  tartrate (LOPRESSOR ) 50 MG tablet TAKE 1 TABLET 2 HOURS PRIOR TO CT   [DISCONTINUED] calcium  carbonate (TUMS - DOSED IN MG ELEMENTAL CALCIUM ) 500 MG chewable tablet Chew 3 tablets by mouth daily as needed for indigestion or heartburn.   [DISCONTINUED] Cholecalciferol (VITAMIN D ) 125 MCG (5000 UT) CAPS Take 5,000 Units by mouth daily.   [DISCONTINUED] fluconazole  (DIFLUCAN ) 150 MG tablet Take 1 tablet (150 mg total) by mouth daily.   [DISCONTINUED] Peppermint Oil (IBGARD) 90 MG CPCR Take as directed   [DISCONTINUED] potassium chloride SA (KLOR-CON M) 20 MEQ tablet Take by mouth.   [DISCONTINUED] Vonoprazan Fumarate  (VOQUEZNA ) 10 MG TABS Take 20 mg by mouth daily before supper.   [DISCONTINUED] Vonoprazan Fumarate  (VOQUEZNA ) 20 MG TABS Take 20 mg by mouth daily before supper. Take for 2 months then decrease to 10mg  daily   No facility-administered encounter medications on file as of 03/25/2024.    REVIEW OF SYSTEMS  : All other systems reviewed and negative except where noted in the History of Present Illness.   PHYSICAL EXAM: BP 118/62   Pulse 66   Ht 4' 11 (1.499 m)   Wt 121 lb (54.9 kg)   BMI 24.44 kg/m  General: Well developed white female in no acute distress Head: Normocephalic and atraumatic Eyes:  Sclerae anicteric, conjunctiva pink. Ears: Normal auditory acuity Lungs: Clear throughout to auscultation; no W/R/R. Heart: Regular rate and rhythm; no M/R/G. Abdomen:  Soft, non-distended.  Mild diffuse TTP.  BS present. Musculoskeletal: Symmetrical with no gross deformities  Skin: No lesions on visible extremities Extremities: No edema  Neurological: Alert oriented x 4, grossly non-focal Psychological:  Alert and cooperative. Normal mood and affect  ASSESSMENT AND PLAN: *Dysphagia/globus sensation/esophageal  dysmotility/presbyesophagus: Patient would like referral to [facility] motility clinic to see if there is anything to be done about these issues.  Will place referral.  She will restart her pantoprazole  40 mg twice daily since she could not afford the Voquezna  ongoing. *IBS-D:  intermittent diarrhea.  Has a lot of bloating and gas as well.  Will check SIBO breath test.  If positive will treat.  Even if negative could consider trial of Xifaxan if we can get it approved for the IBS-D.   CC:  Kuneff, Renee A, DO

## 2024-03-25 NOTE — Patient Instructions (Addendum)
 You have been given a testing kit to check for small intestine bacterial overgrowth (SIBO) which is completed by a company named [facility]. Make sure to return your test in the mail using the return mailing label given to you along with the kit. The test order, your demographic and insurance information have all already been sent to the company. [facility] will collect an upfront charge of $109.00 for commercial insurance plans and $229.00 if you are paying cash. The potential remaining total after claim submission and review is $120.00. Make sure to discuss with [facility] PRIOR to having the test to see if they have gotten information from your insurance company as to how much your testing will cost out of pocket, if any. Please contact [facility] at phone number [phone] to get instructions regarding how to perform the test as our office is unable to give specific testing instructions.   _______________________________________________________  If your blood pressure at your visit was 140/90 or greater, please contact your primary care physician to follow up on this.  _______________________________________________________  If you are age 12 or older, your body mass index should be between 23-30. Your Body mass index is 24.44 kg/m. If this is out of the aforementioned range listed, please consider follow up with your Primary Care Provider.  If you are age 49 or younger, your body mass index should be between 19-25. Your Body mass index is 24.44 kg/m. If this is out of the aformentioned range listed, please consider follow up with your Primary Care Provider.   ________________________________________________________  The [facility] providers would like to encourage you to use MYCHART to communicate with providers for non-urgent requests or questions.  Due to long hold times on the telephone, sending your provider a message by [facility] ([place] may be a faster and more efficient way  to get a response.  Please allow 48 business hours for a response.  Please remember that this is for non-urgent requests.  _______________________________________________________

## 2024-03-26 ENCOUNTER — Ambulatory Visit: Payer: Self-pay | Admitting: Cardiovascular Disease

## 2024-03-26 ENCOUNTER — Ambulatory Visit (HOSPITAL_BASED_OUTPATIENT_CLINIC_OR_DEPARTMENT_OTHER): Admitting: Family

## 2024-03-29 DIAGNOSIS — H59031 Cystoid macular edema following cataract surgery, right eye: Secondary | ICD-10-CM | POA: Diagnosis not present

## 2024-04-02 ENCOUNTER — Encounter (HOSPITAL_BASED_OUTPATIENT_CLINIC_OR_DEPARTMENT_OTHER): Payer: Self-pay | Admitting: Family

## 2024-04-02 ENCOUNTER — Ambulatory Visit (HOSPITAL_BASED_OUTPATIENT_CLINIC_OR_DEPARTMENT_OTHER): Admitting: Family

## 2024-04-02 VITALS — BP 124/70 | HR 82 | Ht 59.0 in | Wt 123.0 lb

## 2024-04-02 DIAGNOSIS — I2510 Atherosclerotic heart disease of native coronary artery without angina pectoris: Secondary | ICD-10-CM | POA: Diagnosis not present

## 2024-04-02 DIAGNOSIS — E785 Hyperlipidemia, unspecified: Secondary | ICD-10-CM

## 2024-04-02 DIAGNOSIS — I2584 Coronary atherosclerosis due to calcified coronary lesion: Secondary | ICD-10-CM

## 2024-04-02 DIAGNOSIS — I700 Atherosclerosis of aorta: Secondary | ICD-10-CM

## 2024-04-02 MED ORDER — REPATHA SURECLICK 140 MG/ML ~~LOC~~ SOAJ: 140.0000 mg | SUBCUTANEOUS | 3 refills | Status: AC

## 2024-04-02 NOTE — Progress Notes (Signed)
 Cardiology Office Note   Date:  04/02/2024  ID:  Shila, Kruczek 1951/03/14, MRN [id number] PCP: Mariel Shope, DO  [facility] Providers Cardiologist:  Avery Bodo, MD     History of Present Illness NEGIN HEGG is a 73 y.o. female with hx of nonobstructive CAD, HTN, HLD, aortic atherosclerosis, statin myopathy, fibromyalgia, varicose veins.   Prior patient of Dr. Jacquelynn Matter having since established with Dr. Theodis Fiscal. Chest CT 2021 with advanced atherosclerosis of aorta and iliacs. Monitor 2021 rare PAC.   At visit 12/16/23 noted chest tightness for 6 months in setting of increased stress including COVID pneumonia in August and the passing of her husband. Coronary CTA 01/14/24 with mild disease - one area not well visualized. Subsequent myoview  low risk study. Repatha  was initiated with repeat labs 03/25/24 LDL 45.  Presents today for follow up. Reports chest tightness has been less bothersome. Reassured by CCTA and myoview . Tolerating repatha  without issue. She is exercising 30 minutes per day and following a heart healthy diet. Does note some varicose veins which are minimally painful, prefers to defer referral to [facility] as this time.   ROS: Please see the history of present illness.    All other systems reviewed and are negative.   Studies Reviewed      Cardiac Studies & Procedures   ______________________________________________________________________________________________   STRESS TESTS  MYOCARDIAL PERFUSION IMAGING 02/25/2024  Narrative   Findings are consistent with no ischemia. The study is low risk.   No ST deviation was noted.   LV perfusion is equivocal. There is no evidence of ischemia. There is no evidence of infarction. Small, fixed, mild perfusion defect at apical septum at rest and improves with stress, not consistent with ischemia.   Left ventricular function is normal. Nuclear stress EF: 84%. The left ventricular ejection fraction is hyperdynamic  (>65%).   CT images were obtained for attenuation correction and were examined for the presence of coronary calcium  when appropriate.   Coronary calcium  was present on the attenuation correction CT images. Mild coronary calcifications were present. Coronary calcifications were present in the right coronary artery distribution(s).   Prior study not available for comparison.   ECHOCARDIOGRAM  ECHOCARDIOGRAM COMPLETE 08/08/2022  Narrative ECHOCARDIOGRAM REPORT    Patient Name:   DAMETRIA TUZZOLINO Date of Exam: 08/08/2022 Medical Rec #:  [id number]      Height:       59.0 in Accession #:    [id number]     Weight:       138.0 lb Date of Birth:  02/02/1951       BSA:          1.575 m Patient Age:    71 years       BP:           137/82 mmHg Patient Gender: F              HR:           63 bpm. Exam Location:  [address]  Procedure: 2D Echo, Cardiac Doppler, Color Doppler and Intracardiac Opacification Agent  Indications:    R06.00 Dyspnea  History:        Patient has prior history of Echocardiogram examinations, most recent 05/09/2004. Signs/Symptoms:Chest Pain; Risk Factors:Hypertension. Pulmonary hypertension.  Sonographer:    Cheri Coria Rodgers-Jones RDCS Referring Phys: [id number] JAYADEEP S VARANASI  IMPRESSIONS   1. Left ventricular ejection fraction, by estimation, is 60 to 65%. The left ventricle has normal function. The  left ventricle has no regional wall motion abnormalities. Left ventricular diastolic parameters are consistent with Grade I diastolic dysfunction (impaired relaxation). 2. Right ventricular systolic function is normal. The right ventricular size is normal. There is normal pulmonary artery systolic pressure. The estimated right ventricular systolic pressure is 23.8 mmHg. 3. The mitral valve is normal in structure. Trivial mitral valve regurgitation. No evidence of mitral stenosis. 4. The aortic valve is tricuspid. Aortic valve regurgitation is not visualized. No aortic  stenosis is present. 5. The inferior vena cava is normal in size with greater than 50% respiratory variability, suggesting right atrial pressure of 3 mmHg.  FINDINGS Left Ventricle: Left ventricular ejection fraction, by estimation, is 60 to 65%. The left ventricle has normal function. The left ventricle has no regional wall motion abnormalities. Definity  contrast agent was given IV to delineate the left ventricular endocardial borders. The left ventricular internal cavity size was normal in size. There is no left ventricular hypertrophy. Left ventricular diastolic parameters are consistent with Grade I diastolic dysfunction (impaired relaxation).  Right Ventricle: The right ventricular size is normal. No increase in right ventricular wall thickness. Right ventricular systolic function is normal. There is normal pulmonary artery systolic pressure. The tricuspid regurgitant velocity is 2.28 m/s, and with an assumed right atrial pressure of 3 mmHg, the estimated right ventricular systolic pressure is 23.8 mmHg.  Left Atrium: Left atrial size was normal in size.  Right Atrium: Right atrial size was normal in size.  Pericardium: There is no evidence of pericardial effusion.  Mitral Valve: The mitral valve is normal in structure. Trivial mitral valve regurgitation. No evidence of mitral valve stenosis.  Tricuspid Valve: The tricuspid valve is normal in structure. Tricuspid valve regurgitation is mild.  Aortic Valve: The aortic valve is tricuspid. Aortic valve regurgitation is not visualized. No aortic stenosis is present.  Pulmonic Valve: The pulmonic valve was normal in structure. Pulmonic valve regurgitation is trivial.  Aorta: The aortic root is normal in size and structure.  Venous: The inferior vena cava is normal in size with greater than 50% respiratory variability, suggesting right atrial pressure of 3 mmHg.  IAS/Shunts: No atrial level shunt detected by color flow Doppler.   LEFT  VENTRICLE PLAX 2D LVIDd:         3.90 cm   Diastology LVIDs:         2.10 cm   LV e' medial:    7.67 cm/s LV PW:         0.80 cm   LV E/e' medial:  7.8 LV IVS:        0.80 cm   LV e' lateral:   8.48 cm/s LVOT diam:     1.70 cm   LV E/e' lateral: 7.0 LV SV:         37 LV SV Index:   24 LVOT Area:     2.27 cm   RIGHT VENTRICLE             IVC RV Basal diam:  3.10 cm     IVC diam: 1.00 cm RV S prime:     13.90 cm/s TAPSE (M-mode): 1.6 cm  LEFT ATRIUM             Index        RIGHT ATRIUM          Index LA diam:        3.60 cm 2.29 cm/m   RA Area:     8.98 cm LA Vol (  A2C):   20.8 ml 13.20 ml/m  RA Volume:   18.60 ml 11.81 ml/m LA Vol (A4C):   27.4 ml 17.39 ml/m LA Biplane Vol: 24.1 ml 15.30 ml/m AORTIC VALVE LVOT Vmax:   74.23 cm/s LVOT Vmean:  49.767 cm/s LVOT VTI:    0.165 m  AORTA Ao Root diam: 3.00 cm Ao Asc diam:  3.30 cm  MITRAL VALVE               TRICUSPID VALVE MV Area (PHT): 2.74 cm    TR Peak grad:   20.8 mmHg MV Decel Time: 277 msec    TR Vmax:        228.00 cm/s MV E velocity: 59.50 cm/s MV A velocity: 95.90 cm/s  SHUNTS MV E/A ratio:  0.62        Systemic VTI:  0.16 m Systemic Diam: 1.70 cm  Dalton McleanMD Electronically signed by Archer Bear Signature Date/Time: 08/08/2022/4:32:21 PM    Final    MONITORS  LONG TERM MONITOR (3-14 DAYS) 09/27/2020  Narrative  Normal sinus rhythm.  Rare, briuef runs of PACs.  No sustained pathologic arrhythmias.   CT SCANS  CT CORONARY MORPH W/CTA COR W/SCORE 01/14/2024  Addendum 01/24/2024  7:53 PM ADDENDUM REPORT: 01/24/2024 19:50  EXAM: OVER-READ INTERPRETATION  CT CHEST  The following report is an over-read performed by radiologist Dr. Violet Grew [facility] on 01/24/2024. This over-read does not include interpretation of cardiac or coronary anatomy or pathology. The cardiac/coronary CT interpretation by the cardiologist is attached.  COMPARISON:  CT chest  06/03/2022  FINDINGS: Visualized portions of the lungs are clear.  No lymphadenopathy.  No acute upper abdominal abnormality.  No acute osseous abnormality.  No acute soft tissue abnormality.  IMPRESSION: No acute abnormality.   Electronically Signed By: Morgane  Naveau M.D. On: 01/24/2024 19:50  Narrative CLINICAL DATA:  25F with hypertension, hyperlipidemia, aortic atherosclerosis, and chest tightness.  EXAM: Cardiac/Coronary  CT  TECHNIQUE: The patient was scanned on a [employer].  FINDINGS: A 120 kV prospective scan was triggered in the descending thoracic aorta at 111 HU's. Axial non-contrast 3 mm slices were carried out through the heart. The data set was analyzed on a dedicated work station and scored using the Agatson method. Gantry rotation speed was 250 msecs and collimation was .6 mm. No beta blockade and 0.8 mg of sl NTG was given. The 3D data set was reconstructed in 5% intervals of the 67-82 % of the R-R cycle. Diastolic phases were analyzed on a dedicated work station using MPR, MIP and VRT modes. The patient received 80 cc of contrast.  Aorta: Normal size.  Aortic atherosclerosis.  No dissection.  Aortic Valve:  Trileaflet.  No calcifications.  Coronary Arteries:  Normal coronary origin.  Right dominance.  RCA is a large dominant artery that gives rise to PDA and PLVB. There is minimal (<25%) calcified plaque in the proximal and mid RCA.  Left main is a large artery that gives rise to LAD and LCX arteries.  LAD is a large vessel that has minimal (<25%) calcified plaque in the mid vessel. D1 is a small vessel without plaque. There is a region in the mid LAD that is concerning for severe (>70%) soft plaque. However, it could also be due to slab reconstruction artifact.  LCX is a non-dominant artery that gives rise to two OM branches there is no plaque.  Coronary Calcium  Score:  Left main: 0  Left anterior descending artery:  37.2  Left circumflex artery: 0  Right coronary artery: 26.8  Total: 64  Percentile: 61st  Other findings:  Normal pulmonary vein drainage into the left atrium.  Normal let atrial appendage without a thrombus.  Normal size of the pulmonary artery.  Non-cardiac: See separate report from [facility].  IMPRESSION: 1. Coronary calcium  score of 64. This was 61st percentile for age-, sex, and race-matched controls.  2. Total plaque volume 99 mm3 which is 27th percentile for age- and sex-matched controls (calcified plaque 15 mm3; non-calcified plaque 84 mm3). TPV is mild.  3. Normal coronary origin with right dominance.  4. There is minimal (<25%) plaque in the RCA. There is a mid LAD region with possible severe (>70%) stenosis. However, there is also slab reconstruction artifact in this region.  5.  Will send for FFRct.  6.  Aortic atherosclerosis.  RECOMMENDATIONS: 1. CAD-RADS 0: No evidence of CAD (0%). Consider non-atherosclerotic causes of chest pain.  2. CAD-RADS 1: Minimal non-obstructive CAD (0-24%). Consider non-atherosclerotic causes of chest pain. Consider preventive therapy and risk factor modification.  3. CAD-RADS 2: Mild non-obstructive CAD (25-49%). Consider non-atherosclerotic causes of chest pain. Consider preventive therapy and risk factor modification.  4. CAD-RADS 3: Moderate stenosis. Consider symptom-guided anti-ischemic pharmacotherapy as well as risk factor modification per guideline directed care. Additional analysis with CT FFR will be submitted.  5. CAD-RADS 4: Severe stenosis. (70-99% or > 50% left main). Cardiac catheterization or CT FFR is recommended. Consider symptom-guided anti-ischemic pharmacotherapy as well as risk factor modification per guideline directed care. Invasive coronary angiography recommended with revascularization per published guideline statements.  6. CAD-RADS 5: Total coronary occlusion (100%).  Consider cardiac catheterization or viability assessment. Consider symptom-guided anti-ischemic pharmacotherapy as well as risk factor modification per guideline directed care.  7. CAD-RADS N: Non-diagnostic study. Obstructive CAD can't be excluded. Alternative evaluation is recommended.  Maudine Sos, MD  Electronically Signed: By: Maudine Sos M.D. On: 01/14/2024 18:52     ______________________________________________________________________________________________       Risk Assessment/Calculations           Physical Exam VS:  BP 124/70   Pulse 82   Ht 4' 11 (1.499 m)   Wt 123 lb (55.8 kg)   SpO2 98%   BMI 24.84 kg/m        Wt Readings from Last 3 Encounters:  04/02/24 123 lb (55.8 kg)  03/25/24 121 lb (54.9 kg)  12/16/23 120 lb (54.4 kg)    GEN: Well nourished, well developed in no acute distress NECK: No JVD; No carotid bruits CARDIAC: RRR, no murmurs, rubs, gallops RESPIRATORY:  Clear to auscultation without rales, wheezing or rhonchi  ABDOMEN: Soft, non-tender, non-distended EXTREMITIES:  No edema; No deformity   ASSESSMENT AND PLAN  Nonobstructive CAD / Aortic atherosclerosis / Iliac artery atheroslerosis / HLD, LDl goal <70 - CCTA with nonobstructive disease. Myoview  (due to slab artifact vs ischemia) on CCTA low risk study with no ischemia. Chest pain has been overall quiescent. No claudication. Continue GDMT Repatha  140mg  q14 days. Stop Zetia  as LDL at goal <70 on Repatha . No statin due to prior statin myopathy. Recommend aiming for 150 minutes of moderate intensity activity per week and following a heart healthy diet.   Given information for [employer] to help with Repatha  cost  Palpitations - intermittent and overall not bothersome. Prior monitor rare PAC. Recommend staying well hydrated, limiting caffeine. She will contact us  if palpitations worsen. Continue present dose Atenolol , initially prescribed for migraines  which have resolved.  If persistent palpitations consider increased dose vs transition to Metoprolol .   HTN - BP well controlled. Continue current antihypertensive regimen.         Dispo: follow up in 6 months  Signed, Clearnce Curia, NP

## 2024-04-02 NOTE — Patient Instructions (Addendum)
 Medication Instructions:   STOP Zetia   *If you need a refill on your cardiac medications before your next appointment, please call your pharmacy*  Follow-Up: At [facility], you and your health needs are our priority.  As part of our continuing mission to provide you with exceptional heart care, our providers are all part of one team.  This team includes your primary Cardiologist (physician) and Advanced Practice Providers or APPs (Physician Assistants and Nurse Practitioners) who all work together to provide you with the care you need, when you need it.  Your next appointment:   6 month(s)  Provider:   Maudine Sos, MD, Slater Duncan, NP, or Neomi Banks, NP    We recommend signing up for the patient portal called MyChart.  Sign up information is provided on this After Visit Summary.  MyChart is used to connect with patients for Virtual Visits (Telemedicine).  Patients are able to view lab/test results, encounter notes, upcoming appointments, etc.  Non-urgent messages can be sent to your provider as well.   To learn more about what you can do with MyChart, go to [email or web address].   Other Instructions   [employer] [facility] to help with Repatha  cost:     [email or web address] Hit apply When it asks for diagnosis code you select Hypercholesterolemia Our fax number (if they need it) [phone]  To prevent palpitations: Make sure you are adequately hydrated.  Avoid and/or limit caffeine containing beverages like soda or tea. Exercise regularly.  Manage stress well. Some over the counter medications can cause palpitations such as Benadryl, AdvilPM, TylenolPM. Regular Advil  or Tylenol  do not cause palpitations.

## 2024-04-06 ENCOUNTER — Encounter: Payer: Self-pay | Admitting: Family Medicine

## 2024-04-06 NOTE — Telephone Encounter (Signed)
 Patient would need to ask Dr. Evonnie these questions.

## 2024-04-09 ENCOUNTER — Telehealth: Payer: Self-pay | Admitting: *Deleted

## 2024-04-09 NOTE — Telephone Encounter (Signed)
-----   Message from Fabens D. Zehr sent at 03/25/2024  3:59 PM EDT ----- My note is done.  Please send my note and Dr. Trenna last note as well is endoscopic studies, esophagram, etc. that she has had performed.  Thank you,  Jess ----- Message ----- From: Concha Almarie LABOR, LPN Sent: 3/87/7974  11:23 AM EDT To: Harlene BIRCH Zehr, PA-C  Let Powell know when your note is ready. ----- Message ----- From: Cayetano Harlene BIRCH, PA-C Sent: 03/25/2024  11:12 AM EDT To: [facility] Triage  Please place a referral to [facility] for dysphagia and presbyesophagus.  Thank you,  Jess

## 2024-04-09 NOTE — Telephone Encounter (Signed)
 Referral faxed to [facility]

## 2024-04-14 DIAGNOSIS — L738 Other specified follicular disorders: Secondary | ICD-10-CM | POA: Diagnosis not present

## 2024-04-14 DIAGNOSIS — Z85820 Personal history of malignant melanoma of skin: Secondary | ICD-10-CM | POA: Diagnosis not present

## 2024-04-14 DIAGNOSIS — L438 Other lichen planus: Secondary | ICD-10-CM | POA: Diagnosis not present

## 2024-04-14 DIAGNOSIS — Z85828 Personal history of other malignant neoplasm of skin: Secondary | ICD-10-CM | POA: Diagnosis not present

## 2024-04-14 DIAGNOSIS — D2371 Other benign neoplasm of skin of right lower limb, including hip: Secondary | ICD-10-CM | POA: Diagnosis not present

## 2024-04-14 DIAGNOSIS — L814 Other melanin hyperpigmentation: Secondary | ICD-10-CM | POA: Diagnosis not present

## 2024-04-14 DIAGNOSIS — D2271 Melanocytic nevi of right lower limb, including hip: Secondary | ICD-10-CM | POA: Diagnosis not present

## 2024-04-14 DIAGNOSIS — L821 Other seborrheic keratosis: Secondary | ICD-10-CM | POA: Diagnosis not present

## 2024-04-22 DIAGNOSIS — N289 Disorder of kidney and ureter, unspecified: Secondary | ICD-10-CM | POA: Diagnosis not present

## 2024-04-22 DIAGNOSIS — N2889 Other specified disorders of kidney and ureter: Secondary | ICD-10-CM | POA: Diagnosis not present

## 2024-04-22 DIAGNOSIS — R3915 Urgency of urination: Secondary | ICD-10-CM | POA: Diagnosis not present

## 2024-04-28 DIAGNOSIS — H59031 Cystoid macular edema following cataract surgery, right eye: Secondary | ICD-10-CM | POA: Diagnosis not present

## 2024-05-06 DIAGNOSIS — H52222 Regular astigmatism, left eye: Secondary | ICD-10-CM | POA: Insufficient documentation

## 2024-05-06 DIAGNOSIS — H52223 Regular astigmatism, bilateral: Secondary | ICD-10-CM | POA: Diagnosis not present

## 2024-05-06 DIAGNOSIS — H2512 Age-related nuclear cataract, left eye: Secondary | ICD-10-CM | POA: Diagnosis not present

## 2024-05-06 DIAGNOSIS — H353131 Nonexudative age-related macular degeneration, bilateral, early dry stage: Secondary | ICD-10-CM | POA: Diagnosis not present

## 2024-05-06 DIAGNOSIS — I10 Essential (primary) hypertension: Secondary | ICD-10-CM | POA: Diagnosis not present

## 2024-05-06 DIAGNOSIS — J45909 Unspecified asthma, uncomplicated: Secondary | ICD-10-CM | POA: Diagnosis not present

## 2024-05-06 DIAGNOSIS — H25812 Combined forms of age-related cataract, left eye: Secondary | ICD-10-CM | POA: Diagnosis not present

## 2024-05-14 NOTE — Telephone Encounter (Signed)
 Inbound call from [facility] requesting a new referral for patient with signature of provider as well as exact testing that is needed.   Please advise.

## 2024-05-14 NOTE — Telephone Encounter (Signed)
 Found referral , added information to [facility] Referral form and had Harlene Mail sign it. Jess ordered the referral and Dr Shila is out on vacation and not here to  sign so we can resubmit referral to [facility]   Refaxed today

## 2024-05-17 ENCOUNTER — Encounter: Payer: Self-pay | Admitting: Family Medicine

## 2024-05-17 ENCOUNTER — Ambulatory Visit: Admitting: Family Medicine

## 2024-05-17 VITALS — BP 122/82 | HR 64 | Temp 98.3°F | Wt 127.0 lb

## 2024-05-17 DIAGNOSIS — K219 Gastro-esophageal reflux disease without esophagitis: Secondary | ICD-10-CM | POA: Diagnosis not present

## 2024-05-17 DIAGNOSIS — R7309 Other abnormal glucose: Secondary | ICD-10-CM

## 2024-05-17 DIAGNOSIS — E782 Mixed hyperlipidemia: Secondary | ICD-10-CM | POA: Diagnosis not present

## 2024-05-17 DIAGNOSIS — I2584 Coronary atherosclerosis due to calcified coronary lesion: Secondary | ICD-10-CM

## 2024-05-17 DIAGNOSIS — I2510 Atherosclerotic heart disease of native coronary artery without angina pectoris: Secondary | ICD-10-CM | POA: Diagnosis not present

## 2024-05-17 DIAGNOSIS — M797 Fibromyalgia: Secondary | ICD-10-CM

## 2024-05-17 DIAGNOSIS — M1990 Unspecified osteoarthritis, unspecified site: Secondary | ICD-10-CM

## 2024-05-17 DIAGNOSIS — I10 Essential (primary) hypertension: Secondary | ICD-10-CM

## 2024-05-17 DIAGNOSIS — M8589 Other specified disorders of bone density and structure, multiple sites: Secondary | ICD-10-CM

## 2024-05-17 DIAGNOSIS — G479 Sleep disorder, unspecified: Secondary | ICD-10-CM

## 2024-05-17 DIAGNOSIS — I700 Atherosclerosis of aorta: Secondary | ICD-10-CM

## 2024-05-17 DIAGNOSIS — F418 Other specified anxiety disorders: Secondary | ICD-10-CM

## 2024-05-17 DIAGNOSIS — E039 Hypothyroidism, unspecified: Secondary | ICD-10-CM | POA: Diagnosis not present

## 2024-05-17 LAB — POCT GLYCOSYLATED HEMOGLOBIN (HGB A1C)
HbA1c POC (<> result, manual entry): 5.6 % (ref 4.0–5.6)
HbA1c, POC (controlled diabetic range): 5.6 % (ref 0.0–7.0)
HbA1c, POC (prediabetic range): 5.6 % — AB (ref 5.7–6.4)
Hemoglobin A1C: 5.6 % (ref 4.0–5.6)

## 2024-05-17 MED ORDER — LORAZEPAM 0.5 MG PO TABS: 0.5000 mg | ORAL_TABLET | Freq: Two times a day (BID) | ORAL | 5 refills | Status: DC | PRN

## 2024-05-17 MED ORDER — TIZANIDINE HCL 4 MG PO TABS: 4.0000 mg | ORAL_TABLET | Freq: Four times a day (QID) | ORAL | 5 refills | Status: DC | PRN

## 2024-05-17 MED ORDER — ATENOLOL 25 MG PO TABS: 12.5000 mg | ORAL_TABLET | Freq: Every day | ORAL | 1 refills | Status: DC

## 2024-05-17 MED ORDER — HYDROCHLOROTHIAZIDE 12.5 MG PO CAPS: 12.5000 mg | ORAL_CAPSULE | Freq: Every day | ORAL | 1 refills | Status: DC | PRN

## 2024-05-17 MED ORDER — FEXOFENADINE HCL 180 MG PO TABS: 180.0000 mg | ORAL_TABLET | Freq: Every day | ORAL | 1 refills | Status: DC

## 2024-05-17 MED ORDER — TRAZODONE HCL 50 MG PO TABS: 25.0000 mg | ORAL_TABLET | Freq: Every evening | ORAL | 1 refills | Status: DC | PRN

## 2024-05-17 MED ORDER — MONTELUKAST SODIUM 10 MG PO TABS: ORAL_TABLET | ORAL | 1 refills | Status: DC

## 2024-05-17 NOTE — Progress Notes (Signed)
 Patient ID: Rita Levine, female  DOB: 07-07-1951, 73 y.o.   MRN: [id number] Patient Care Team    Relationship Specialty Notifications Start End  Catherine Charlies LABOR, DO PCP - General Family Medicine  12/01/19   Dann Candyce RAMAN, MD PCP - Cardiology Cardiology  01/26/20   Teressa Toribio SQUIBB, MD (Inactive) Attending Physician Gastroenterology  12/01/19   Porter Andrez JONELLE DEVONNA (Inactive) Physician Assistant Dermatology  04/18/22   Geroge Iha, OD  Optometry  07/26/22     Chief Complaint  Patient presents with   Hypertension    Subjective: Rita Levine is a 73 y.o.  Female  present for chronic condition management All past medical history, surgical history, allergies, family history, immunizations, medications and social history were updated in the electronic medical record today. All recent labs, ED visits and hospitalizations within the last year were reviewed.  Gastroesophageal reflux disease, unspecified whether esophagitis present Patient reports symptoms are controlled on Protonix  and Pepcid .  Established with Dr. Shila   Essential hypertension/HLD/Statin declined/Palpitations/Obesity (BMI 30-39.9)/statin intolerance Pt reports compliance with atenolol , Maxide..   Pt does not daily baby ASA.  Patient is compliant with Zetia  use. Patient denies chest pain, shortness of breath, dizziness or lower extremity edema.     Fibromyalgia/osteoarthritis, unspecified osteoarthritis type, unspecified site/ DDD (degenerative disc disease), lumbar/Spinal stenosis of lumbar region, unspecified whether neurogenic claudication present Patient reports her fibromyalgia and arthritis symptoms are well controlled on tylenol , tumeric and Zanaflex .    Seasonal allergies/Allergic rhinitis due to pollen, unspecified seasonality/ Multiple food allergies She reports allergies are well controlled on Xyzal  nightly, Singulair  nightly and Allegra  as needed in the day. Prior note: Patient reports  her allergies have always been rather uncontrolled.  When she lived in a different state she had food allergy testing and was allergic to many foods.  She at one time had allergy shots and this is when her allergies were the best as far as control.  She reports frequent occurrence of sinus infections and sinus headaches.  She had an MRI in 2013.  Her current regimen consist of Xyzal , Allegra , Singulair  and Flonase .  She has taking Zyrtec in the past.  She reports the Xyzal  was added last year but she does not feel its been helpful.    Asthma, intrinsic Patient reports her asthma is well controlled.  Established with pulmonology   Hypothyroidism, unspecified type/Thyroid  nodule She reports compliance with Armour 45 mg total dose.  Labs are up-to-date.  [facility]  due to cost. Prior note: Thyroid  nodule history.  Last ultrasound 08/17/2018 with left thyroid  nodule.  Per radiology report 1 year follow-up was recommended.  Patient does endorse mild compression-like symptoms.  She states her prior PCP had ordered follow-up, but it was canceled secondary to Covid pandemic.  Patient had thyroid  ultrasound completed at [facility] radiology in [place].  Sleep disturbance: Pt reports ambien  5 mg at bedtime is ok, but she would like to try a medication that is not a hypnotic.      05/17/2024    9:08 AM 07/09/2023    2:12 PM 06/23/2023   11:54 AM 04/29/2023   10:07 AM 10/02/2022    8:21 AM  Depression screen PHQ 2/9  Decreased Interest 0 1 1 0 0  Down, Depressed, Hopeless 0 1 1 0 0  PHQ - 2 Score 0 2 2 0 0  Altered sleeping 1  2    Tired,  decreased energy 1  3    Change in appetite 0  0    Feeling bad or failure about yourself  0  2    Trouble concentrating 0  2    Moving slowly or fidgety/restless 0  0    Suicidal thoughts 0 0 0    PHQ-9 Score 2  11    Difficult doing work/chores Not difficult at all Somewhat difficult Very difficult         05/17/2024    9:09 AM 06/23/2023   11:55 AM 04/24/2022    8:51 AM 11/06/2021   11:22 AM  GAD 7 : Generalized Anxiety Score  Nervous, Anxious, on Edge 0 2 0 0  Control/stop worrying 0 2 0 0  Worry too much - different things 0 1 0 0  Trouble relaxing 0 2 0 0  Restless 0 0 0 0  Easily annoyed or irritable 0 0 0 0  Afraid - awful might happen 0 1 0 0  Total GAD 7 Score 0 8 0 0  Anxiety Difficulty Not difficult at all Somewhat difficult       Immunization History  Administered Date(s) Administered   Influenza-Unspecified 06/11/2019, 07/14/2020   Pneumococcal Conjugate-13 07/03/2016   Pneumococcal Polysaccharide-23 08/03/2019   Tdap 05/02/2014   Zoster, Live 10/14/2010    Past Medical History:  Diagnosis Date   Abdominal bloating 03/25/2024   Acne 06/02/2020   Allergic rhinitis    Allergic rhinitis due to pollen 12/03/2019   Allergy    Anemia    Asthma    Cataract    Chest pain, unspecified 04/07/2014   Chicken pox    Colon polyp    Combined forms of age-related cataract of left eye 05/06/2024   Combined forms of age-related cataract of right eye 03/04/2024   Diverticulosis    Dysphagia    Dysrhythmia    Palpitations   Fibromyalgia    Food allergy    Gallstone    Generalized abdominal pain 02/20/2023   GERD (gastroesophageal reflux disease)    Globus sensation 03/25/2024   Head movements abnormal 10/16/2020   Hiatal hernia    Hypercholesteremia    Hypertension    Hypokalemia 06/23/2023   Hypothyroidism    IBS (irritable bowel syndrome)    Ingrown toenail 08/23/2020   Migraine headache    occasionally   Morton neuroma, 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   Multiple food allergies 12/03/2019   Osteoarthritis    Osteopenia    Pneumonia    Pneumonia due to COVID-19 virus 06/23/2023   PONV (postoperative nausea and vomiting)    Pulmonary hypertension (HCC)    pt denies, pt states she was tested for it but was not diagnosed with it   Pulmonary nodule-3 mm right upper lobe  nodule-stable; no further evaluation required 04/24/2022   Rectal bleeding    Regular astigmatism of left eye 05/06/2024   Regular astigmatism of right eye 03/04/2024   SBO (small bowel obstruction) (HCC) 10/20/2017   Seasonal allergies    Seborrheic dermatitis of scalp 06/02/2020   Subconjunctival hemorrhage of left eye 08/01/2022   Thyroid  nodule    Trigger finger of left and right ring fingers 01/11/2021   Allergies  Allergen Reactions   Paxlovid  [Nirmatrelvir -Ritonavir ] Shortness Of Breath and Palpitations   Tetanus Toxoid Swelling    Reports a fever and headache with swelling.    Avelox [Moxifloxacin Hcl In Nacl] Hives    Racing heart   Cephalexin Hives   Codeine Hives  Heart racing   Cymbalta  [Duloxetine  Hcl] Other (See Comments)    Increased tremors   Erythromycin Hives   Propranolol Hives   Sulfa Antibiotics Hives   Sulfamethoxazole-Trimethoprim Hives   Tramadol Hives   Flagyl [Metronidazole] Other (See Comments)    Unknown reaction, unknown severity   Statins Other (See Comments)    Myalgia    Past Surgical History:  Procedure Laterality Date   APPENDECTOMY  1982   BREAST EXCISIONAL BIOPSY Right 1995   benign   BREAST LUMPECTOMY  1991   CARPAL TUNNEL RELEASE Right 2006   Right wrist   CATHETER REMOVAL     CESAREAN SECTION     x 2   CHOLECYSTECTOMY N/A 07/25/2020   Procedure: LAPAROSCOPIC CHOLECYSTECTOMY;  Surgeon: Aron Shoulders, MD;  Location: [facility] OR;  Service: General;  Laterality: N/A;   COLON RESECTION  1990   COLONOSCOPY     CT ABDOMEN PELVIS W ([facility] HX)  01/03/2020   IMAGE MRI brain:  04/2012   No focal IAC or inner ear lesion to explain hearing loss. Slightly greater than expected number of subcortical T2- hyperintensities bilaterally.  These are nonspecific.  They can be seen in the setting of chronic migraine headaches, demyelinating process, chronic microvascular ischemic, prior infection or inflammation, or vasculitis   IMAGE MRI lumbar:   05/01/2006   Mild central canal stenosis with facet arthropathy and mildly bulging disc at L4-5.  There is mild narrowing in the left lateral recess at this level.  No definite neural impingement.  Appearance at this level has not markedly Moderately severe facet arthropathy at L5-S1 with mild interval progression of a diffuse broad based disc bulge.  There is mild biforaminal narrowing.  Central canal is open   LAPAROSCOPIC ABDOMINAL EXPLORATION  2019   adhesion removal> caused bowel blockage    NASAL SEPTUM SURGERY  2004   TONSILLECTOMY  1965   TOTAL ABDOMINAL HYSTERECTOMY W/ BILATERAL SALPINGOOPHORECTOMY  1988   TUBAL LIGATION  1974   UPPER GASTROINTESTINAL ENDOSCOPY  2020   Family History  Problem Relation Age of Onset   Hyperlipidemia Father    Hypertension Father    Arthritis Father    Diabetes Father    Asthma Father    COPD Father    Early death Father    Parkinson's disease Father    Hypertension Mother    Hyperlipidemia Mother    Macular degeneration Mother    Arthritis Mother    Hearing loss Mother    Heart disease Mother    Kidney disease Mother    Throat cancer Brother        lung, and tongue   Arthritis Brother    COPD Brother    Diabetes type II Sister    COPD Sister    Heart disease Sister    Hypertension Sister    Thyroid  cancer Sister    Lung cancer Sister    Early death Sister    COPD Sister    Breast cancer Maternal Aunt    Lung cancer Other        uncle   Emphysema Maternal Aunt    Emphysema Maternal Uncle    Clotting disorder Sister    Brain cancer Sister        brain tumor- not malignant   Breast cancer Cousin    Cancer Sister    Alcohol abuse Sister    COPD Sister    Early death Sister    Alcohol abuse Brother  Arthritis Brother    Depression Brother    Diabetes Brother    Hypercholesterolemia Brother    Colon cancer Neg Hx    Esophageal cancer Neg Hx    Rectal cancer Neg Hx    Stomach cancer Neg Hx    Social History   Social  History Narrative   Marital status/children/pets: married, 2 children   Education/employment: HS grad. retired   Field seismologist:      -smoke alarm in the home:Yes     - wears seatbelt: Yes     - Feels safe in their relationships: Yes    Allergies as of 05/17/2024       Reactions   Paxlovid  [nirmatrelvir -ritonavir ] Shortness Of Breath, Palpitations   Tetanus Toxoid Swelling   Reports a fever and headache with swelling.    Avelox [moxifloxacin Hcl In Nacl] Hives   Racing heart   Cephalexin Hives   Codeine Hives   Heart racing   Cymbalta  [duloxetine  Hcl] Other (See Comments)   Increased tremors   Erythromycin Hives   Propranolol Hives   Sulfa Antibiotics Hives   Sulfamethoxazole-trimethoprim Hives   Tramadol Hives   Flagyl [metronidazole] Other (See Comments)   Unknown reaction, unknown severity   Statins Other (See Comments)   Myalgia        Medication List        Accurate as of May 17, 2024 10:21 AM. If you have any questions, ask your nurse or doctor.          albuterol  108 (90 Base) MCG/ACT inhaler Commonly known as: ProAir  HFA Inhale 2 puffs into the lungs every 6 (six) hours as needed.   albuterol  (2.5 MG/3ML) 0.083% nebulizer solution Commonly known as: PROVENTIL  Take 3 mLs (2.5 mg total) by nebulization every 6 (six) hours as needed.   Armour Thyroid  30 MG tablet Generic drug: thyroid  Take 1 tablet by mouth in the AM once a day   Armour Thyroid  15 MG tablet Generic drug: thyroid  Take 1 tablet by mouth in the AM Once a day   atenolol  25 MG tablet Commonly known as: TENORMIN  Take 0.5-1 tablets (12.5-25 mg total) by mouth daily.   budesonide  0.5 MG/2ML nebulizer solution Commonly known as: PULMICORT  TAKE 2 MLS (0.5 MG TOTAL) BY NEBULIZATION IN THE MORNING AND AT BEDTIME. What changed: See the new instructions.   clotrimazole  10 MG troche Commonly known as: MYCELEX  Take 1 tablet (10 mg total) by mouth 4 (four) times daily as needed.   CoQ-10 100 MG  Caps Take 100 mg by mouth daily.   famotidine  40 MG tablet Commonly known as: PEPCID  Take 1 tablet (40 mg total) by mouth 2 (two) times daily.   fexofenadine  180 MG tablet Commonly known as: Allergy Relief Take 1 tablet (180 mg total) by mouth daily.   fluticasone  50 MCG/ACT nasal spray Commonly known as: FLONASE  Place 2 sprays into both nostrils daily.   folic acid 1 MG tablet Commonly known as: FOLVITE SMARTSIG:2 Tablet(s) By Mouth   hydrochlorothiazide  12.5 MG capsule Commonly known as: MICROZIDE  Take 1 capsule (12.5 mg total) by mouth daily as needed.   hydroxychloroquine  200 MG tablet Commonly known as: Plaquenil  1 tablet p.o. twice a day for a week then 1 tab p.o. daily   hyoscyamine  0.125 MG tablet Commonly known as: LEVSIN  Take 1 tablet (0.125 mg total) by mouth every 6 (six) hours as needed.   LORazepam  0.5 MG tablet Commonly known as: ATIVAN  Take 1 tablet (0.5 mg total) by mouth  2 (two) times daily as needed for anxiety.   mometasone 0.1 % lotion Commonly known as: ELOCON Apply 1 application topically daily as needed (ear irritation).   montelukast  10 MG tablet Commonly known as: SINGULAIR  TAKE 1 TABLET BY MOUTH EVERYDAY AT BEDTIME   ondansetron  8 MG disintegrating tablet Commonly known as: ZOFRAN -ODT TAKE 1 TABLET BY MOUTH EVERY 8 HOURS AS NEEDED FOR NAUSEA OR VOMITING.   Probiotic Caps Take 1 capsule by mouth daily.   Repatha  SureClick 140 MG/ML Soaj Generic drug: Evolocumab  Inject 140 mg into the skin every 14 (fourteen) days.   THERATEARS OP Place 1 drop into both eyes 2 (two) times daily.   tiZANidine  4 MG tablet Commonly known as: ZANAFLEX  Take 1 tablet (4 mg total) by mouth every 6 (six) hours as needed for muscle spasms.   traZODone  50 MG tablet Commonly known as: DESYREL  Take 0.5-2 tablets (25-100 mg total) by mouth at bedtime as needed for sleep. Started by: Charlies Bellini   vitamin C 1000 MG tablet Take 3,000 mg by mouth daily.    Wixela Inhub 250-50 MCG/ACT Aepb Generic drug: fluticasone -salmeterol INHALE 1 PUFF INTO THE LUNGS IN THE MORNING AND AT BEDTIME.   zinc gluconate 50 MG tablet Take 100 mg by mouth daily.   zolpidem  5 MG tablet Commonly known as: AMBIEN  Take 1 tablet (5 mg total) by mouth at bedtime. As needed        All past medical history, surgical history, allergies, family history, immunizations andmedications were updated in the EMR today and reviewed under the history and medication portions of their EMR.      ROS: 14 pt review of systems performed and negative (unless mentioned in an HPI)  Objective: BP 122/82   Pulse 64   Temp 98.3 F (36.8 C)   Wt 127 lb (57.6 kg)   SpO2 97%   BMI 25.65 kg/m  Physical Exam Vitals and nursing note reviewed.  Constitutional:      General: She is not in acute distress.    Appearance: Normal appearance. She is not ill-appearing, toxic-appearing or diaphoretic.  HENT:     Head: Normocephalic and atraumatic.  Eyes:     General: No scleral icterus.       Right eye: No discharge.        Left eye: No discharge.     Extraocular Movements: Extraocular movements intact.     Conjunctiva/sclera: Conjunctivae normal.     Pupils: Pupils are equal, round, and reactive to light.  Cardiovascular:     Rate and Rhythm: Normal rate and regular rhythm.  Pulmonary:     Effort: Pulmonary effort is normal. No respiratory distress.     Breath sounds: Normal breath sounds. No wheezing, rhonchi or rales.  Musculoskeletal:     Right lower leg: No edema.     Left lower leg: No edema.  Skin:    General: Skin is warm.     Findings: No rash.  Neurological:     Mental Status: She is alert and oriented to person, place, and time. Mental status is at baseline.     Motor: No weakness.     Gait: Gait normal.  Psychiatric:        Mood and Affect: Mood normal.        Behavior: Behavior normal.        Thought Content: Thought content normal.        Judgment: Judgment  normal.     No results found.  Assessment/plan: DORTHEA MAINA is a 73 y.o. female present for chronic condition management Gastroesophageal reflux disease, unspecified whether esophagitis present Managed by GI.  They prescribed her Protonix  and Pepcid    Essential hypertension/HLD/Statin declined/Palpitations/Obesity (BMI 30-39.9)-long-term current use of medication Stable Continue Tenormin  12.5 mg qhs Continue hctz 12.5 mg daily PRN if notable fluid or  losartan  1/2 tab. Only if pressures are elevated > 135 systolic.  Patient reports compliance with low dose statin twice a week supplied by Dr. Varanassi.   Low-sodium diet.  Routine exercise. Labs: due next visit  Elevated A1c: Last A1c 6.2>6.6>5.6 collected today Much better!   Osteoarthritis, unspecified osteoarthritis type, unspecified site/ DDD (degenerative disc disease), lumbar/Spinal stenosis of lumbar region, unspecified whether neurogenic claudication present/Fibromyalgia Stable Continue Zanaflex    Seasonal allergies/Allergic rhinitis due to pollen, unspecified seasonality/ Multiple food allergies Continue allergy med of choice.  Vistaril   Tired & not helpful.  Continue Singulair  10 mg nightly Continue flonase   Sleep disturbance: Pt would like to try another med class to help with her insomnia. She would like to avoid hypnotics.  Discussed trazodone  tapering today and pt would like to try in place of ambien  Tired vistaril - not helpful. Could consider trazodone  in the future if willing.  nccs database reviewed today  Asthma, intrinsic Continue management with pulmonology with supplies daily inhaler prescriptions Continue albuterol  as needed Continue antihistamine regimen   Hypothyroidism, unspecified type/Thyroid  nodule Stable Continue Armour Thyroid  total dose 45 mg daily (30/15).  This is the [facility]. TSH UTD - US  THYROID -improvement in size of thyroid  nodule  and no further studies needed in the future.  Osteopenia, unspecified location -Vitamin D  UTD -UTD 2024, T-score -2.3   Return in about 23 weeks (around 10/25/2024) for cpe (20 min), Routine chronic condition follow-up.    Orders Placed This Encounter  Procedures   POCT HgB A1C    Meds ordered this encounter  Medications   atenolol  (TENORMIN ) 25 MG tablet    Sig: Take 0.5-1 tablets (12.5-25 mg total) by mouth daily.    Dispense:  90 tablet    Refill:  1   fexofenadine  (ALLERGY RELIEF) 180 MG tablet    Sig: Take 1 tablet (180 mg total) by mouth daily.    Dispense:  90 tablet    Refill:  1   hydrochlorothiazide  (MICROZIDE ) 12.5 MG capsule    Sig: Take 1 capsule (12.5 mg total) by mouth daily as needed.    Dispense:  90 capsule    Refill:  1   LORazepam  (ATIVAN ) 0.5 MG tablet    Sig: Take 1 tablet (0.5 mg total) by mouth 2 (two) times daily as needed for anxiety.    Dispense:  30 tablet    Refill:  5   montelukast  (SINGULAIR ) 10 MG tablet    Sig: TAKE 1 TABLET BY MOUTH EVERYDAY AT BEDTIME    Dispense:  90 tablet    Refill:  1   tiZANidine  (ZANAFLEX ) 4 MG tablet    Sig: Take 1 tablet (4 mg total) by mouth every 6 (six) hours as needed for muscle spasms.    Dispense:  120 tablet    Refill:  5   traZODone  (DESYREL ) 50 MG tablet    Sig: Take 0.5-2 tablets (25-100 mg total) by mouth at bedtime as needed for sleep.    Dispense:  180 tablet    Refill:  1    Referral Orders  No referral(s) requested today  Electronically signed by: Charlies Bellini, DO [facility] [place]

## 2024-05-17 NOTE — Patient Instructions (Addendum)

## 2024-05-20 ENCOUNTER — Encounter: Payer: Self-pay | Admitting: Gastroenterology

## 2024-05-26 ENCOUNTER — Ambulatory Visit: Payer: Medicare HMO | Admitting: Nurse Practitioner

## 2024-05-27 ENCOUNTER — Ambulatory Visit: Admitting: Nurse Practitioner

## 2024-05-27 ENCOUNTER — Encounter: Payer: Self-pay | Admitting: Nurse Practitioner

## 2024-05-28 ENCOUNTER — Ambulatory Visit: Payer: Self-pay

## 2024-05-28 NOTE — Telephone Encounter (Signed)
  Appointment made for 06/02/2024 at 7:40 AM with patient's PCP dr Charlies Bellini  Patient added to Wait List and would like a call if a sooner appointment is available   FYI Only or Action Required?: FYI only for provider.  Patient was last seen in primary care on 05/17/2024 by Bellini Charlies A, DO.  Called Nurse Triage reporting No chief complaint on file..  Symptoms began over a year ago.  Interventions attempted: Other: patient states that she spoke with an ENT and her PCP about this in the past but it was never fully addressed/evaluated.  Symptoms are: gradually worsening over a year.  Triage Disposition: See PCP When Office is Open (Within 3 Days)  Patient/caregiver understands and will follow disposition?:            Copied from CRM #[id number]. Topic: Clinical - Red Word Triage >> May 28, 2024 10:11 AM Deleta RAMAN wrote: Red Word that prompted transfer to Nurse Triage: patient has swollen glands in neck and lump on her face/ jaw bone area that has worsen overtime. Patient has been experiencing glands for a year and jaw lumps for a month Reason for Disposition  [1] Small swelling or lump AND [2] unexplained AND [3] present > 1 week  Answer Assessment - Initial Assessment Questions Patient states she has been an ENT and the PCP office and advised them of this in the past. Nobody has addressed this.  Knot on left side of jaw near ear--small like fingertip Swollen glands on both sides and  Present over a year--worsening over time  Patient denies any fever, itching, difficulty breathing, difficulty swallowing, pain when not being palpated,    1. APPEARANCE of SWELLING: What does it look like?     Patient states that the knot on her jaw is  2. SIZE: How large is the swelling? (e.g., inches, cm; or compare to size of pinhead, tip of pen, eraser, coin, pea, grape, ping pong ball)      Small-size of tip of finger 3. LOCATION: Where is the swelling located?     Multiple  possible nodes in both sides of neck per patient 4. ONSET: When did the swelling start?     Over a year ago 5. COLOR: What color is it? Is there more than one color?     N/a 6. PAIN: Is there any pain? If Yes, ask: How bad is the pain? (Scale 1-10; or mild, moderate, severe)       Not very painful unless being palpated 7. ITCH: Does it itch? If Yes, ask: How bad is the itch?      no 8. CAUSE: What do you think caused the swelling?     unknown 9 OTHER SYMPTOMS: Do you have any other symptoms? (e.g., fever)     Patient denies  Protocols used: Skin Lump or Localized Swelling-A-AH

## 2024-06-02 ENCOUNTER — Ambulatory Visit: Admitting: Family Medicine

## 2024-06-02 DIAGNOSIS — Z0100 Encounter for examination of eyes and vision without abnormal findings: Secondary | ICD-10-CM | POA: Diagnosis not present

## 2024-06-15 ENCOUNTER — Encounter: Payer: Self-pay | Admitting: Sports Medicine

## 2024-06-15 DIAGNOSIS — M1711 Unilateral primary osteoarthritis, right knee: Secondary | ICD-10-CM | POA: Diagnosis not present

## 2024-06-25 ENCOUNTER — Encounter (HOSPITAL_BASED_OUTPATIENT_CLINIC_OR_DEPARTMENT_OTHER): Payer: Self-pay

## 2024-07-13 ENCOUNTER — Telehealth: Payer: Self-pay | Admitting: Nurse Practitioner

## 2024-07-13 DIAGNOSIS — J45909 Unspecified asthma, uncomplicated: Secondary | ICD-10-CM

## 2024-07-13 MED ORDER — FLUTICASONE-SALMETEROL 250-50 MCG/ACT IN AEPB: 1.0000 | INHALATION_SPRAY | Freq: Two times a day (BID) | RESPIRATORY_TRACT | 5 refills | Status: DC

## 2024-07-13 NOTE — Telephone Encounter (Signed)
 Called patient to inform refill was sent.NFN

## 2024-07-13 NOTE — Telephone Encounter (Signed)
 Copied from CRM [id number]. Topic: Clinical - Medication Refill >> Jul 13, 2024  8:23 AM Isabell A wrote: Medication: WIXELA INHUB 250-50 MCG/ACT AEPB [[id number]]  Has the patient contacted their pharmacy? No (Agent: If no, request that the patient contact the pharmacy for the refill. If patient does not wish to contact the pharmacy document the reason why and proceed with request.) (Agent: If yes, when and what did the pharmacy advise?)  This is the patient's preferred pharmacy:  [id number] - [place], [place] - [address] [place] [address] [place] [address] [place] [address] Phone: [phone] Fax: [phone]  Is this the correct pharmacy for this prescription? Yes If no, delete pharmacy and type the correct one.   Has the prescription been filled recently? Yes  Is the patient out of the medication? No  Has the patient been seen for an appointment in the last year OR does the patient have an upcoming appointment? Yes  Can we respond through MyChart? No  Agent: Please be advised that Rx refills may take up to 3 business days. We ask that you follow-up with your pharmacy.

## 2024-07-15 ENCOUNTER — Telehealth: Payer: Self-pay | Admitting: Gastroenterology

## 2024-07-15 MED ORDER — VOQUEZNA 20 MG PO TABS: 20.0000 mg | ORAL_TABLET | Freq: Every day | ORAL | 1 refills | Status: DC

## 2024-07-15 NOTE — Telephone Encounter (Signed)
  Okay to refill Voquezna ? 20mg  or 10mg /

## 2024-07-15 NOTE — Telephone Encounter (Signed)
 Inbound call from patient stating she would like to know if she can be prescribed voquezna , patient is stating she's having the trouble swallowing issue again and states that's the only medication that has helped her.  Please advise  Thank you

## 2024-07-15 NOTE — Telephone Encounter (Signed)
 [facility] [place] She will see motility specialist at [facility] on 08/31/24. Follow up here 10/05/24.

## 2024-07-15 NOTE — Telephone Encounter (Signed)
 Please send Rx for Voquezna  20mg  daily X 30 days with 1 refill . Please also schedule next available office visit with me or APP. Thanks

## 2024-07-16 ENCOUNTER — Ambulatory Visit: Payer: Self-pay | Admitting: Family Medicine

## 2024-07-16 NOTE — Telephone Encounter (Signed)
 Unable to reach pt. Pt will need an appt.

## 2024-07-16 NOTE — Telephone Encounter (Signed)
 FYI Only or Action Required?: Action required by provider: clinical question for provider.  Patient was last seen in primary care on 05/17/2024 by Catherine Fuller A, DO.  Called Nurse Triage reporting Sore Throat.  Symptoms began several days ago.  Triage Disposition: Call PCP When Office is Open  Patient/caregiver understands and will follow disposition?: Yes              Copied from CRM [id number]. Topic: Clinical - Red Word Triage >> Jul 16, 2024  8:38 AM [name] wrote: Red Word that prompted transfer to Nurse Triage: patient called stating she she has swelling in her throat and she want to know if a shot of DepoMedrol would help with the swelling. Patient stated it is a swallowing disorder. Patient stated the doctor is aware Reason for Disposition  [1] Caller requesting NON-URGENT health information AND [2] PCP's office is the best resource  Answer Assessment - Initial Assessment Questions Pt states she has a swallowing disorder and her PCP is aware (diagnosed a little over a year ago). Pt is waiting on a medication at pharmacy (Vonoprazan Fumarate   20 MG TABS) that has to be specially ordered. In the meantime, pt is requesting a Depo-Medrol  shot to help with the swallowing. Pt states she has an appt at [facility] for the issue on 11/18.  ONSET: When did the swallowing disorder start? (Hours or days ago)      A little over a year ago pt was diagnosed with this; Middle of night to morning it is worse; it eases up in evening where she can eat a little bit  It flares up intermittently- started Tues night  Denies difficulty breathing, pain with swallowing  Protocols used: Sore Throat-A-AH, Information Only Call - No Triage-A-AH

## 2024-07-19 ENCOUNTER — Encounter: Payer: Self-pay | Admitting: Family Medicine

## 2024-07-19 ENCOUNTER — Ambulatory Visit (INDEPENDENT_AMBULATORY_CARE_PROVIDER_SITE_OTHER): Admitting: Family Medicine

## 2024-07-19 VITALS — BP 120/80 | HR 70 | Temp 98.3°F | Wt 126.0 lb

## 2024-07-19 DIAGNOSIS — J301 Allergic rhinitis due to pollen: Secondary | ICD-10-CM

## 2024-07-19 DIAGNOSIS — R0982 Postnasal drip: Secondary | ICD-10-CM

## 2024-07-19 MED ORDER — METHYLPREDNISOLONE ACETATE 80 MG/ML IJ SUSP
80.0000 mg | Freq: Once | INTRAMUSCULAR | Status: AC
  Administered 2024-07-19: 80 mg via INTRAMUSCULAR

## 2024-07-19 NOTE — Progress Notes (Signed)
 Rita Levine , 1951-04-24, 73 y.o., female MRN: [id number] Patient Care Team    Relationship Specialty Notifications Start End  Catherine Charlies LABOR, DO PCP - General Family Medicine  12/01/19   Dann Candyce RAMAN, MD PCP - Cardiology Cardiology  01/26/20   Teressa Toribio SQUIBB, MD (Inactive) Attending Physician Gastroenterology  12/01/19   Porter Andrez JONELLE DEVONNA (Inactive) Physician Assistant Dermatology  04/18/22   Geroge Iha, OD  Optometry  07/26/22     Chief Complaint  Patient presents with   Esophageal Swelling    3 weeks; Pt feels there is swelling, which is causing issues with swallowing. Noticed more so in the mornings. Pt requesting Depo injection.      Subjective: Rita Levine is a 73 y.o. Pt presents for an OV with complaints of swelling in her throat of 3 weeks duration.  Associated symptoms include increased congestion and allergies.  Patient reports she has trouble swallowing with increased mucus obstruction.  Symptoms are worse in the morning and improved as the day goes on. Patient is prescribed Singulair , antihistamine and inhalers.  She is following with GI for dysphagia     05/17/2024    9:08 AM 07/09/2023    2:12 PM 06/23/2023   11:54 AM 04/29/2023   10:07 AM 10/02/2022    8:21 AM  Depression screen PHQ 2/9  Decreased Interest 0 1 1 0 0  Down, Depressed, Hopeless 0 1 1 0 0  PHQ - 2 Score 0 2 2 0 0  Altered sleeping 1  2    Tired, decreased energy 1  3    Change in appetite 0  0    Feeling bad or failure about yourself  0  2    Trouble concentrating 0  2    Moving slowly or fidgety/restless 0  0    Suicidal thoughts 0 0 0    PHQ-9 Score 2  11    Difficult doing work/chores Not difficult at all Somewhat difficult Very difficult      Allergies  Allergen Reactions   Paxlovid  [Nirmatrelvir -Ritonavir ] Shortness Of Breath and Palpitations   Tetanus Toxoid Swelling    Reports a fever and headache with swelling.    Avelox [Moxifloxacin Hcl In Nacl] Hives     Racing heart   Cephalexin Hives   Codeine Hives    Heart racing   Cymbalta  [Duloxetine  Hcl] Other (See Comments)    Increased tremors   Erythromycin Hives   Propranolol Hives   Sulfa Antibiotics Hives   Sulfamethoxazole-Trimethoprim Hives   Tramadol Hives   Flagyl [Metronidazole] Other (See Comments)    Unknown reaction, unknown severity   Statins Other (See Comments)    Myalgia    Social History   Social History Narrative   Marital status/children/pets: married, 2 children   Education/employment: HS grad. retired   Field seismologist:      -smoke alarm in the home:Yes     - wears seatbelt: Yes     - Feels safe in their relationships: Yes   Past Medical History:  Diagnosis Date   Abdominal bloating 03/25/2024   Acne 06/02/2020   Allergic rhinitis    Allergic rhinitis due to pollen 12/03/2019   Allergy    Anemia    Asthma    Cataract    Chest pain, unspecified 04/07/2014   Chicken pox    Colon polyp    Combined forms of age-related cataract of left eye 05/06/2024   Combined forms  of age-related cataract of right eye 03/04/2024   Diverticulosis    Dysphagia    Dysrhythmia    Palpitations   Fibromyalgia    Food allergy    Gallstone    Generalized abdominal pain 02/20/2023   GERD (gastroesophageal reflux disease)    Globus sensation 03/25/2024   Head movements abnormal 10/16/2020   Hiatal hernia    Hypercholesteremia    Hypertension    Hypokalemia 06/23/2023   Hypothyroidism    IBS (irritable bowel syndrome)    Ingrown toenail 08/23/2020   Migraine headache    occasionally   Morton neuroma, 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   Multiple food allergies 12/03/2019   Osteoarthritis    Osteopenia    Pneumonia    Pneumonia due to COVID-19 virus 06/23/2023   PONV (postoperative nausea and vomiting)    Pulmonary hypertension (HCC)    pt denies, pt states she was tested for it but was not diagnosed with it   Pulmonary nodule-3 mm right upper lobe nodule-stable; no further evaluation  required 04/24/2022   Rectal bleeding    Regular astigmatism of left eye 05/06/2024   Regular astigmatism of right eye 03/04/2024   SBO (small bowel obstruction) (HCC) 10/20/2017   Seasonal allergies    Seborrheic dermatitis of scalp 06/02/2020   Subconjunctival hemorrhage of left eye 08/01/2022   Thyroid  nodule    Trigger finger of left and right ring fingers 01/11/2021   Past Surgical History:  Procedure Laterality Date   APPENDECTOMY  1982   BREAST EXCISIONAL BIOPSY Right 1995   benign   BREAST LUMPECTOMY  1991   CARPAL TUNNEL RELEASE Right 2006   Right wrist   CATHETER REMOVAL     CESAREAN SECTION     x 2   CHOLECYSTECTOMY N/A 07/25/2020   Procedure: LAPAROSCOPIC CHOLECYSTECTOMY;  Surgeon: Aron Shoulders, MD;  Location: [facility] OR;  Service: General;  Laterality: N/A;   COLON RESECTION  1990   COLONOSCOPY     CT ABDOMEN PELVIS W ([facility] HX)  01/03/2020   IMAGE MRI brain:  04/2012   No focal IAC or inner ear lesion to explain hearing loss. Slightly greater than expected number of subcortical T2- hyperintensities bilaterally.  These are nonspecific.  They can be seen in the setting of chronic migraine headaches, demyelinating process, chronic microvascular ischemic, prior infection or inflammation, or vasculitis   IMAGE MRI lumbar:  05/01/2006   Mild central canal stenosis with facet arthropathy and mildly bulging disc at L4-5.  There is mild narrowing in the left lateral recess at this level.  No definite neural impingement.  Appearance at this level has not markedly Moderately severe facet arthropathy at L5-S1 with mild interval progression of a diffuse broad based disc bulge.  There is mild biforaminal narrowing.  Central canal is open   LAPAROSCOPIC ABDOMINAL EXPLORATION  2019   adhesion removal> caused bowel blockage    NASAL SEPTUM SURGERY  2004   TONSILLECTOMY  1965   TOTAL ABDOMINAL HYSTERECTOMY W/ BILATERAL SALPINGOOPHORECTOMY  1988   TUBAL LIGATION  1974   UPPER  GASTROINTESTINAL ENDOSCOPY  2020   Family History  Problem Relation Age of Onset   Hyperlipidemia Father    Hypertension Father    Arthritis Father    Diabetes Father    Asthma Father    COPD Father    Early death Father    Parkinson's disease Father    Hypertension Mother    Hyperlipidemia Mother    Macular degeneration Mother  Arthritis Mother    Hearing loss Mother    Heart disease Mother    Kidney disease Mother    Throat cancer Brother        lung, and tongue   Arthritis Brother    COPD Brother    Diabetes type II Sister    COPD Sister    Heart disease Sister    Hypertension Sister    Thyroid  cancer Sister    Lung cancer Sister    Early death Sister    COPD Sister    Breast cancer Maternal Aunt    Lung cancer Other        uncle   Emphysema Maternal Aunt    Emphysema Maternal Uncle    Clotting disorder Sister    Brain cancer Sister        brain tumor- not malignant   Breast cancer Cousin    Cancer Sister    Alcohol abuse Sister    COPD Sister    Early death Sister    Alcohol abuse Brother    Arthritis Brother    Depression Brother    Diabetes Brother    Hypercholesterolemia Brother    Colon cancer Neg Hx    Esophageal cancer Neg Hx    Rectal cancer Neg Hx    Stomach cancer Neg Hx    Allergies as of 07/19/2024       Reactions   Paxlovid  [nirmatrelvir -ritonavir ] Shortness Of Breath, Palpitations   Tetanus Toxoid Swelling   Reports a fever and headache with swelling.    Avelox [moxifloxacin Hcl In Nacl] Hives   Racing heart   Cephalexin Hives   Codeine Hives   Heart racing   Cymbalta  [duloxetine  Hcl] Other (See Comments)   Increased tremors   Erythromycin Hives   Propranolol Hives   Sulfa Antibiotics Hives   Sulfamethoxazole-trimethoprim Hives   Tramadol Hives   Flagyl [metronidazole] Other (See Comments)   Unknown reaction, unknown severity   Statins Other (See Comments)   Myalgia        Medication List        Accurate as of  July 19, 2024 11:44 AM. If you have any questions, ask your nurse or doctor.          STOP taking these medications    clotrimazole  10 MG troche Commonly known as: MYCELEX  Stopped by: Charlies Bellini   zolpidem  5 MG tablet Commonly known as: AMBIEN  Stopped by: Charlies Bellini       TAKE these medications    albuterol  108 (90 Base) MCG/ACT inhaler Commonly known as: ProAir  HFA Inhale 2 puffs into the lungs every 6 (six) hours as needed.   albuterol  (2.5 MG/3ML) 0.083% nebulizer solution Commonly known as: PROVENTIL  Take 3 mLs (2.5 mg total) by nebulization every 6 (six) hours as needed.   Armour Thyroid  30 MG tablet Generic drug: thyroid  Take 1 tablet by mouth in the AM once a day   Armour Thyroid  15 MG tablet Generic drug: thyroid  Take 1 tablet by mouth in the AM Once a day   atenolol  25 MG tablet Commonly known as: TENORMIN  Take 0.5-1 tablets (12.5-25 mg total) by mouth daily.   budesonide  0.5 MG/2ML nebulizer solution Commonly known as: PULMICORT  TAKE 2 MLS (0.5 MG TOTAL) BY NEBULIZATION IN THE MORNING AND AT BEDTIME. What changed: See the new instructions.   CoQ-10 100 MG Caps Take 100 mg by mouth daily.   famotidine  40 MG tablet Commonly known as: PEPCID  Take 1 tablet (40 mg  total) by mouth 2 (two) times daily.   fexofenadine  180 MG tablet Commonly known as: Allergy Relief Take 1 tablet (180 mg total) by mouth daily.   fluticasone  50 MCG/ACT nasal spray Commonly known as: FLONASE  Place 2 sprays into both nostrils daily.   fluticasone -salmeterol 250-50 MCG/ACT Aepb Commonly known as: Wixela Inhub Inhale 1 puff into the lungs 2 (two) times daily. in the morning and at bedtime.   folic acid 1 MG tablet Commonly known as: FOLVITE SMARTSIG:2 Tablet(s) By Mouth   hydrochlorothiazide  12.5 MG capsule Commonly known as: MICROZIDE  Take 1 capsule (12.5 mg total) by mouth daily as needed.   hydroxychloroquine  200 MG tablet Commonly known as: Plaquenil  1  tablet p.o. twice a day for a week then 1 tab p.o. daily   hyoscyamine  0.125 MG tablet Commonly known as: LEVSIN  Take 1 tablet (0.125 mg total) by mouth every 6 (six) hours as needed.   LORazepam  0.5 MG tablet Commonly known as: ATIVAN  Take 1 tablet (0.5 mg total) by mouth 2 (two) times daily as needed for anxiety.   mometasone 0.1 % lotion Commonly known as: ELOCON Apply 1 application topically daily as needed (ear irritation).   montelukast  10 MG tablet Commonly known as: SINGULAIR  TAKE 1 TABLET BY MOUTH EVERYDAY AT BEDTIME   ondansetron  8 MG disintegrating tablet Commonly known as: ZOFRAN -ODT TAKE 1 TABLET BY MOUTH EVERY 8 HOURS AS NEEDED FOR NAUSEA OR VOMITING.   Probiotic Caps Take 1 capsule by mouth daily.   Repatha  SureClick 140 MG/ML Soaj Generic drug: Evolocumab  Inject 140 mg into the skin every 14 (fourteen) days.   THERATEARS OP Place 1 drop into both eyes 2 (two) times daily.   tiZANidine  4 MG tablet Commonly known as: ZANAFLEX  Take 1 tablet (4 mg total) by mouth every 6 (six) hours as needed for muscle spasms.   traZODone  50 MG tablet Commonly known as: DESYREL  Take 0.5-2 tablets (25-100 mg total) by mouth at bedtime as needed for sleep.   vitamin C 1000 MG tablet Take 3,000 mg by mouth daily.   Voquezna  20 MG Tabs Generic drug: Vonoprazan Fumarate  Take 20 mg by mouth daily.   zinc gluconate 50 MG tablet Take 100 mg by mouth daily.        All past medical history, surgical history, allergies, family history, immunizations andmedications were updated in the EMR today and reviewed under the history and medication portions of their EMR.     ROS Negative, with the exception of above mentioned in HPI   Objective:  BP 120/80   Pulse 70   Temp 98.3 F (36.8 C)   Wt 126 lb (57.2 kg)   SpO2 97%   BMI 25.45 kg/m  Body mass index is 25.45 kg/m. Physical Exam Vitals and nursing note reviewed.  Constitutional:      General: She is not in acute  distress.    Appearance: Normal appearance. She is normal weight. She is not ill-appearing or toxic-appearing.  HENT:     Head: Normocephalic and atraumatic.     Right Ear: Tympanic membrane, ear canal and external ear normal. There is no impacted cerumen.     Left Ear: Tympanic membrane, ear canal and external ear normal. There is no impacted cerumen.     Nose: Congestion and rhinorrhea present.     Mouth/Throat:     Mouth: Mucous membranes are moist.     Pharynx: No oropharyngeal exudate or posterior oropharyngeal erythema.     Comments: Postnasal drip present  Eyes:     General: No scleral icterus.       Right eye: No discharge.        Left eye: No discharge.     Extraocular Movements: Extraocular movements intact.     Conjunctiva/sclera: Conjunctivae normal.     Pupils: Pupils are equal, round, and reactive to light.  Cardiovascular:     Rate and Rhythm: Normal rate and regular rhythm.     Heart sounds: No murmur heard. Pulmonary:     Effort: Pulmonary effort is normal. No respiratory distress.     Breath sounds: Normal breath sounds. No wheezing, rhonchi or rales.  Musculoskeletal:     Cervical back: Neck supple.     Right lower leg: No edema.     Left lower leg: No edema.  Lymphadenopathy:     Cervical: No cervical adenopathy.  Skin:    Findings: No rash.  Neurological:     Mental Status: She is alert and oriented to person, place, and time. Mental status is at baseline.     Motor: No weakness.     Coordination: Coordination normal.     Gait: Gait normal.  Psychiatric:        Mood and Affect: Mood normal.        Behavior: Behavior normal.        Thought Content: Thought content normal.        Judgment: Judgment normal.      No results found. No results found. No results found for this or any previous visit (from the past 24 hours).  Assessment/Plan: Rita Levine is a 73 y.o. female present for OV for  Rhinosinusitis/postnasal drip No signs of esophageal  swelling or dyspnea.  Suspect patient's symptoms of swelling are mucus production draining into her throat throughout the night.  Her symptoms are worse in the morning improves throughout the day. Continue allergy and inhaler regimen. IM Depo-Medrol  injection 80 mg provided today at patient request. Encouraged nasal lavage 2 times a day Follow-up as needed  Reviewed expectations re: course of current medical issues. Discussed self-management of symptoms. Outlined signs and symptoms indicating need for more acute intervention. Patient verbalized understanding and all questions were answered. Patient received an After-Visit Summary.    No orders of the defined types were placed in this encounter.  No orders of the defined types were placed in this encounter.  Referral Orders  No referral(s) requested today     Note is dictated utilizing voice recognition software. Although note has been proof read prior to signing, occasional typographical errors still can be missed. If any questions arise, please do not hesitate to call for verification.   electronically signed by:  Charlies Bellini, DO  [facility] - OR

## 2024-07-19 NOTE — Patient Instructions (Signed)

## 2024-07-22 ENCOUNTER — Ambulatory Visit: Admitting: Nurse Practitioner

## 2024-07-26 ENCOUNTER — Ambulatory Visit: Admitting: Nurse Practitioner

## 2024-07-26 ENCOUNTER — Encounter: Payer: Self-pay | Admitting: Nurse Practitioner

## 2024-07-26 VITALS — BP 112/64 | HR 68 | Temp 98.4°F | Ht 59.0 in | Wt 128.0 lb

## 2024-07-26 DIAGNOSIS — J019 Acute sinusitis, unspecified: Secondary | ICD-10-CM

## 2024-07-26 DIAGNOSIS — J302 Other seasonal allergic rhinitis: Secondary | ICD-10-CM | POA: Diagnosis not present

## 2024-07-26 DIAGNOSIS — J4540 Moderate persistent asthma, uncomplicated: Secondary | ICD-10-CM | POA: Diagnosis not present

## 2024-07-26 DIAGNOSIS — B9689 Other specified bacterial agents as the cause of diseases classified elsewhere: Secondary | ICD-10-CM

## 2024-07-26 LAB — POCT EXHALED NITRIC OXIDE: FeNO level (ppb): 7

## 2024-07-26 MED ORDER — PREDNISONE 10 MG PO TABS: ORAL_TABLET | ORAL | 0 refills | Status: DC

## 2024-07-26 MED ORDER — AMOXICILLIN 875 MG PO TABS: 875.0000 mg | ORAL_TABLET | Freq: Two times a day (BID) | ORAL | 0 refills | Status: AC

## 2024-07-26 NOTE — Patient Instructions (Addendum)
-  Continue Advair 1 puffs Twice daily. Brush tongue and rinse mouth afterwards. Use with spacer.  -Continue albuterol  inhaler 2 puffs or 3 mL nebulizer every 6 hours as needed for shortness of breath or cough -Continue sinuglair 10 mg At bedtime -Continue protonix  40 mg Twice daily -Continue pepcid  10 mg daily  -Continue Xyzal  5 mg At bedtime  -Continue nasal irrigation treatments per ENT -Continue flonase  2 sprays each nostril Twice daily for nasal congestion/drainage  -Continue Chlortab 4 mg over the counter At bedtime for cough -Continue mucinex over the counter    Amoxicillin  875 mg Twice daily for 7 days. Take with food  Prednisone  taper. 4 tabs for 2 days, then 3 tabs for 2 days, 2 tabs for 2 days, then 1 tab for 2 days, then stop. Take in AM with food Over the counter guaifenesin [id number] mg Twice daily as needed for congestion/drainage  Referral to allergy   Follow up in 3 months with Dr. Darlean or Izetta Malachy PIETY. If symptoms worsen, please contact office for sooner follow up or seek emergency care.

## 2024-07-26 NOTE — Progress Notes (Signed)
 @Patient  ID: Rita Levine, female    DOB: September 29, 1951, 73 y.o.   MRN: [id number]  Chief Complaint  Patient presents with   Follow-up    Asthma and cough f/u States she is doing a little worse over a couple of weeks. Pt states she is having more SOB and dry coughing,     Referring provider: Catherine Charlies LABOR, DO  HPI: 73 year old female, never smoker followed for hx of asthma, chronic cough.  She is a patient of Dr. Chari and was last seen in office on 11/27/2023 by [facility] NP.  Past medical history significant for hypertension, aortic atherosclerosis, CAD, pulmonary hypertension, GERD, IBS, hypothyroidism, HLD, fibromyalgia, allergic rhinitis  TEST/EVENTS:  01/15/2021 CT chest with contrast: moderate aortic atherosclerosis. Occasional coronary artery calcifications. Tiny hiatal hernia. Linear atelectasis/scar in the lingula, similar to prior exam. 3 mm pleural based nodule in the right upper lobe, likely benign. 04/28/2021 CXR 1 view: Lung volumes normal.  Pulmonary vasculature and the cardiomediastinal silhouette are within normal limits. 10/25/2021: Allergen panel negative, eos 100 05/30/2022 PFT: FVC 93, FEV1 115, ratio 94, TLC 88, DLCOcor 117. No BD 06/03/2022 CT chest w con: subcm nodes in mediastinum with no significant change. 3 mm pleural based nodule in RUL with no significant change. No acute process. Fatty infiltration of the liver. Atherosclerosis.  06/03/2023 CTA chest outside facility: no PE. B/l lung infiltrate; possible COVID pna.  12/11/2023 CT chest: no acute abnormality in the lungs. CAD/atherosclerosis  08/02/2021: OV with Dr. Darlean.  Initial consult for chronic cough since having pneumonia in 2019.  No report of dyspnea.  Was previously placed on Symbicort  80 and was also using Saba twice a day.  Cough worse with Symbicort  -suspected upper airway cough syndrome.  Recommended stopping Symbicort  on trial basis.  Aggressive treatment of GERD with Protonix  40 mg twice a day and Pepcid   20 mg in the evening.  Albuterol  and budesonide  nebs up to twice a day if needed.  6-day prednisone  taper.  09/12/2021: OV with Justin Buechner NP for 6-week follow-up.  Did not notice major change in her symptoms.  She did experience some shortness of breath and chest tightness after burning some trash in her yard and working in the attic earlier in the week.  Felt some improvement but had not returned to baseline.  Persistent cough with primarily clear sputum production.  Allergy symptoms uncontrolled and currently being treated by ENT.  Felt GERD symptoms improved with twice daily PPI.  Increased ICS budesonide  nebs to 0.5 mg.  Prednisone  taper.  10/25/2021: OV with Mattilyn Crites NP for 6 week follow up. She continues to experience persistent cough with clear sputum production. She occasionally experiences minimal shortness of breath upon exertion and with coughing spells. She continues to have post nasal drip and difficulties with her chronic sinusitis which she is scheduled to follow up with ENT this week to discuss. She also notes occasional breakthrough reflux, despite Twice daily PPI and pepcid  nightly. She elevates the head of her bed and avoids triggering foods. She did notice some improvement in her symptoms with the prednisone  taper and reports that her albuterol  rescue inhaler does relieve her shortness of breath. FeNO nl. Persistent productive cough and minimal SOB with exertion. Suspect uncontrolled chronic sinusitis and GERD contributing. Hx of asthma - spirometry 2012 normal; no reports of wheezing. Slight improvement with prednisone . Trial triple therapy inhaler with Breztri  2 puffs Twice daily. Stop nebs. PRN albuterol . Instructed to follow up with ENT as scheduled. Advised  to follow up with GI to discuss breakthrough GERD as she is already on Twice daily PPI and H2 once daily - may need EGD for further eval. Continue Singulair , Xyzal , saline nasal rinses. PFTs ordered today. Allergen panel and CBC with diff.    05/08/2022: OV with Adeyemi Hamad NP for overdue follow-up.  She is still having a persistent chronic cough, which is possibly slightly worse than it was previously.  She has not noticed a huge change..  Usually it is nonproductive but occasionally she will produce clear to yellow sputum, which is usually in the morning.  Breathing is unchanged ; will get short of breath with strenuous activity or climbing stairs.  She also has persistent hoarseness, which she feels like is worse than it has been in the past.  She has chronic sinusitis and uses multiple nasal rinses.  She was seen by ENT after her last visit but only discussed a spot on her tongue and did not discuss her hoarseness or her persistent chronic sinusitis.  Feels like her GERD symptoms are better controlled.  She denies any wheezing, increased shortness of breath, orthopnea, PND, hemoptysis, weight loss, anorexia, fatigue, dysphagia.  She was previously trialed on step up to Breztri .  Did not notice a huge difference when back to low-dose Symbicort .  Uses this with a spacer.  Never completed her PFTs because she did not want to get COVID tested again.  Her PCP has ordered a CT chest for evaluation of the previously identified small nodule. CXR with bronchitic changes - treated with doxy and prednisone .  05/30/2022: OV with Kalisi Bevill NP for follow up after undergoing pulmonary function testing, which was overall normal. She reports she has been doing well since I saw her last. Still has an occasional dry cough but it is stable. Her breathing is unchanged. Unsure if the last course of prednisone  and antibiotics did much for her. She denies any recent wheezing, chest congestion, fevers, night seats, hemoptysis, anorexia, weight loss. She is on Symbicort  Twice daily. Rarely uses albuterol . She still has some issues with postnasal drainage and nasal congestion. Due to see ENT next week. She uses flonase , xyzal , and singulair . Does nasal irrigations daily. Denies any  headaches or purulent drainage. She has CT chest ordered by her PCP for nodule follow up next week.   12/02/2022: Ov with Ani Deoliveira NP for intended follow up; however, she is also having some acute symptoms which started around a month ago. She has had a lot of sinus congestion with green mucus. She's been coughing more frequently, mostly dry during the day but productive in the AM with similar colored sputum. She feels like she's had more chest tightness, wheezing, and has been getting a little more winded. She denies fevers, chills, hemoptysis, leg swelling, facial tenderness She's using her rescue inhaler multiple times a week, which does help. She is still using Symbicort  twice daily, singulai, Xyzal , and nasal irrigation/sprays as directed by ENT.  04/14/2023: Ov with Catheryn Slifer NP for follow up. She has been doing well since she was here last for the most part. The heat gives her a little more trouble but that's typical for this time of year. Her cough is at her baseline, primarily dry. She has some occasional voice hoarseness, which has not changed. No wheezing or chest congestion.   11/27/2023: Rita Levine with Trevia Nop NP Patient presents today for overdue follow-up.  Unfortunately, since she was here last her husband passed away.  They both had COVID in August 2024  and that he ended up becoming very sick following this.  He did have a history of lung disease related to agent orange exposure.  He passed around a month later.  She also had COVID pneumonia at the time but fortunately never had to be hospitalized.  Feels like she has recovered well and is back to her baseline.  Not having any issues with her cough.  Minimal with clear phlegm.  Does sometimes increase with postnasal drainage.  Breathing feels like it is at her baseline.  Not noticing any increase shortness of breath.  No wheezing or chest congestion.  No fevers, chills, hemoptysis.  Never had follow-up imaging following COVID illness.  Not having to use her rescue  inhaler.  Using Wixela twice a day.  Continues on her allergy and GERD regimen, which feel well-controlled.  07/26/2024: Today - follow up Discussed the use of AI scribe software for clinical note transcription with the patient, who gave verbal consent to proceed.  History of Present Illness Rita Levine is a 73 year old female with asthma who presents with increased coughing and throat constriction.  She has experienced an increase in coughing over the last few weeks. She's had a lot more sinus congestion and drainage recently. She has been using her nasal sprays/washes, and allergy regimen. She received a shot of Depomedrol last Monday from her PCP, which has provided some relief. She denies taking specific medication for the cough.   She has a history of allergy testing, including a prick test, which indicated sensitivity but no specific allergies per her report. She feels sensitive to many things despite the test outcomes and is interested in further allergy evaluation.  Her current asthma regimen includes Wixela, which she uses one puff twice a day. She uses albuterol  once or twice a month.   She reports hoarseness and sinus drainage, greenish in color when she does the flushes, and facial tenderness. No recent antibiotic use and is allergic to several antibiotics, typically using amoxicillin  or Augmentin  when necessary.  No fevers, chills, hemoptysis, wheezing.   FeNO 7 ppb  Allergies  Allergen Reactions   Paxlovid  [Nirmatrelvir -Ritonavir ] Shortness Of Breath and Palpitations   Tetanus Toxoid Swelling    Reports a fever and headache with swelling.    Avelox [Moxifloxacin Hcl In Nacl] Hives    Racing heart   Cephalexin Hives   Codeine Hives    Heart racing   Cymbalta  [Duloxetine  Hcl] Other (See Comments)    Increased tremors   Erythromycin Hives   Propranolol Hives   Sulfa Antibiotics Hives   Sulfamethoxazole-Trimethoprim Hives   Tramadol Hives   Flagyl [Metronidazole]  Other (See Comments)    Unknown reaction, unknown severity   Statins Other (See Comments)    Myalgia     Immunization History  Administered Date(s) Administered   Influenza-Unspecified 06/11/2019, 07/14/2020   Pneumococcal Conjugate-13 07/03/2016   Pneumococcal Polysaccharide-23 08/03/2019   Tdap 05/02/2014   Zoster, Live 10/14/2010    Past Medical History:  Diagnosis Date   Abdominal bloating 03/25/2024   Acne 06/02/2020   Allergic rhinitis    Allergic rhinitis due to pollen 12/03/2019   Allergy    Anemia    Asthma    Cataract    Chest pain, unspecified 04/07/2014   Chicken pox    Colon polyp    Combined forms of age-related cataract of left eye 05/06/2024   Combined forms of age-related cataract of right eye 03/04/2024   Diverticulosis    Dysphagia  Dysrhythmia    Palpitations   Fibromyalgia    Food allergy    Gallstone    Generalized abdominal pain 02/20/2023   GERD (gastroesophageal reflux disease)    Globus sensation 03/25/2024   Head movements abnormal 10/16/2020   Hiatal hernia    Hypercholesteremia    Hypertension    Hypokalemia 06/23/2023   Hypothyroidism    IBS (irritable bowel syndrome)    Ingrown toenail 08/23/2020   Migraine headache    occasionally   Morton neuroma, 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   Multiple food allergies 12/03/2019   Osteoarthritis    Osteopenia    Pneumonia    Pneumonia due to COVID-19 virus 06/23/2023   PONV (postoperative nausea and vomiting)    Pulmonary hypertension (HCC)    pt denies, pt states she was tested for it but was not diagnosed with it   Pulmonary nodule-3 mm right upper lobe nodule-stable; no further evaluation required 04/24/2022   Rectal bleeding    Regular astigmatism of left eye 05/06/2024   Regular astigmatism of right eye 03/04/2024   SBO (small bowel obstruction) (HCC) 10/20/2017   Seasonal allergies    Seborrheic dermatitis of scalp 06/02/2020   Subconjunctival hemorrhage of left eye 08/01/2022   Thyroid   nodule    Trigger Levine of left and right ring fingers 01/11/2021    Tobacco History: Social History   Tobacco Use  Smoking Status Never   Passive exposure: Past  Smokeless Tobacco Never   Counseling given: Not Answered   Outpatient Medications Prior to Visit  Medication Sig Dispense Refill   albuterol  (PROAIR  HFA) 108 (90 Base) MCG/ACT inhaler Inhale 2 puffs into the lungs every 6 (six) hours as needed. 6.7 g 5   albuterol  (PROVENTIL ) (2.5 MG/3ML) 0.083% nebulizer solution Take 3 mLs (2.5 mg total) by nebulization every 6 (six) hours as needed. 75 mL 0   ARMOUR THYROID  15 MG tablet Take 1 tablet by mouth in the AM Once a day 90 tablet 3   ARMOUR THYROID  30 MG tablet Take 1 tablet by mouth in the AM once a day 90 tablet 3   Ascorbic Acid (VITAMIN C) 1000 MG tablet Take 3,000 mg by mouth daily.     atenolol  (TENORMIN ) 25 MG tablet Take 0.5-1 tablets (12.5-25 mg total) by mouth daily. 90 tablet 1   budesonide  (PULMICORT ) 0.5 MG/2ML nebulizer solution TAKE 2 MLS (0.5 MG TOTAL) BY NEBULIZATION IN THE MORNING AND AT BEDTIME. 360 mL 2   Carboxymethylcellulose Sodium (THERATEARS OP) Place 1 drop into both eyes 2 (two) times daily.     Coenzyme Q10 (COQ-10) 100 MG CAPS Take 100 mg by mouth daily.     Evolocumab  (REPATHA  SURECLICK) 140 MG/ML SOAJ Inject 140 mg into the skin every 14 (fourteen) days. 6 mL 3   famotidine  (PEPCID ) 40 MG tablet Take 1 tablet (40 mg total) by mouth 2 (two) times daily. 180 tablet 1   fexofenadine  (ALLERGY RELIEF) 180 MG tablet Take 1 tablet (180 mg total) by mouth daily. 90 tablet 1   fluticasone  (FLONASE ) 50 MCG/ACT nasal spray Place 2 sprays into both nostrils daily. 16 mL 11   fluticasone -salmeterol (WIXELA INHUB) 250-50 MCG/ACT AEPB Inhale 1 puff into the lungs 2 (two) times daily. in the morning and at bedtime. 60 each 5   folic acid (FOLVITE) 1 MG tablet SMARTSIG:2 Tablet(s) By Mouth     hydrochlorothiazide  (MICROZIDE ) 12.5 MG capsule Take 1 capsule (12.5 mg  total) by mouth daily as needed. 90 capsule 1  hydroxychloroquine  (PLAQUENIL ) 200 MG tablet 1 tablet p.o. twice a day for a week then 1 tab p.o. daily 60 tablet 3   hyoscyamine  (LEVSIN ) 0.125 MG tablet Take 1 tablet (0.125 mg total) by mouth every 6 (six) hours as needed. 120 tablet 5   LORazepam  (ATIVAN ) 0.5 MG tablet Take 1 tablet (0.5 mg total) by mouth 2 (two) times daily as needed for anxiety. 30 tablet 5   mometasone (ELOCON) 0.1 % lotion Apply 1 application topically daily as needed (ear irritation).      montelukast  (SINGULAIR ) 10 MG tablet TAKE 1 TABLET BY MOUTH EVERYDAY AT BEDTIME 90 tablet 1   ondansetron  (ZOFRAN -ODT) 8 MG disintegrating tablet TAKE 1 TABLET BY MOUTH EVERY 8 HOURS AS NEEDED FOR NAUSEA OR VOMITING. 30 tablet 1   Probiotic CAPS Take 1 capsule by mouth daily.     tiZANidine  (ZANAFLEX ) 4 MG tablet Take 1 tablet (4 mg total) by mouth every 6 (six) hours as needed for muscle spasms. 120 tablet 5   traZODone  (DESYREL ) 50 MG tablet Take 0.5-2 tablets (25-100 mg total) by mouth at bedtime as needed for sleep. 180 tablet 1   Vonoprazan Fumarate  (VOQUEZNA ) 20 MG TABS Take 20 mg by mouth daily. 30 tablet 1   zinc gluconate 50 MG tablet Take 100 mg by mouth daily.     No facility-administered medications prior to visit.     Review of Systems: as above    Physical Exam:  BP 112/64   Pulse 68   Temp 98.4 F (36.9 C)   Ht 4' 11 (1.499 m)   Wt 128 lb (58.1 kg)   SpO2 97% Comment: RA  BMI 25.85 kg/m   GEN: Pleasant, interactive, well-nourished; in no acute distress. HEENT:  Normocephalic and atraumatic. EACs patent bilaterally. TM pearly gray with present light reflex bilaterally. PERRLA. Sclera white. Nasal turbinates erythematous, moist and patent bilaterally.  Right nasal polyp (chronic).  No rhinorrhea present. Oropharynx erythematous and moist, without exudate or edema. No lesions, ulcerations NECK:  Supple w/ fair ROM. Cervical LAD CV: RRR, no m/r/g, no  peripheral edema. Pulses intact, +2 bilaterally. No cyanosis, pallor or clubbing. PULMONARY:  Unlabored, regular breathing. Clear bilaterally A&P w/o wheezes/rales/rhonchi. No accessory muscle use. No dullness to percussion. GI: BS present and normoactive. Soft, non-tender to palpation.  MSK: No erythema, warmth or tenderness.  Neuro: A/Ox3. No focal deficits noted.   Skin: Warm, no lesions or rashe Psych: Normal affect and behavior. Judgement and thought content appropriate.     Lab Results:  CBC    Component Value Date/Time   WBC 6.1 10/22/2023 0953   RBC 4.80 10/22/2023 0953   HGB 13.7 10/22/2023 0953   HCT 41.8 10/22/2023 0953   PLT 288.0 10/22/2023 0953   MCV 87.1 10/22/2023 0953   MCH 28.3 04/28/2021 0934   MCHC 32.7 10/22/2023 0953   RDW 13.3 10/22/2023 0953   LYMPHSABS 1.6 04/29/2023 1020   MONOABS 0.7 04/29/2023 1020   EOSABS 0.1 04/29/2023 1020   BASOSABS 0.1 04/29/2023 1020    BMET    Component Value Date/Time   NA 140 03/25/2024 0855   K 4.0 03/25/2024 0855   CL 98 03/25/2024 0855   CO2 26 03/25/2024 0855   GLUCOSE 103 (H) 03/25/2024 0855   GLUCOSE 105 (H) 10/22/2023 0953   BUN 8 03/25/2024 0855   CREATININE 0.61 03/25/2024 0855   CREATININE 0.71 07/26/2022 1641   CALCIUM  9.5 03/25/2024 0855   GFRNONAA >60 04/28/2021 9065  BNP No results found for: BNP   Imaging:  No results found.   methylPREDNISolone  acetate (DEPO-MEDROL ) injection 80 mg     Date Action Dose Route User   07/19/2024 1144 Given 80 mg Intramuscular (Left Ventrogluteal) Craiger, Chloe A, CMA          Latest Ref Rng & Units 05/30/2022    9:59 AM  PFT Results  FVC-Pre L 2.22   FVC-Predicted Pre % 93   FVC-Post L 2.30   FVC-Predicted Post % 96   Pre FEV1/FVC % % 93   Post FEV1/FCV % % 94   FEV1-Pre L 2.05   FEV1-Predicted Pre % 115   FEV1-Post L 2.15   DLCO uncorrected ml/min/mmHg 18.86   DLCO UNC% % 116   DLCO corrected ml/min/mmHg 19.03   DLCO COR %Predicted %  117   DLVA Predicted % 123   TLC L 3.80   TLC % Predicted % 88   RV % Predicted % 68     No results found for: NITRICOXIDE      Assessment & Plan:   Assessment & Plan Acute bacterial sinusitis Acute bacterial sinusitis. Failure to improve with supportive care. Suspect cough is related to postnasal drainage.  - Prescribe amoxicillin  875 mg twice a day for 7 days - Prescribe oral steroids to reduce upper airway inflammation. - Supportive care with sinus symptoms   Moderate persistent asthma Asthma is well-controlled with current medication regimen. She reports using albuterol  inhaler once or twice a month and is on Wixela one puff twice daily. Exhaled nitric oxide  testing normal. Doesn't appear to be in acute exacerbation. Action plan in place.  - Continue Wixela one puff twice daily. - Continue albuterol  inhaler as needed for acute symptoms.  Allergic rhinitis Allergic rhinitis contributing to postnasal drip and cough, exacerbated by ragweed season. Current management includes nasal sprays and allergy medications. - Refer to Dr. Iva, allergist, for further evaluation and management. - Continue current allergy medications and nasal sprays.       Comer Rita Rouleau, NP 07/26/2024  Pt aware and understands NP's role.

## 2024-07-29 DIAGNOSIS — Z85828 Personal history of other malignant neoplasm of skin: Secondary | ICD-10-CM | POA: Diagnosis not present

## 2024-07-29 DIAGNOSIS — L820 Inflamed seborrheic keratosis: Secondary | ICD-10-CM | POA: Diagnosis not present

## 2024-07-29 DIAGNOSIS — B0089 Other herpesviral infection: Secondary | ICD-10-CM | POA: Diagnosis not present

## 2024-07-29 DIAGNOSIS — L738 Other specified follicular disorders: Secondary | ICD-10-CM | POA: Diagnosis not present

## 2024-07-29 DIAGNOSIS — L821 Other seborrheic keratosis: Secondary | ICD-10-CM | POA: Diagnosis not present

## 2024-08-01 ENCOUNTER — Encounter: Payer: Self-pay | Admitting: Nurse Practitioner

## 2024-08-01 DIAGNOSIS — B9689 Other specified bacterial agents as the cause of diseases classified elsewhere: Secondary | ICD-10-CM

## 2024-08-02 MED ORDER — AMOXICILLIN-POT CLAVULANATE 875-125 MG PO TABS: 1.0000 | ORAL_TABLET | Freq: Two times a day (BID) | ORAL | 0 refills | Status: AC

## 2024-08-02 NOTE — Addendum Note (Signed)
 Addended by: Rhydian Baldi V on: 08/02/2024 03:45 PM   Modules accepted: Orders

## 2024-08-02 NOTE — Telephone Encounter (Signed)
 Copied from CRM #[id number]. Topic: General - Other >> Aug 02, 2024  9:48 AM Rilla NOVAK wrote: Reason for CRM: Patient saw K Cobb recently and was told if she did not get any better to call in for a prescription.  Patient states she is coughing ageing and would like medication.  Please call patient @ [phone]

## 2024-08-02 NOTE — Telephone Encounter (Deleted)
 cobb

## 2024-08-02 NOTE — Telephone Encounter (Signed)
 So sorry, I misread that.  I sent in augmentin . Thanks.

## 2024-08-02 NOTE — Telephone Encounter (Signed)
 Was she feeling better on higher doses of prednisone ? She's been treated with augmentin  and prednisone  already

## 2024-08-02 NOTE — Telephone Encounter (Signed)
Katie please advise 

## 2024-08-02 NOTE — Telephone Encounter (Signed)
 I sent in augmentin  at our last visit. Did she not get this?  Here is my last note Acute bacterial sinusitis. Failure to improve with supportive care. Suspect cough is related to postnasal drainage.  - Prescribe amoxicillin  875 mg twice a day for 7 days - Prescribe oral steroids to reduce upper airway inflammation. - Supportive care with sinus symptoms   Verified rx was sent.

## 2024-08-02 NOTE — Telephone Encounter (Signed)
**Note De-identified  [place] Obfuscation** Please advise 

## 2024-08-16 DIAGNOSIS — H04123 Dry eye syndrome of bilateral lacrimal glands: Secondary | ICD-10-CM | POA: Diagnosis not present

## 2024-08-19 ENCOUNTER — Ambulatory Visit: Payer: Self-pay

## 2024-08-19 NOTE — Telephone Encounter (Signed)
 FYI Only or Action Required?: FYI only for provider: appointment scheduled on 08/20/24.  Patient was last seen in primary care on 07/19/2024 by Catherine Fuller A, DO.  Called Nurse Triage reporting Vaginitis.  Symptoms began several days ago.  Interventions attempted: OTC medications: OTC yeast infection tx.  Symptoms are: gradually worsening.  Triage Disposition: See Physician Within 24 Hours  Patient/caregiver understands and will follow disposition?: Yes  Reason for Disposition  [1] Finished taking antibiotics AND [2] symptoms are BETTER but [3] not completely gone  MODERATE-SEVERE itching (i.e., interferes with school, work, or sleep)  Answer Assessment - Initial Assessment Questions Pt states has had improvement since visit on 08/01/24 but continues to have post nasal drip and mild productive cough. Also reports new onset yeast infection symptoms of burning and pruritus, denies discharge. No improvement with OTC yeast infection tx. Completed amoxicillin  and then augmentin .  1. INFECTION: What infection is the antibiotic being given for?     Sinus infection  2. ANTIBIOTIC: What antibiotic are you taking How many times per day?     Amoxicillin  and then Augmentin   3. DURATION: When was the antibiotic started?     Completed both courses as prescribed  4. MAIN CONCERN OR SYMPTOM:  What is your main concern right now?     Yeast infection  Answer Assessment - Initial Assessment Questions Vaginitis after amoxicillin  and augmentin .  Protocols used: Infection on Antibiotic Follow-up Call-A-AH, Vaginal Symptoms-A-AH  Copied from CRM #[id number]. Topic: Clinical - Red Word Triage >> Aug 19, 2024 11:34 AM Thersia BROCKS wrote: Red Word that prompted transfer to Nurse Triage: Patient was seen with provider on 10/13 issues with coughing was worse, was seen by pulmonary and they address the issues was well. has been taking medication, but the mucus cough is still there and she nows have a  yeast infection itching, buring and redness  . stated all of it is related to the sinus infection

## 2024-08-20 ENCOUNTER — Encounter: Payer: Self-pay | Admitting: Family Medicine

## 2024-08-20 ENCOUNTER — Ambulatory Visit: Admitting: Family Medicine

## 2024-08-20 VITALS — BP 128/62 | HR 69 | Temp 98.2°F | Wt 130.0 lb

## 2024-08-20 DIAGNOSIS — B379 Candidiasis, unspecified: Secondary | ICD-10-CM | POA: Diagnosis not present

## 2024-08-20 DIAGNOSIS — R051 Acute cough: Secondary | ICD-10-CM | POA: Diagnosis not present

## 2024-08-20 DIAGNOSIS — R0982 Postnasal drip: Secondary | ICD-10-CM

## 2024-08-20 MED ORDER — FLUCONAZOLE 150 MG PO TABS: ORAL_TABLET | ORAL | 0 refills | Status: DC

## 2024-08-20 MED ORDER — IPRATROPIUM BROMIDE 0.06 % NA SOLN: 2.0000 | Freq: Four times a day (QID) | NASAL | 12 refills | Status: AC

## 2024-08-20 NOTE — Progress Notes (Signed)
 Rita Levine , 11-03-1950, 73 y.o., female MRN: [id number] Patient Care Team    Relationship Specialty Notifications Start End  Catherine Charlies LABOR, DO PCP - General Family Medicine  12/01/19   Dann Candyce RAMAN, MD PCP - Cardiology Cardiology  01/26/20   Teressa Toribio SQUIBB, MD (Inactive) Attending Physician Gastroenterology  12/01/19   Porter Andrez JONELLE DEVONNA (Inactive) Physician Assistant Dermatology  04/18/22   Geroge Iha, OD  Optometry  07/26/22     Chief Complaint  Patient presents with   Cough    Since last OV 10/6.      Subjective: Rita Levine is a 73 y.o. Pt presents for an OV with complaints of tickle cough in her throat. She endorses PND present, worse at night.  She is compliant with allergy  and inhaler use.  Allegra , singulair .  Prednisone  taper 10/13- by pulm amox/augm> reports she felt better, then pnd restarted with cough.   She also reports she has vaginal itching after abx use. She tried monistat OTC and it burned.      05/17/2024    9:08 AM 07/09/2023    2:12 PM 06/23/2023   11:54 AM 04/29/2023   10:07 AM 10/02/2022    8:21 AM  Depression screen PHQ 2/9  Decreased Interest 0 1 1 0 0  Down, Depressed, Hopeless 0 1 1 0 0  PHQ - 2 Score 0 2 2 0 0  Altered sleeping 1  2    Tired, decreased energy 1  3    Change in appetite 0  0    Feeling bad or failure about yourself  0  2    Trouble concentrating 0  2    Moving slowly or fidgety/restless 0  0    Suicidal thoughts 0 0 0    PHQ-9 Score 2   11     Difficult doing work/chores Not difficult at all Somewhat difficult Very difficult       Data saved with a previous flowsheet row definition    Allergies  Allergen Reactions   Paxlovid  [Nirmatrelvir -Ritonavir ] Shortness Of Breath and Palpitations   Tetanus Toxoid Swelling    Reports a fever and headache with swelling.    Avelox [Moxifloxacin Hcl In Nacl] Hives    Racing heart   Cephalexin Hives   Codeine Hives    Heart racing   Cymbalta  [Duloxetine   Hcl] Other (See Comments)    Increased tremors   Erythromycin Hives   Propranolol Hives   Sulfa Antibiotics Hives   Sulfamethoxazole-Trimethoprim Hives   Tramadol Hives   Flagyl [Metronidazole] Other (See Comments)    Unknown reaction, unknown severity   Statins Other (See Comments)    Myalgia    Social History   Social History Narrative   Marital status/children/pets: married, 2 children   Education/employment: HS grad. retired   Field Seismologist:      -smoke alarm in the home:Yes     - wears seatbelt: Yes     - Feels safe in their relationships: Yes   Past Medical History:  Diagnosis Date   Abdominal bloating 03/25/2024   Acne 06/02/2020   Allergic rhinitis    Allergic rhinitis due to pollen 12/03/2019   Allergy    Anemia    Asthma    Cataract    Chest pain, unspecified 04/07/2014   Chicken pox    Colon polyp    Combined forms of age-related cataract of left eye 05/06/2024   Combined forms of  age-related cataract of right eye 03/04/2024   Diverticulosis    Dysphagia    Dysrhythmia    Palpitations   Fibromyalgia    Food allergy    Gallstone    Generalized abdominal pain 02/20/2023   GERD (gastroesophageal reflux disease)    Globus sensation 03/25/2024   Head movements abnormal 10/16/2020   Hiatal hernia    Hypercholesteremia    Hypertension    Hypokalemia 06/23/2023   Hypothyroidism    IBS (irritable bowel syndrome)    Ingrown toenail 08/23/2020   Migraine headache    occasionally   Morton neuroma, 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   Multiple food allergies 12/03/2019   Osteoarthritis    Osteopenia    Pneumonia    Pneumonia due to COVID-19 virus 06/23/2023   PONV (postoperative nausea and vomiting)    Pulmonary hypertension (HCC)    pt denies, pt states she was tested for it but was not diagnosed with it   Pulmonary nodule-3 mm right upper lobe nodule-stable; no further evaluation required 04/24/2022   Rectal bleeding    Regular astigmatism of left eye 05/06/2024   Regular  astigmatism of right eye 03/04/2024   SBO (small bowel obstruction) (HCC) 10/20/2017   Seasonal allergies    Seborrheic dermatitis of scalp 06/02/2020   Subconjunctival hemorrhage of left eye 08/01/2022   Thyroid  nodule    Trigger finger of left and right ring fingers 01/11/2021   Past Surgical History:  Procedure Laterality Date   APPENDECTOMY  1982   BREAST EXCISIONAL BIOPSY Right 1995   benign   BREAST LUMPECTOMY  1991   CARPAL TUNNEL RELEASE Right 2006   Right wrist   CATHETER REMOVAL     CESAREAN SECTION     x 2   CHOLECYSTECTOMY N/A 07/25/2020   Procedure: LAPAROSCOPIC CHOLECYSTECTOMY;  Surgeon: Aron Shoulders, MD;  Location: [facility] OR;  Service: General;  Laterality: N/A;   COLON RESECTION  1990   COLONOSCOPY     CT ABDOMEN PELVIS W ([facility] HX)  01/03/2020   IMAGE MRI brain:  04/2012   No focal IAC or inner ear lesion to explain hearing loss. Slightly greater than expected number of subcortical T2- hyperintensities bilaterally.  These are nonspecific.  They can be seen in the setting of chronic migraine headaches, demyelinating process, chronic microvascular ischemic, prior infection or inflammation, or vasculitis   IMAGE MRI lumbar:  05/01/2006   Mild central canal stenosis with facet arthropathy and mildly bulging disc at L4-5.  There is mild narrowing in the left lateral recess at this level.  No definite neural impingement.  Appearance at this level has not markedly Moderately severe facet arthropathy at L5-S1 with mild interval progression of a diffuse broad based disc bulge.  There is mild biforaminal narrowing.  Central canal is open   LAPAROSCOPIC ABDOMINAL EXPLORATION  2019   adhesion removal> caused bowel blockage    NASAL SEPTUM SURGERY  2004   TONSILLECTOMY  1965   TOTAL ABDOMINAL HYSTERECTOMY W/ BILATERAL SALPINGOOPHORECTOMY  1988   TUBAL LIGATION  1974   UPPER GASTROINTESTINAL ENDOSCOPY  2020   Family History  Problem Relation Age of Onset   Hyperlipidemia Father     Hypertension Father    Arthritis Father    Diabetes Father    Asthma Father    COPD Father    Early death Father    Parkinson's disease Father    Hypertension Mother    Hyperlipidemia Mother    Macular degeneration Mother    Arthritis  Mother    Hearing loss Mother    Heart disease Mother    Kidney disease Mother    Throat cancer Brother        lung, and tongue   Arthritis Brother    COPD Brother    Diabetes type II Sister    COPD Sister    Heart disease Sister    Hypertension Sister    Thyroid  cancer Sister    Lung cancer Sister    Early death Sister    COPD Sister    Breast cancer Maternal Aunt    Lung cancer Other        uncle   Emphysema Maternal Aunt    Emphysema Maternal Uncle    Clotting disorder Sister    Brain cancer Sister        brain tumor- not malignant   Breast cancer Cousin    Cancer Sister    Alcohol abuse Sister    COPD Sister    Early death Sister    Alcohol abuse Brother    Arthritis Brother    Depression Brother    Diabetes Brother    Hypercholesterolemia Brother    Colon cancer Neg Hx    Esophageal cancer Neg Hx    Rectal cancer Neg Hx    Stomach cancer Neg Hx    Allergies as of 08/20/2024       Reactions   Paxlovid  [nirmatrelvir -ritonavir ] Shortness Of Breath, Palpitations   Tetanus Toxoid Swelling   Reports a fever and headache with swelling.    Avelox [moxifloxacin Hcl In Nacl] Hives   Racing heart   Cephalexin Hives   Codeine Hives   Heart racing   Cymbalta  [duloxetine  Hcl] Other (See Comments)   Increased tremors   Erythromycin Hives   Propranolol Hives   Sulfa Antibiotics Hives   Sulfamethoxazole-trimethoprim Hives   Tramadol Hives   Flagyl [metronidazole] Other (See Comments)   Unknown reaction, unknown severity   Statins Other (See Comments)   Myalgia        Medication List        Accurate as of August 20, 2024 11:44 AM. If you have any questions, ask your nurse or doctor.          STOP taking these  medications    predniSONE  10 MG tablet Commonly known as: DELTASONE  Stopped by: Charlies Bellini       TAKE these medications    albuterol  108 (90 Base) MCG/ACT inhaler Commonly known as: ProAir  HFA Inhale 2 puffs into the lungs every 6 (six) hours as needed.   albuterol  (2.5 MG/3ML) 0.083% nebulizer solution Commonly known as: PROVENTIL  Take 3 mLs (2.5 mg total) by nebulization every 6 (six) hours as needed.   Armour Thyroid  30 MG tablet Generic drug: thyroid  Take 1 tablet by mouth in the AM once a day   Armour Thyroid  15 MG tablet Generic drug: thyroid  Take 1 tablet by mouth in the AM Once a day   atenolol  25 MG tablet Commonly known as: TENORMIN  Take 0.5-1 tablets (12.5-25 mg total) by mouth daily.   budesonide  0.5 MG/2ML nebulizer solution Commonly known as: PULMICORT  TAKE 2 MLS (0.5 MG TOTAL) BY NEBULIZATION IN THE MORNING AND AT BEDTIME.   CoQ-10 100 MG Caps Take 100 mg by mouth daily.   famotidine  40 MG tablet Commonly known as: PEPCID  Take 1 tablet (40 mg total) by mouth 2 (two) times daily.   fexofenadine  180 MG tablet Commonly known as: Allergy Relief Take 1 tablet (  180 mg total) by mouth daily.   fluconazole  150 MG tablet Commonly known as: DIFLUCAN  1 tab PO today, repeat in 1 week if needed Started by: Charlies Bellini   fluticasone  50 MCG/ACT nasal spray Commonly known as: FLONASE  Place 2 sprays into both nostrils daily.   fluticasone -salmeterol 250-50 MCG/ACT Aepb Commonly known as: Wixela Inhub Inhale 1 puff into the lungs 2 (two) times daily. in the morning and at bedtime.   folic acid 1 MG tablet Commonly known as: FOLVITE SMARTSIG:2 Tablet(s) By Mouth   hydrochlorothiazide  12.5 MG capsule Commonly known as: MICROZIDE  Take 1 capsule (12.5 mg total) by mouth daily as needed.   hydroxychloroquine  200 MG tablet Commonly known as: Plaquenil  1 tablet p.o. twice a day for a week then 1 tab p.o. daily   hyoscyamine  0.125 MG tablet Commonly  known as: LEVSIN  Take 1 tablet (0.125 mg total) by mouth every 6 (six) hours as needed.   ipratropium 0.06 % nasal spray Commonly known as: ATROVENT Place 2 sprays into both nostrils 4 (four) times daily. Started by: Amarah Brossman   LORazepam  0.5 MG tablet Commonly known as: ATIVAN  Take 1 tablet (0.5 mg total) by mouth 2 (two) times daily as needed for anxiety.   mometasone 0.1 % lotion Commonly known as: ELOCON Apply 1 application topically daily as needed (ear irritation).   montelukast  10 MG tablet Commonly known as: SINGULAIR  TAKE 1 TABLET BY MOUTH EVERYDAY AT BEDTIME   ondansetron  8 MG disintegrating tablet Commonly known as: ZOFRAN -ODT TAKE 1 TABLET BY MOUTH EVERY 8 HOURS AS NEEDED FOR NAUSEA OR VOMITING.   Probiotic Caps Take 1 capsule by mouth daily.   Repatha  SureClick 140 MG/ML Soaj Generic drug: Evolocumab  Inject 140 mg into the skin every 14 (fourteen) days.   THERATEARS OP Place 1 drop into both eyes 2 (two) times daily.   tiZANidine  4 MG tablet Commonly known as: ZANAFLEX  Take 1 tablet (4 mg total) by mouth every 6 (six) hours as needed for muscle spasms.   traZODone  50 MG tablet Commonly known as: DESYREL  Take 0.5-2 tablets (25-100 mg total) by mouth at bedtime as needed for sleep.   vitamin C 1000 MG tablet Take 3,000 mg by mouth daily.   Voquezna  20 MG Tabs Generic drug: Vonoprazan Fumarate  Take 20 mg by mouth daily.   zinc gluconate 50 MG tablet Take 100 mg by mouth daily.        All past medical history, surgical history, allergies, family history, immunizations andmedications were updated in the EMR today and reviewed under the history and medication portions of their EMR.     ROS Negative, with the exception of above mentioned in HPI   Objective:  BP 128/62   Pulse 69   Temp 98.2 F (36.8 C)   Wt 130 lb (59 kg)   SpO2 97%   BMI 26.26 kg/m  Body mass index is 26.26 kg/m. Physical Exam Vitals and nursing note reviewed.   Constitutional:      General: She is not in acute distress.    Appearance: Normal appearance. She is normal weight. She is not ill-appearing or toxic-appearing.  HENT:     Head: Normocephalic and atraumatic.     Right Ear: Tympanic membrane, ear canal and external ear normal. There is no impacted cerumen.     Left Ear: Tympanic membrane, ear canal and external ear normal. There is no impacted cerumen.     Nose: No congestion or rhinorrhea.     Mouth/Throat:  Pharynx: No oropharyngeal exudate or posterior oropharyngeal erythema.     Comments: Mild PND present Eyes:     General: No scleral icterus.       Right eye: No discharge.        Left eye: No discharge.     Extraocular Movements: Extraocular movements intact.     Conjunctiva/sclera: Conjunctivae normal.     Pupils: Pupils are equal, round, and reactive to light.  Cardiovascular:     Rate and Rhythm: Normal rate and regular rhythm.     Heart sounds: No murmur heard. Pulmonary:     Effort: Pulmonary effort is normal. No respiratory distress.     Breath sounds: Normal breath sounds. No wheezing, rhonchi or rales.  Musculoskeletal:     Cervical back: Neck supple.  Skin:    Findings: No rash.  Neurological:     Mental Status: She is alert and oriented to person, place, and time. Mental status is at baseline.     Motor: No weakness.     Coordination: Coordination normal.     Gait: Gait normal.  Psychiatric:        Mood and Affect: Mood normal.        Behavior: Behavior normal.        Thought Content: Thought content normal.        Judgment: Judgment normal.     No results found. No results found. No results found for this or any previous visit (from the past 24 hours).  Assessment/Plan: Rita Levine is a 73 y.o. female present for OV for  Post-nasal drip (Primary)/Acute on chronic cough She does not appear infectious today.  There is a mild PND that is present, seems to be worse at night for her, which is causing  a tickle cough.  Continue allergy and inhaler regimen.  Added Atrovent nasal spray to her regimen today RTC PRN  Yeast infection Diflucan  prescribed  Reviewed expectations re: course of current medical issues. Discussed self-management of symptoms. Outlined signs and symptoms indicating need for more acute intervention. Patient verbalized understanding and all questions were answered. Patient received an After-Visit Summary.    No orders of the defined types were placed in this encounter.  Meds ordered this encounter  Medications   ipratropium (ATROVENT) 0.06 % nasal spray    Sig: Place 2 sprays into both nostrils 4 (four) times daily.    Dispense:  15 mL    Refill:  12   fluconazole  (DIFLUCAN ) 150 MG tablet    Sig: 1 tab PO today, repeat in 1 week if needed    Dispense:  2 tablet    Refill:  0   Referral Orders  No referral(s) requested today     Note is dictated utilizing voice recognition software. Although note has been proof read prior to signing, occasional typographical errors still can be missed. If any questions arise, please do not hesitate to call for verification.   electronically signed by:  Charlies Bellini, DO  [facility] - [place]

## 2024-08-20 NOTE — Patient Instructions (Addendum)

## 2024-08-25 ENCOUNTER — Ambulatory Visit

## 2024-08-25 VITALS — Ht 59.0 in | Wt 130.0 lb

## 2024-08-25 DIAGNOSIS — Z0000 Encounter for general adult medical examination without abnormal findings: Secondary | ICD-10-CM | POA: Diagnosis not present

## 2024-08-25 NOTE — Progress Notes (Signed)
 Chief Complaint  Patient presents with   Medicare Wellness     Subjective:   Rita Levine is a 73 y.o. female who presents for a Medicare Annual Wellness Visit.  Allergies (verified) Paxlovid  [nirmatrelvir -ritonavir ], Tetanus toxoid, Avelox [moxifloxacin hcl in nacl], Cephalexin, Codeine, Cymbalta  [duloxetine  hcl], Erythromycin, Propranolol, Sulfa antibiotics, Sulfamethoxazole-trimethoprim, Tramadol, Flagyl [metronidazole], and Statins   History: Past Medical History:  Diagnosis Date   Abdominal bloating 03/25/2024   Acne 06/02/2020   Allergic rhinitis    Allergic rhinitis due to pollen 12/03/2019   Allergy    Anemia    Asthma    Cataract    Chest pain, unspecified 04/07/2014   Chicken pox    Colon polyp    Combined forms of age-related cataract of left eye 05/06/2024   Combined forms of age-related cataract of right eye 03/04/2024   Diverticulosis    Dysphagia    Dysrhythmia    Palpitations   Fibromyalgia    Food allergy    Gallstone    Generalized abdominal pain 02/20/2023   GERD (gastroesophageal reflux disease)    Globus sensation 03/25/2024   Head movements abnormal 10/16/2020   Hiatal hernia    Hypercholesteremia    Hypertension    Hypokalemia 06/23/2023   Hypothyroidism    IBS (irritable bowel syndrome)    Ingrown toenail 08/23/2020   Migraine headache    occasionally   Morton neuroma, 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   Multiple food allergies 12/03/2019   Osteoarthritis    Osteopenia    Pneumonia    Pneumonia due to COVID-19 virus 06/23/2023   PONV (postoperative nausea and vomiting)    Pulmonary hypertension (HCC)    pt denies, pt states she was tested for it but was not diagnosed with it   Pulmonary nodule-3 mm right upper lobe nodule-stable; no further evaluation required 04/24/2022   Rectal bleeding    Regular astigmatism of left eye 05/06/2024   Regular astigmatism of right eye 03/04/2024   SBO (small bowel obstruction) (HCC) 10/20/2017   Seasonal  allergies    Seborrheic dermatitis of scalp 06/02/2020   Subconjunctival hemorrhage of left eye 08/01/2022   Thyroid  nodule    Trigger finger of left and right ring fingers 01/11/2021   Past Surgical History:  Procedure Laterality Date   APPENDECTOMY  1982   BREAST EXCISIONAL BIOPSY Right 1995   benign   BREAST LUMPECTOMY  1991   CARPAL TUNNEL RELEASE Right 2006   Right wrist   CATHETER REMOVAL     CESAREAN SECTION     x 2   CHOLECYSTECTOMY N/A 07/25/2020   Procedure: LAPAROSCOPIC CHOLECYSTECTOMY;  Surgeon: Aron Shoulders, MD;  Location: [facility] OR;  Service: General;  Laterality: N/A;   COLON RESECTION  1990   COLONOSCOPY     CT ABDOMEN PELVIS W ([facility] HX)  01/03/2020   IMAGE MRI brain:  04/2012   No focal IAC or inner ear lesion to explain hearing loss. Slightly greater than expected number of subcortical T2- hyperintensities bilaterally.  These are nonspecific.  They can be seen in the setting of chronic migraine headaches, demyelinating process, chronic microvascular ischemic, prior infection or inflammation, or vasculitis   IMAGE MRI lumbar:  05/01/2006   Mild central canal stenosis with facet arthropathy and mildly bulging disc at L4-5.  There is mild narrowing in the left lateral recess at this level.  No definite neural impingement.  Appearance at this level has not markedly Moderately severe facet arthropathy at L5-S1 with mild interval  progression of a diffuse broad based disc bulge.  There is mild biforaminal narrowing.  Central canal is open   LAPAROSCOPIC ABDOMINAL EXPLORATION  2019   adhesion removal> caused bowel blockage    NASAL SEPTUM SURGERY  2004   TONSILLECTOMY  1965   TOTAL ABDOMINAL HYSTERECTOMY W/ BILATERAL SALPINGOOPHORECTOMY  1988   TUBAL LIGATION  1974   UPPER GASTROINTESTINAL ENDOSCOPY  2020   Family History  Problem Relation Age of Onset   Hyperlipidemia Father    Hypertension Father    Arthritis Father    Diabetes Father    Asthma Father    COPD Father     Early death Father    Parkinson's disease Father    Hypertension Mother    Hyperlipidemia Mother    Macular degeneration Mother    Arthritis Mother    Hearing loss Mother    Heart disease Mother    Kidney disease Mother    Throat cancer Brother        lung, and tongue   Arthritis Brother    COPD Brother    Diabetes type II Sister    COPD Sister    Heart disease Sister    Hypertension Sister    Thyroid  cancer Sister    Lung cancer Sister    Early death Sister    COPD Sister    Breast cancer Maternal Aunt    Lung cancer Other        uncle   Emphysema Maternal Aunt    Emphysema Maternal Uncle    Clotting disorder Sister    Brain cancer Sister        brain tumor- not malignant   Breast cancer Cousin    Cancer Sister    Alcohol abuse Sister    COPD Sister    Early death Sister    Alcohol abuse Brother    Arthritis Brother    Depression Brother    Diabetes Brother    Hypercholesterolemia Brother    Colon cancer Neg Hx    Esophageal cancer Neg Hx    Rectal cancer Neg Hx    Stomach cancer Neg Hx    Social History   Occupational History   Occupation: homemaker  Tobacco Use   Smoking status: Never    Passive exposure: Past   Smokeless tobacco: Never  Vaping Use   Vaping status: Never Used  Substance and Sexual Activity   Alcohol use: Yes    Comment: very rarely   Drug use: No   Sexual activity: Yes    Partners: Male    Comment: married   Tobacco Counseling Counseling given: Not Answered  SDOH Screenings   Food Insecurity: Patient Declined (08/19/2024)  Housing: Patient Declined (08/19/2024)  Transportation Needs: Patient Declined (08/19/2024)  Utilities: Not At Risk (08/25/2024)  Alcohol Screen: Low Risk  (06/27/2022)  Depression (PHQ2-9): Low Risk  (08/25/2024)  Financial Resource Strain: Patient Declined (08/19/2024)  Physical Activity: Sufficiently Active (08/19/2024)  Social Connections: Unknown (08/19/2024)  Stress: No Stress Concern Present  (08/25/2024)  Tobacco Use: Low Risk  (08/25/2024)  Health Literacy: Adequate Health Literacy (07/09/2023)   See flowsheets for full screening details  Depression Screen PHQ 2 & 9 Depression Scale- Over the past 2 weeks, how often have you been bothered by any of the following problems? Little interest or pleasure in doing things: 0 Feeling down, depressed, or hopeless (PHQ Adolescent also includes...irritable): 0 PHQ-2 Total Score: 0 Trouble falling or staying asleep, or sleeping too much: 0 Feeling  tired or having little energy: 0 Poor appetite or overeating (PHQ Adolescent also includes...weight loss): 0 Feeling bad about yourself - or that you are a failure or have let yourself or your family down: 0 Trouble concentrating on things, such as reading the newspaper or watching television (PHQ Adolescent also includes...like school work): 0 Moving or speaking so slowly that other people could have noticed. Or the opposite - being so fidgety or restless that you have been moving around a lot more than usual: 0 Thoughts that you would be better off dead, or of hurting yourself in some way: 0 PHQ-9 Total Score: 0 If you checked off any problems, how difficult have these problems made it for you to do your work, take care of things at home, or get along with other people?: Not difficult at all  Depression Treatment Depression Interventions/Treatment : EYV7-0 Score <4 Follow-up Not Indicated     Goals Addressed             This Visit's Progress    Patient Stated   On track    Pt stated firm up and get in better shape        Visit info / Clinical Intake: Medicare Wellness Visit Type:: Subsequent Annual Wellness Visit Persons participating in visit:: patient Medicare Wellness Visit Mode:: Telephone If telephone:: video declined Because this visit was a virtual/telehealth visit:: vitals recorded from last visit If Telephone or Video please confirm:: I connected with the patient  using audio enabled telemedicine application and verified that I am speaking with the correct person using two identifiers; I discussed the limitations of evaluation and management by telemedicine; The patient expressed understanding and agreed to proceed Patient Location:: Home Provider Location:: Home Information given by:: patient Interpreter Needed?: No Pre-visit prep was completed: yes AWV questionnaire completed by patient prior to visit?: yes Living arrangements:: (!) lives alone Patient's Overall Health Status Rating: good Typical amount of pain: none Does pain affect daily life?: no Are you currently prescribed opioids?: no  Dietary Habits and Nutritional Risks How many meals a day?: 3 Eats fruit and vegetables daily?: yes Most meals are obtained by: preparing own meals In the last 2 weeks, have you had any of the following?: none Diabetic:: no  Functional Status Activities of Daily Living (to include ambulation/medication): (Patient-Rptd) Independent Ambulation: Independent with device- listed below Home Assistive Devices/Equipment: Eyeglasses Medication Administration: Independent Home Management: (Patient-Rptd) Independent Manage your own finances?: yes Primary transportation is: driving Concerns about vision?: no *vision screening is required for WTM* Concerns about hearing?: no  Fall Screening Falls in the past year?: (Patient-Rptd) 1 Number of falls in past year: (Patient-Rptd) 0 Was there an injury with Fall?: (Patient-Rptd) 1 Fall Risk Category Calculator: (Patient-Rptd) 2 Patient Fall Risk Level: (Patient-Rptd) Moderate Fall Risk  Fall Risk Patient at Risk for Falls Due to: No Fall Risks Fall risk Follow up: Falls evaluation completed; Falls prevention discussed  Home and Transportation Safety: All rugs have non-skid backing?: yes All stairs or steps have railings?: yes Grab bars in the bathtub or shower?: yes Have non-skid surface in bathtub or  shower?: yes Good home lighting?: yes Regular seat belt use?: yes Hospital stays in the last year:: no  Cognitive Assessment Difficulty concentrating, remembering, or making decisions? : no Will 6CIT or Mini Cog be Completed: no 6CIT or Mini Cog Declined: patient alert, oriented, able to answer questions appropriately and recall recent events  Advance Directives (For Healthcare) Does Patient Have a Medical Advance Directive?:  Yes Type of Advance Directive: Healthcare Power of [place]; Living will  Reviewed/Updated  Reviewed/Updated: Reviewed All (Medical, Surgical, Family, Medications, Allergies, Care Teams, Patient Goals)        Objective:    Today's Vitals   08/25/24 1511  Weight: 130 lb (59 kg)  Height: 4' 11 (1.499 m)   Body mass index is 26.26 kg/m.  Current Medications (verified) Outpatient Encounter Medications as of 08/25/2024  Medication Sig   albuterol  (PROAIR  HFA) 108 (90 Base) MCG/ACT inhaler Inhale 2 puffs into the lungs every 6 (six) hours as needed.   albuterol  (PROVENTIL ) (2.5 MG/3ML) 0.083% nebulizer solution Take 3 mLs (2.5 mg total) by nebulization every 6 (six) hours as needed.   ARMOUR THYROID  15 MG tablet Take 1 tablet by mouth in the AM Once a day   ARMOUR THYROID  30 MG tablet Take 1 tablet by mouth in the AM once a day   Ascorbic Acid (VITAMIN C) 1000 MG tablet Take 3,000 mg by mouth daily.   atenolol  (TENORMIN ) 25 MG tablet Take 0.5-1 tablets (12.5-25 mg total) by mouth daily.   budesonide  (PULMICORT ) 0.5 MG/2ML nebulizer solution TAKE 2 MLS (0.5 MG TOTAL) BY NEBULIZATION IN THE MORNING AND AT BEDTIME.   Carboxymethylcellulose Sodium (THERATEARS OP) Place 1 drop into both eyes 2 (two) times daily.   Coenzyme Q10 (COQ-10) 100 MG CAPS Take 100 mg by mouth daily.   Evolocumab  (REPATHA  SURECLICK) 140 MG/ML SOAJ Inject 140 mg into the skin every 14 (fourteen) days.   famotidine  (PEPCID ) 40 MG tablet Take 1 tablet (40 mg total) by mouth 2 (two) times  daily.   fexofenadine  (ALLERGY RELIEF) 180 MG tablet Take 1 tablet (180 mg total) by mouth daily.   fluconazole  (DIFLUCAN ) 150 MG tablet 1 tab PO today, repeat in 1 week if needed   fluticasone  (FLONASE ) 50 MCG/ACT nasal spray Place 2 sprays into both nostrils daily.   fluticasone -salmeterol (WIXELA INHUB) 250-50 MCG/ACT AEPB Inhale 1 puff into the lungs 2 (two) times daily. in the morning and at bedtime.   folic acid (FOLVITE) 1 MG tablet SMARTSIG:2 Tablet(s) By Mouth   hydrochlorothiazide  (MICROZIDE ) 12.5 MG capsule Take 1 capsule (12.5 mg total) by mouth daily as needed.   hydroxychloroquine  (PLAQUENIL ) 200 MG tablet 1 tablet p.o. twice a day for a week then 1 tab p.o. daily   hyoscyamine  (LEVSIN ) 0.125 MG tablet Take 1 tablet (0.125 mg total) by mouth every 6 (six) hours as needed.   ipratropium (ATROVENT) 0.06 % nasal spray Place 2 sprays into both nostrils 4 (four) times daily.   LORazepam  (ATIVAN ) 0.5 MG tablet Take 1 tablet (0.5 mg total) by mouth 2 (two) times daily as needed for anxiety.   mometasone (ELOCON) 0.1 % lotion Apply 1 application topically daily as needed (ear irritation).    montelukast  (SINGULAIR ) 10 MG tablet TAKE 1 TABLET BY MOUTH EVERYDAY AT BEDTIME   ondansetron  (ZOFRAN -ODT) 8 MG disintegrating tablet TAKE 1 TABLET BY MOUTH EVERY 8 HOURS AS NEEDED FOR NAUSEA OR VOMITING.   Probiotic CAPS Take 1 capsule by mouth daily.   tiZANidine  (ZANAFLEX ) 4 MG tablet Take 1 tablet (4 mg total) by mouth every 6 (six) hours as needed for muscle spasms.   traZODone  (DESYREL ) 50 MG tablet Take 0.5-2 tablets (25-100 mg total) by mouth at bedtime as needed for sleep.   Vonoprazan Fumarate  (VOQUEZNA ) 20 MG TABS Take 20 mg by mouth daily.   zinc gluconate 50 MG tablet Take 100 mg by mouth daily.  No facility-administered encounter medications on file as of 08/25/2024.   Hearing/Vision screen Vision Screening - Comments:: [facility] Immunizations and Health  Maintenance Health Maintenance  Topic Date Due   Medicare Annual Wellness (AWV)  07/08/2024   Influenza Vaccine  01/11/2025 (Originally 05/14/2024)   Mammogram  11/18/2024   DEXA SCAN  08/10/2025   Colonoscopy  11/23/2026   Pneumococcal Vaccine: 50+ Years  Completed   Hepatitis C Screening  Completed   Meningococcal B Vaccine  Aged Out   DTaP/Tdap/Td  Discontinued   COVID-19 Vaccine  Discontinued   Zoster Vaccines- Shingrix  Discontinued        Assessment/Plan:  This is a routine wellness examination for Rita Levine.  Patient Care Team: Catherine Charlies LABOR, DO as PCP - General (Family Medicine) Dann Candyce RAMAN, MD as PCP - Cardiology (Cardiology) Teressa Toribio SQUIBB, MD (Inactive) as Attending Physician (Gastroenterology) Porter Andrez SAUNDERS, PA-C (Inactive) as Physician Assistant (Dermatology) Geroge Iha, OD (Optometry)  I have personally reviewed and noted the following in the patient's chart:   Medical and social history Use of alcohol, tobacco or illicit drugs  Current medications and supplements including opioid prescriptions. Functional ability and status Nutritional status Physical activity Advanced directives List of other physicians Hospitalizations, surgeries, and ER visits in previous 12 months Vitals Screenings to include cognitive, depression, and falls Referrals and appointments  No orders of the defined types were placed in this encounter.  In addition, I have reviewed and discussed with patient certain preventive protocols, quality metrics, and best practice recommendations. A written personalized care plan for preventive services as well as general preventive health recommendations were provided to patient.   Lenox Bink L Artemio Dobie, CMA   08/25/2024   No follow-ups on file.  After Visit Summary: (MyChart) Due to this being a telephonic visit, the after visit summary with patients personalized plan was offered to patient via MyChart   Nurse Notes: Patient  declines the flu vaccine.  Patient is up to date on all other health maintenance with no concerns to address today.

## 2024-08-25 NOTE — Patient Instructions (Signed)
 [name],  Thank you for taking the time for your Medicare Wellness Visit. I appreciate your continued commitment to your health goals. Please review the care plan we discussed, and feel free to reach out if I can assist you further.  Please note that Annual Wellness Visits do not include a physical exam. Some assessments may be limited, especially if the visit was conducted virtually. If needed, we may recommend an in-person follow-up with your provider.  Ongoing Care Seeing your primary care provider every 3 to 6 months helps us  monitor your health and provide consistent, personalized care. Last office visit on 08/20/2024.  Keep up the good work.  Referrals If a referral was made during today's visit and you haven't received any updates within two weeks, please contact the referred provider directly to check on the status.  Recommended Screenings:  Health Maintenance  Topic Date Due   Flu Shot  01/11/2025*   Breast Cancer Screening  11/18/2024   DEXA scan (bone density measurement)  08/10/2025   Medicare Annual Wellness Visit  08/25/2025   Colon Cancer Screening  11/23/2026   Pneumococcal Vaccine for age over 54  Completed   Hepatitis C Screening  Completed   Meningitis B Vaccine  Aged Out   DTaP/Tdap/Td vaccine  Discontinued   COVID-19 Vaccine  Discontinued   Zoster (Shingles) Vaccine  Discontinued  *Topic was postponed. The date shown is not the original due date.       08/24/2024    8:41 PM  Advanced Directives  Does Patient Have a Medical Advance Directive? Yes  Type of Estate Agent of [place];Living will    Vision: Annual vision screenings are recommended for early detection of glaucoma, cataracts, and diabetic retinopathy. These exams can also reveal signs of chronic conditions such as diabetes and high blood pressure.  Dental: Annual dental screenings help detect early signs of oral cancer, gum disease, and other conditions linked to overall  health, including heart disease and diabetes.  Please see the attached documents for additional preventive care recommendations.

## 2024-08-31 DIAGNOSIS — R1319 Other dysphagia: Secondary | ICD-10-CM | POA: Diagnosis not present

## 2024-08-31 DIAGNOSIS — R12 Heartburn: Secondary | ICD-10-CM | POA: Diagnosis not present

## 2024-08-31 DIAGNOSIS — R0989 Other specified symptoms and signs involving the circulatory and respiratory systems: Secondary | ICD-10-CM | POA: Diagnosis not present

## 2024-08-31 DIAGNOSIS — R053 Chronic cough: Secondary | ICD-10-CM | POA: Diagnosis not present

## 2024-08-31 DIAGNOSIS — K449 Diaphragmatic hernia without obstruction or gangrene: Secondary | ICD-10-CM | POA: Diagnosis not present

## 2024-09-01 DIAGNOSIS — H04123 Dry eye syndrome of bilateral lacrimal glands: Secondary | ICD-10-CM | POA: Diagnosis not present

## 2024-09-13 ENCOUNTER — Other Ambulatory Visit: Payer: Self-pay | Admitting: Gastroenterology

## 2024-09-13 ENCOUNTER — Other Ambulatory Visit: Payer: Self-pay | Admitting: Physician Assistant

## 2024-09-17 ENCOUNTER — Other Ambulatory Visit: Payer: Self-pay

## 2024-09-17 ENCOUNTER — Telehealth: Payer: Self-pay | Admitting: Gastroenterology

## 2024-09-17 MED ORDER — FAMOTIDINE 40 MG PO TABS: 40.0000 mg | ORAL_TABLET | Freq: Every day | ORAL | 1 refills | Status: DC

## 2024-09-17 NOTE — Telephone Encounter (Signed)
 Spoke with Arland. She reports a pain or discomfort in my sternum area, but not all the time. Occasionally I feel it under my left breast area and gas. She has not had an interruption of Voquezna . She reports not changes in bowel habits, no recent antibiotics or changes of  medications. She endorses increase stress with holidays. I have 40 people coming over for Christmas. She has not tried anything for her symptoms. Asks if she could have something extra for her symptoms.

## 2024-09-17 NOTE — Telephone Encounter (Signed)
 Inbound call from patient stating that she has been having on going gastritis and has been having burning sensation in her strum area and now has constant aggravation. Patient is requesting a call back. Please advise.

## 2024-09-17 NOTE — Telephone Encounter (Signed)
 Patient advised and agrees to this plan of care. She now tells me she did have an interruption of her Voquezna  for about 2 weeks due to her insurance. She will take both medications for now and follow up if she fails to improve or acutely worsens.

## 2024-09-17 NOTE — Telephone Encounter (Signed)
 Called the patient. No answer. Left message of my call.

## 2024-09-28 DIAGNOSIS — M1711 Unilateral primary osteoarthritis, right knee: Secondary | ICD-10-CM | POA: Diagnosis not present

## 2024-10-04 ENCOUNTER — Telehealth: Payer: Self-pay | Admitting: Gastroenterology

## 2024-10-04 NOTE — Telephone Encounter (Signed)
 Good morning Dr. Shila,  Spoke with pt about upcoming FU appt scheduled 10/05/2024. Pt stated since the holidays are approaching it is hard to travel. Pt requested virtual visit. Informed pt that approval is needed prior to scheduling. Please advise. Thank you

## 2024-10-05 ENCOUNTER — Ambulatory Visit: Admitting: Gastroenterology

## 2024-10-05 NOTE — Telephone Encounter (Signed)
 Sorry we can't do virtual visit

## 2024-10-14 ENCOUNTER — Other Ambulatory Visit: Payer: Self-pay | Admitting: Physician Assistant

## 2024-10-15 ENCOUNTER — Other Ambulatory Visit: Payer: Self-pay | Admitting: Gastroenterology

## 2024-10-15 ENCOUNTER — Other Ambulatory Visit (HOSPITAL_COMMUNITY): Payer: Self-pay

## 2024-10-15 ENCOUNTER — Telehealth: Payer: Self-pay

## 2024-10-15 NOTE — Telephone Encounter (Signed)
 PA request has been Started. New Encounter has been or will be created for follow up. For additional info see Pharmacy Prior Auth telephone encounter from 10/15/2024.

## 2024-10-15 NOTE — Telephone Encounter (Signed)
 Pharmacy Patient Advocate Encounter   Received notification from Pt Calls Messages that prior authorization for Voquezna  20MG  tablets  is required/requested.   Insurance verification completed.   The patient is insured through [facility] Medicare.   Prior Authorization form/request asks a question that requires your assistance. Please see the question below and advise accordingly. The PA will not be submitted until the necessary information is received.    Alternatives required are:  Omeprazole Pantoprazole  Lansoprazole Rabeprazole Dexlansoprazole

## 2024-10-15 NOTE — Telephone Encounter (Signed)
 Please submit for PA

## 2024-10-19 NOTE — Telephone Encounter (Signed)
 In review of patients medication history patient has tried and failed:   Dexilant 60 mg 08/03/2020 Famotidine  40 mg 09/17/2024 Pantoprazole  40 mg 11/06/2021 and in 2022 Ranitidine 150 mg at bedtime 2021   Please use to prior auth the patients Voquenza 20 mg     Thank You Madai Nuccio S

## 2024-10-19 NOTE — Telephone Encounter (Signed)
 As stated, ALL the listed preferred medications are required to have been tried and failed. Medications that the patient has NOT tried are Rabeprazole and Lansoprazole. If a prior authorization is submitted without record of patient trying these two medications, it will result in a denial.

## 2024-10-20 NOTE — Telephone Encounter (Signed)
 Please send prescription for rabeprazole 20 mg twice daily 30 days with 3 refills.  Advised patient to call back if she continues to have persistent symptoms.  Follow-up office visit with me or APP in 2 to 3 months.

## 2024-10-21 ENCOUNTER — Other Ambulatory Visit (HOSPITAL_COMMUNITY): Payer: Self-pay

## 2024-10-21 ENCOUNTER — Other Ambulatory Visit: Payer: Self-pay

## 2024-10-21 ENCOUNTER — Other Ambulatory Visit: Payer: Self-pay | Admitting: Gastroenterology

## 2024-10-21 MED ORDER — RABEPRAZOLE SODIUM 20 MG PO TBEC: 20.0000 mg | DELAYED_RELEASE_TABLET | Freq: Two times a day (BID) | ORAL | 3 refills | Status: DC

## 2024-10-21 NOTE — Telephone Encounter (Signed)
 Please send Rx for Rabeprazole  20mg  daily 90 days with 1 refill.

## 2024-10-21 NOTE — Telephone Encounter (Signed)
 Patient is advised and agrees to this plan of care. No further questions at this time.

## 2024-10-21 NOTE — Telephone Encounter (Signed)
 It seems the insurance will only cover 1 tablet daily, I have a rejection back from the pharmacy

## 2024-10-22 ENCOUNTER — Other Ambulatory Visit (HOSPITAL_COMMUNITY): Payer: Self-pay

## 2024-10-22 MED ORDER — RABEPRAZOLE SODIUM 20 MG PO TBEC: 20.0000 mg | DELAYED_RELEASE_TABLET | Freq: Every day | ORAL | 3 refills | Status: DC

## 2024-10-22 NOTE — Addendum Note (Signed)
 Addended by: BETTIE GRAYCE HERO on: 10/22/2024 08:37 AM   Modules accepted: Orders

## 2024-10-22 NOTE — Telephone Encounter (Signed)
 Aciphex  sent in for once daily  90 day supply, we will see if the insurance covers this

## 2024-10-25 ENCOUNTER — Encounter: Admitting: Family Medicine

## 2024-10-26 ENCOUNTER — Ambulatory Visit: Payer: Self-pay

## 2024-10-26 ENCOUNTER — Ambulatory Visit: Admitting: Physician Assistant

## 2024-10-26 ENCOUNTER — Encounter: Payer: Self-pay | Admitting: Physician Assistant

## 2024-10-26 VITALS — BP 141/61 | HR 87 | Temp 98.8°F | Ht 59.0 in | Wt 130.0 lb

## 2024-10-26 DIAGNOSIS — J029 Acute pharyngitis, unspecified: Secondary | ICD-10-CM | POA: Diagnosis not present

## 2024-10-26 DIAGNOSIS — K224 Dyskinesia of esophagus: Secondary | ICD-10-CM

## 2024-10-26 LAB — POCT RAPID STREP A (OFFICE): Rapid Strep A Screen: NEGATIVE

## 2024-10-26 MED ORDER — AMOXICILLIN 875 MG PO TABS: 875.0000 mg | ORAL_TABLET | Freq: Two times a day (BID) | ORAL | 0 refills | Status: DC

## 2024-10-26 NOTE — Patient Instructions (Signed)
 Sore Throat  When you have a sore throat, your throat may feel:  Tender.  Burning.  Irritated.  Scratchy.  Painful when you swallow.  Painful when you talk.  Many things can cause a sore throat, such as:  An infection.  Allergies.  Dry air.  Smoke or pollution.  Radiation treatment for cancer.  Gastroesophageal reflux disease (GERD).  A tumor.  A sore throat can be the first sign of another sickness. It can happen with other problems, like:  Coughing.  Sneezing.  Fever.  Swelling of the glands in the neck.  Most sore throats go away without treatment.  Follow these instructions at home:         Medicines  Take over-the-counter and prescription medicines only as told by your doctor.  Children often get sore throats. Do not give your child aspirin.  Use throat sprays to soothe your throat as told by your health care provider.  Managing pain  To help with pain:  Sip warm liquids, such as broth, herbal tea, or warm water.  Eat or drink cold or frozen liquids, such as frozen ice pops.  Rinse your mouth (gargle) with a salt water mixture 3-4 times a day or as needed.  To make salt water, dissolve -1 tsp (3-6 g) of salt in 1 cup (237 mL) of warm water.  Do not swallow this mixture.  Suck on hard candy or throat lozenges.  Put a cool-mist humidifier in your bedroom at night.  Sit in the bathroom with the door closed for 5-10 minutes while you run hot water in the shower.  General instructions  Do not smoke or use any products that contain nicotine or tobacco. If you need help quitting, ask your doctor.  Get plenty of rest.  Drink enough fluid to keep your pee (urine) pale yellow.  Wash your hands often for at least 20 seconds with soap and water. If soap and water are not available, use hand sanitizer.  Contact a doctor if:  You have a fever for more than 2-3 days.  You keep having symptoms for more than 2-3 days.  Your throat does not get better in 7 days.  You have a fever and your symptoms suddenly get worse.  Your  child who is 3 months to 31 years old has a temperature of 102.71F (39C) or higher.  Get help right away if:  You have trouble breathing.  You cannot swallow fluids, soft foods, or your spit.  You have swelling in your throat or neck that gets worse.  You feel like you may vomit (nauseous) and this feeling lasts a long time.  You cannot stop vomiting.  These symptoms may be an emergency. Get help right away. Call your local emergency services (911 in the [place]  Do not wait to see if the symptoms will go away.  Do not drive yourself to the hospital.  Summary  A sore throat is a painful, burning, irritated, or scratchy throat. Many things can cause a sore throat.  Take over-the-counter medicines only as told by your doctor.  Get plenty of rest.  Drink enough fluid to keep your pee (urine) pale yellow.  Contact a doctor if your symptoms get worse or your sore throat does not get better within 7 days.  This information is not intended to replace advice given to you by your health care provider. Make sure you discuss any questions you have with your health care provider.  Document Revised: 12/27/2020 Document  Reviewed: 12/27/2020  Elsevier Patient Education  2024 [employer].

## 2024-10-26 NOTE — Telephone Encounter (Signed)
 FYI Only or Action Required?: FYI only for provider: appointment scheduled on 10/26/2024 at 10:30am at [facility] with Jade Breeback PA-C due to no openings at PCP office today.  Patient was last seen in primary care on 08/20/2024 by Catherine Fuller A, DO.  Called Nurse Triage reporting Sore Throat.  Symptoms began several days ago.  Interventions attempted: OTC medications: Benadryl/Milanta mix per patient and over the counter medications not specified by patient and Rest, hydration, or home remedies.  Symptoms are: gradually worsening.  Triage Disposition: See Physician Within 24 Hours  Patient/caregiver understands and will follow disposition?: Yes            Copied from CRM #[id number]. Topic: Clinical - Red Word Triage >> Oct 26, 2024  8:56 AM Mesmerise C wrote: [facility] that prompted transfer to Nurse Triage: Patient would like a strep test done states she's having trouble swallowing, fullness and pain in her ears, spots on tongue Reason for Disposition  Earache also present  Answer Assessment - Initial Assessment Questions Sore throat for several days Patient called and advised that she thinks she may have strep throat She states that she started with some sinus issues--she had gone to an ENT She states the ENT just checked her ears and she did not have an ear infection at that time Patient looked in her throat last night and states that it looked like strep to her.  Patient states that she has been trying home remedies Patient states she has been taking a Benadryl/Milanta mix Patient denies difficulty breathing or any fevers  10/26/2024 at 10:30am at [facility] with Jade Breeback PA-C due to no openings at PCP office today  Patient is advised to call us  back if anything changes or with any further questions/concerns. Patient is advised that if anything worsens to go to the Emergency Room. Patient verbalized understanding.  Protocols used: Sore  Throat-A-AH

## 2024-10-26 NOTE — Progress Notes (Unsigned)
" ° °  Acute Office Visit  Subjective:     Patient ID: Rita Levine, female    DOB: May 01, 1951, 74 y.o.   MRN: [id number]  Chief Complaint  Patient presents with   Sore Throat    HPI Patient is in today for ***  ROS      Objective:    BP (!) 141/61   Pulse 87   Temp 98.8 F (37.1 C)   Ht 4' 11 (1.499 m)   Wt 130 lb (59 kg)   SpO2 99%   BMI 26.26 kg/m  {Vitals History (Optional):23777}  Physical Exam  Results for orders placed or performed in visit on 10/26/24  POCT rapid strep A  Result Value Ref Range   Rapid Strep A Screen Negative Negative        Assessment & Plan:   Problem List Items Addressed This Visit   None Visit Diagnoses       Sore throat    -  Primary   Relevant Orders   POCT rapid strep A (Completed)       No orders of the defined types were placed in this encounter.   No follow-ups on file.  Torra Pala, PA-C   "

## 2024-10-27 ENCOUNTER — Ambulatory Visit: Payer: Self-pay | Admitting: Family Medicine

## 2024-10-27 ENCOUNTER — Encounter: Payer: Self-pay | Admitting: Family Medicine

## 2024-10-27 ENCOUNTER — Ambulatory Visit: Admitting: Family Medicine

## 2024-10-27 VITALS — BP 122/72 | HR 69 | Temp 98.2°F | Ht 59.0 in | Wt 130.0 lb

## 2024-10-27 DIAGNOSIS — J302 Other seasonal allergic rhinitis: Secondary | ICD-10-CM

## 2024-10-27 DIAGNOSIS — M791 Myalgia, unspecified site: Secondary | ICD-10-CM

## 2024-10-27 DIAGNOSIS — F418 Other specified anxiety disorders: Secondary | ICD-10-CM

## 2024-10-27 DIAGNOSIS — M159 Polyosteoarthritis, unspecified: Secondary | ICD-10-CM

## 2024-10-27 DIAGNOSIS — E663 Overweight: Secondary | ICD-10-CM | POA: Insufficient documentation

## 2024-10-27 DIAGNOSIS — R002 Palpitations: Secondary | ICD-10-CM | POA: Diagnosis not present

## 2024-10-27 DIAGNOSIS — M797 Fibromyalgia: Secondary | ICD-10-CM

## 2024-10-27 DIAGNOSIS — E039 Hypothyroidism, unspecified: Secondary | ICD-10-CM

## 2024-10-27 DIAGNOSIS — I700 Atherosclerosis of aorta: Secondary | ICD-10-CM

## 2024-10-27 DIAGNOSIS — M8589 Other specified disorders of bone density and structure, multiple sites: Secondary | ICD-10-CM | POA: Diagnosis not present

## 2024-10-27 DIAGNOSIS — I2510 Atherosclerotic heart disease of native coronary artery without angina pectoris: Secondary | ICD-10-CM | POA: Diagnosis not present

## 2024-10-27 DIAGNOSIS — E782 Mixed hyperlipidemia: Secondary | ICD-10-CM | POA: Diagnosis not present

## 2024-10-27 DIAGNOSIS — I10 Essential (primary) hypertension: Secondary | ICD-10-CM

## 2024-10-27 DIAGNOSIS — G479 Sleep disorder, unspecified: Secondary | ICD-10-CM

## 2024-10-27 DIAGNOSIS — Z131 Encounter for screening for diabetes mellitus: Secondary | ICD-10-CM

## 2024-10-27 DIAGNOSIS — K219 Gastro-esophageal reflux disease without esophagitis: Secondary | ICD-10-CM

## 2024-10-27 DIAGNOSIS — J4540 Moderate persistent asthma, uncomplicated: Secondary | ICD-10-CM

## 2024-10-27 DIAGNOSIS — Z1231 Encounter for screening mammogram for malignant neoplasm of breast: Secondary | ICD-10-CM

## 2024-10-27 DIAGNOSIS — E041 Nontoxic single thyroid nodule: Secondary | ICD-10-CM | POA: Diagnosis not present

## 2024-10-27 DIAGNOSIS — T466X5A Adverse effect of antihyperlipidemic and antiarteriosclerotic drugs, initial encounter: Secondary | ICD-10-CM | POA: Insufficient documentation

## 2024-10-27 DIAGNOSIS — Z0000 Encounter for general adult medical examination without abnormal findings: Secondary | ICD-10-CM | POA: Diagnosis not present

## 2024-10-27 LAB — COMPREHENSIVE METABOLIC PANEL WITH GFR
ALT: 16 U/L (ref 3–35)
AST: 17 U/L (ref 5–37)
Albumin: 4.2 g/dL (ref 3.5–5.2)
Alkaline Phosphatase: 60 U/L (ref 39–117)
BUN: 11 mg/dL (ref 6–23)
CO2: 34 meq/L — ABNORMAL HIGH (ref 19–32)
Calcium: 9.6 mg/dL (ref 8.4–10.5)
Chloride: 101 meq/L (ref 96–112)
Creatinine, Ser: 0.74 mg/dL (ref 0.40–1.20)
GFR: 80.25 mL/min
Glucose, Bld: 100 mg/dL — ABNORMAL HIGH (ref 70–99)
Potassium: 4.4 meq/L (ref 3.5–5.1)
Sodium: 140 meq/L (ref 135–145)
Total Bilirubin: 0.6 mg/dL (ref 0.2–1.2)
Total Protein: 6.5 g/dL (ref 6.0–8.3)

## 2024-10-27 LAB — LIPID PANEL
Cholesterol: 137 mg/dL (ref 28–200)
HDL: 59.1 mg/dL
LDL Cholesterol: 43 mg/dL (ref 10–99)
NonHDL: 77.45
Total CHOL/HDL Ratio: 2
Triglycerides: 174 mg/dL — ABNORMAL HIGH (ref 10.0–149.0)
VLDL: 34.8 mg/dL (ref 0.0–40.0)

## 2024-10-27 LAB — CBC
HCT: 41.9 % (ref 36.0–46.0)
Hemoglobin: 14.1 g/dL (ref 12.0–15.0)
MCHC: 33.7 g/dL (ref 30.0–36.0)
MCV: 87.1 fl (ref 78.0–100.0)
Platelets: 261 K/uL (ref 150.0–400.0)
RBC: 4.81 Mil/uL (ref 3.87–5.11)
RDW: 13.9 % (ref 11.5–15.5)
WBC: 5.3 K/uL (ref 4.0–10.5)

## 2024-10-27 LAB — TSH: TSH: 1.4 u[IU]/mL (ref 0.35–5.50)

## 2024-10-27 LAB — VITAMIN D 25 HYDROXY (VIT D DEFICIENCY, FRACTURES): VITD: 43.8 ng/mL (ref 30.00–100.00)

## 2024-10-27 LAB — HEMOGLOBIN A1C: Hgb A1c MFr Bld: 6 % (ref 4.6–6.5)

## 2024-10-27 MED ORDER — MONTELUKAST SODIUM 10 MG PO TABS: ORAL_TABLET | ORAL | 1 refills | Status: AC

## 2024-10-27 MED ORDER — ALBUTEROL SULFATE HFA 108 (90 BASE) MCG/ACT IN AERS: 2.0000 | INHALATION_SPRAY | Freq: Four times a day (QID) | RESPIRATORY_TRACT | 5 refills | Status: AC | PRN

## 2024-10-27 MED ORDER — LORAZEPAM 0.5 MG PO TABS: 0.5000 mg | ORAL_TABLET | Freq: Two times a day (BID) | ORAL | 5 refills | Status: AC | PRN

## 2024-10-27 MED ORDER — FLUTICASONE PROPIONATE 50 MCG/ACT NA SUSP: 2.0000 | Freq: Every day | NASAL | 11 refills | Status: AC

## 2024-10-27 MED ORDER — TRAZODONE HCL 50 MG PO TABS: 25.0000 mg | ORAL_TABLET | Freq: Every evening | ORAL | 1 refills | Status: AC | PRN

## 2024-10-27 MED ORDER — TIZANIDINE HCL 4 MG PO TABS: 4.0000 mg | ORAL_TABLET | Freq: Four times a day (QID) | ORAL | 5 refills | Status: AC | PRN

## 2024-10-27 MED ORDER — HYDROCHLOROTHIAZIDE 12.5 MG PO CAPS: 12.5000 mg | ORAL_CAPSULE | Freq: Every day | ORAL | 1 refills | Status: AC | PRN

## 2024-10-27 MED ORDER — ARMOUR THYROID 15 MG PO TABS: ORAL_TABLET | ORAL | 3 refills | Status: AC

## 2024-10-27 MED ORDER — ARMOUR THYROID 30 MG PO TABS: ORAL_TABLET | ORAL | 3 refills | Status: AC

## 2024-10-27 MED ORDER — ALBUTEROL SULFATE (2.5 MG/3ML) 0.083% IN NEBU: 2.5000 mg | INHALATION_SOLUTION | Freq: Four times a day (QID) | RESPIRATORY_TRACT | 0 refills | Status: AC | PRN

## 2024-10-27 MED ORDER — ATENOLOL 25 MG PO TABS: 12.5000 mg | ORAL_TABLET | Freq: Every day | ORAL | 1 refills | Status: AC

## 2024-10-27 MED ORDER — BUDESONIDE 0.5 MG/2ML IN SUSP: 0.5000 mg | Freq: Two times a day (BID) | RESPIRATORY_TRACT | 2 refills | Status: AC

## 2024-10-27 MED ORDER — FEXOFENADINE HCL 180 MG PO TABS: 180.0000 mg | ORAL_TABLET | Freq: Every day | ORAL | 1 refills | Status: AC

## 2024-10-27 NOTE — Progress Notes (Signed)
 "     Patient ID: Rita Levine, female  DOB: 11/15/1950, 74 y.o.   MRN: [id number] Patient Care Team    Relationship Specialty Notifications Start End  Catherine Charlies LABOR, DO PCP - General Family Medicine  12/01/19   Geroge Iha, OD  Optometry  07/26/22   Shila Gustav GAILS, MD Consulting Physician Gastroenterology  10/27/24   Dann Candyce RAMAN, MD Consulting Physician Cardiology  10/27/24   Bonney Melanie SAUNDERS, MD Consulting Physician Dermatology  10/27/24     Chief Complaint  Patient presents with   Annual Exam    Pt is fasting.     Subjective: Rita Levine is a 74 y.o.  Female  present for chronic condition management All past medical history, surgical history, allergies, family history, immunizations, medications and social history were updated in the electronic medical record today. All recent labs, ED visits and hospitalizations within the last year were reviewed.  Health maintenance:  Colonoscopy: completed 11/24/2023, by Dr. Shila  resutls 3 yr per pt.  Mammogram: completed: UTD 11/19/2023 [facility]. Ordered today 2026 Immunizations: tdap -allergic, Influenza -declined (encouraged yearly), PNA series completed, zostavax/shingrix declined Infectious disease screening:  Hep C completed.  DEXA: last completed 07/2023 (-2.3), osteopenia> rpt 32yr  Gastroesophageal reflux disease, unspecified whether esophagitis present Patient reports symptoms are controlled on Protonix  and Pepcid .  Established with Dr. Shila   Essential hypertension/HLD/Statin declined/Palpitations/Obesity (BMI 30-39.9)/statin intolerance Pt reports compliance with atenolol , Maxide..   Pt does not daily baby ASA.  Patient is compliant with Zetia  use. Patient denies chest pain, shortness of breath, dizziness or lower extremity edema.      Fibromyalgia/osteoarthritis, unspecified osteoarthritis type, unspecified site/ DDD (degenerative disc disease), lumbar/Spinal stenosis of lumbar region, unspecified  whether neurogenic claudication present Patient reports her fibromyalgia and arthritis symptoms are well-controlled on tylenol , tumeric and Zanaflex .    Seasonal allergies/Allergic rhinitis due to pollen, unspecified seasonality/ Multiple food allergies She reports allergies are well-controlled on Xyzal  nightly, Singulair  nightly and Allegra  as needed in the day. Prior note: Patient reports her allergies have always been rather uncontrolled.  When she lived in a different state she had food allergy testing and was allergic to many foods.  She at one time had allergy shots and this is when her allergies were the best as far as control.  She reports frequent occurrence of sinus infections and sinus headaches.  She had an MRI in 2013.  Her current regimen consist of Xyzal , Allegra , Singulair  and Flonase .  She has taking Zyrtec in the past.  She reports the Xyzal  was added last year but she does not feel its been helpful.    Asthma, intrinsic Patient reports her asthma is well-controlled.  Established with pulmonology   Hypothyroidism, unspecified type/Thyroid  nodule She reports compliance with Armour 45 mg total dose.  [facility]  due to cost. Prior note: Thyroid  nodule history.  Last ultrasound 08/17/2018 with left thyroid  nodule.  Per radiology report 1 year follow-up was recommended.  Patient does endorse mild compression-like symptoms.  She states her prior PCP had ordered follow-up, but it was canceled secondary to Covid pandemic.  Patient had thyroid  ultrasound completed at [facility] radiology in [place].  Sleep disturbance: Pt reports trazodone  50 mg nightly is working well for her.  Anxiety: Patient reports Ativan  0.5 mg twice daily as needed is working well for her anxiety.  She states she does not take this very frequently.     10/27/2024  8:44 AM 10/26/2024   12:05 PM 08/25/2024    3:23 PM 05/17/2024    9:08 AM 07/09/2023     2:12 PM  Depression screen PHQ 2/9  Decreased Interest 0 0 0 0 1  Down, Depressed, Hopeless 0 0 0 0 1  PHQ - 2 Score 0 0 0 0 2  Altered sleeping 0 1 0 1   Tired, decreased energy 1 0 0 1   Change in appetite 1 0 0 0   Feeling bad or failure about yourself  0 0 0 0   Trouble concentrating 0 0 0 0   Moving slowly or fidgety/restless 0 0 0 0   Suicidal thoughts 0 0 0 0 0  PHQ-9 Score 2 1 0 2    Difficult doing work/chores Not difficult at all Not difficult at all Not difficult at all Not difficult at all Somewhat difficult     Data saved with a previous flowsheet row definition      10/27/2024    8:45 AM 10/26/2024   12:07 PM 05/17/2024    9:09 AM 06/23/2023   11:55 AM  GAD 7 : Generalized Anxiety Score  Nervous, Anxious, on Edge 0 0 0 2  Control/stop worrying 0 0 0 2  Worry too much - different things 0 0 0 1  Trouble relaxing 0 0 0 2  Restless 0 0 0 0  Easily annoyed or irritable 0 0 0 0  Afraid - awful might happen 0 0 0 1  Total GAD 7 Score 0 0 0 8  Anxiety Difficulty Not difficult at all Not difficult at all Not difficult at all Somewhat difficult      06/23/2023   11:54 AM 07/09/2023    2:09 PM 05/17/2024    9:08 AM 08/24/2024    8:41 PM 10/27/2024    8:44 AM  Fall Risk  Falls in the past year? 0 0 0 1  0  Was there an injury with Fall? 0  0   1   0  Fall Risk Category Calculator 0 0  2  0  Patient at Risk for Falls Due to No Fall Risks No Fall Risks  No Fall Risks No Fall Risks  Fall risk Follow up Falls evaluation completed Falls prevention discussed Falls evaluation completed Falls evaluation completed;Falls prevention discussed Falls evaluation completed     Manually entered by patient   Data saved with a previous flowsheet row definition     Immunization History  Administered Date(s) Administered   Influenza-Unspecified 06/11/2019, 07/14/2020   Novel Infuenza-h1n1-09 08/03/2008   Pneumococcal Conjugate-13 07/03/2016   Pneumococcal Polysaccharide-23 08/03/2019    Tdap 05/02/2014   Zoster, Live 10/14/2010    Past Medical History:  Diagnosis Date   Abdominal bloating 03/25/2024   Acne 06/02/2020   Allergic rhinitis    Allergic rhinitis due to pollen 12/03/2019   Allergy    Anemia    Asthma    Cataract    Chest pain, unspecified 04/07/2014   Chicken pox    Colon polyp    Combined forms of age-related cataract of left eye 05/06/2024   Combined forms of age-related cataract of right eye 03/04/2024   Compression fracture of lumbar vertebra (HCC) 10/30/2023   Diverticulosis    Dysphagia    Dysrhythmia    Palpitations   Fibromyalgia    Food allergy    Gallstone    Generalized abdominal pain 02/20/2023   GERD (gastroesophageal reflux disease)    Globus  sensation 03/25/2024   Head movements abnormal 10/16/2020   Hiatal hernia    Hypercholesteremia    Hypertension    Hypokalemia 06/23/2023   Hypothyroidism    IBS (irritable bowel syndrome)    Ingrown toenail 08/23/2020   Migraine headache    occasionally   Morton neuroma, 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   Multiple food allergies 12/03/2019   Osteoarthritis    Osteopenia    Pneumonia    Pneumonia due to COVID-19 virus 06/23/2023   PONV (postoperative nausea and vomiting)    Pulmonary hypertension (HCC)    pt denies, pt states she was tested for it but was not diagnosed with it   Pulmonary nodule-3 mm right upper lobe nodule-stable; no further evaluation required 04/24/2022   Rectal bleeding    Regular astigmatism of left eye 05/06/2024   Regular astigmatism of right eye 03/04/2024   SBO (small bowel obstruction) (HCC) 10/20/2017   Seasonal allergies    Seborrheic dermatitis of scalp 06/02/2020   Subconjunctival hemorrhage of left eye 08/01/2022   Thyroid  nodule    Trigger finger of left and right ring fingers 01/11/2021   Allergies  Allergen Reactions   Paxlovid  [Nirmatrelvir -Ritonavir ] Shortness Of Breath and Palpitations   Tetanus Toxoid Swelling    Reports a fever and headache with  swelling.    Avelox [Moxifloxacin Hcl In Nacl] Hives    Racing heart   Cephalexin Hives   Codeine Hives    Heart racing   Cymbalta  [Duloxetine  Hcl] Other (See Comments)    Increased tremors   Erythromycin Hives   Propranolol Hives   Sulfa Antibiotics Hives   Sulfamethoxazole-Trimethoprim Hives   Tramadol Hives   Duloxetine  Other (See Comments)   Flagyl [Metronidazole] Other (See Comments)    Unknown reaction, unknown severity   Statins Other (See Comments)    Myalgia    Past Surgical History:  Procedure Laterality Date   ABDOMINAL HYSTERECTOMY     APPENDECTOMY  1982   BREAST EXCISIONAL BIOPSY Right 1995   benign   BREAST LUMPECTOMY  1991   CARPAL TUNNEL RELEASE Right 2006   Right wrist   CATHETER REMOVAL     CESAREAN SECTION     x 2   CHOLECYSTECTOMY N/A 07/25/2020   Procedure: LAPAROSCOPIC CHOLECYSTECTOMY;  Surgeon: Aron Shoulders, MD;  Location: [facility] OR;  Service: General;  Laterality: N/A;   COLON RESECTION  1990   COLON SURGERY     COLONOSCOPY     CT ABDOMEN PELVIS W ([facility] HX)  01/03/2020   IMAGE MRI brain:  04/2012   No focal IAC or inner ear lesion to explain hearing loss. Slightly greater than expected number of subcortical T2- hyperintensities bilaterally.  These are nonspecific.  They can be seen in the setting of chronic migraine headaches, demyelinating process, chronic microvascular ischemic, prior infection or inflammation, or vasculitis   IMAGE MRI lumbar:  05/01/2006   Mild central canal stenosis with facet arthropathy and mildly bulging disc at L4-5.  There is mild narrowing in the left lateral recess at this level.  No definite neural impingement.  Appearance at this level has not markedly Moderately severe facet arthropathy at L5-S1 with mild interval progression of a diffuse broad based disc bulge.  There is mild biforaminal narrowing.  Central canal is open   LAPAROSCOPIC ABDOMINAL EXPLORATION  2019   adhesion removal> caused bowel blockage    NASAL SEPTUM  SURGERY  2004   SMALL INTESTINE SURGERY     TONSILLECTOMY  1965   TOTAL  ABDOMINAL HYSTERECTOMY W/ BILATERAL SALPINGOOPHORECTOMY  1988   TUBAL LIGATION  1974   UPPER GASTROINTESTINAL ENDOSCOPY  2020   Family History  Problem Relation Age of Onset   Hyperlipidemia Father    Hypertension Father    Arthritis Father    Diabetes Father    Asthma Father    COPD Father    Early death Father    Parkinson's disease Father    Hypertension Mother    Hyperlipidemia Mother    Macular degeneration Mother    Arthritis Mother    Hearing loss Mother    Heart disease Mother    Kidney disease Mother    Throat cancer Brother        lung, and tongue   Arthritis Brother    COPD Brother    Diabetes type II Sister    COPD Sister    Heart disease Sister    Hypertension Sister    Thyroid  cancer Sister    Lung cancer Sister    Early death Sister    COPD Sister    Breast cancer Maternal Aunt    Lung cancer Other        uncle   Emphysema Maternal Aunt    Emphysema Maternal Uncle    Clotting disorder Sister    Brain cancer Sister        brain tumor- not malignant   Breast cancer Cousin    Cancer Sister    Alcohol abuse Sister    COPD Sister    Early death Sister    Alcohol abuse Brother    Arthritis Brother    Depression Brother    Diabetes Brother    Hypercholesterolemia Brother    Colon cancer Neg Hx    Esophageal cancer Neg Hx    Rectal cancer Neg Hx    Stomach cancer Neg Hx    Social History   Social History Narrative   Marital status/children/pets: married, 2 children   Education/employment: HS grad. retired   Field Seismologist:      -smoke alarm in the home:Yes     - wears seatbelt: Yes     - Feels safe in their relationships: Yes      Lives alone/2025    Allergies as of 10/27/2024       Reactions   Paxlovid  [nirmatrelvir -ritonavir ] Shortness Of Breath, Palpitations   Tetanus Toxoid Swelling   Reports a fever and headache with swelling.    Avelox [moxifloxacin Hcl In Nacl]  Hives   Racing heart   Cephalexin Hives   Codeine Hives   Heart racing   Cymbalta  [duloxetine  Hcl] Other (See Comments)   Increased tremors   Erythromycin Hives   Propranolol Hives   Sulfa Antibiotics Hives   Sulfamethoxazole-trimethoprim Hives   Tramadol Hives   Duloxetine  Other (See Comments)   Flagyl [metronidazole] Other (See Comments)   Unknown reaction, unknown severity   Statins Other (See Comments)   Myalgia        Medication List        Accurate as of October 27, 2024  4:09 PM. If you have any questions, ask your nurse or doctor.          STOP taking these medications    fluconazole  150 MG tablet Commonly known as: DIFLUCAN  Stopped by: Charlies Bellini, DO       TAKE these medications    albuterol  108 (90 Base) MCG/ACT inhaler Commonly known as: ProAir  HFA Inhale 2 puffs into the lungs every 6 (six) hours as  needed.   albuterol  (2.5 MG/3ML) 0.083% nebulizer solution Commonly known as: PROVENTIL  Take 3 mLs (2.5 mg total) by nebulization every 6 (six) hours as needed.   amoxicillin  875 MG tablet Commonly known as: AMOXIL  Take 1 tablet (875 mg total) by mouth 2 (two) times daily for 10 days.   Armour Thyroid  15 MG tablet Generic drug: thyroid  Take 1 tablet by mouth in the AM Once a day   Armour Thyroid  30 MG tablet Generic drug: thyroid  Take 1 tablet by mouth in the AM once a day   atenolol  25 MG tablet Commonly known as: TENORMIN  Take 0.5-1 tablets (12.5-25 mg total) by mouth daily.   budesonide  0.5 MG/2ML nebulizer solution Commonly known as: PULMICORT  Take 2 mLs (0.5 mg total) by nebulization 2 (two) times daily. What changed: See the new instructions. Changed by: Charlies Bellini, DO   CoQ-10 100 MG Caps Take 100 mg by mouth daily.   famotidine  40 MG tablet Commonly known as: PEPCID  TAKE 1 TABLET BY MOUTH EVERY DAY   fexofenadine  180 MG tablet Commonly known as: Allergy Relief Take 1 tablet (180 mg total) by mouth daily.    fluticasone  50 MCG/ACT nasal spray Commonly known as: FLONASE  Place 2 sprays into both nostrils daily.   fluticasone -salmeterol 250-50 MCG/ACT Aepb Commonly known as: Wixela Inhub Inhale 1 puff into the lungs 2 (two) times daily. in the morning and at bedtime.   folic acid 1 MG tablet Commonly known as: FOLVITE SMARTSIG:2 Tablet(s) By Mouth   hydrochlorothiazide  12.5 MG capsule Commonly known as: MICROZIDE  Take 1 capsule (12.5 mg total) by mouth daily as needed.   hydroxychloroquine  200 MG tablet Commonly known as: Plaquenil  1 tablet p.o. twice a day for a week then 1 tab p.o. daily   hyoscyamine  0.125 MG tablet Commonly known as: LEVSIN  Take 1 tablet (0.125 mg total) by mouth every 6 (six) hours as needed.   ipratropium 0.06 % nasal spray Commonly known as: ATROVENT  Place 2 sprays into both nostrils 4 (four) times daily.   LORazepam  0.5 MG tablet Commonly known as: ATIVAN  Take 1 tablet (0.5 mg total) by mouth 2 (two) times daily as needed for anxiety.   mometasone 0.1 % lotion Commonly known as: ELOCON Apply 1 application topically daily as needed (ear irritation).   montelukast  10 MG tablet Commonly known as: SINGULAIR  TAKE 1 TABLET BY MOUTH EVERYDAY AT BEDTIME   ondansetron  4 MG disintegrating tablet Commonly known as: ZOFRAN -ODT TAKE 1 TABLET BY MOUTH EVERY 4 TO 6 HOURS What changed: Another medication with the same name was removed. Continue taking this medication, and follow the directions you see here. Changed by: Charlies Bellini, DO   Probiotic Caps Take 1 capsule by mouth daily.   RABEprazole  20 MG tablet Commonly known as: ACIPHEX  Take 1 tablet (20 mg total) by mouth daily.   Repatha  SureClick 140 MG/ML Soaj Generic drug: Evolocumab  Inject 140 mg into the skin every 14 (fourteen) days.   THERATEARS OP Place 1 drop into both eyes 2 (two) times daily.   tiZANidine  4 MG tablet Commonly known as: ZANAFLEX  Take 1 tablet (4 mg total) by mouth every 6  (six) hours as needed for muscle spasms.   traZODone  50 MG tablet Commonly known as: DESYREL  Take 0.5-2 tablets (25-100 mg total) by mouth at bedtime as needed for sleep.   vitamin C 1000 MG tablet Take 3,000 mg by mouth daily.   zinc gluconate 50 MG tablet Take 100 mg by mouth daily.  All past medical history, surgical history, allergies, family history, immunizations andmedications were updated in the EMR today and reviewed under the history and medication portions of their EMR.      ROS: 14 pt review of systems performed and negative (unless mentioned in an HPI)  Objective: BP 122/72   Pulse 69   Temp 98.2 F (36.8 C)   Ht 4' 11 (1.499 m)   Wt 130 lb (59 kg)   SpO2 96%   BMI 26.26 kg/m  Physical Exam Vitals and nursing note reviewed.  Constitutional:      General: She is not in acute distress.    Appearance: Normal appearance. She is not ill-appearing, toxic-appearing or diaphoretic.  HENT:     Head: Normocephalic and atraumatic.     Right Ear: Tympanic membrane, ear canal and external ear normal. There is no impacted cerumen.     Left Ear: Tympanic membrane, ear canal and external ear normal. There is no impacted cerumen.     Nose: No congestion or rhinorrhea.     Mouth/Throat:     Mouth: Mucous membranes are moist.     Pharynx: Oropharynx is clear. No oropharyngeal exudate or posterior oropharyngeal erythema.  Eyes:     General: No scleral icterus.       Right eye: No discharge.        Left eye: No discharge.     Extraocular Movements: Extraocular movements intact.     Conjunctiva/sclera: Conjunctivae normal.     Pupils: Pupils are equal, round, and reactive to light.  Cardiovascular:     Rate and Rhythm: Normal rate and regular rhythm.     Pulses: Normal pulses.     Heart sounds: Normal heart sounds. No murmur heard.    No friction rub. No gallop.  Pulmonary:     Effort: Pulmonary effort is normal. No respiratory distress.     Breath sounds:  Normal breath sounds. No stridor. No wheezing, rhonchi or rales.  Chest:     Chest wall: No tenderness.  Abdominal:     General: Abdomen is flat. Bowel sounds are normal. There is no distension.     Palpations: Abdomen is soft. There is no mass.     Tenderness: There is no abdominal tenderness. There is no right CVA tenderness, left CVA tenderness, guarding or rebound.     Hernia: No hernia is present.  Musculoskeletal:        General: No swelling, tenderness or deformity. Normal range of motion.     Cervical back: Normal range of motion and neck supple. No rigidity or tenderness.     Right lower leg: No edema.     Left lower leg: No edema.  Lymphadenopathy:     Cervical: No cervical adenopathy.  Skin:    General: Skin is warm and dry.     Coloration: Skin is not jaundiced or pale.     Findings: No bruising, erythema, lesion or rash.  Neurological:     General: No focal deficit present.     Mental Status: She is alert and oriented to person, place, and time. Mental status is at baseline.     Cranial Nerves: No cranial nerve deficit.     Sensory: No sensory deficit.     Motor: No weakness.     Coordination: Coordination normal.     Gait: Gait normal.     Deep Tendon Reflexes: Reflexes normal.  Psychiatric:        Mood and Affect: Mood normal.  Behavior: Behavior normal.        Thought Content: Thought content normal.        Judgment: Judgment normal.     No results found.  Assessment/plan: Rita Levine is a 74 y.o. female present for CPE and chronic condition management Gastroesophageal reflux disease, unspecified whether esophagitis present Managed by GI.  They prescribed her Protonix  and Pepcid    Essential hypertension/HLD/Statin myalgia/Palpitations/aortic atherosclerosis/CAD Stable Continue Tenormin  12.5-25 mg qhs Continue hctz 12.5 mg daily PRN if notable fluid  Patient reports compliance Repatha  injection every 14 days > supplied by Dr. Varanassi.    Low-sodium diet.  Routine exercise. Labs: Collected  HLD/E66.3/diabetes screening: Last A1c 6.2>6.6>5.6>collected today Dietary modifications and routine exercise encouraged  Osteoarthritis, unspecified osteoarthritis type, unspecified site/ DDD (degenerative disc disease), lumbar/Spinal stenosis of lumbar region, unspecified whether neurogenic claudication present/Fibromyalgia Stable  continue Zanaflex    Seasonal allergies/Allergic rhinitis due to pollen, unspecified seasonality/ Multiple food allergies Continue allergy med of choice.  Vistaril   Tired & not helpful.  Continue Singulair  10 mg nightly Continue flonase   Sleep disturbance: Much improved. Continue trazodone  50 mg nightly  Situational anxiety: Stable Continue Ativan  0.5 mg twice daily as needed [place]  controlled substance database reviewed today and appropriate - Drug Monitoring Panel [phone] , Urine-10/27/2024 - Controlled substance contract dated today 10/27/2024  Asthma, intrinsic/seasonal allergies Continue management with pulmonology with supplies daily inhaler prescriptions Continue albuterol  nebulizer every 6 hours as needed Continue Pulmicort  nebulizer 0.5 mg twice daily Continue antihistamine regimen   Hypothyroidism, unspecified type/Thyroid  nodule Stable Continue Armour Thyroid  total dose 45 mg daily (30/15).  This is the only medicine called [facility]. TSH collected today - US  THYROID -improvement in size of thyroid  nodule and no further studies needed in the future.  Osteopenia, unspecified location -Vitamin D  collected today - Repeat due 07/2025, T-score -2.3 Major Osteoporotic Fracture: 15.5%  Hip Fracture: 2.0%  Ordered for 10/26 at new location-[facility]  Breast cancer screening by mammogram - MM 3D SCREENING MAMMOGRAM BILATERAL BREAST; Future  Routine general medical examination at a health care facility ([facility]) Patient was encouraged to exercise greater  than 150 minutes a week. Patient was encouraged to choose a diet filled with fresh fruits and vegetables, and lean meats. AVS provided to patient today for education/recommendation on gender specific health and safety maintenance. Colonoscopy: completed 07/2016, by jDr. Shila  resutls 10 yr per pt.  Mammogram: completed: UTD 11/19/2023 [facility]. Ordered today 2026 Immunizations: tdap -allergic, Influenza -declined (encouraged yearly), PNA series completed, zostavax/shingrix declined Infectious disease screening:  Hep C completed.  DEXA: last completed 07/2023 (-2.3), osteopenia> rpt 51yr  Return in about 24 weeks (around 04/13/2025) for Routine chronic condition follow-up.    Orders Placed This Encounter  Procedures   MM 3D SCREENING MAMMOGRAM BILATERAL BREAST   DG Bone Density   CBC   Comprehensive metabolic panel with GFR   Hemoglobin A1c   Lipid panel   TSH   Vitamin D  (25 hydroxy)   Drug Monitoring Panel [phone] , Urine    Meds ordered this encounter  Medications   ARMOUR THYROID  15 MG tablet    Sig: Take 1 tablet by mouth in the AM Once a day    Dispense:  90 tablet    Refill:  3   ARMOUR THYROID  30 MG tablet    Sig: Take 1 tablet by mouth in the AM once a day    Dispense:  90 tablet    Refill:  3  atenolol  (TENORMIN ) 25 MG tablet    Sig: Take 0.5-1 tablets (12.5-25 mg total) by mouth daily.    Dispense:  90 tablet    Refill:  1   fexofenadine  (ALLERGY RELIEF) 180 MG tablet    Sig: Take 1 tablet (180 mg total) by mouth daily.    Dispense:  90 tablet    Refill:  1   hydrochlorothiazide  (MICROZIDE ) 12.5 MG capsule    Sig: Take 1 capsule (12.5 mg total) by mouth daily as needed.    Dispense:  90 capsule    Refill:  1   montelukast  (SINGULAIR ) 10 MG tablet    Sig: TAKE 1 TABLET BY MOUTH EVERYDAY AT BEDTIME    Dispense:  90 tablet    Refill:  1   traZODone  (DESYREL ) 50 MG tablet    Sig: Take 0.5-2 tablets (25-100 mg total) by mouth at bedtime as needed for sleep.     Dispense:  180 tablet    Refill:  1   albuterol  (PROAIR  HFA) 108 (90 Base) MCG/ACT inhaler    Sig: Inhale 2 puffs into the lungs every 6 (six) hours as needed.    Dispense:  6.7 g    Refill:  5   albuterol  (PROVENTIL ) (2.5 MG/3ML) 0.083% nebulizer solution    Sig: Take 3 mLs (2.5 mg total) by nebulization every 6 (six) hours as needed.    Dispense:  75 mL    Refill:  0   budesonide  (PULMICORT ) 0.5 MG/2ML nebulizer solution    Sig: Take 2 mLs (0.5 mg total) by nebulization 2 (two) times daily.    Dispense:  360 mL    Refill:  2   fluticasone  (FLONASE ) 50 MCG/ACT nasal spray    Sig: Place 2 sprays into both nostrils daily.    Dispense:  16 mL    Refill:  11   tiZANidine  (ZANAFLEX ) 4 MG tablet    Sig: Take 1 tablet (4 mg total) by mouth every 6 (six) hours as needed for muscle spasms.    Dispense:  120 tablet    Refill:  5   LORazepam  (ATIVAN ) 0.5 MG tablet    Sig: Take 1 tablet (0.5 mg total) by mouth 2 (two) times daily as needed for anxiety.    Dispense:  30 tablet    Refill:  5    Referral Orders  No referral(s) requested today    Electronically signed by: Charlies Bellini, DO [facility]  "

## 2024-10-27 NOTE — Patient Instructions (Addendum)
 Return in about 24 weeks (around 04/13/2025) for Routine chronic condition follow-up.        Great to see you today.  I have refilled the medication(s) we provide.   If labs were collected or images ordered, we will inform you of  results once we have received them and reviewed. We will contact you either by echart message, or telephone call.  Please give ample time to the testing facility, and our office to run,  receive and review results. Please do not call inquiring of results, even if you can see them in your chart. We will contact you as soon as we are able. If it has been over 1 week since the test was completed, and you have not yet heard from us , then please call us .    - echart message- for normal results that have been seen by the patient already.   - telephone call: abnormal results or if patient has not viewed results in their echart.  If a referral to a specialist was entered for you, please call us  in 2 weeks if you have not heard from the specialist office to schedule.

## 2024-10-28 ENCOUNTER — Other Ambulatory Visit

## 2024-10-31 LAB — DRUG MONITORING PANEL 376104, URINE
Amphetamines: NEGATIVE ng/mL
Barbiturates: NEGATIVE ng/mL
Benzodiazepines: NEGATIVE ng/mL
Cocaine Metabolite: NEGATIVE ng/mL
Desmethyltramadol: NEGATIVE ng/mL
Opiates: NEGATIVE ng/mL
Oxycodone: NEGATIVE ng/mL
Tramadol: NEGATIVE ng/mL

## 2024-10-31 LAB — DM TEMPLATE

## 2024-11-01 ENCOUNTER — Ambulatory Visit: Admitting: Primary Care

## 2024-11-01 ENCOUNTER — Encounter: Payer: Self-pay | Admitting: Primary Care

## 2024-11-01 VITALS — BP 116/64 | HR 71 | Temp 97.6°F | Ht 59.0 in | Wt 132.0 lb

## 2024-11-01 DIAGNOSIS — J45909 Unspecified asthma, uncomplicated: Secondary | ICD-10-CM

## 2024-11-01 DIAGNOSIS — K219 Gastro-esophageal reflux disease without esophagitis: Secondary | ICD-10-CM | POA: Diagnosis not present

## 2024-11-01 DIAGNOSIS — J4540 Moderate persistent asthma, uncomplicated: Secondary | ICD-10-CM | POA: Diagnosis not present

## 2024-11-01 DIAGNOSIS — E041 Nontoxic single thyroid nodule: Secondary | ICD-10-CM

## 2024-11-01 MED ORDER — FLUTICASONE-SALMETEROL 250-50 MCG/ACT IN AEPB: 1.0000 | INHALATION_SPRAY | Freq: Two times a day (BID) | RESPIRATORY_TRACT | 5 refills | Status: DC

## 2024-11-01 NOTE — Progress Notes (Signed)
 "  @Patient  ID: Rita Levine, female    DOB: [birth year], 74 y.o.   MRN: [id number]  No chief complaint on file.   Referring provider: Catherine Charlies LABOR, DO  HPI: 74 year old female, never smoker followed for hx of asthma, chronic cough.  She is a patient of Dr. Chari and was last seen in office on 11/27/2023 by [facility] NP.  Past medical history significant for hypertension, aortic atherosclerosis, CAD, pulmonary hypertension, GERD, IBS, hypothyroidism, HLD, fibromyalgia, allergic rhinitis.  TEST/EVENTS:  01/15/2021 CT chest with contrast: moderate aortic atherosclerosis. Occasional coronary artery calcifications. Tiny hiatal hernia. Linear atelectasis/scar in the lingula, similar to prior exam. 3 mm pleural based nodule in the right upper lobe, likely benign. 04/28/2021 CXR 1 view: Lung volumes normal.  Pulmonary vasculature and the cardiomediastinal silhouette are within normal limits. 10/25/2021: Allergen panel negative, eos 100 05/30/2022 PFT: FVC 93, FEV1 115, ratio 94, TLC 88, DLCOcor 117. No BD 06/03/2022 CT chest w con: subcm nodes in mediastinum with no significant change. 3 mm pleural based nodule in RUL with no significant change. No acute process. Fatty infiltration of the liver. Atherosclerosis.  06/03/2023 CTA chest outside facility: no PE. B/l lung infiltrate; possible COVID pna.  12/11/2023 CT chest: no acute abnormality in the lungs. CAD/atherosclerosis  08/02/2021: OV with Dr. Darlean.  Initial consult for chronic cough since having pneumonia in 2019.  No report of dyspnea.  Was previously placed on Symbicort  80 and was also using Saba twice a day.  Cough worse with Symbicort  -suspected upper airway cough syndrome.  Recommended stopping Symbicort  on trial basis.  Aggressive treatment of GERD with Protonix  40 mg twice a day and Pepcid  20 mg in the evening.  Albuterol  and budesonide  nebs up to twice a day if needed.  6-day prednisone  taper.  09/12/2021: OV with [facility] NP for 6-week follow-up.  Did  not notice major change in her symptoms.  She did experience some shortness of breath and chest tightness after burning some trash in her yard and working in the attic earlier in the week.  Felt some improvement but had not returned to baseline.  Persistent cough with primarily clear sputum production.  Allergy symptoms uncontrolled and currently being treated by ENT.  Felt GERD symptoms improved with twice daily PPI.  Increased ICS budesonide  nebs to 0.5 mg.  Prednisone  taper.  10/25/2021: OV with [facility] NP for 6 week follow up. She continues to experience persistent cough with clear sputum production. She occasionally experiences minimal shortness of breath upon exertion and with coughing spells. She continues to have post nasal drip and difficulties with her chronic sinusitis which she is scheduled to follow up with ENT this week to discuss. She also notes occasional breakthrough reflux, despite Twice daily PPI and pepcid  nightly. She elevates the head of her bed and avoids triggering foods. She did notice some improvement in her symptoms with the prednisone  taper and reports that her albuterol  rescue inhaler does relieve her shortness of breath. FeNO nl. Persistent productive cough and minimal SOB with exertion. Suspect uncontrolled chronic sinusitis and GERD contributing. Hx of asthma - spirometry 2012 normal; no reports of wheezing. Slight improvement with prednisone . Trial triple therapy inhaler with Breztri  2 puffs Twice daily. Stop nebs. PRN albuterol . Instructed to follow up with ENT as scheduled. Advised to follow up with GI to discuss breakthrough GERD as she is already on Twice daily PPI and H2 once daily - may need EGD for further eval. Continue Singulair , Xyzal , saline nasal rinses.  PFTs ordered today. Allergen panel and CBC with diff.   05/08/2022: OV with [facility] NP for overdue follow-up.  She is still having a persistent chronic cough, which is possibly slightly worse than it was previously.  She has  not noticed a huge change..  Usually it is nonproductive but occasionally she will produce clear to yellow sputum, which is usually in the morning.  Breathing is unchanged ; will get short of breath with strenuous activity or climbing stairs.  She also has persistent hoarseness, which she feels like is worse than it has been in the past.  She has chronic sinusitis and uses multiple nasal rinses.  She was seen by ENT after her last visit but only discussed a spot on her tongue and did not discuss her hoarseness or her persistent chronic sinusitis.  Feels like her GERD symptoms are better controlled.  She denies any wheezing, increased shortness of breath, orthopnea, PND, hemoptysis, weight loss, anorexia, fatigue, dysphagia.  She was previously trialed on step up to Breztri .  Did not notice a huge difference when back to low-dose Symbicort .  Uses this with a spacer.  Never completed her PFTs because she did not want to get COVID tested again.  Her PCP has ordered a CT chest for evaluation of the previously identified small nodule. CXR with bronchitic changes - treated with doxy and prednisone .  05/30/2022: OV with [facility] NP for follow up after undergoing pulmonary function testing, which was overall normal. She reports she has been doing well since I saw her last. Still has an occasional dry cough but it is stable. Her breathing is unchanged. Unsure if the last course of prednisone  and antibiotics did much for her. She denies any recent wheezing, chest congestion, fevers, night seats, hemoptysis, anorexia, weight loss. She is on Symbicort  Twice daily. Rarely uses albuterol . She still has some issues with postnasal drainage and nasal congestion. Due to see ENT next week. She uses flonase , xyzal , and singulair . Does nasal irrigations daily. Denies any headaches or purulent drainage. She has CT chest ordered by her PCP for nodule follow up next week.   12/02/2022: Ov with [facility] NP for intended follow up; however, she is  also having some acute symptoms which started around a month ago. She has had a lot of sinus congestion with green mucus. She's been coughing more frequently, mostly dry during the day but productive in the AM with similar colored sputum. She feels like she's had more chest tightness, wheezing, and has been getting a little more winded. She denies fevers, chills, hemoptysis, leg swelling, facial tenderness She's using her rescue inhaler multiple times a week, which does help. She is still using Symbicort  twice daily, singulai, Xyzal , and nasal irrigation/sprays as directed by ENT.  04/14/2023: Ov with [facility] NP for follow up. She has been doing well since she was here last for the most part. The heat gives her a little more trouble but that's typical for this time of year. Her cough is at her baseline, primarily dry. She has some occasional voice hoarseness, which has not changed. No wheezing or chest congestion.   11/27/2023: Rita Levine with [facility] NP Patient presents today for overdue follow-up.  Unfortunately, since she was here last her husband passed away.  They both had COVID in August 2024 and that he ended up becoming very sick following this.  He did have a history of lung disease related to agent orange exposure.  He passed around a month later.  She also  had COVID pneumonia at the time but fortunately never had to be hospitalized.  Feels like she has recovered well and is back to her baseline.  Not having any issues with her cough.  Minimal with clear phlegm.  Does sometimes increase with postnasal drainage.  Breathing feels like it is at her baseline.  Not noticing any increase shortness of breath.  No wheezing or chest congestion.  No fevers, chills, hemoptysis.  Never had follow-up imaging following COVID illness.  Not having to use her rescue inhaler.  Using Wixela twice a day.  Continues on her allergy and GERD regimen, which feel well-controlled.  07/26/2024: Discussed the use of AI scribe software for  clinical note transcription with the patient, who gave verbal consent to proceed.  History of Present Illness Rita Levine is a 74 year old female with asthma who presents with increased coughing and throat constriction.  She has experienced an increase in coughing over the last few weeks. She's had a lot more sinus congestion and drainage recently. She has been using her nasal sprays/washes, and allergy regimen. She received a shot of Depomedrol last Monday from her PCP, which has provided some relief. She denies taking specific medication for the cough.   She has a history of allergy testing, including a prick test, which indicated sensitivity but no specific allergies per her report. She feels sensitive to many things despite the test outcomes and is interested in further allergy evaluation.  Her current asthma regimen includes Wixela, which she uses one puff twice a day. She uses albuterol  once or twice a month.   She reports hoarseness and sinus drainage, greenish in color when she does the flushes, and facial tenderness. No recent antibiotic use and is allergic to several antibiotics, typically using amoxicillin  or Augmentin  when necessary.  No fevers, chills, hemoptysis, wheezing.   FeNO 7 ppb   11/01/2024- Interim hx  Discussed the use of AI scribe software for clinical note transcription with the patient, who gave verbal consent to proceed.  History of Present Illness Rita Levine is a 74 year old female with moderate persistent asthma who presents for routine follow-up.  She has moderate persistent asthma and uses a maintenance inhaler,  Wixela, Singulair , Xyzal , and Flonase . She uses a budesonide  nebulizer during flare-ups and reports manageable asthma symptoms since her last visit in October. She experiences no shortness of breath, wheezing, or nocturnal coughing, and uses her rescue inhaler once a week or less.  She has a history of acid reflux, controlled with Protonix   twice daily and Pepcid  10 mg at bedtime.  She experiences persistent pain behind her ears and in her neck, despite a negative strep throat test. Currently taking 10-day course of amoxicillin  for fluid behind her eardrums, but the pain persists. She uses a nasal spray and ibuprofen  for pain management. She has a history of a thyroid  nodule and a family history of thyroid  cancer, raising concern about the neck pain. Thyroid  ultra-sound in 2022 showed 1.2cm thyroid  nodule in the left mid thyroid  gland (decreased in size compared to 2021).   She is on hydroxychloroquine  for osteoarthritis, prescribed by a sports medicine doctor. She has tried other medications like Cymbalta  in the past but could not tolerate them.  Allergies[1]  Immunization History  Administered Date(s) Administered   Influenza-Unspecified 06/11/2019, 07/14/2020   Novel Infuenza-h1n1-09 08/03/2008   Pneumococcal Conjugate-13 07/03/2016   Pneumococcal Polysaccharide-23 08/03/2019   Tdap 05/02/2014   Zoster, Live 10/14/2010    Past  Medical History:  Diagnosis Date   Abdominal bloating 03/25/2024   Acne 06/02/2020   Allergic rhinitis    Allergic rhinitis due to pollen 12/03/2019   Allergy    Anemia    Asthma    Cataract    Chest pain, unspecified 04/07/2014   Chicken pox    Colon polyp    Combined forms of age-related cataract of left eye 05/06/2024   Combined forms of age-related cataract of right eye 03/04/2024   Compression fracture of lumbar vertebra (HCC) 10/30/2023   Diverticulosis    Dysphagia    Dysrhythmia    Palpitations   Fibromyalgia    Food allergy    Gallstone    Generalized abdominal pain 02/20/2023   GERD (gastroesophageal reflux disease)    Globus sensation 03/25/2024   Head movements abnormal 10/16/2020   Hiatal hernia    Hypercholesteremia    Hypertension    Hypokalemia 06/23/2023   Hypothyroidism    IBS (irritable bowel syndrome)    Ingrown toenail 08/23/2020   Migraine headache     occasionally   Morton neuroma, 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   Multiple food allergies 12/03/2019   Osteoarthritis    Osteopenia    Pneumonia    Pneumonia due to COVID-19 virus 06/23/2023   PONV (postoperative nausea and vomiting)    Pulmonary hypertension (HCC)    pt denies, pt states she was tested for it but was not diagnosed with it   Pulmonary nodule-3 mm right upper lobe nodule-stable; no further evaluation required 04/24/2022   Rectal bleeding    Regular astigmatism of left eye 05/06/2024   Regular astigmatism of right eye 03/04/2024   SBO (small bowel obstruction) (HCC) 10/20/2017   Seasonal allergies    Seborrheic dermatitis of scalp 06/02/2020   Subconjunctival hemorrhage of left eye 08/01/2022   Thyroid  nodule    Trigger Levine of left and right ring fingers 01/11/2021    Tobacco History: Tobacco Use History[2] Counseling given: Not Answered   Outpatient Medications Prior to Visit  Medication Sig Dispense Refill   albuterol  (PROAIR  HFA) 108 (90 Base) MCG/ACT inhaler Inhale 2 puffs into the lungs every 6 (six) hours as needed. 6.7 g 5   albuterol  (PROVENTIL ) (2.5 MG/3ML) 0.083% nebulizer solution Take 3 mLs (2.5 mg total) by nebulization every 6 (six) hours as needed. 75 mL 0   amoxicillin  (AMOXIL ) 875 MG tablet Take 1 tablet (875 mg total) by mouth 2 (two) times daily for 10 days. 20 tablet 0   ARMOUR THYROID  15 MG tablet Take 1 tablet by mouth in the AM Once a day 90 tablet 3   ARMOUR THYROID  30 MG tablet Take 1 tablet by mouth in the AM once a day 90 tablet 3   Ascorbic Acid (VITAMIN C) 1000 MG tablet Take 3,000 mg by mouth daily.     atenolol  (TENORMIN ) 25 MG tablet Take 0.5-1 tablets (12.5-25 mg total) by mouth daily. 90 tablet 1   budesonide  (PULMICORT ) 0.5 MG/2ML nebulizer solution Take 2 mLs (0.5 mg total) by nebulization 2 (two) times daily. 360 mL 2   Carboxymethylcellulose Sodium (THERATEARS OP) Place 1 drop into both eyes 2 (two) times daily.     Coenzyme Q10 (COQ-10)  100 MG CAPS Take 100 mg by mouth daily.     Evolocumab  (REPATHA  SURECLICK) 140 MG/ML SOAJ Inject 140 mg into the skin every 14 (fourteen) days. 6 mL 3   famotidine  (PEPCID ) 40 MG tablet TAKE 1 TABLET BY MOUTH EVERY DAY 90 tablet 1  fexofenadine  (ALLERGY RELIEF) 180 MG tablet Take 1 tablet (180 mg total) by mouth daily. 90 tablet 1   fluticasone  (FLONASE ) 50 MCG/ACT nasal spray Place 2 sprays into both nostrils daily. 16 mL 11   fluticasone -salmeterol (WIXELA INHUB) 250-50 MCG/ACT AEPB Inhale 1 puff into the lungs 2 (two) times daily. in the morning and at bedtime. 60 each 5   folic acid (FOLVITE) 1 MG tablet SMARTSIG:2 Tablet(s) By Mouth     hydrochlorothiazide  (MICROZIDE ) 12.5 MG capsule Take 1 capsule (12.5 mg total) by mouth daily as needed. 90 capsule 1   hydroxychloroquine  (PLAQUENIL ) 200 MG tablet 1 tablet p.o. twice a day for a week then 1 tab p.o. daily 60 tablet 3   hyoscyamine  (LEVSIN ) 0.125 MG tablet Take 1 tablet (0.125 mg total) by mouth every 6 (six) hours as needed. 120 tablet 5   ipratropium (ATROVENT ) 0.06 % nasal spray Place 2 sprays into both nostrils 4 (four) times daily. 15 mL 12   LORazepam  (ATIVAN ) 0.5 MG tablet Take 1 tablet (0.5 mg total) by mouth 2 (two) times daily as needed for anxiety. 30 tablet 5   mometasone (ELOCON) 0.1 % lotion Apply 1 application topically daily as needed (ear irritation).      montelukast  (SINGULAIR ) 10 MG tablet TAKE 1 TABLET BY MOUTH EVERYDAY AT BEDTIME 90 tablet 1   ondansetron  (ZOFRAN -ODT) 4 MG disintegrating tablet TAKE 1 TABLET BY MOUTH EVERY 4 TO 6 HOURS 30 tablet 3   Probiotic CAPS Take 1 capsule by mouth daily.     RABEprazole  (ACIPHEX ) 20 MG tablet Take 1 tablet (20 mg total) by mouth daily. 90 tablet 3   tiZANidine  (ZANAFLEX ) 4 MG tablet Take 1 tablet (4 mg total) by mouth every 6 (six) hours as needed for muscle spasms. 120 tablet 5   traZODone  (DESYREL ) 50 MG tablet Take 0.5-2 tablets (25-100 mg total) by mouth at bedtime as needed  for sleep. 180 tablet 1   zinc gluconate 50 MG tablet Take 100 mg by mouth daily.     No facility-administered medications prior to visit.   Review of Systems  Review of Systems  Constitutional: Negative.   HENT:  Positive for ear pain.   Respiratory: Negative.     Physical Exam  There were no vitals taken for this visit. Physical Exam Constitutional:      General: She is not in acute distress.    Appearance: Normal appearance. She is not ill-appearing.  HENT:     Head: Normocephalic and atraumatic.     Right Ear: Hearing and ear canal normal. No drainage. Tympanic membrane is not injected or bulging.     Left Ear: Hearing and ear canal normal. No drainage. Tympanic membrane is not injected or bulging.     Ears:     Comments: Mild mid ear effusion left TM    Mouth/Throat:     Mouth: Mucous membranes are moist.     Pharynx: Oropharynx is clear.  Neck:     Comments: Palpable thyroid  nodule  Cardiovascular:     Rate and Rhythm: Normal rate and regular rhythm.  Pulmonary:     Effort: Pulmonary effort is normal.     Breath sounds: Normal breath sounds. No wheezing or rales.  Musculoskeletal:        General: Normal range of motion.  Skin:    General: Skin is warm and dry.  Neurological:     General: No focal deficit present.     Mental Status: She is  alert and oriented to person, place, and time. Mental status is at baseline.  Psychiatric:        Mood and Affect: Mood normal.        Behavior: Behavior normal.        Thought Content: Thought content normal.        Judgment: Judgment normal.     Lab Results:  CBC    Component Value Date/Time   WBC 5.3 10/27/2024 0851   RBC 4.81 10/27/2024 0851   HGB 14.1 10/27/2024 0851   HCT 41.9 10/27/2024 0851   PLT 261.0 10/27/2024 0851   MCV 87.1 10/27/2024 0851   MCH 28.3 04/28/2021 0934   MCHC 33.7 10/27/2024 0851   RDW 13.9 10/27/2024 0851   LYMPHSABS 1.6 04/29/2023 1020   MONOABS 0.7 04/29/2023 1020   EOSABS 0.1  04/29/2023 1020   BASOSABS 0.1 04/29/2023 1020    BMET    Component Value Date/Time   NA 140 10/27/2024 0851   NA 140 03/25/2024 0855   K 4.4 10/27/2024 0851   CL 101 10/27/2024 0851   CO2 34 (H) 10/27/2024 0851   GLUCOSE 100 (H) 10/27/2024 0851   BUN 11 10/27/2024 0851   BUN 8 03/25/2024 0855   CREATININE 0.74 10/27/2024 0851   CREATININE 0.71 07/26/2022 1641   CALCIUM  9.6 10/27/2024 0851   GFRNONAA >60 04/28/2021 0934    BNP No results found for: BNP  ProBNP No results found for: PROBNP  Imaging: No results found.   Assessment & Plan:   No problem-specific Assessment & Plan notes found for this encounter.   1. Intrinsic asthma, unspecified asthma severity, unspecified whether complicated, unspecified whether persistent (Primary)  Assessment and Plan Assessment & Plan Moderate persistent asthma Asthma is well-controlled with an ACT score of 24. No recent exacerbations, shortness of breath, or nocturnal symptoms. SABA use is once a week or less. Current maintenance therapy includes Wixela, Singulair , Xyzal , and Flonase . Budesonide  nebulizer is used during flare-ups. - Continue Wixela 250-50mcg one puff q 12 hr, Singulair , Xyzal , and Flonase . - Use budesonide  nebulizer twice daily during flare-ups. - Refilled Wixela prescription. - Scheduled follow-up in July.  Gastroesophageal reflux disease Symptoms are manageable with current medication regimen. She is on Protonix  twice daily and Pepcid  10 mg at bedtime. - Continue Protonix  twice daily and Pepcid  10 mg at bedtime.  Dysphagia -Following with GI   Thyroid  nodule Patient experiencing persistent pain behind her ears and in her neck, despite a negative strep throat test. Currently taking 10-day course of amoxicillin . Thyroid  ultrasound in 2022 showed 1.2cm thyroid  nodule in the left mid thyroid  gland (decreased in size compared to 2021).  - Follow-up with PCP regarding neck pain and hx thyroid   nodules  Almarie LELON Ferrari, NP 11/01/2024     [1]  Allergies Allergen Reactions   Paxlovid  [Nirmatrelvir -Ritonavir ] Shortness Of Breath and Palpitations   Tetanus Toxoid Swelling    Reports a fever and headache with swelling.    Avelox [Moxifloxacin Hcl In Nacl] Hives    Racing heart   Cephalexin Hives   Codeine Hives    Heart racing   Cymbalta  [Duloxetine  Hcl] Other (See Comments)    Increased tremors   Erythromycin Hives   Propranolol Hives   Sulfa Antibiotics Hives   Sulfamethoxazole-Trimethoprim Hives   Tramadol Hives   Duloxetine  Other (See Comments)   Flagyl [Metronidazole] Other (See Comments)    Unknown reaction, unknown severity   Statins Other (See Comments)    Myalgia   [  2]  Social History Tobacco Use  Smoking Status Never   Passive exposure: Past  Smokeless Tobacco Never   "

## 2024-11-01 NOTE — Patient Instructions (Signed)
" °  VISIT SUMMARY: Rita Levine, a 74 year old female with moderate persistent asthma, visited for a routine follow-up. Her asthma is well-controlled with her current medication regimen. She also has a history of acid reflux, which is managed with medication. She experiences persistent pain behind her ears and in her neck, despite a negative strep throat test and currently on antibiotics.   YOUR PLAN: -MODERATE PERSISTENT ASTHMA: Moderate persistent asthma is a condition where the airways in the lungs are inflamed and narrowed, causing difficulty in breathing. Your asthma is well-controlled with your current medications, including Advair, Singulair , Xyzal , and Flonase . Continue using these medications as prescribed and use the budesonide  nebulizer during flare-ups. Your Wixela prescription has been refilled. We will follow up again in July.  -GASTROESOPHAGEAL REFLUX DISEASE: Gastroesophageal reflux disease (GERD) is a condition where stomach acid frequently flows back into the tube connecting your mouth and stomach, causing irritation. Your symptoms are manageable with Protonix  twice daily and Pepcid  10 mg at bedtime. Continue taking these medications as prescribed.  INSTRUCTIONS: Please continue with your current medications for asthma and acid reflux as discussed. Use the budesonide  nebulizer during asthma flare-ups. Your Wixela prescription has been refilled. We will schedule a follow-up appointment in July to monitor your asthma. If you have any concerns or if your symptoms worsen, please contact our office.   Follow-up: 6 months with Beth NP or sooner if needed / ask PCP about thyroid  ultrasound- reach out to me if needed    "

## 2024-11-17 ENCOUNTER — Encounter: Payer: Self-pay | Admitting: Nurse Practitioner

## 2024-11-17 DIAGNOSIS — J45909 Unspecified asthma, uncomplicated: Secondary | ICD-10-CM

## 2024-11-17 MED ORDER — FLUTICASONE-SALMETEROL 250-50 MCG/ACT IN AEPB: 1.0000 | INHALATION_SPRAY | Freq: Two times a day (BID) | RESPIRATORY_TRACT | 5 refills | Status: AC

## 2024-11-19 ENCOUNTER — Ambulatory Visit: Admission: RE | Admit: 2024-11-19 | Source: Ambulatory Visit

## 2024-11-19 DIAGNOSIS — Z1231 Encounter for screening mammogram for malignant neoplasm of breast: Secondary | ICD-10-CM

## 2025-04-13 ENCOUNTER — Ambulatory Visit: Admitting: Family Medicine
